# Patient Record
Sex: Male | Born: 1940 | Race: White | Hispanic: No | Smoking: Former smoker
Health system: Southern US, Community
[De-identification: ages and names within clinical notes are randomized; demographics above are authoritative.]

## PROBLEM LIST (undated history)

## (undated) DIAGNOSIS — M1990 Unspecified osteoarthritis, unspecified site: Secondary | ICD-10-CM

## (undated) DIAGNOSIS — C3491 Malignant neoplasm of unspecified part of right bronchus or lung: Secondary | ICD-10-CM

## (undated) DIAGNOSIS — J449 Chronic obstructive pulmonary disease, unspecified: Secondary | ICD-10-CM

## (undated) DIAGNOSIS — I493 Ventricular premature depolarization: Secondary | ICD-10-CM

## (undated) DIAGNOSIS — E785 Hyperlipidemia, unspecified: Secondary | ICD-10-CM

## (undated) DIAGNOSIS — Z923 Personal history of irradiation: Secondary | ICD-10-CM

## (undated) DIAGNOSIS — I2510 Atherosclerotic heart disease of native coronary artery without angina pectoris: Secondary | ICD-10-CM

## (undated) DIAGNOSIS — I10 Essential (primary) hypertension: Secondary | ICD-10-CM

## (undated) DIAGNOSIS — K219 Gastro-esophageal reflux disease without esophagitis: Secondary | ICD-10-CM

## (undated) DIAGNOSIS — J45909 Unspecified asthma, uncomplicated: Secondary | ICD-10-CM

## (undated) DIAGNOSIS — I509 Heart failure, unspecified: Secondary | ICD-10-CM

## (undated) DIAGNOSIS — Z7189 Other specified counseling: Secondary | ICD-10-CM

## (undated) HISTORY — DX: Heart failure, unspecified: I50.9

## (undated) HISTORY — PX: EXPLORATORY LAPAROTOMY: SUR591

## (undated) HISTORY — DX: Other specified counseling: Z71.89

## (undated) HISTORY — DX: Essential (primary) hypertension: I10

## (undated) HISTORY — DX: Personal history of irradiation: Z92.3

## (undated) HISTORY — PX: HERNIA REPAIR: SHX51

## (undated) HISTORY — DX: Malignant neoplasm of unspecified part of right bronchus or lung: C34.91

## (undated) HISTORY — PX: JOINT REPLACEMENT: SHX530

## (undated) HISTORY — DX: Gastro-esophageal reflux disease without esophagitis: K21.9

## (undated) HISTORY — DX: Hyperlipidemia, unspecified: E78.5

## (undated) HISTORY — DX: Chronic obstructive pulmonary disease, unspecified: J44.9

## (undated) HISTORY — PX: EYE SURGERY: SHX253

---

## 2002-04-28 ENCOUNTER — Encounter: Admission: RE | Admit: 2002-04-28 | Discharge: 2002-04-28 | Payer: Self-pay | Admitting: Family Medicine

## 2002-04-28 ENCOUNTER — Ambulatory Visit (HOSPITAL_COMMUNITY): Admission: RE | Admit: 2002-04-28 | Discharge: 2002-04-28 | Payer: Self-pay | Admitting: Family Medicine

## 2002-06-01 ENCOUNTER — Encounter: Admission: RE | Admit: 2002-06-01 | Discharge: 2002-06-01 | Payer: Self-pay | Admitting: Family Medicine

## 2002-08-04 ENCOUNTER — Encounter: Admission: RE | Admit: 2002-08-04 | Discharge: 2002-08-04 | Payer: Self-pay | Admitting: *Deleted

## 2002-09-07 ENCOUNTER — Encounter: Admission: RE | Admit: 2002-09-07 | Discharge: 2002-09-07 | Payer: Self-pay | Admitting: Family Medicine

## 2002-09-26 ENCOUNTER — Encounter: Payer: Self-pay | Admitting: Sports Medicine

## 2002-09-26 ENCOUNTER — Encounter: Admission: RE | Admit: 2002-09-26 | Discharge: 2002-09-26 | Payer: Self-pay | Admitting: Sports Medicine

## 2002-10-11 ENCOUNTER — Encounter: Admission: RE | Admit: 2002-10-11 | Discharge: 2002-10-11 | Payer: Self-pay | Admitting: Sports Medicine

## 2002-11-01 ENCOUNTER — Encounter: Admission: RE | Admit: 2002-11-01 | Discharge: 2002-11-01 | Payer: Self-pay | Admitting: Sports Medicine

## 2002-11-29 ENCOUNTER — Encounter: Admission: RE | Admit: 2002-11-29 | Discharge: 2002-11-29 | Payer: Self-pay | Admitting: Sports Medicine

## 2002-12-28 ENCOUNTER — Encounter: Admission: RE | Admit: 2002-12-28 | Discharge: 2002-12-28 | Payer: Self-pay | Admitting: Family Medicine

## 2003-03-20 ENCOUNTER — Encounter: Admission: RE | Admit: 2003-03-20 | Discharge: 2003-03-20 | Payer: Self-pay | Admitting: Family Medicine

## 2003-04-20 ENCOUNTER — Encounter: Admission: RE | Admit: 2003-04-20 | Discharge: 2003-04-20 | Payer: Self-pay | Admitting: Sports Medicine

## 2003-06-01 ENCOUNTER — Encounter: Admission: RE | Admit: 2003-06-01 | Discharge: 2003-06-01 | Payer: Self-pay | Admitting: Family Medicine

## 2003-06-06 ENCOUNTER — Encounter: Admission: RE | Admit: 2003-06-06 | Discharge: 2003-06-06 | Payer: Self-pay | Admitting: Sports Medicine

## 2003-07-04 ENCOUNTER — Encounter: Admission: RE | Admit: 2003-07-04 | Discharge: 2003-07-04 | Payer: Self-pay | Admitting: Family Medicine

## 2003-07-28 ENCOUNTER — Encounter: Admission: RE | Admit: 2003-07-28 | Discharge: 2003-07-28 | Payer: Self-pay | Admitting: Sports Medicine

## 2003-09-18 ENCOUNTER — Encounter: Admission: RE | Admit: 2003-09-18 | Discharge: 2003-09-18 | Payer: Self-pay | Admitting: Sports Medicine

## 2004-01-03 ENCOUNTER — Ambulatory Visit: Payer: Self-pay | Admitting: Family Medicine

## 2004-03-01 ENCOUNTER — Ambulatory Visit: Payer: Self-pay | Admitting: Family Medicine

## 2004-04-16 ENCOUNTER — Ambulatory Visit: Payer: Self-pay | Admitting: Family Medicine

## 2004-05-15 ENCOUNTER — Ambulatory Visit: Payer: Self-pay | Admitting: Family Medicine

## 2004-05-29 ENCOUNTER — Ambulatory Visit: Payer: Self-pay | Admitting: Sports Medicine

## 2004-06-05 ENCOUNTER — Ambulatory Visit: Payer: Self-pay | Admitting: Family Medicine

## 2004-06-24 ENCOUNTER — Ambulatory Visit: Payer: Self-pay | Admitting: Family Medicine

## 2004-07-22 ENCOUNTER — Ambulatory Visit: Payer: Self-pay | Admitting: Family Medicine

## 2004-08-22 ENCOUNTER — Ambulatory Visit: Payer: Self-pay | Admitting: Family Medicine

## 2004-10-17 ENCOUNTER — Ambulatory Visit: Payer: Self-pay | Admitting: Family Medicine

## 2004-12-06 ENCOUNTER — Ambulatory Visit: Payer: Self-pay | Admitting: Family Medicine

## 2005-04-11 ENCOUNTER — Ambulatory Visit: Payer: Self-pay | Admitting: Family Medicine

## 2005-12-17 ENCOUNTER — Ambulatory Visit: Payer: Self-pay | Admitting: Family Medicine

## 2005-12-18 ENCOUNTER — Ambulatory Visit: Payer: Self-pay | Admitting: Sports Medicine

## 2006-02-04 ENCOUNTER — Ambulatory Visit: Payer: Self-pay | Admitting: Sports Medicine

## 2006-04-02 DIAGNOSIS — M1990 Unspecified osteoarthritis, unspecified site: Secondary | ICD-10-CM | POA: Insufficient documentation

## 2006-04-02 DIAGNOSIS — I10 Essential (primary) hypertension: Secondary | ICD-10-CM

## 2006-04-02 DIAGNOSIS — E669 Obesity, unspecified: Secondary | ICD-10-CM

## 2006-04-02 DIAGNOSIS — F5232 Male orgasmic disorder: Secondary | ICD-10-CM

## 2006-04-21 ENCOUNTER — Ambulatory Visit: Payer: Self-pay | Admitting: Family Medicine

## 2006-04-21 DIAGNOSIS — J449 Chronic obstructive pulmonary disease, unspecified: Secondary | ICD-10-CM

## 2006-04-23 ENCOUNTER — Encounter: Admission: RE | Admit: 2006-04-23 | Discharge: 2006-04-23 | Payer: Self-pay | Admitting: Sports Medicine

## 2006-04-27 ENCOUNTER — Encounter (INDEPENDENT_AMBULATORY_CARE_PROVIDER_SITE_OTHER): Payer: Self-pay | Admitting: *Deleted

## 2006-04-30 ENCOUNTER — Ambulatory Visit: Payer: Self-pay

## 2006-04-30 ENCOUNTER — Encounter (INDEPENDENT_AMBULATORY_CARE_PROVIDER_SITE_OTHER): Payer: Self-pay | Admitting: *Deleted

## 2006-05-01 ENCOUNTER — Ambulatory Visit (HOSPITAL_COMMUNITY): Admission: RE | Admit: 2006-05-01 | Discharge: 2006-05-01 | Payer: Self-pay | Admitting: *Deleted

## 2006-05-04 ENCOUNTER — Encounter (INDEPENDENT_AMBULATORY_CARE_PROVIDER_SITE_OTHER): Payer: Self-pay | Admitting: *Deleted

## 2006-05-07 ENCOUNTER — Telehealth (INDEPENDENT_AMBULATORY_CARE_PROVIDER_SITE_OTHER): Payer: Self-pay | Admitting: *Deleted

## 2006-05-14 ENCOUNTER — Telehealth (INDEPENDENT_AMBULATORY_CARE_PROVIDER_SITE_OTHER): Payer: Self-pay | Admitting: *Deleted

## 2006-06-01 ENCOUNTER — Ambulatory Visit (HOSPITAL_COMMUNITY): Admission: RE | Admit: 2006-06-01 | Discharge: 2006-06-01 | Payer: Self-pay | Admitting: Cardiovascular Disease

## 2006-09-04 ENCOUNTER — Ambulatory Visit: Payer: Self-pay | Admitting: Family Medicine

## 2006-09-04 ENCOUNTER — Encounter (INDEPENDENT_AMBULATORY_CARE_PROVIDER_SITE_OTHER): Payer: Self-pay | Admitting: *Deleted

## 2006-09-04 DIAGNOSIS — I2510 Atherosclerotic heart disease of native coronary artery without angina pectoris: Secondary | ICD-10-CM

## 2006-09-07 LAB — CONVERTED CEMR LAB
BUN: 18 mg/dL (ref 6–23)
Chloride: 96 meq/L (ref 96–112)
Sodium: 134 meq/L — ABNORMAL LOW (ref 135–145)

## 2006-09-10 ENCOUNTER — Encounter (INDEPENDENT_AMBULATORY_CARE_PROVIDER_SITE_OTHER): Payer: Self-pay | Admitting: *Deleted

## 2006-09-11 ENCOUNTER — Encounter (INDEPENDENT_AMBULATORY_CARE_PROVIDER_SITE_OTHER): Payer: Self-pay | Admitting: *Deleted

## 2006-10-19 ENCOUNTER — Inpatient Hospital Stay (HOSPITAL_COMMUNITY): Admission: RE | Admit: 2006-10-19 | Discharge: 2006-10-21 | Payer: Self-pay | Admitting: Orthopedic Surgery

## 2006-12-03 ENCOUNTER — Ambulatory Visit: Payer: Self-pay | Admitting: Family Medicine

## 2006-12-03 DIAGNOSIS — I5022 Chronic systolic (congestive) heart failure: Secondary | ICD-10-CM

## 2007-03-10 ENCOUNTER — Telehealth (INDEPENDENT_AMBULATORY_CARE_PROVIDER_SITE_OTHER): Payer: Self-pay | Admitting: *Deleted

## 2007-03-26 ENCOUNTER — Ambulatory Visit: Payer: Self-pay | Admitting: Family Medicine

## 2007-03-26 DIAGNOSIS — E782 Mixed hyperlipidemia: Secondary | ICD-10-CM

## 2007-03-29 ENCOUNTER — Encounter (INDEPENDENT_AMBULATORY_CARE_PROVIDER_SITE_OTHER): Payer: Self-pay | Admitting: *Deleted

## 2007-03-29 ENCOUNTER — Ambulatory Visit: Payer: Self-pay | Admitting: Sports Medicine

## 2007-03-29 LAB — CONVERTED CEMR LAB
ALT: 15 units/L (ref 0–53)
AST: 14 units/L (ref 0–37)
CO2: 26 meq/L (ref 19–32)
Chloride: 101 meq/L (ref 96–112)
Creatinine, Ser: 0.98 mg/dL (ref 0.40–1.50)
LDL Cholesterol: 77 mg/dL (ref 0–99)
PSA: 1.06 ng/mL (ref 0.10–4.00)
Sodium: 140 meq/L (ref 135–145)
Total Bilirubin: 0.5 mg/dL (ref 0.3–1.2)
Total CHOL/HDL Ratio: 3.5
Total Protein: 6.7 g/dL (ref 6.0–8.3)
VLDL: 24 mg/dL (ref 0–40)

## 2007-03-30 ENCOUNTER — Encounter (INDEPENDENT_AMBULATORY_CARE_PROVIDER_SITE_OTHER): Payer: Self-pay | Admitting: *Deleted

## 2007-07-20 ENCOUNTER — Ambulatory Visit: Payer: Self-pay | Admitting: Family Medicine

## 2007-08-18 ENCOUNTER — Telehealth: Payer: Self-pay | Admitting: Family Medicine

## 2007-11-30 ENCOUNTER — Telehealth (INDEPENDENT_AMBULATORY_CARE_PROVIDER_SITE_OTHER): Payer: Self-pay | Admitting: *Deleted

## 2008-01-03 ENCOUNTER — Telehealth: Payer: Self-pay | Admitting: Family Medicine

## 2008-01-03 ENCOUNTER — Ambulatory Visit: Payer: Self-pay | Admitting: Family Medicine

## 2008-01-06 ENCOUNTER — Encounter: Payer: Self-pay | Admitting: Family Medicine

## 2008-03-22 ENCOUNTER — Encounter: Payer: Self-pay | Admitting: Family Medicine

## 2008-04-28 ENCOUNTER — Telehealth: Payer: Self-pay | Admitting: *Deleted

## 2008-05-02 ENCOUNTER — Ambulatory Visit: Payer: Self-pay | Admitting: Family Medicine

## 2008-05-03 ENCOUNTER — Ambulatory Visit: Payer: Self-pay | Admitting: Family Medicine

## 2008-05-03 ENCOUNTER — Encounter: Payer: Self-pay | Admitting: Family Medicine

## 2008-05-03 LAB — CONVERTED CEMR LAB
ALT: 19 units/L (ref 0–53)
AST: 17 units/L (ref 0–37)
Albumin: 4 g/dL (ref 3.5–5.2)
Alkaline Phosphatase: 73 units/L (ref 39–117)
LDL Cholesterol: 79 mg/dL (ref 0–99)
Potassium: 4.4 meq/L (ref 3.5–5.3)
Sodium: 139 meq/L (ref 135–145)
Total Bilirubin: 0.6 mg/dL (ref 0.3–1.2)
Total Protein: 6.4 g/dL (ref 6.0–8.3)
Triglycerides: 105 mg/dL (ref <150)
VLDL: 21 mg/dL (ref 0–40)

## 2008-06-23 ENCOUNTER — Ambulatory Visit: Payer: Self-pay | Admitting: Family Medicine

## 2008-06-30 ENCOUNTER — Telehealth: Payer: Self-pay | Admitting: *Deleted

## 2008-07-19 ENCOUNTER — Telehealth (INDEPENDENT_AMBULATORY_CARE_PROVIDER_SITE_OTHER): Payer: Self-pay | Admitting: *Deleted

## 2008-07-20 ENCOUNTER — Encounter: Payer: Self-pay | Admitting: Family Medicine

## 2008-07-20 ENCOUNTER — Ambulatory Visit: Payer: Self-pay

## 2008-08-02 ENCOUNTER — Telehealth: Payer: Self-pay | Admitting: Family Medicine

## 2008-08-03 ENCOUNTER — Encounter: Payer: Self-pay | Admitting: Family Medicine

## 2008-08-29 ENCOUNTER — Ambulatory Visit: Payer: Self-pay | Admitting: Family Medicine

## 2008-09-25 ENCOUNTER — Inpatient Hospital Stay (HOSPITAL_COMMUNITY): Admission: RE | Admit: 2008-09-25 | Discharge: 2008-09-27 | Payer: Self-pay | Admitting: Orthopedic Surgery

## 2008-09-28 ENCOUNTER — Telehealth: Payer: Self-pay | Admitting: Family Medicine

## 2009-01-16 ENCOUNTER — Ambulatory Visit: Payer: Self-pay | Admitting: Family Medicine

## 2009-01-19 ENCOUNTER — Telehealth: Payer: Self-pay | Admitting: Family Medicine

## 2009-01-22 ENCOUNTER — Telehealth: Payer: Self-pay | Admitting: Family Medicine

## 2009-04-03 ENCOUNTER — Encounter: Payer: Self-pay | Admitting: Family Medicine

## 2009-10-24 ENCOUNTER — Encounter: Payer: Self-pay | Admitting: Family Medicine

## 2009-11-09 ENCOUNTER — Ambulatory Visit: Payer: Self-pay | Admitting: Family Medicine

## 2009-11-09 ENCOUNTER — Encounter: Payer: Self-pay | Admitting: Family Medicine

## 2009-11-09 LAB — CONVERTED CEMR LAB
ALT: 13 units/L (ref 0–53)
AST: 18 units/L (ref 0–37)
CO2: 26 meq/L (ref 19–32)
Chloride: 95 meq/L — ABNORMAL LOW (ref 96–112)
Creatinine, Ser: 0.77 mg/dL (ref 0.40–1.50)
HDL: 45 mg/dL (ref >39)
Indirect Bilirubin: 0.4 mg/dL (ref 0.0–0.9)
LDL Cholesterol: 64 mg/dL (ref 0–99)
Potassium: 4.1 meq/L (ref 3.5–5.3)
Sodium: 130 meq/L — ABNORMAL LOW (ref 135–145)
Total CHOL/HDL Ratio: 2.7
Triglycerides: 60 mg/dL (ref <150)

## 2009-11-12 ENCOUNTER — Encounter: Payer: Self-pay | Admitting: Family Medicine

## 2009-12-07 ENCOUNTER — Encounter: Payer: Self-pay | Admitting: Family Medicine

## 2010-01-22 ENCOUNTER — Encounter: Payer: Self-pay | Admitting: Family Medicine

## 2010-02-13 ENCOUNTER — Ambulatory Visit: Admission: RE | Admit: 2010-02-13 | Discharge: 2010-02-13 | Payer: Self-pay | Source: Home / Self Care

## 2010-02-26 ENCOUNTER — Encounter (INDEPENDENT_AMBULATORY_CARE_PROVIDER_SITE_OTHER): Payer: Self-pay | Admitting: *Deleted

## 2010-03-07 NOTE — Miscellaneous (Signed)
Summary: prior auth   Clinical Lists Changes prior auth for diclofenac to pcpp to complete.Golden Circle RN  April 03, 2009 8:34 AM

## 2010-03-07 NOTE — Miscellaneous (Signed)
Summary: handicapped placard  Patient dropped off form to be filled out for a handicapped placard.  Please call him when ready to be picked up. Bradley Meadows  January 22, 2010 10:18 AM   Handicapped Placard completed and placed in Dr. Lelon Perla box for signature.  [name]  January 22, 2010 10:30 AM  Please have patient come in for an office visit to discuss the handicapped placard.  I have some questions for him to make sure that he needs it.  Angelena Sole MD  January 31, 2010 5:22 PM   pt scheduled on 02/13/10 at 2:30

## 2010-03-07 NOTE — Miscellaneous (Signed)
   Clinical Lists Changes  Problems: Removed problem of CELLULITIS, LEG, RIGHT (ICD-682.6) Removed problem of SPECIAL SCREENING MALIGNANT NEOPLASM OF PROSTATE (ICD-V76.44) Removed problem of DUODENAL ULCER (ICD-532.90) Removed problem of ARTHRALGIA, UNSPECIFIED (ICD-719.40)

## 2010-03-07 NOTE — Assessment & Plan Note (Signed)
Summary: meds f/u,df   Vital Signs:  Patient profile:   70 year old male Height:      72 inches Weight:      193.4 pounds BMI:     26.32 Temp:     98.3 degrees F oral Pulse rate:   80 / minute BP sitting:   144 / 73  (left arm) Cuff size:   regular  Vitals Entered By: Garen Grams LPN (November 09, 2009 1:57 PM) CC: f/u meds Is Patient Diabetic? No Pain Assessment Patient in pain? no        CC:  f/u meds.  History of Present Illness: 1. HTN:  Pt is taking his medicines as prescribed.  He has been out of the Metoprolol for a couple of weeks.  ROS: denies chest pain, shortness of breath  2. Obesity:  He has been trying to lose weight and has lost 80 lbs in the past 17 months.  He has cut out sugary drinks, junk foods, and processed foods.  Trying to walk more.  3. Osteoarthritis:  He had left knee replacement recently.  It is doing well.  Able to do everything that he wants to do.  Still requiring Hydrocodone because of the pain.  4. HLD: Taking the Zocor as directed  ROS: denies leg pain or claudicatino  Habits & Providers  Alcohol-Tobacco-Diet     Tobacco Status: never     Year Quit: 2004     Pack years: 84  Current Medications (verified): 1)  Combivent 103-18 Mcg/act Aero (Albuterol-Ipratropium) .... Inhale 2 Puff Using Inhaler Four Times A Day 2)  Lisinopril-Hydrochlorothiazide 20-12.5 Mg Tabs (Lisinopril-Hydrochlorothiazide) .... Take 1 Tablet By Twice A Day 3)  Prilosec 20 Mg Cpdr (Omeprazole) .... Take 1 Capsule By Mouth Once A Day 4)  Aspir-Low 81 Mg Tbec (Aspirin) .... One Tab By Mouth Daily 5)  Metoprolol Tartrate 25 Mg  Tabs (Metoprolol Tartrate) .... One Tab By Mouth Bid 6)  Zocor 20 Mg Tabs (Simvastatin) .... One Tab By Mouth Daily 7)  Zostavax 21308 Unt/0.76ml Solr (Zoster Vaccine Live) .... Please Bring To Clinic For Injection 8)  Hydrocodone-Acetaminophen 5-500 Mg Tabs (Hydrocodone-Acetaminophen) .[name].. 1 Tab By Mouth Two Times A Day As Needed For  Pain 9)  Viagra 25 Mg Tabs (Sildenafil Citrate) .... Take 1 Tab By Mouth As Needed  Allergies: No Known Drug Allergies  Past History:  Past Medical History: Reviewed history from 01/16/2009 and no changes required. h/o scarlet fever as a child h/o smoking x 20 years.  Quit in 2004 CHF: EF 45% (07/10)  Physical Exam  General:  Vitals reviewed.  No acute distress.  Well appearing Eyes:  Fundoscopic exam benign Lungs:  normal respiratory effort, easily winded, no crackles, and no wheezes.   Heart:  normal rate and regular rhythm.   Msk:  Left knee:  good stability.  Good ROM  Right knee:  good stability.  Good ROM   Extremities:  no LEE   Impression & Recommendations:  Problem # 1:  HYPERTENSION, BENIGN SYSTEMIC (ICD-401.1) Assessment Deteriorated  Not at goal but he has been out of the Metoprolol.  Will restart that and monitor. His updated medication list for this problem includes:    Lisinopril-hydrochlorothiazide 20-12.5 Mg Tabs (Lisinopril-hydrochlorothiazide) .[name]... Take 1 tablet by twice a day    Metoprolol Tartrate 25 Mg Tabs (Metoprolol tartrate) ..... One tab by mouth bid  Orders: T-Basic Metabolic Panel [phone]) [facility]- Est  Level 4 ([address])  Problem # 2:  OBESITY, NOS (ICD-278.00) Assessment: Improved  He is now in a normal weight.  Congratulated him on his success.  Continue with the dietary and exercise changes  Orders: [facility]- Est  Level 4 ([address])  Problem # 3:  MIXED HYPERLIPIDEMIA (ICD-272.2) Assessment: Unchanged check lipids today His updated medication list for this problem includes:    Zocor 20 Mg Tabs (Simvastatin) ..... 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  Now with shoulder pain.  Vicodin per Ortho. The following medications were removed from the medication list:    Voltaren-xr 100 Mg Tb24  (Diclofenac sodium) .[name]... Take 1 tablet by mouth once a day His updated medication list for this problem includes:    Aspir-low 81 Mg Tbec (Aspirin) ..... One tab by mouth daily    Hydrocodone-acetaminophen 5-500 Mg Tabs (Hydrocodone-acetaminophen) .[name]... 1 tab by mouth two times a day as needed for pain  Orders: [facility]- Est  Level 4 ([address])  Complete Medication List: 1)  Combivent 103-18 Mcg/act Aero (Albuterol-ipratropium) .... Inhale 2 puff using inhaler four times a day 2)  Lisinopril-hydrochlorothiazide 20-12.5 Mg Tabs (Lisinopril-hydrochlorothiazide) .... Take 1 tablet by twice a day 3)  Prilosec 20 Mg Cpdr (Omeprazole) .... Take 1 capsule by mouth once a day 4)  Aspir-low 81 Mg Tbec (Aspirin) .... One tab by mouth daily 5)  Metoprolol Tartrate 25 Mg Tabs (Metoprolol tartrate) .... One tab by mouth bid 6)  Zocor 20 Mg Tabs (Simvastatin) .... One tab by mouth daily 7)  Zostavax 40347 Unt/0.17ml Solr (Zoster vaccine live) .... Please bring to clinic for injection 8)  Hydrocodone-acetaminophen 5-500 Mg Tabs (Hydrocodone-acetaminophen) .[name].. 1 tab by mouth two times a day as needed for pain 9)  Viagra 25 Mg Tabs (Sildenafil citrate) .... Take 1 tab by mouth as needed  Other Orders: T-Hepatic Function [id number]) Flu Vaccine 76yrs + [phone]) Admin 1st Vaccine (95188) Admin 1st Vaccine [facility]) [phone]) Prescriptions: METOPROLOL TARTRATE 25 MG  TABS (METOPROLOL TARTRATE) one tab by mouth BID  #180 x 3   Entered and Authorized by:   Angelena Sole MD   Signed by:   Angelena Sole MD on 11/09/2009   Method used:   Electronically to        [facility] [id number]* (retail)       [address]       [address]       [place], [place]  [address]       Ph: [phone]       Fax: [phone]   RxID:   [id number]   Prevention & Chronic Care Immunizations   Influenza vaccine: Fluvax 3+  (11/09/2009)   Influenza vaccine due: 12/03/2007    Tetanus booster: 05/05/2002: Done.    Tetanus booster due: 05/04/2012    Pneumococcal vaccine: given  (10/20/2006)   Pneumococcal vaccine due: None    H. zoster vaccine: Not documented  Colorectal Screening   Hemoccult: negative x 3  (04/29/2006)   Hemoccult due: 04/29/2007    Colonoscopy: refused  (01/03/2008)   Colonoscopy due: Refused  (01/03/2008)  Other Screening   PSA: 1.06  (03/29/2007)   PSA due due: 03/28/2008   Smoking status: never  (11/09/2009)  Lipids   Total Cholesterol: 135  (05/03/2008)   Lipid panel action/deferral: Lipid Panel ordered   LDL: 79  (05/03/2008)   LDL Direct: 119  (09/04/2006)  HDL: 35  (05/03/2008)   Triglycerides: 105  (05/03/2008)    SGOT (AST): 17  (05/03/2008)   BMP action: Ordered   SGPT (ALT): 19  (05/03/2008)   Alkaline phosphatase: 73  (05/03/2008)   Total bilirubin: 0.6  (05/03/2008)    Lipid flowsheet reviewed?: Yes   Progress toward LDL goal: Unchanged  Hypertension   Last Blood Pressure: 144 / 73  (11/09/2009)   Serum creatinine: 1.03  (05/03/2008)   BMP action: Ordered   Serum potassium 4.4  (05/03/2008)    Hypertension flowsheet reviewed?: Yes   Progress toward BP goal: Unchanged  Self-Management Support :   Personal Goals (by the next clinic visit) :      Personal blood pressure goal: 140/90  (11/09/2009)     Personal LDL goal: 130  (11/09/2009)    Hypertension self-management support: Not documented    Lipid self-management support: Not documented    Nursing Instructions: Give Flu vaccine today     Influenza Vaccine    Vaccine Type: Fluvax 3+    Site: left deltoid    Mfr: GlaxoSmithKline    Dose: 0.5 ml    Route: IM    Given by: Garen Grams LPN    Exp. Date: 07/31/2010    Lot #: [id number]    VIS given: 08/28/09 version given November 09, 2009.  Flu Vaccine Consent Questions    Do you have a history of severe allergic reactions to this vaccine? no    Any prior history of allergic reactions to egg and/or gelatin? no    Do you have  a sensitivity to the preservative Thimersol? no    Do you have a past history of Guillan-Barre Syndrome? no    Do you currently have an acute febrile illness? no    Have you ever had a severe reaction to latex? no    Vaccine information given and explained to patient? yes

## 2010-03-07 NOTE — Letter (Signed)
Summary: Generic Letter  [facility]  [address]   [place], [place] [address]   Phone: [phone]  Fax: [phone]    02/26/2010  [address] [place], [place]  [address]  Dear Bradley Meadows,  We are happy to let you know that since you are covered under Medicare you are able to have a FREE visit at the [facility] to discuss your HEALTH. This is a new benefit for Medicare.  There will be no co-payment.  At this visit you will meet with Arlys John an expert in wellness and the health coach at our clinic.  At this visit we will discuss ways to keep you healthy and feeling well.  This visit will not replace your regular doctor visit and we cannot refill medications.     You will need to plan to be here at least one hour to talk about your medical history, your current status, review all of your medications, and discuss your future plans for your health.  This information will be entered into your record for your doctor to have and review.  If you are interested in staying healthy, this type of visit can help.  Please call the office at: [phone], to schedule a "Medicare Wellness Visit".  The day of the visit you should bring in all of your medications, including any vitamins, herbs, over the counter products you take.  Make a list of all the other doctors that you see, so we know who they are. If you have any other health documents please bring them.  We look forward to helping you stay healthy.  Sincerely,   [facility]  iAWV

## 2010-03-07 NOTE — Letter (Signed)
Summary: Generic Letter  [facility]  [address]   [place], [place] [address]   Phone: [phone]  Fax: [phone]    11/12/2009  Bradley Meadows [address] [place], [place]  [address]  Dear [name],  Here is a copy of your lab results.  Everything looked pretty good.  The sodium and cholride are nothing to worry about.  Your cholesterol looked great.  Your liver enzymes were also normal  Tests: (1) Basic Metabolic Panel ([address])   Sodium               [L]  130 mEq/L                   135-145   Potassium                 4.1 mEq/L                   3.5-5.3   Chloride             [L]  95 mEq/L                    96-112   CO2                       26 mEq/L                    19-32   Glucose              [H]  100 mg/dL                   10-27   BUN                       13 mg/dL                    2-53   Creatinine                0.77 mg/dL                  0.40-1.50   Calcium                   9.9 mg/dL                   6.6-44.0  Tests: (2) Lipid Profile (34742)   Cholesterol               121 mg/dL                   5-956   Triglyceride              60 mg/dL                    <387   HDL Cholesterol           45 mg/dL                    >56   Total Chol/HDL Ratio      2.7 Ratio  VLDL Cholesterol (Calc)    12 mg/dL                    4-33  LDL Cholesterol (Calc)     64 mg/dL  0-99           Tests: (3) Liver Profile (46962)   Bilirubin, Total          0.5 mg/dL                   9.5-2.8   Bilirubin, Direct         0.1 mg/dL                   4.1-3.2   Indirect Bilirubin        0.4 mg/dL                   4.4-0.1   Alkaline Phosphatase      67 U/L                      39-117   AST/SGOT                  18 U/L                      0-37   ALT/SGPT                  13 U/L                      0-53   Total Protein             6.3 g/dL                    0.2-7.2   Albumin                   4.3 g/dL                     5.3-6.6   Sincerely,   Angelena Sole MD  Appended Document: Generic Letter mailed

## 2010-03-07 NOTE — Miscellaneous (Signed)
  Clinical Lists Changes  Problems: Changed problem from CHF, MILD (ICD-428.0) to CHRONIC SYSTOLIC HEART FAILURE (ICD-428.22) 

## 2010-03-07 NOTE — Assessment & Plan Note (Signed)
Summary: handicap placard eval/eo   Vital Signs:  Patient profile:   70 year old male Height:      72 inches Weight:      194.7 pounds BMI:     26.50 Temp:     97.7 degrees F oral Pulse rate:   73 / minute BP sitting:   122 / 67  (left arm) Cuff size:   regular  Vitals Entered By: Garen Grams LPN (February 13, 2010 2:43 PM) CC: COPD, arthritis Is Patient Diabetic? No Pain Assessment Patient in pain? no        CC:  COPD and arthritis.  History of Present Illness: 1. COPD:  Pt has long standing COPD.  It has been stable over the past couple of years.  However, over the winter it has been a little bit worse.  He has been using a wood stove to heat his house and he thinks that it has been irritating his lungs.  He has had to use his inhalers more frequently.  He does better when he is not in the house.  ROS: denies shortness of breath currently, chest pain  2. Arthritis:  He has arthritis in multiple joints.  He is s/p bilateral knee replacement.  His main problems is with his hands.  He wakes up and his hands are very stiff and sore.  He has been taking Aleve as needed.  ROS: denies any warm or swollen joints  3. CHF:  Pt was seeing Dr. Judie Grieve but has tried to switch to [facility] but it has been difficult getting an appointment over there.  He is taking his medicines as prescribed.    ROS: denies leg swelling, increased weight  Habits & Providers  Alcohol-Tobacco-Diet     Tobacco Status: never     Year Quit: 2004     Pack years: 42  Current Medications (verified): 1)  Combivent 103-18 Mcg/act Aero (Albuterol-Ipratropium) .... Inhale 2 Puff Using Inhaler Four Times A Day 2)  Lisinopril-Hydrochlorothiazide 20-12.5 Mg Tabs (Lisinopril-Hydrochlorothiazide) .... Take 1 Tablet By Twice A Day 3)  Prilosec 20 Mg Cpdr (Omeprazole) .... Take 1 Capsule By Mouth Once A Day 4)  Aspir-Low 81 Mg Tbec (Aspirin) .... One Tab By Mouth Daily 5)  Metoprolol Tartrate 25 Mg  Tabs (Metoprolol  Tartrate) .... One Tab By Mouth Bid 6)  Zocor 20 Mg Tabs (Simvastatin) .... One Tab By Mouth Daily 7)  Zostavax 81191 Unt/0.40ml Solr (Zoster Vaccine Live) .... Please Bring To Clinic For Injection 8)  Hydrocodone-Acetaminophen 5-500 Mg Tabs (Hydrocodone-Acetaminophen) .[name].. 1 Tab By Mouth Two Times A Day As Needed For Pain 9)  Viagra 25 Mg Tabs (Sildenafil Citrate) .... Take 1 Tab By Mouth As Needed 10)  Tylenol Ex St Arthritis Pain 500 Mg Tabs (Acetaminophen) .[name].. 1 Tab By Mouth Twice A Day 11)  Fish Oil 1000 Mg Caps (Omega-3 Fatty Acids) .... 2 Tabs By Mouth Twice Daily  Allergies: No Known Drug Allergies  Past History:  Past Medical History: Reviewed history from 01/16/2009 and no changes required. h/o scarlet fever as a child h/o smoking x 20 years.  Quit in 2004 CHF: EF 45% (07/10)  Family History: Reviewed history from 01/03/2008 and no changes required. Mother died at the age of 37 with COPD.  Father died at 24 with alcoholic complications.  Has 2 sisters (64,60) one with hypothyroidism.  No other significant family history.   No Hx. of Colon cancer.  Social History: Reviewed history from 01/16/2009 and no changes  required. Used to work as a Environmental education officer.  Does not work currently-Disabled.  Lives alone in [place], divorced.  3 children- one girl and two boys.  Tobacco-60 pack year history quit 03/2002.  H/o heavy Etoh-quit 20 years ago.  Physical Exam  General:  Vitals reviewed.  No acute distress.  Well appearing Neck:  no JVD Lungs:  normal respiratory effort, easily winded, no crackles, and no wheezes.   Heart:  normal rate and regular rhythm.   Msk:  Left knee:  good stability.  Good ROM Right knee:  good stability.  Good ROM Bilateral hands:  Arthritic changes in MCP and PIP joints.   Extremities:  no LEE Psych:  not anxious appearing and not depressed appearing.     Impression & Recommendations:  Problem # 1:  COPD (ICD-496) Assessment  Deteriorated  Slightly deteriorated these past couple of weeks.  Likely related to his use of the wood stove.  Advised for him to avoid direct exposure to the stove as much as possible.  Continue Combivent as needed. His updated medication list for this problem includes:    Combivent 103-18 Mcg/act Aero (Albuterol-ipratropium) ..... 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  Continue current medications. His updated medication list for this problem includes:    Lisinopril-hydrochlorothiazide 20-12.5 Mg Tabs (Lisinopril-hydrochlorothiazide) .[name]... Take 1 tablet by twice a day    Aspir-low 81 Mg Tbec (Aspirin) ..... One tab by mouth daily    Metoprolol Tartrate 25 Mg Tabs (Metoprolol tartrate) ..... 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  Advised Tylenol twice daily with Aleve for breakthrough pain.  Also advised starting a fish oil. His updated medication list for this problem includes:    Aspir-low 81 Mg Tbec (Aspirin) ..... One tab by mouth daily    Hydrocodone-acetaminophen 5-500 Mg Tabs (Hydrocodone-acetaminophen) .[name]... 1 tab by mouth two times a day as needed for pain    Tylenol Ex St Arthritis Pain 500 Mg Tabs (Acetaminophen) .[name]... 1 tab by mouth twice a day  Orders: [facility]- Est  Level 4 ([address])  Complete Medication List: 1)  Combivent 103-18 Mcg/act Aero (Albuterol-ipratropium) .... Inhale 2 puff using inhaler four times a day 2)  Lisinopril-hydrochlorothiazide 20-12.5 Mg Tabs (Lisinopril-hydrochlorothiazide) .... Take 1 tablet by twice a day 3)  Prilosec 20 Mg Cpdr (Omeprazole) .... Take 1 capsule by mouth once a day 4)  Aspir-low 81 Mg Tbec (Aspirin) .... One tab by mouth daily 5)  Metoprolol Tartrate 25 Mg  Tabs (Metoprolol tartrate) .... One tab by mouth bid 6)  Zocor 20 Mg Tabs (Simvastatin) .... One tab by mouth daily 7)  Zostavax 60454 Unt/0.10ml Solr (Zoster vaccine live) .... Please bring to clinic for injection 8)  Hydrocodone-acetaminophen 5-500 Mg Tabs (Hydrocodone-acetaminophen) .[name].. 1 tab by mouth two times a day as needed for pain 9)  Viagra 25 Mg Tabs (Sildenafil citrate) .... Take 1 tab by mouth as needed 10)  Tylenol Ex St Arthritis Pain 500 Mg Tabs (Acetaminophen) .[name].. 1 tab by mouth twice a day 11)  Fish Oil 1000 Mg Caps (Omega-3 fatty acids) .... 2 tabs by mouth twice daily  Patient Instructions: 1)  It was good to see you today  2)  I am going to recommend you take Tylenol twice a day everyday for arthritis 3)  I also want you to start taking Fish Oil (2 caps twice daily) 4)  Please schedule a follow up appointment in 6 months   Orders Added: 1)  [facility]- Est  Level 4 [[address]]

## 2010-05-11 LAB — BASIC METABOLIC PANEL
CO2: 27 mEq/L (ref 19–32)
CO2: 28 mEq/L (ref 19–32)
CO2: 28 mEq/L (ref 19–32)
Calcium: 9.5 mg/dL (ref 8.4–10.5)
Chloride: 87 mEq/L — ABNORMAL LOW (ref 96–112)
Chloride: 93 mEq/L — ABNORMAL LOW (ref 96–112)
Creatinine, Ser: 0.86 mg/dL (ref 0.4–1.5)
GFR calc Af Amer: >60 mL/min (ref >60)
GFR calc non Af Amer: >60 mL/min (ref >60)
Glucose, Bld: 115 mg/dL — ABNORMAL HIGH (ref 70–99)
Glucose, Bld: 148 mg/dL — ABNORMAL HIGH (ref 70–99)
Potassium: 3.8 mEq/L (ref 3.5–5.1)
Potassium: 4 mEq/L (ref 3.5–5.1)
Sodium: 127 mEq/L — ABNORMAL LOW (ref 135–145)
Sodium: 128 mEq/L — ABNORMAL LOW (ref 135–145)

## 2010-05-11 LAB — CBC
HCT: 27.8 % — ABNORMAL LOW (ref 39.0–52.0)
HCT: 32.5 % — ABNORMAL LOW (ref 39.0–52.0)
Hemoglobin: 11.4 g/dL — ABNORMAL LOW (ref 13.0–17.0)
Hemoglobin: 13.2 g/dL (ref 13.0–17.0)
Hemoglobin: 9.7 g/dL — ABNORMAL LOW (ref 13.0–17.0)
MCHC: 34.7 g/dL (ref 30.0–36.0)
MCHC: 34.8 g/dL (ref 30.0–36.0)
MCHC: 35 g/dL (ref 30.0–36.0)
MCV: 91.6 fL (ref 78.0–100.0)
MCV: 92.1 fL (ref 78.0–100.0)
RBC: 3.04 MIL/uL — ABNORMAL LOW (ref 4.22–5.81)
RDW: 13.7 % (ref 11.5–15.5)
RDW: 13.7 % (ref 11.5–15.5)

## 2010-05-11 LAB — URINALYSIS, ROUTINE W REFLEX MICROSCOPIC
Bilirubin Urine: NEGATIVE
Hgb urine dipstick: NEGATIVE
Protein, ur: NEGATIVE mg/dL
Urobilinogen, UA: 1 mg/dL (ref 0.0–1.0)

## 2010-05-11 LAB — DIFFERENTIAL
Basophils Relative: 1 % (ref 0–1)
Eosinophils Absolute: 0.2 10*3/uL (ref 0.0–0.7)
Neutrophils Relative %: 64 % (ref 43–77)

## 2010-05-11 LAB — PROTIME-INR
INR: 1 (ref 0.00–1.49)
Prothrombin Time: 12.9 seconds (ref 11.6–15.2)
Prothrombin Time: 16.5 seconds — ABNORMAL HIGH (ref 11.6–15.2)

## 2010-06-18 NOTE — Op Note (Signed)
NAME:  Bradley Meadows, Bradley Meadows                  ACCOUNT NO.:  [id number]   MEDICAL RECORD NO.:  [id number]          PATIENT TYPE:  INP   LOCATION:  2550                         FACILITY:  [facility]   PHYSICIAN:  Feliberto Gottron. Turner Daniels, M.D.   DATE OF BIRTH:  29-Apr-1940   DATE OF PROCEDURE:  10/19/2006  DATE OF DISCHARGE:                               OPERATIVE REPORT   PREOPERATIVE DIAGNOSIS:  End-stage arthritis right knee with 5-10  degrees varus deformity, 15 degree flexion contracture and lateral  subluxation of the femur on top of the tibia.   POSTOPERATIVE DIAGNOSIS:  End-stage arthritis right knee with 5-10  degrees varus deformity, 15 degree flexion contracture and lateral  subluxation of the femur on top of the tibia.   PROCEDURE:  Right total knee arthroplasty using DePuy sigma RP  components,  5 tibial baseplate, 4 right femur, 12.5 posterior  stabilized bearing and a 41 mm patella button.  All components cemented  with a double batch of DePuy I cement with 1500 mg Zinacef.   SURGEON:  Feliberto Gottron.  Turner Daniels, MD.   ASSISTANT:  Skip Mayer PA-C.   ANESTHETIC:  General endotracheal anesthesia.   ESTIMATED BLOOD LOSS:  Minimal.   FLUID REPLACEMENT:  800 mL crystalloid.   TOURNIQUET TIME:  One hour and 30 minutes.   DRAINS PLACED:  Foley catheter and two medium Hemovacs.   URINE OUTPUT:  300 mL.   INDICATIONS FOR PROCEDURE:  A 70 year old man with longstanding end-  stage arthritis of the right knee who has failed conservative treatment  with anti-inflammatory medicine, narcotics, physical therapy, cortisone  injections and Hyalgan injections.  He still has a severe unremitting  pain that decreases function and limits his activities.  He desires  elective total knee arthroplasty, has bone-on-bone arthritic changes as  previously described.  Risks and benefits of surgery discussed,  questions answered.   DESCRIPTION OF PROCEDURE:  The patient underwent femoral nerve block  anesthesia to the  right thigh in the preop area, was then taken to the  operative suite at [facility] where the appropriate  anesthetic monitors were attached and general endotracheal anesthesia  induced with the patient in supine position.  Tourniquet was applied  high to the right thigh, lateral post foot positioner applied to the  table.  Limb was then prepped and draped in the usual sterile fashion  from the ankle to the tourniquet.  The limb was wrapped with an Esmarch  bandage, tourniquet inflated to 350 mmHg and began the procedure by  making a standard anterior midline incision starting one handbreadth  above the patella, going over the patella 1 cm medial, 2 and 3 cm distal  to the tibial tubercle.  Small bleeders in skin and subcutaneous tissue  identified and cauterized.  Transverse retinaculum was incised in line  with the skin incision and reflected medially, allowing medial  parapatellar arthrotomy, leaving a small cuff of tendon along the VMO  insertion.  The patella was everted.  The prepatellar fat pad resected.  More importantly, the superficial medial collateral ligament was  then  elevated off the proximal tibia from anterior to posterior leaving it  intact distally about 4-5 cm down the shaft of the tibia to loosen up  the medial side because of this far varus deformity.  The knee was then  hyperflexed exposing the everted patella and the huge osteophytes  medially and laterally with complete stenosis of the femoral notch.  The  stenosis and osteophytes were removed with half inch and three-quarter  inch osteotomes, greatly freeing up the knee.  With the knee hyperflexed  and the patella everted, we then entered the proximal tibia with the  DePuy step drill, followed by the IM rod set for a 0 degree cut.  This  was pinned in place, removing 2-3 mm of bone medially and a full  centimeter of bone laterally protecting posterior structures with a  lateral Homan,  posterior McHale through the notch and a posteromedial Z  retractor.  Once this had been accomplished, we further freed up the  posteromedial corner by recessing about 50% of the semimembranosus  insertion.  We then entered the distal femur 2 mm anterior to the PCL  origin with the intramedullary rod and a 5 degrees right distal femoral  cutting guide set for a 12 mm cut because of the flexion deformity.  This was pinned along the epicondylar axis and the distal femoral cut  accomplished.  We sized for a 4 right femoral component and pinned this  in 3 degrees of external rotation with the posterior referencing guide.  The chamfer cutting guide was then hammered into place, held in place  with towel clips.  Anterior, posterior and chamfer cuts were then  accomplished in order, followed by the box cut.  Again more osteophytes  were removed.  We were then able to elevate the distal femur anteriorly  with the IM rod and remove posterior osteophytes from the superior  aspect of the femoral condyles.  The patella itself was then measured at  22 mm, cutting guide set for 12 mm, we removed the posterior 10 mm of  the patella with a power saw, sized for 41 patellar trial and drilled  the trial.  At this point, the knee was once again hyperflexed exposing  the proximal tibia.  We sized for a #5 tibial baseplate.  This was  pinned in place, followed by the smokestack and then the conical  reaming, followed by the Delta fit keel punch with the bullet tip.  We  then hammered into place a 4 right femoral trial, drilled the lugs and  then performed trial reductions with a 10 mm and a 12.5 mm tibial  spacer.  The 12.5 had the best fit and fill, came to full extension and  flexed easily to 130 degrees limited by adipose tissue.  At this point,  the trial were components removed.  All bony surfaces were waterpiked  clean and dried with suction and sponges.  At the back table, a double  batch of DePuy I  polymethylmethacrylate cement with 1500 mg of Zinacef  was then mixed and applied to all mating surfaces except for the  posterior condyles of the femur itself.  In order, we hammered into  place a #5 tibial baseplate and removed excess cement, a 4 right femur  and removed excess cement and then snapped in the 12.5 rotating platform  bearing and squeezed into place the 41 mm patellar button and again  removed excess cement.  While the cement was curing, the  knee was held  in extension with axial pressure.  Medium Hemovac drains were placed  deep in the wound after the cement had cured.  The clamp was removed  from the patella.  The knee was taken through range of motion.  No  femoral pressure was needed for patellar tracking and the wound was once  again irrigated out with normal saline solution and pulse lavage.  We  then closed the parapatellar arthrotomy with running #1 Vicryl suture,  the subcutaneous tissue with 0 and 2-0 undyed Vicryl suture and the skin  with skin staples.  A dressing of Xeroform, 4x4 dressing sponges, Webril  and Ace wrap applied.  Tourniquet let down.  The patient awakened and  taken to the recovery room without difficulty.      Feliberto Gottron. Turner Daniels, M.D.  Electronically Signed     FJR/MEDQ  D:  10/19/2006  T:  10/19/2006  Job:  [id number]

## 2010-06-18 NOTE — Op Note (Signed)
Bradley Meadows, Bradley Meadows                  ACCOUNT NO.:  [id number]   MEDICAL RECORD NO.:  [id number]          PATIENT TYPE:  INP   LOCATION:  5014                         FACILITY:  [facility]   PHYSICIAN:  Feliberto Gottron. Turner Daniels, M.D.   DATE OF BIRTH:  1940/05/14   DATE OF PROCEDURE:  09/25/2008  DATE OF DISCHARGE:                               OPERATIVE REPORT   PREOPERATIVE DIAGNOSIS:  End-stage arthritis of the left knee.   POSTOPERATIVE DIAGNOSIS:  End-stage arthritis of the left knee.   PROCEDURE:  Left total knee arthroplasty using DePuy Sigma RP  components, 5 tibia, 5 femur, 12-mm spacer, 41 button.  All components  cemented with double batch of DePuy HV cement with 1500 mg of Zinacef.   SURGEON:  Feliberto Gottron. Turner Daniels, MD   FIRST ASSISTANT:  Shirl Harris, PA-C   ANESTHESIA:  General endotracheal.   ESTIMATED BLOOD LOSS:  Minimal.   FLUID REPLACEMENT:  1500 mL of crystalloid.   DRAINS PLACED:  1. Foley catheter.  2. Two medium Hemovacs.   URINE OUTPUT:  300 mL.   TOURNIQUET TIME:  1 hour and 30 minutes.   INDICATIONS FOR PROCEDURE:  A 70 year old gentleman had a very  successful right total knee a year ago and now desires same for the left  for his down to bone-on-bone along the medial side with severe  unremitting pain that he has failed conservative treatment with anti-  inflammatory medicines, physical therapy, cortisone injections,  viscosupplementation, and judicious use of narcotics.  Risks and  benefits of surgery have been discussed and are well known to the  patient.   DESCRIPTION OF PROCEDURE:  The patient identified by armband and  received preoperative IV antibiotics in the holding area at [facility] followed by left femoral nerve block.  He was then taken to  operating room #15.  Appropriate anesthetic monitors were attached, and  general endotracheal anesthesia induced with the patient in the supine  position and a Foley catheter was inserted.  Tourniquet  applied high of  the left thigh.  Lateral post and foot positioner applied, and the left  lower extremity prepped and draped in the usual sterile fashion from the  ankle to the midthigh.  Limb wrapped with an Esmarch bandage, tourniquet  inflated to 350 mmHg and a time-out procedure was performed.  We then  began the operation by making an anterior midline incision centered over  the patella 18 cm in length through the skin and subcutaneous tissue  down to the level of the transverse retinaculum, which was reflected  medially allowing a medial parapatellar arthrotomy.  The patella was  everted.  Prepatellar fat pad resected.  Along the medial side, we  elevated the superficial medial collateral ligament from anterior to  posterior leaving intact distally, performing a release of the  superficial medial collateral ligament.  With the patella everted, the  knee was then hyperflexed exposing peripheral osteophytes, notch  osteophytes, and the anterior half of the cruciate of the menisci which  were then resected with the electrocautery.  The cruciate ligaments were  also resected.  Notch osteophytes and peripheral osteophytes removed  with an osteotome allowing [place] to hyperflex the knee.  We then placed a  posteromedial Z-retractor, a Veterinary surgeon through the notch and a  lateral Hohmann retractor and entered the proximal tibia with a DePuy  step drill followed by the intramedullary rod and a 2-degree posterior  slope cutting guide set to allow resection of 3-4 mm of bone medially  and 8-9 mm of bone laterally.  This was accomplished with the  oscillating saw.  We then entered the distal femur 2 mm anterior to the  PCL origin with a step drill followed by the intramedullary rod set for  a 5-degree left distal femoral cut and an 11 mm.  The guide was pinned  along the epicondylar axis.  The distal femoral cut accomplished with  the oscillating saw and then using the posterior referencing  cutting  guide, we measured for a 5 left femoral component.  This was pinned into  place in 3 degrees of external rotation followed by the chamfer cutting  guide and the anterior, posterior, and chamfer cuts accomplished without  difficulty followed by the Sigma RP box cut.  We then measured the  everted patella 26 mm.  We used a 41 button on the other side of the  cutting guide, was set at 15 and the posterior 10-11 mm of the patella  resected.  We sized for 41 button and drilled the patella.  With the  knee hyperflexed, we sized for #5 tibial baseplate.  This was pinned in  place followed by the smokestack and the conical reamer and the Delta  fin-keel punch.  We then hammered into place a 5 left femoral trial,  drilled the lugs, inserted 10 and 12 mm bearings, the 12 had the best  fit and fill and ligamentous stability, came into full extension, and  easily flexed to 140 degrees.  The trial patella tracked with no thumb  pressure.  At this point, all trial components were removed and the bony  surfaces were Water-Pik, cleaned and dried with suction and sponges.  A  double batch of DePuy HV cement with 1500 mg of Zinacef was then mixed  at the back stable and applied to all bony metallic mating surfaces.  In  order, we hammered in place a 5 tibial baseplate and removed excess  cement; a 5 left femoral component, removed excess cement; snapped in a  12-mm bearing and reduced the knee and held in extension with  compression as the cement cured.  The patellar button was squeezed into  place and held with a clamp and again excess cement was removed.  Medium  Hemovac drains were placed from the anterolateral approach.  The wound  irrigated out one more time with normal saline solution.  We checked our  patellar tracking one more time and then closed the parapatellar  arthrotomy with running #1 Vicryl suture, the subcutaneous tissue with 0  and 2-0 undyed Vicryl suture, and the skin with skin  staples.  A  dressing of Xeroform, 4 x 4 dressing sponges, Webril, and Ace wrap was  then applied.  The tourniquet was let down.  The patient was awakened  and taken to the recovery room without difficulty.      Feliberto Gottron. Turner Daniels, M.D.  Electronically Signed     FJR/MEDQ  D:  09/25/2008  T:  09/26/2008  Job:  [id number]

## 2010-06-21 NOTE — Discharge Summary (Signed)
NAME:  Bradley Meadows, Bradley Meadows                  ACCOUNT NO.:  [id number]   MEDICAL RECORD NO.:  [id number]          PATIENT TYPE:  INP   LOCATION:  5001                         FACILITY:  [facility]   PHYSICIAN:  Feliberto Gottron. Turner Daniels, M.D.   DATE OF BIRTH:  Dec 31, 1940   DATE OF ADMISSION:  10/19/2006  DATE OF DISCHARGE:  10/21/2006                               DISCHARGE SUMMARY   DIAGNOSIS:  End-stage degenerative joint disease right knee.   PROCEDURE WHILE IN HOSPITAL:  Right total knee arthroplasty.   DISCHARGE SUMMARY:  The patient is a 70 year old male with a long-  standing end-stage arthritis of the right knee who has failed  conservative treatment with anti-inflammatory medication, narcotics,  physical therapy, cortisone injections, and Hyalgan series.  He still  has unremitting pain that is severe at times and decreases his function,  limits his activities.  He desires elective total knee arthroplasty.  There have been arthritic changes as described.  Risks and benefits of  the surgery were discussed.  Questions were answered.   NO KNOWN DRUG ALLERGIES.   MEDICATIONS AT TIME OF ADMISSION:  Metoprolol, diclofenac, lisinopril,  omeprazole, simvastatin, baby aspirin, Flovent, and Combivent.   PAST MEDICAL HISTORY:  Usual childhood diseases.  Adult history:  1. Hypertension.  2. CAD.  3. DJD.  4. GERD.  5. Reactive airway disease.   PAST SURGICAL HISTORY:  1. Hernia repair.  2. Ulcer repair.  3. Noted difficulties of DJD.   SOCIAL HISTORY:  No tobacco.  No ethanol.  No IV drug abuse.  He is  divorced, retired, lives alone.   FAMILY HISTORY:  Mother died at age 37, history of COPD.  Father died in  his 69s, history of ethanol abuse.   REVIEW OF SYSTEMS:  Positive for glasses, upper and lower dentures,  shortness of breath with exertion.  He denies any chest pain or recent  illness.   EXAMINATION:  VITAL SIGNS:  Temperature 97.8, pulse 73, blood pressure  136/85.  He is 73 inches tall,  251 pounds.  HEAD:  Normocephalic, atraumatic.  EARS:  TMs are clear.  EYES:  Pupils equally round and reactive to light and accommodation.  NOSE:  Benign.  THROAT:  Patent.  NECK:  Supple.  Full range of motion.  CHEST:  Clear to auscultation and percussion.  HEART:  Regular rate and rhythm.  ABDOMEN:  Soft, nontender.  No masses.  EXTREMITIES:  Right knee range of motion 10 to 100 degrees with positive  crepitus, positive effusion, and pain to all ranges of motion.  SKIN:  Within normal limits.   X-ray showed limited changes of DJD of the right knee.   Preoperative labs including CBC, C-Met, chest x-ray, EKG, PT, and PTT  were all within normal limits with the exception of a hematocrit of  38.3.   HOSPITAL COURSE:  On the day of admission, the patient was taken to the  operating room at [facility] where he underwent a right total knee  arthroplasty using DePuy Sigma RP components.  A #5 tibia base plate, a  #4 right  femur, a 12.5 posterior stabilizing bearing, and a 41 mm  patellar button.  All components cemented with double batch methyl  methacrylate cement, DePuy 1 type with 1500 mg set and embedded.  Foley  catheter was placed preoperatively.  A mini HemoVac was placed up onto  the knee perioperative.  The patient was placed on postoperative  Coumadin prophylaxis with a target INR of 1.52 with bridging Lovenox  therapy per pharmacy.  He was placed on a PCA Dilaudid pump for pain  control.  He was placed on perioperative antibiotics for the first 48-  hours.  Physical therapy was begun with CPN in the PACU.  Postoperative  day 1, the patient was awake and alert, taking p.o.'s well.  No nausea.  Vital signs showed a temperature max of 100.1, ranging 99.  He was  neurovascularly intact.  Urine output 475 mL since surgery, and  discontinued on postoperative day 1.  Hemoglobin 10.9, WBC 15.1.  INR  1.1.  Urine output of 450 every shift.  He had no signs or symptoms of  anemia  despite his drop in hemoglobin.  Postoperative day 2, the patient  was without complaint, he was out of bed into the room independently,  transferring without difficulty.  T-max 100, ranging 99 current.  Hemoglobin 10.2, WBC dropped to 13.6.  INR 1.4.  Dressing was dry.  Wound was benign.  Calf was soft.  He passed his PT goals in the  afternoon session.  He was discharged home in the care of his friends.   Diet is regular.  Activity is weightbearing as tolerated with walker,  total knee precautions.   MEDICATIONS:  Coumadin per [facility] home [facility], as well as Tylox, Robaxin.  Home med rec sheet signed and discussed.   Dressing changes every day or p.r.n.  Return to clinic in approximately  1 week with Dr. Turner Daniels.  Diet is regular.   DIAGNOSIS FOR THIS ADMISSION:  End-stage DJD of the right knee.   PROCEDURE WHILE IN HOSPITAL:  Right total knee arthroplasty.      Laural Benes. Jannet Mantis.      Feliberto Gottron. Turner Daniels, M.D.  Electronically Signed    JBR/MEDQ  D:  12/21/2006  T:  12/21/2006  Job:  [id number]

## 2010-06-21 NOTE — H&P (Signed)
Bradley Meadows, Bradley Meadows                  ACCOUNT NO.:  [id number]   MEDICAL RECORD NO.:  [id number]          PATIENT TYPE:  OIB   LOCATION:  2854                         FACILITY:  [facility]   PHYSICIAN:  Ricki Rodriguez, M.D.  DATE OF BIRTH:  26-Oct-1940   DATE OF ADMISSION:  06/01/2006  DATE OF DISCHARGE:  06/01/2006                              HISTORY & PHYSICAL   HOSPITAL LOCATION:  Outpatient.   HISTORY:  Abnormal stress test.   HISTORY OF PRESENT ILLNESS:  This 70 year old white male had a nuclear  stress test that was abnormal.  The patient needed clearance for knee  surgery.   PAST MEDICAL HISTORY:  Negative for diabetes, positive for hypertension.  The patient quit smoking 4 years ago.  Also, quit alcohol 15 years ago.  Has positive history of elevated cholesterol level and obesity.  Negative history of myocardial infarction.   FAMILY HISTORY:  No family history of premature coronary artery disease.   PAST SURGICAL HISTORY:  1. Stomach surgery x2.  2. Abscess drainage on the spine 34 years ago.   MEDICATIONS:  Blood pressure medicine.  The patient does not remember  the name.   ALLERGIES:  NONE.   PERSONAL HISTORY:  The patient is divorced x20 years.  Here with sister,  Marlou Porch, who is a 70 year old.   FAMILY HISTORY:  Mother died of pneumonia.  Father died of alcoholism at  age 37.  The patient does not have any brother, has 2 sisters - 65 and  58 years old.   REVIEW OF SYSTEMS:  Positive for weight loss with voluntary dieting for  3 weeks.  The patient wears glasses, has full upper and lower dentures,  has history of asthma, leg edema, and joint pains.  No history of  hearing loss, cough, hemoptysis, COPD, pneumonia, dyspnea, palpitation,  dizziness, chest pain, nausea, vomiting, diarrhea, constipation, GI  bleed, hepatitis, blood transfusion, kidney stone, stroke, seizures, or  psychiatric admissions.  The patient had flu shot in November 2007.   PHYSICAL  EXAMINATION:  VITAL SIGNS:  Pulse 86, respirations 18, blood  pressure 146/85, temperature 98, height 6 feet 1 inch, weight 247  pounds.  GENERAL:  The patient is alert and oriented x3.  HEAD:  Normocephalic, atraumatic.  EYES:  Manson Passey with patient wearing glasses.  EAR, NOSE, AND THROAT:  Reveal mucous membranes pink and moist.  NECK:  No JVD, no carotid bruits.  LUNGS:  Clear bilaterally.  HEART:  Normal S1 and S2.  ABDOMEN:  Distended but nontender.  EXTREMITIES:  Positive varicose veins and negative edema, cyanosis, or  clubbing.  CNS:  The patient moves all 4 extremities.  Cranial nerves grossly  intact.   LABORATORY DATA:  Revealed near-normal electrolytes, BUN, and  creatinine.  Glucose borderline at 119.  Hemoglobin and hematocrit, WBC  count, and platelet count normal.   EKG:  Normal sinus rhythm with left anterior hemiblock.   IMPRESSION:  1. Abnormal stress test, rule out coronary artery disease.  2. Hypertension.  3. Obesity.   PLAN:  The patient is to undergo a  cardiac catheterization to rule out  coronary artery disease.      Ricki Rodriguez, M.D.  Electronically Signed     ASK/MEDQ  D:  08/19/2006  T:  08/20/2006  Job:  [id number]

## 2010-06-21 NOTE — Cardiovascular Report (Signed)
NAME:  Bradley Meadows, Bradley Meadows                  ACCOUNT NO.:  [id number]   MEDICAL RECORD NO.:  [id number]          PATIENT TYPE:  OIB   LOCATION:  2854                         FACILITY:  [facility]   PHYSICIAN:  Ricki Rodriguez, M.D.  DATE OF BIRTH:  1940/06/15   DATE OF PROCEDURE:  06/01/2006  DATE OF DISCHARGE:                            CARDIAC CATHETERIZATION   PROCEDURE DONE BY:  Ricki Rodriguez, M.D.   HOSPITAL LOCATION:  Outpatient Section C.   Left heart catheterization, selective coronary angiography, left  ventricular function study.   INDICATION:  This 70 year old white male had abnormal stress test along  with cardiac risk factors of hypertension and elevated cholesterol  level.   APPROACH:  Right femoral artery using 4-French sheath.   COMPLICATIONS:  None.   HEMODYNAMIC DATA:  The left ventricular pressure was 133/11 and aortic  pressure was 133/73.   CORONARY ANATOMY:  The left main coronary artery was unremarkable.   LEFT ANTERIOR DESCENDING CORONARY ARTERY:  The left anterior descending  coronary artery showed luminal irregularities with a mid vessel 40-50%  disease.  Distal vessel wrapped around the apex of the heart and had  minimal disease.  Diagonal 1 was a large vessel with luminal  irregularities in the proximal portion.   LEFT CIRCUMFLEX CORONARY ARTERY:  The left circumflex coronary artery  showed proximal 20% stenosis followed by 100% occlusion.  The ramus  branch was unremarkable.  The distal OM filled via collateral from LAD.   RIGHT CORONARY ARTERY:  The right coronary artery was dominant, had  proximal 40% and mid vessel 30% x2 lesions.  The distal vessel showed  luminal irregularity.  The posterolateral branch and posterior  descending coronary artery were unremarkable.   LEFT VENTRICULOGRAM:  The left ventriculogram showed preserved left  ventricular systolic function with an ejection fraction of 60%.   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  2. Preserved left ventricular systolic function.   RECOMMENDATIONS:  This patient will continue medical therapy.      Ricki Rodriguez, M.D.  Electronically Signed     ASK/MEDQ  D:  06/01/2006  T:  06/01/2006  Job:  [id number]

## 2010-06-24 ENCOUNTER — Other Ambulatory Visit: Payer: Self-pay | Admitting: Family Medicine

## 2010-06-25 NOTE — Telephone Encounter (Signed)
Refill request

## 2010-07-01 ENCOUNTER — Other Ambulatory Visit: Payer: Self-pay | Admitting: Family Medicine

## 2010-07-02 NOTE — Telephone Encounter (Signed)
Refill request

## 2010-07-04 NOTE — Telephone Encounter (Signed)
2nd refill request

## 2010-07-05 NOTE — Telephone Encounter (Signed)
Refill request

## 2010-08-29 ENCOUNTER — Ambulatory Visit (INDEPENDENT_AMBULATORY_CARE_PROVIDER_SITE_OTHER): Payer: Medicaid Other | Admitting: Family Medicine

## 2010-08-29 ENCOUNTER — Encounter: Payer: Self-pay | Admitting: Family Medicine

## 2010-08-29 VITALS — BP 122/70 | HR 70 | Temp 97.6°F | Wt 196.0 lb

## 2010-08-29 DIAGNOSIS — I10 Essential (primary) hypertension: Secondary | ICD-10-CM

## 2010-08-29 DIAGNOSIS — E782 Mixed hyperlipidemia: Secondary | ICD-10-CM

## 2010-08-29 DIAGNOSIS — I2510 Atherosclerotic heart disease of native coronary artery without angina pectoris: Secondary | ICD-10-CM

## 2010-08-29 DIAGNOSIS — F5232 Male orgasmic disorder: Secondary | ICD-10-CM

## 2010-08-29 DIAGNOSIS — R21 Rash and other nonspecific skin eruption: Secondary | ICD-10-CM

## 2010-08-29 DIAGNOSIS — M1990 Unspecified osteoarthritis, unspecified site: Secondary | ICD-10-CM

## 2010-08-29 DIAGNOSIS — I5022 Chronic systolic (congestive) heart failure: Secondary | ICD-10-CM

## 2010-08-29 DIAGNOSIS — F17200 Nicotine dependence, unspecified, uncomplicated: Secondary | ICD-10-CM

## 2010-08-29 DIAGNOSIS — Z720 Tobacco use: Secondary | ICD-10-CM | POA: Insufficient documentation

## 2010-08-29 DIAGNOSIS — J449 Chronic obstructive pulmonary disease, unspecified: Secondary | ICD-10-CM

## 2010-08-29 MED ORDER — IPRATROPIUM-ALBUTEROL 18-103 MCG/ACT IN AERO: 2.0000 | INHALATION_SPRAY | Freq: Four times a day (QID) | RESPIRATORY_TRACT | Status: DC

## 2010-08-29 MED ORDER — SILDENAFIL CITRATE 25 MG PO TABS: 25.0000 mg | ORAL_TABLET | Freq: Every day | ORAL | Status: DC | PRN

## 2010-08-29 MED ORDER — METOPROLOL TARTRATE 25 MG PO TABS: 25.0000 mg | ORAL_TABLET | Freq: Two times a day (BID) | ORAL | Status: DC

## 2010-08-29 MED ORDER — OMEPRAZOLE 20 MG PO CPDR: 20.0000 mg | DELAYED_RELEASE_CAPSULE | Freq: Every day | ORAL | Status: DC

## 2010-08-29 MED ORDER — DICLOFENAC SODIUM ER 100 MG PO TB24: 100.0000 mg | ORAL_TABLET | Freq: Every day | ORAL | Status: DC

## 2010-08-29 MED ORDER — HYDROCODONE-ACETAMINOPHEN 5-500 MG PO TABS: 1.0000 | ORAL_TABLET | Freq: Two times a day (BID) | ORAL | Status: DC | PRN

## 2010-08-29 MED ORDER — ASPIRIN 81 MG PO TABS: 81.0000 mg | ORAL_TABLET | Freq: Every day | ORAL | Status: DC

## 2010-08-29 MED ORDER — LISINOPRIL-HYDROCHLOROTHIAZIDE 20-12.5 MG PO TABS: 1.0000 | ORAL_TABLET | Freq: Every day | ORAL | Status: DC

## 2010-08-29 NOTE — Assessment & Plan Note (Signed)
Stable. Advised smoking cessation. Just started briefly past few days.

## 2010-08-29 NOTE — Assessment & Plan Note (Signed)
Unchanged. Asymptomatic. No chest pain symptoms. Continue baby aspirin.

## 2010-08-29 NOTE — Assessment & Plan Note (Addendum)
I think this is eczema. I recommend conservative treatment for now with Eucerin and OTC hydrocortisone since it has been improving with OTC hydrocortisone. If this worsens or does not improve, consider other papulosquamous disease (pityriasis, lichen planus, guttate psoriasis).

## 2010-08-29 NOTE — Assessment & Plan Note (Signed)
Will refill Viagra 

## 2010-08-29 NOTE — Assessment & Plan Note (Signed)
Will check LDL today.

## 2010-08-29 NOTE — Assessment & Plan Note (Signed)
Improved. BP good today. Continue current medications.

## 2010-08-29 NOTE — Assessment & Plan Note (Signed)
Deteriorated. Encouraged cessation.

## 2010-08-29 NOTE — Assessment & Plan Note (Addendum)
Unchanged or may be slightly worsening. Still with good ROM. Encouraged to use arm to prevent frozen shoulder. Continue current medications.

## 2010-08-29 NOTE — Patient Instructions (Addendum)
It was nice to meet you today.   If your lab results are normal, I will send you a letter with the results. If abnormal, someone at the clinic will get in touch with you.   I hope you decide not to smoke any more!  Please follow-up in 3 months for medication refills and to follow-up on your medical problems.   For your rash, try a thick lotion like Eucerin lotion at the grocery store. If this doesn't work, then try an over-the-counter hydrocortisone cream. Call the clinic and let me know if the rash doesn't improve in the next 1-2 weeks with this treatment.

## 2010-08-29 NOTE — Progress Notes (Signed)
  Subjective:    Patient ID: Bradley Meadows, male    DOB: September 17, 1940, 70 y.o.   MRN: [id number]  HPI 1. HLD Compliant with medication. ROS: denies RUQ pain, myalgias  2. HTN Compliant with medications. ROS: denies light-headedness, chest pain  3. CAD, chronic systolic HF (EF 45%) Asymptomatic. Hasn't seen Cardiologist for a while. Had several stress test in the past.  Told there is a blockage in one of his vessels. Has never had surgery on heart.  ROS: denies palpatations, SOB  4. COPD Quit smoking in 2004 but started smoking again recently.  Girlfriend left package of cigarettes. Is trying to stop. Knows he should.   5. Osteoarthritis Right shoulder still bothering him. Used to be in [employer] and used that arm a lot.  Medications help.  Does not take pain medications daily. Just as needed. Never more than 2 Vicodin in a day.  Denies limitations in daily function. Able to left arm okay if he manipulates it a certain way.  6. Skin rash Started a few weeks ago. Had it several years ago. Cream prescribed to him helped. Tried sister's steroid cream. Think it helped.  Review of Systems    Objective:   Physical Exam General: pleasant, NAD, well-groomed Psych: not agitated/depressed, appropriate to questions, cognitively intact CV: decreased HS, no clear murmur auscultated Pulm: occasional expiratory wheeze, no increased WOB, no rales Abd: soft, non-tender Ext: no edema  Skin: 4x4cm flat, dry, well-demarcated, brightly erythematous rash above right medial malleolus, non-scaling    Assessment & Plan:

## 2010-08-30 ENCOUNTER — Encounter: Payer: Self-pay | Admitting: Family Medicine

## 2010-08-30 LAB — COMPREHENSIVE METABOLIC PANEL
Albumin: 4.1 g/dL (ref 3.5–5.2)
BUN: 11 mg/dL (ref 6–23)
CO2: 27 mEq/L (ref 19–32)
Calcium: 9.7 mg/dL (ref 8.4–10.5)
Chloride: 95 mEq/L — ABNORMAL LOW (ref 96–112)
Glucose, Bld: 94 mg/dL (ref 70–99)
Potassium: 3.8 mEq/L (ref 3.5–5.3)
Total Protein: 6.2 g/dL (ref 6.0–8.3)

## 2010-09-11 ENCOUNTER — Encounter: Payer: Self-pay | Admitting: Home Health Services

## 2010-09-11 ENCOUNTER — Ambulatory Visit (INDEPENDENT_AMBULATORY_CARE_PROVIDER_SITE_OTHER): Payer: Medicare Other | Admitting: Home Health Services

## 2010-09-11 VITALS — BP 115/75 | HR 61 | Temp 98.4°F | Ht 70.0 in | Wt 198.8 lb

## 2010-09-11 DIAGNOSIS — Z0000 Encounter for general adult medical examination without abnormal findings: Secondary | ICD-10-CM

## 2010-09-11 NOTE — Patient Instructions (Signed)
1. Continue to work on losing 8 pounds. 2. Focus on eating 3-4 vegetables a day. 3. Continue to move more every day. 4. Consider getting a colonoscopy. 5. Bring in copy of shingles vaccine. 6. Set a quit smoking date.

## 2010-09-11 NOTE — Progress Notes (Signed)
Patient here for annual wellness visit, patient reports: Risk Factors/Conditions needing evaluation or treatment: Pt does not have any risk factors that need evaluation. Home Safety: Pt lives by self in 1 story home.  Pt reports having smoke detectors and does not have adaptive equipment in bathroom. Other Information: Corrective lens: Pt wears daily corrective lens and visits eye doctor annually. Dentures: Pt has full dentures. Memory: Pt denies memory problems. Patient's Mini Mental Score (recorded in doc. flowsheet): 28  Balance/Gait: Pt does not have any noticeable impairments but has had both knees replaced.  Balance Abnormal Patient value  Sitting balance    Sit to stand    Attempts to arise    Immediate standing balance    Standing balance    Nudge    Eyes closed- Romberg    Tandem stance    Back lean    Neck Rotation    360 degree turn    Sitting down     Gait Abnormal Patient value  Initiation of gait    Step length-left    Step length-right    Step height-left    Step height-right    Step symmetry    Step continuity    Path deviation    Trunk movement    Walking stance        Annual Wellness Visit Requirements Recorded Today In  Medical, family, social history Past Medical, Family, Social History Section  Current providers Care team  Current medications Medications  Wt, BP, Ht, BMI Vital signs  Hearing assessment (welcome visit) Hearing/vision  Tobacco, alcohol, illicit drug use History  ADL Nurse Assessment  Depression Screening Nurse Assessment  Cognitive impairment Nurse Assessment  Mini Mental Status Document Flowsheet  Fall Risk Nurse Assessment  Home Safety Progress Note  End of Life Planning (welcome visit) Social Documentation  Medicare preventative services Progress Note  Risk factors/conditions needing evaluation/treatment Progress Note  Personalized health advice Patient Instructions, goals, letter  Diet & Exercise Social Documentation    Emergency Contact Social Documentation  Seat Belts Social Documentation  Sun exposure/protection Social Documentation    Medicare Prevention Plan: We dicussed colonoscopy, shingles vaccine, and ultrasound.   Recommended Medicare Prevention Screenings Men over 61 Test For Frequency Date of Last- BOLD if needed  Colorectal Cancer 1-10 yrs Declined  Prostate Cancer Never or yearly 2/09  Aortic Aneurysm Once if 65-75 with hx of smoking Under care of cardiologist  Cholesterol 5 yrs 10/11  Diabetes yearly Non diabetic  HIV yearly declined  Influenza Shot yearly 10/11  Pneumonia Shot once 9/08  Zostavax Shot once Pt reported done

## 2010-09-12 ENCOUNTER — Ambulatory Visit: Payer: Self-pay | Admitting: Home Health Services

## 2010-09-22 NOTE — Progress Notes (Signed)
  Subjective:    Patient ID: Bradley Meadows, male    DOB: 06/10/1940, 70 y.o.   MRN: [id number]  HPI    Review of Systems     Objective:   Physical Exam        Assessment & Plan:  I have reviewed this visit and discussed with Arlys John and agree with her documentation.

## 2010-10-03 ENCOUNTER — Other Ambulatory Visit: Payer: Self-pay | Admitting: Family Medicine

## 2010-10-03 NOTE — Telephone Encounter (Signed)
Refill request

## 2010-10-13 ENCOUNTER — Other Ambulatory Visit: Payer: Self-pay | Admitting: Family Medicine

## 2010-10-14 NOTE — Telephone Encounter (Signed)
Refill request

## 2010-11-14 LAB — BASIC METABOLIC PANEL
Chloride: 91 — ABNORMAL LOW
GFR calc Af Amer: 54 — ABNORMAL LOW
GFR calc non Af Amer: 44 — ABNORMAL LOW
Potassium: 4.2

## 2010-11-14 LAB — PROTIME-INR
INR: 1.1
Prothrombin Time: 14.7
Prothrombin Time: 17.5 — ABNORMAL HIGH

## 2010-11-14 LAB — CBC
HCT: 28.9 — ABNORMAL LOW
Hemoglobin: 10.9 — ABNORMAL LOW
MCV: 87.6
RBC: 3.3 — ABNORMAL LOW
RBC: 3.58 — ABNORMAL LOW
WBC: 13.6 — ABNORMAL HIGH
WBC: 15 — ABNORMAL HIGH

## 2010-11-15 LAB — URINALYSIS, ROUTINE W REFLEX MICROSCOPIC
Hgb urine dipstick: NEGATIVE
Specific Gravity, Urine: 1.017
Urobilinogen, UA: 0.2
pH: 5.5

## 2010-11-15 LAB — DIFFERENTIAL
Eosinophils Absolute: 0.2
Eosinophils Relative: 2
Lymphs Abs: 2
Monocytes Absolute: 0.7
Monocytes Relative: 9
Neutrophils Relative %: 62

## 2010-11-15 LAB — COMPREHENSIVE METABOLIC PANEL
ALT: 27
AST: 23
CO2: 27
Calcium: 9.8
Chloride: 100
GFR calc Af Amer: >60
GFR calc non Af Amer: >60
Glucose, Bld: 88
Sodium: 135
Total Bilirubin: 0.6

## 2010-11-15 LAB — CBC
Hemoglobin: 13.5
MCHC: 35.2
MCV: 87.5
RBC: 4.38
WBC: 7.6

## 2010-11-15 LAB — PROTIME-INR: INR: 1

## 2010-11-15 LAB — TYPE AND SCREEN
ABO/RH(D): A POS
Antibody Screen: NEGATIVE

## 2010-12-11 ENCOUNTER — Encounter: Payer: Self-pay | Admitting: Home Health Services

## 2010-12-28 ENCOUNTER — Other Ambulatory Visit: Payer: Self-pay | Admitting: Family Medicine

## 2010-12-29 NOTE — Telephone Encounter (Signed)
Refill request

## 2011-01-13 ENCOUNTER — Other Ambulatory Visit: Payer: Self-pay | Admitting: Family Medicine

## 2011-01-13 NOTE — Telephone Encounter (Signed)
Refill request

## 2011-01-16 NOTE — Telephone Encounter (Signed)
Patient needs appointment within month for any more refills.

## 2011-01-21 ENCOUNTER — Encounter: Payer: Self-pay | Admitting: Family Medicine

## 2011-02-06 ENCOUNTER — Other Ambulatory Visit: Payer: Self-pay | Admitting: Family Medicine

## 2011-02-07 NOTE — Telephone Encounter (Signed)
Refill request

## 2011-02-15 ENCOUNTER — Other Ambulatory Visit: Payer: Self-pay | Admitting: Family Medicine

## 2011-02-16 NOTE — Telephone Encounter (Signed)
Refill request

## 2011-02-17 NOTE — Telephone Encounter (Signed)
Please ask patient to follow-up in the next 3 months.

## 2011-03-06 ENCOUNTER — Other Ambulatory Visit: Payer: Self-pay | Admitting: Family Medicine

## 2011-03-06 DIAGNOSIS — M1990 Unspecified osteoarthritis, unspecified site: Secondary | ICD-10-CM

## 2011-04-02 ENCOUNTER — Other Ambulatory Visit: Payer: Self-pay | Admitting: Family Medicine

## 2011-04-02 NOTE — Telephone Encounter (Signed)
Refill request

## 2011-05-04 ENCOUNTER — Other Ambulatory Visit: Payer: Self-pay | Admitting: Family Medicine

## 2011-06-05 ENCOUNTER — Other Ambulatory Visit: Payer: Self-pay | Admitting: Family Medicine

## 2011-06-21 ENCOUNTER — Other Ambulatory Visit: Payer: Self-pay | Admitting: Family Medicine

## 2011-06-23 NOTE — Telephone Encounter (Signed)
Needs appointment for further refills

## 2011-06-25 ENCOUNTER — Other Ambulatory Visit: Payer: Self-pay | Admitting: Family Medicine

## 2011-07-24 ENCOUNTER — Telehealth: Payer: Self-pay | Admitting: *Deleted

## 2011-07-24 NOTE — Telephone Encounter (Signed)
Received call from pharmacy requesting refill on Combivent to Combivent Restimet and to change directions to 1 puff four times daily instead of 2 puffs four times daily.  Plain Combivent is not made any longer. Will send  To Dr. Madolyn Frieze. Pharmacy number if needed [phone]

## 2011-07-25 MED ORDER — IPRATROPIUM-ALBUTEROL 20-100 MCG/ACT IN AERS: 1.0000 | INHALATION_SPRAY | RESPIRATORY_TRACT | Status: DC | PRN

## 2011-07-25 NOTE — Telephone Encounter (Signed)
New Rx sent.

## 2011-08-05 ENCOUNTER — Telehealth: Payer: Self-pay | Admitting: Family Medicine

## 2011-08-05 MED ORDER — OMEPRAZOLE 20 MG PO CPDR: 20.0000 mg | DELAYED_RELEASE_CAPSULE | Freq: Every day | ORAL | Status: DC

## 2011-08-05 NOTE — Telephone Encounter (Signed)
Please notify Rx sent. Thank you.  

## 2011-08-05 NOTE — Telephone Encounter (Signed)
Patient has called and scheduled an appt for med refills.  That appt is on 7/10 and he has been put on the Wait List in case of any cancellations, but he would like a refill on his Prilosec to last until his appt.

## 2011-08-13 ENCOUNTER — Ambulatory Visit: Payer: Medicare Other | Admitting: Family Medicine

## 2011-08-29 ENCOUNTER — Encounter: Payer: Self-pay | Admitting: Family Medicine

## 2011-08-29 ENCOUNTER — Ambulatory Visit (INDEPENDENT_AMBULATORY_CARE_PROVIDER_SITE_OTHER): Payer: Medicare Other | Admitting: Family Medicine

## 2011-08-29 VITALS — HR 87 | Temp 97.9°F | Ht 70.0 in | Wt 214.0 lb

## 2011-08-29 DIAGNOSIS — I10 Essential (primary) hypertension: Secondary | ICD-10-CM

## 2011-08-29 DIAGNOSIS — J4489 Other specified chronic obstructive pulmonary disease: Secondary | ICD-10-CM

## 2011-08-29 DIAGNOSIS — F5232 Male orgasmic disorder: Secondary | ICD-10-CM

## 2011-08-29 DIAGNOSIS — Z720 Tobacco use: Secondary | ICD-10-CM

## 2011-08-29 DIAGNOSIS — M1990 Unspecified osteoarthritis, unspecified site: Secondary | ICD-10-CM

## 2011-08-29 DIAGNOSIS — I5022 Chronic systolic (congestive) heart failure: Secondary | ICD-10-CM

## 2011-08-29 DIAGNOSIS — R21 Rash and other nonspecific skin eruption: Secondary | ICD-10-CM

## 2011-08-29 DIAGNOSIS — I2510 Atherosclerotic heart disease of native coronary artery without angina pectoris: Secondary | ICD-10-CM

## 2011-08-29 DIAGNOSIS — J449 Chronic obstructive pulmonary disease, unspecified: Secondary | ICD-10-CM

## 2011-08-29 DIAGNOSIS — E782 Mixed hyperlipidemia: Secondary | ICD-10-CM

## 2011-08-29 DIAGNOSIS — F17200 Nicotine dependence, unspecified, uncomplicated: Secondary | ICD-10-CM

## 2011-08-29 LAB — LIPID PANEL
Cholesterol: 135 mg/dL (ref 0–200)
LDL Cholesterol: 87 mg/dL (ref 0–99)
VLDL: 16 mg/dL (ref 0–40)

## 2011-08-29 MED ORDER — SIMVASTATIN 20 MG PO TABS: 20.0000 mg | ORAL_TABLET | Freq: Every day | ORAL | Status: DC

## 2011-08-29 MED ORDER — LISINOPRIL-HYDROCHLOROTHIAZIDE 20-12.5 MG PO TABS: 1.0000 | ORAL_TABLET | Freq: Every day | ORAL | Status: DC

## 2011-08-29 MED ORDER — SILDENAFIL CITRATE 25 MG PO TABS: 25.0000 mg | ORAL_TABLET | Freq: Every day | ORAL | Status: DC | PRN

## 2011-08-29 MED ORDER — TRIAMCINOLONE ACETONIDE 0.1 % EX CREA: TOPICAL_CREAM | Freq: Two times a day (BID) | CUTANEOUS | Status: DC

## 2011-08-29 MED ORDER — METOPROLOL TARTRATE 25 MG PO TABS: 25.0000 mg | ORAL_TABLET | Freq: Two times a day (BID) | ORAL | Status: DC

## 2011-08-29 MED ORDER — OMEPRAZOLE 20 MG PO CPDR: 20.0000 mg | DELAYED_RELEASE_CAPSULE | Freq: Every day | ORAL | Status: DC

## 2011-08-29 NOTE — Assessment & Plan Note (Signed)
Previously good control.  Will check FLP today.

## 2011-08-29 NOTE — Assessment & Plan Note (Signed)
Viagra helps but expensive. Will give 2 at a time per patient request

## 2011-08-29 NOTE — Patient Instructions (Addendum)
Your quite date: 07/28 [facility])  If your lab results are normal, I will send you a letter with the results. If abnormal, someone at the clinic will get in touch with you.   For rash on leg: -Try steroid cream and then cover with Vaseline twice a day as needed  Follow-up in 6 months for routine follow-up.

## 2011-08-29 NOTE — Assessment & Plan Note (Signed)
Not well controlled today due to suboptimally taking metoprolol due to being low on medications.  Previously good/fair control SBP 110-140s.  Will refill meds. Advised check BP periodically at pharmacy.

## 2011-08-29 NOTE — Progress Notes (Signed)
  Subjective:    Patient ID: Bradley Meadows, male    DOB: 01/12/1941, 71 y.o.   MRN: [id number]  HPI Medication re-fill.   # CAD, chronic systolic HF, HTN Taking medications Does not see cardiologist routinely Does not check BP outside of clinic. He has only been taking 1 tablet of metoprolol daily instead of bid because he was running low No chest pain, difficulty breathing, leg swelling  # COPD, tobacco use Using inhaler more often (a few times a month) because he started smoking again 1 year ago He is interested in quitting ROS: denies SOB  # Erectile dysfunction Sexually active with girlfriend Medication helps but expensive  # HLD Compliant with statin ROS: denies abdominal pain, nausea  # Rash around ankles Happens around this time of year Cream prescribed in the past helped. OTC steroid cream not helping Itches; does not hurt ROS: no fevers/chills, nausea  # Preventative He does not want a colonoscopy at this time  Review of Systems Per HPI Denies constipation/diarrhea Denies blood in stool  Allergies, medication, past medical history reviewed.     Objective:   Physical Exam GEN: NAD; well-nourished, -appearing; overweight PSYCH: pleasant, engaged, alert and oriented, appropriate to questions CV: RRR, no m/r PULM: NI WOB; good air movement; CTAB without w/rales ABD: NABS, soft, NT,  EXT: no edema SKIN: 3 cm circular erythematous, dry, scaly areas x 3 around right medial malleolus    Assessment & Plan:

## 2011-08-29 NOTE — Assessment & Plan Note (Signed)
Worse recently because of tobacco use. See "tobacco" A/P. Combivent prn helps; uses few times a month

## 2011-08-29 NOTE — Assessment & Plan Note (Signed)
Appears to be eczema. TAC 0.1%, heavy emollients.

## 2011-08-29 NOTE — Assessment & Plan Note (Signed)
Quit date set for Sunday 07/28 after he finished current pack. He has nicotine patches that he will use.

## 2011-08-29 NOTE — Assessment & Plan Note (Signed)
Stable. Asymptomatic. Continue beta-blocker, ACEi/HCTZ.

## 2011-08-29 NOTE — Assessment & Plan Note (Signed)
Documentation only. Followed by orthopedist for knee and shoulder arthritis.

## 2011-09-01 ENCOUNTER — Encounter: Payer: Self-pay | Admitting: Family Medicine

## 2011-09-29 ENCOUNTER — Encounter: Payer: Self-pay | Admitting: Home Health Services

## 2011-10-10 ENCOUNTER — Other Ambulatory Visit: Payer: Self-pay | Admitting: Family Medicine

## 2011-12-16 ENCOUNTER — Other Ambulatory Visit: Payer: Self-pay | Admitting: Family Medicine

## 2012-01-15 ENCOUNTER — Other Ambulatory Visit: Payer: Self-pay | Admitting: Family Medicine

## 2012-01-20 ENCOUNTER — Other Ambulatory Visit: Payer: Self-pay | Admitting: Orthopedic Surgery

## 2012-02-16 ENCOUNTER — Encounter (HOSPITAL_BASED_OUTPATIENT_CLINIC_OR_DEPARTMENT_OTHER): Admission: RE | Payer: Self-pay | Source: Ambulatory Visit

## 2012-02-16 ENCOUNTER — Ambulatory Visit (HOSPITAL_BASED_OUTPATIENT_CLINIC_OR_DEPARTMENT_OTHER): Admission: RE | Admit: 2012-02-16 | Payer: Medicare Other | Source: Ambulatory Visit | Admitting: Orthopedic Surgery

## 2012-02-16 SURGERY — ARTHROSCOPY, SHOULDER
Anesthesia: General | Site: Shoulder | Laterality: Right

## 2012-03-19 ENCOUNTER — Other Ambulatory Visit: Payer: Self-pay | Admitting: Family Medicine

## 2012-04-21 ENCOUNTER — Other Ambulatory Visit: Payer: Self-pay | Admitting: Family Medicine

## 2012-04-25 ENCOUNTER — Other Ambulatory Visit: Payer: Self-pay | Admitting: Family Medicine

## 2012-05-26 ENCOUNTER — Ambulatory Visit (INDEPENDENT_AMBULATORY_CARE_PROVIDER_SITE_OTHER): Payer: Medicare Other | Admitting: Family Medicine

## 2012-05-26 VITALS — BP 142/74 | HR 70 | Ht 73.0 in | Wt 219.0 lb

## 2012-05-26 DIAGNOSIS — R42 Dizziness and giddiness: Secondary | ICD-10-CM

## 2012-05-26 DIAGNOSIS — H538 Other visual disturbances: Secondary | ICD-10-CM

## 2012-05-26 LAB — CBC
HCT: 41.3 % (ref 39.0–52.0)
MCH: 30.1 pg (ref 26.0–34.0)
MCV: 88.8 fL (ref 78.0–100.0)
RBC: 4.65 MIL/uL (ref 4.22–5.81)
WBC: 8.7 10*3/uL (ref 4.0–10.5)

## 2012-05-26 LAB — COMPREHENSIVE METABOLIC PANEL
ALT: 9 U/L (ref 0–53)
AST: 15 U/L (ref 0–37)
Albumin: 4 g/dL (ref 3.5–5.2)
BUN: 11 mg/dL (ref 6–23)
Calcium: 9.1 mg/dL (ref 8.4–10.5)
Chloride: 93 mEq/L — ABNORMAL LOW (ref 96–112)
Potassium: 4.2 mEq/L (ref 3.5–5.3)

## 2012-05-26 NOTE — Progress Notes (Addendum)
  Subjective:    Patient ID: Bradley Meadows, male    DOB: August 11, 1940, 72 y.o.   MRN: [id number]  HPI # He has a "problem on his head".   Things started getting messed up with his vision and he saw an eye doctor 3 months ago. He had his prescription slightly changed. His glasses make him dizzy and he cannot see right.   He fell about 3 weeks ago. He cracked some ribs and had a black eye.  He went back to eye doctor the day before yesterday and was told it was not his vision. He is seen by Dr. Sharlot Gowda at [facility] clinic.   He endorses dizziness when he puts glasses on. He loses his balance really easily. He denies feeling dizzy or unbalanced when he is not wearing his glasses.  When he puts glasses on in the morning, he feels like he is half drunk. Prior to putting them on, he feels okay. However, he needs to wear his glasses.   Review of Systems Denies vertigo, ear pain, decreased hearing, chest pain, dyspnea  Allergies, medication, past medical history reviewed.  Smoking status noted. COPD, Tobacco use  HTN, CAD Osteoarthritis     Objective:   Physical Exam GEN: NAD  GAIT: normal, he does not seem unbalanced with ambulation, non-antalgic gait NEURO:   HEENT: CN intact, no nystagmus, EOMI, PERRLA; hearing grossly intact   Retina: could not visualize well due to patient having a hard time focusing gaze with light in eyes   Strength: 5/5 all extremities   Sensation: intact throughout   Cerebellar: normal finger to nose bilaterally CV: RRR PULM: NI WOB EXT: no pitting pretibial edema     Assessment & Plan:

## 2012-05-26 NOTE — Patient Instructions (Addendum)
We will refer you to see an eye doctor (ophthalmologist)  We will check your labs today  Follow-up in 1 week

## 2012-05-26 NOTE — Assessment & Plan Note (Addendum)
Dizziness for about 3 months that seems to be related to when he is wearing glasses. However, he has been evaluated by optometrist Dr. Sharlot Gowda for glasses twice and was told that his vision was okay, adjustments to lens strength was made without improvement in symptoms. He did fair on visual acuity test today. He does not seem unbalanced today but reported feeling off balance with ambulation, like he was going to fall.  D/Dx is broad and may be multifactorial. Most likely seems related to vision since he endorses blurred vision with dizziness/instability; cataracts may be contributing; may also be anxiety-related, vestibular, cerebellar, cervical spondylosis, Parkinson's, CVA, deconditioning,  -Check Glc, Cr -Refer to ophthalmology for evaluation  -Request records from optometrist Dr. Sharlot Gowda -Follow-up in 1 week to go over labs and for re-evaluation

## 2012-05-30 ENCOUNTER — Other Ambulatory Visit: Payer: Self-pay | Admitting: Family Medicine

## 2012-05-31 ENCOUNTER — Encounter: Payer: Self-pay | Admitting: Home Health Services

## 2012-05-31 DIAGNOSIS — Z9181 History of falling: Secondary | ICD-10-CM | POA: Insufficient documentation

## 2012-06-02 ENCOUNTER — Ambulatory Visit (INDEPENDENT_AMBULATORY_CARE_PROVIDER_SITE_OTHER): Payer: Medicare Other | Admitting: Family Medicine

## 2012-06-02 VITALS — BP 140/80 | HR 60 | Ht 73.0 in | Wt 218.0 lb

## 2012-06-02 DIAGNOSIS — R42 Dizziness and giddiness: Secondary | ICD-10-CM

## 2012-06-02 DIAGNOSIS — H6120 Impacted cerumen, unspecified ear: Secondary | ICD-10-CM

## 2012-06-02 DIAGNOSIS — D649 Anemia, unspecified: Secondary | ICD-10-CM

## 2012-06-02 DIAGNOSIS — H6123 Impacted cerumen, bilateral: Secondary | ICD-10-CM

## 2012-06-02 DIAGNOSIS — E871 Hypo-osmolality and hyponatremia: Secondary | ICD-10-CM

## 2012-06-02 NOTE — Progress Notes (Signed)
  Subjective:    Patient ID: Bradley Meadows, male    DOB: 29-Sep-1940, 72 y.o.   MRN: [id number]  HPI # Dizziness He reports it is a little bit better. He is not dizzy every day. On Monday he had a good day and yesterday it was not as bad.  He reports having problems with his "ears" before where he got really dizzy and had to sit all day. When he gets his ears washed out "real good" the dizziness goes away.   He denies dizziness at this time.   He denies blood in stool.   Review of Systems Denies chest pain, falls, fevers, chills, dyspnea  Allergies, medication, past medical history reviewed.  Smoking status noted.     Objective:   Physical Exam GEN: NAD; elderly PSYCH: appropriate to questions CV: RRR PULM: NI WOB HEENT: cerumen impaction bilaterally EXT: no pitting edema GAIT: normal, steady   No nystagmus or dizziness with Dix-Hallpike maneuver   Ears washed out today     Assessment & Plan:

## 2012-06-02 NOTE — Patient Instructions (Addendum)
Clean out your ears today  I will call you with your lab results  Follow-up in 3 months

## 2012-06-03 DIAGNOSIS — E871 Hypo-osmolality and hyponatremia: Secondary | ICD-10-CM | POA: Insufficient documentation

## 2012-06-03 DIAGNOSIS — D649 Anemia, unspecified: Secondary | ICD-10-CM | POA: Insufficient documentation

## 2012-06-03 LAB — IRON: Iron: 66 ug/dL (ref 42–165)

## 2012-06-03 LAB — IBC PANEL: %SAT: 17 % — ABNORMAL LOW (ref 20–55)

## 2012-06-03 MED ORDER — FERROUS SULFATE 325 (65 FE) MG PO TABS: 325.0000 mg | ORAL_TABLET | Freq: Every day | ORAL | Status: DC

## 2012-06-03 NOTE — Assessment & Plan Note (Addendum)
His dizziness is about the same.  Negative Dix-Hallpike.  He is anemic, and I am concerned this may be contributing although he denies orthostatic symptoms. Iron saturation is low. No obvious source of bleeding. He declines colonoscopy at this time. He is amenable to doing home stool cards. Start iron; cautioned against constipation side effect. We also washes out his ear wax today and speaking with him day after clinic visit, he reports improvement in dizziness.  Follow-up in 1 month or sooner if needed He is still waiting to see ophthalmologist

## 2012-06-03 NOTE — Assessment & Plan Note (Signed)
See dizziness A/P 

## 2012-06-16 LAB — POC HEMOCCULT BLD/STL (HOME/3-CARD/SCREEN)
Card #2 Fecal Occult Blod, POC: NEGATIVE
Fecal Occult Blood, POC: NEGATIVE

## 2012-06-16 NOTE — Addendum Note (Signed)
Addended by: [place], Broghan Pannone on: 06/16/2012 05:13 PM   Modules accepted: Orders

## 2012-06-17 ENCOUNTER — Encounter: Payer: Self-pay | Admitting: Family Medicine

## 2012-07-01 ENCOUNTER — Other Ambulatory Visit: Payer: Self-pay | Admitting: Family Medicine

## 2012-07-01 ENCOUNTER — Other Ambulatory Visit: Payer: Self-pay | Admitting: *Deleted

## 2012-07-01 NOTE — Telephone Encounter (Signed)
Requested Prescriptions   Pending Prescriptions Disp Refills  . Ipratropium-Albuterol (COMBIVENT RESPIMAT) 20-100 MCG/ACT AERS respimat

## 2012-07-02 MED ORDER — IPRATROPIUM-ALBUTEROL 20-100 MCG/ACT IN AERS: 1.0000 | INHALATION_SPRAY | Freq: Four times a day (QID) | RESPIRATORY_TRACT | Status: DC | PRN

## 2012-07-07 ENCOUNTER — Ambulatory Visit (INDEPENDENT_AMBULATORY_CARE_PROVIDER_SITE_OTHER): Payer: Medicare Other | Admitting: Family Medicine

## 2012-07-07 VITALS — BP 127/67 | HR 67 | Temp 97.5°F | Ht 73.5 in | Wt 219.0 lb

## 2012-07-07 DIAGNOSIS — R42 Dizziness and giddiness: Secondary | ICD-10-CM

## 2012-07-07 NOTE — Assessment & Plan Note (Addendum)
For the past 5-6 months he has been having episodes of dizziness where he feels like his surroundings are "rocking" It is exacerbated by putting on his glasses but sometimes does not happen even when he wears his glasses This is his 3rd visit for this issue and the severity is unchanged He had to give-up his small dog due to concern that he might trip over it and fall; he has had one significant fall, however, he reports this occurred when he tripped and not due to his dizziness; he is concerned though about falling due to his symptom Etiology unclear at this time. Appears more consistent with vertigo than with orthostasis.  Going down differential: -Parkinson's? However, no other symptoms including tremor, abnormal gait.  -Vestibular? Consider referral for vestibular rehabilitation.    -Dix-Hallpike negative in clinic (06/02/2012) -Glc and Cr (05/2012) WNL -Cerebovascular? Direct LDL (06/2012) 82. Hold off on MRI at this time.  -Refer to ophthalmology for evaluation>>>he had not seen; he was referred at last visit, however, he was supposed to get back to our scheduler with name or preferred ophthalmologist which he did not do>>>will follow-up on ENT referral first then consider referring to opthalmology to evaluate vision/cataracts -Request records from optometrist Dr. Sharlot Gowda [phone]>>>records requested today -Iron deficiency anemia, 17% sat on iron panel, iron 66 (WNL)>>>he is taking iron 325 qd; he denies orthostatic symptoms, however may be contributing.    -Hemoccult x 3 negative (06/2012) -Refer to ENT today to evaluate for inner ear pathology. He passed hearing screen in clinic today but reports his hearing decreases intermittently (such as when using the phone, his hearing sometimes decreases but then it returns during the same conversation).  -Follow-up in 1-2 weeks

## 2012-07-07 NOTE — Progress Notes (Signed)
  Subjective:    Patient ID: Bradley Meadows, male    DOB: 1940-11-06, 72 y.o.   MRN: [id number]  HPI # Dizziness Duration: 5 months; spells last "pretty much all day"; some days though he is not dizzy at all  Progression: unchanged; he reports started all of a sudden and he thought it was due to his glasses  Exacerbated by: putting on his glasses (sometimes); he usually does not have dizziness in the morning prior to wearing his glasses  He had to get rid of his dog because he tripped over it sometimes; he has had no recent falls  He is a little dizzy at this time He feels like there is a rocking motion around him; it is not a "real spinning" like the room is spinning The dizziness does not get worse with position changes   Medications tried:  Equate for motion sickness helps dizziness some  Review of Systems Denies tinnitus, ear pain  Endorses sometimes changes in hearing volume when he talks on the phone based on how he positions the phone on his ear Endorses a little bit of nasal congestion  Endorses feeling depressed "sometimes" not associated with changes in sleep, appetite, crying spells, SI/HI  Allergies, medication, past medical history reviewed.  Smoking status noted.     Objective:   Physical Exam GEN: NAD; well-appearing PSYCH: mildly depressed appearing; not anxious appearing; alert and oriented CV: RRR; no murmurs PULM: NI WOB; CTAB without w/r/r NECK: intact ROM, non-tender ABD: soft, NT NEURO:   Moves all extremities well   GAIT normal, non antalgic   No tremor    5/5 strength all extremities      Assessment & Plan:

## 2012-07-07 NOTE — Patient Instructions (Addendum)
We will refer you to the ear doctors for an evaluation   Follow-up in 2 weeks or if your symptoms worsen

## 2012-07-21 ENCOUNTER — Encounter: Payer: Self-pay | Admitting: Family Medicine

## 2012-07-21 ENCOUNTER — Ambulatory Visit (INDEPENDENT_AMBULATORY_CARE_PROVIDER_SITE_OTHER): Payer: Medicare Other | Admitting: Family Medicine

## 2012-07-21 VITALS — BP 125/70 | HR 60 | Temp 97.7°F | Wt 221.0 lb

## 2012-07-21 DIAGNOSIS — F17200 Nicotine dependence, unspecified, uncomplicated: Secondary | ICD-10-CM

## 2012-07-21 DIAGNOSIS — Z720 Tobacco use: Secondary | ICD-10-CM

## 2012-07-21 DIAGNOSIS — R42 Dizziness and giddiness: Secondary | ICD-10-CM

## 2012-07-21 NOTE — Progress Notes (Signed)
  Subjective:    Patient ID: Bradley Meadows, male    DOB: 07/25/1940, 72 y.o.   MRN: [id number]  HPI  # Follow-up dizziness It is improved for the past week. He is not sure what made it better.  He has not seen ENT yet.   He brings dimenhydrinate 50 mg which he says sometimes helps (OTC motion sickness medication); he takes 3 times a day.   He is a little dizzy this morning. It was there all the time in the past. It is "nothing compared to what it usually was".   He saw ENT last week. They checked his throat and hearing; he was told his hearing was "really in good shape".  He is not sure when he needs to follow-up. He was not started on any medications.   Review of Systems Per HPI Denies tremors, gait changes, vision problems, worsening hearing problems, tinnitus  Allergies, medication, past medical history reviewed.  Smoking status noted.     Objective:   Physical Exam GEN: NAD; well-appearing NEURO: bilateral mild resting tremor; gait normal non antalgic; normal finger to nose; negative Romberg PULM: NI WOB CV: RRR    Assessment & Plan:

## 2012-07-21 NOTE — Assessment & Plan Note (Signed)
He says he may try to quit tomorrow or this weekend. He declines smoking cessation counseling visit with Dr. Raymondo Band because he was able to quit without help in the past.

## 2012-07-21 NOTE — Assessment & Plan Note (Signed)
Improved. Reason unclear. He is seeing ENT. He will call regarding follow-up with them to discuss testing results. Follow-up prn. Continue OTC motion sickness medication prn since it seems to be helping.

## 2012-07-21 NOTE — Patient Instructions (Addendum)
Follow-up in 6 months or sooner if needed.   Call Dr. Odis Luster today and ask when you should follow-up.   Tetanus shot today

## 2012-08-29 ENCOUNTER — Other Ambulatory Visit: Payer: Self-pay | Admitting: Family Medicine

## 2012-09-03 ENCOUNTER — Other Ambulatory Visit: Payer: Self-pay | Admitting: Family Medicine

## 2012-09-10 ENCOUNTER — Telehealth: Payer: Self-pay | Admitting: Family Medicine

## 2012-09-10 DIAGNOSIS — I10 Essential (primary) hypertension: Secondary | ICD-10-CM

## 2012-09-10 MED ORDER — METOPROLOL TARTRATE 25 MG PO TABS: 25.0000 mg | ORAL_TABLET | Freq: Two times a day (BID) | ORAL | Status: DC

## 2012-09-10 NOTE — Telephone Encounter (Signed)
Will route to MD - looks as if the last refill requested appointment before it was filled again. Wyatt Haste, RN-BSN

## 2012-09-10 NOTE — Telephone Encounter (Signed)
Pt is requesting a refill on metoprolol be sent to his pharmacy on file. He went there to have it refilled and since Dr. Madolyn Frieze is longer his doctor they would not fill it. Myriam Jacobson

## 2012-10-06 ENCOUNTER — Other Ambulatory Visit: Payer: Self-pay | Admitting: *Deleted

## 2012-10-06 DIAGNOSIS — I10 Essential (primary) hypertension: Secondary | ICD-10-CM

## 2012-10-06 DIAGNOSIS — I5022 Chronic systolic (congestive) heart failure: Secondary | ICD-10-CM

## 2012-10-06 DIAGNOSIS — I2510 Atherosclerotic heart disease of native coronary artery without angina pectoris: Secondary | ICD-10-CM

## 2012-10-06 MED ORDER — LISINOPRIL-HYDROCHLOROTHIAZIDE 20-12.5 MG PO TABS: 1.0000 | ORAL_TABLET | Freq: Every day | ORAL | Status: DC

## 2012-10-07 ENCOUNTER — Telehealth: Payer: Self-pay | Admitting: Family Medicine

## 2012-10-07 DIAGNOSIS — J441 Chronic obstructive pulmonary disease with (acute) exacerbation: Secondary | ICD-10-CM

## 2012-10-07 DIAGNOSIS — I10 Essential (primary) hypertension: Secondary | ICD-10-CM

## 2012-10-07 DIAGNOSIS — J449 Chronic obstructive pulmonary disease, unspecified: Secondary | ICD-10-CM

## 2012-10-07 NOTE — Telephone Encounter (Signed)
Pt called and would like a enough of refill for metoprolol and his inhaler to get him to his appointment on 9/8/. JW

## 2012-10-07 NOTE — Telephone Encounter (Signed)
Will forward to MD. Malacki Mcphearson,CMA  

## 2012-10-08 MED ORDER — METOPROLOL TARTRATE 25 MG PO TABS: 25.0000 mg | ORAL_TABLET | Freq: Two times a day (BID) | ORAL | Status: DC

## 2012-10-08 MED ORDER — IPRATROPIUM-ALBUTEROL 20-100 MCG/ACT IN AERS: 1.0000 | INHALATION_SPRAY | Freq: Four times a day (QID) | RESPIRATORY_TRACT | Status: DC | PRN

## 2012-10-08 NOTE — Telephone Encounter (Signed)
LM with male who answered the phone.  He will have Mr. Craun call back.  Please inform the meds were sent to pharmacy. [place], Maryjo Rochester

## 2012-10-11 ENCOUNTER — Ambulatory Visit (INDEPENDENT_AMBULATORY_CARE_PROVIDER_SITE_OTHER): Payer: Medicare Other | Admitting: Family Medicine

## 2012-10-11 ENCOUNTER — Encounter: Payer: Self-pay | Admitting: Family Medicine

## 2012-10-11 VITALS — BP 146/52 | HR 62 | Temp 97.7°F | Wt 216.0 lb

## 2012-10-11 DIAGNOSIS — J449 Chronic obstructive pulmonary disease, unspecified: Secondary | ICD-10-CM

## 2012-10-11 DIAGNOSIS — Z23 Encounter for immunization: Secondary | ICD-10-CM

## 2012-10-11 DIAGNOSIS — F17200 Nicotine dependence, unspecified, uncomplicated: Secondary | ICD-10-CM

## 2012-10-11 DIAGNOSIS — Z720 Tobacco use: Secondary | ICD-10-CM

## 2012-10-11 DIAGNOSIS — I10 Essential (primary) hypertension: Secondary | ICD-10-CM

## 2012-10-11 NOTE — Patient Instructions (Addendum)
Blood pressure looks fine today  I am glad you want to quit smoking. I think it will be very valuable for your health.   For your COPD, please think over taking a medicine regularly to help control your symptoms better.   See me at least every 6 months,  Dr. Durene Cal  Health Maintenance Due  Topic Date Due  . Colonoscopy  02/05/1990  . Influenza Vaccine -given today 09/03/2012

## 2012-10-12 ENCOUNTER — Encounter: Payer: Self-pay | Admitting: Family Medicine

## 2012-10-12 NOTE — Assessment & Plan Note (Signed)
Isolated systolic hypertension though with age will set goal at less than 150. Continue current medications (lisinopril-hctz and metoprolol) and continue to monitor

## 2012-10-12 NOTE — Assessment & Plan Note (Addendum)
Poorly controlled with albuterol use 2-3 x a day. Patient needs to quit smoking and this was discussed. Also discussed long acting beta agonist but patient is resistant. Will readdress next visit.   Need beta blocker for history CHF but could make COPD worse. Continue for now.

## 2012-10-12 NOTE — Progress Notes (Signed)
  [facility] Tana Conch, MD Phone: [phone]  Subjective:  Chief complaint-noted  # COPD/tobacco abuse Patient states uses combivent 2-3 x a day due to dyspnea on exertion. TOday, no shortness of breath at rest. States walks slower than there his age due to shortness of breath. Denies chest pain. Symptoms improve with rest and albuterol.   1 ppd. Wants to quit smoking 8/10 importance as it makes him "feel better" and have more energy. 8/10 readiness. 9/10 confidence. QUit for 8 years previously from 2004-2012. Wants to do it on his own. Stopped cold [place] previously.   # Hypertension BP Readings from Last 3 Encounters:  10/11/12 146/52  07/21/12 125/70  07/07/12 127/67   Home BP monitoring-no Compliant with medications-yes without side effects, lisinopril-hctz and metoprolol Denies any CP, HA, blurry vision, LE edema, transient weakness, orthopnea, PND.   ROS--See HPI  Past Medical History Patient Active Problem List   Diagnosis Date Noted  . Anemia 06/03/2012  . Chronic hyponatremia 06/03/2012  . Dizziness 05/26/2012  . Tobacco abuse 08/29/2010  . Mixed hyperlipidemia 03/26/2007  . Chronic systolic heart failure 12/03/2006  . CAD 09/04/2006  . COPD 04/21/2006  . ERECTILE DYSFUNCTION 04/02/2006  . HYPERTENSION, BENIGN SYSTEMIC 04/02/2006  . OSTEOARTHRITIS, MULTI SITES 04/02/2006   Reviewed problem list.  Medications- reviewed and updated Current Outpatient Prescriptions on File Prior to Visit  Medication Sig Dispense Refill  . aspirin 81 MG tablet Take 1 tablet (81 mg total) by mouth daily.  1 tablet  3  . Diclofenac Sodium CR 100 MG 24 hr tablet Take 100 mg by mouth daily.      . ferrous sulfate 325 (65 FE) MG tablet Take 1 tablet (325 mg total) by mouth daily with breakfast.  90 tablet  3  . Ipratropium-Albuterol (COMBIVENT RESPIMAT) 20-100 MCG/ACT AERS respimat Inhale 1 puff into the lungs every 6 (six) hours as needed for wheezing.  4 g   5  . lisinopril-hydrochlorothiazide (PRINZIDE,ZESTORETIC) 20-12.5 MG per tablet Take 1 tablet by mouth daily.  90 tablet  4  . metoprolol tartrate (LOPRESSOR) 25 MG tablet Take 1 tablet (25 mg total) by mouth 2 (two) times daily.  180 tablet  3  . omeprazole (PRILOSEC) 20 MG capsule TAKE ONE CAPSULE BY MOUTH DAILY  30 capsule  11  . simvastatin (ZOCOR) 20 MG tablet Take 1 tablet (20 mg total) by mouth at bedtime.  90 tablet  4  . sildenafil (VIAGRA) 25 MG tablet Take 1 tablet (25 mg total) by mouth daily as needed.  2 tablet  12   No current facility-administered medications on file prior to visit.    Objective: BP 146/52  Pulse 62  Temp(Src) 97.7 F (36.5 C) (Oral)  Wt 216 lb (97.977 kg)  BMI 28.11 kg/m2 Gen: NAD, resting comfortably in chair CV: RRR no murmurs rubs or gallops Lungs: CTAB no crackles, wheeze, rhonchi, no obvious respiratory distress when sitting or walking out of office.  Skin: warm, dry Neuro: grossly normal, moves all extremities Ext: trace edema  Wt down 6 lbs since June  Assessment/Plan:  Health Maintenance Due  Topic Date Due  . Colonoscopy -patient states does not want because he is "too old". Discussed recommendations up to age 79 for this and he will consider. Wants to continue stool cards for now with last ones in May.  02/05/1990

## 2012-10-12 NOTE — Assessment & Plan Note (Signed)
Counseling given. Ready to quit and states may do it tomorrow or next week but denies need for support. Gave card for [email or web address].

## 2013-01-14 ENCOUNTER — Encounter: Payer: Self-pay | Admitting: Family Medicine

## 2013-01-14 ENCOUNTER — Ambulatory Visit (INDEPENDENT_AMBULATORY_CARE_PROVIDER_SITE_OTHER): Payer: Medicare Other | Admitting: Family Medicine

## 2013-01-14 VITALS — BP 139/66 | HR 66 | Temp 97.8°F | Wt 212.9 lb

## 2013-01-14 DIAGNOSIS — Z720 Tobacco use: Secondary | ICD-10-CM

## 2013-01-14 DIAGNOSIS — J449 Chronic obstructive pulmonary disease, unspecified: Secondary | ICD-10-CM

## 2013-01-14 DIAGNOSIS — K4090 Unilateral inguinal hernia, without obstruction or gangrene, not specified as recurrent: Secondary | ICD-10-CM

## 2013-01-14 DIAGNOSIS — F17200 Nicotine dependence, unspecified, uncomplicated: Secondary | ICD-10-CM

## 2013-01-14 DIAGNOSIS — K4030 Unilateral inguinal hernia, with obstruction, without gangrene, not specified as recurrent: Secondary | ICD-10-CM | POA: Insufficient documentation

## 2013-01-14 NOTE — Assessment & Plan Note (Addendum)
Patient would like to pursue surgical options (at least meet with surgery) but wants to wait until after the new year to do this (he plans to call me when he is ready). I have discussed with patient the risks of waiting and the fact that over time, this will likely enlarge and place him at risk of needing an emergent surgery if ever incarcerated. Discussed pain/discomfort may also increase.   Also discussed need to maximize him for surgery by quitting smoking. He is in agreement to this. Given history of Systolic CHF and CAD, patient will also need cardiology evaluation if surgeon and patient want to move forward with surgery. i am happy to arrange this (patient requests [facility] and states he has had at least one stress test with [place] though he was previously cared for by a solo cardiologist to whom he requests not to return) if surgeon's agree with my assessment and believe patient can benefit from surgery.   Precepted with Dr. Randolm Idol who agrees with assessment and management.

## 2013-01-14 NOTE — Patient Instructions (Signed)
We found a hernia today in your groin. We are going to send you to see the surgeons to discuss your options after the Holidays per your request. If things start to bother you more, we can order the referral before then. We are also going to need you to see your heart doctor but we can get that set up once you see the surgeon.   Hernia A hernia occurs when an internal organ pushes out through a weak spot in the abdominal wall. Hernias most commonly occur in the groin and around the navel. Hernias often can be pushed back into place (reduced). Most hernias tend to get worse over time. Some abdominal hernias can get stuck in the opening (irreducible or incarcerated hernia) and cannot be reduced. An irreducible abdominal hernia which is tightly squeezed into the opening is at risk for impaired blood supply (strangulated hernia). A strangulated hernia is a medical emergency. Because of the risk for an irreducible or strangulated hernia, surgery may be recommended to repair a hernia. CAUSES   Heavy lifting.  Prolonged coughing.  Straining to have a bowel movement.  A cut (incision) made during an abdominal surgery. HOME CARE INSTRUCTIONS   Bed rest is not required. You may continue your normal activities.  Avoid lifting more than 10 pounds (4.5 kg) or straining.  Cough gently. If you are a smoker it is best to stop. Even the best hernia repair can break down with the continual strain of coughing. Even if you do not have your hernia repaired, a cough will continue to aggravate the problem.  Do not wear anything tight over your hernia. Do not try to keep it in with an outside bandage or truss. These can damage abdominal contents if they are trapped within the hernia sac.  Eat a normal diet.  Avoid constipation. Straining over long periods of time will increase hernia size and encourage breakdown of repairs. If you cannot do this with diet alone, stool softeners may be used. SEEK IMMEDIATE MEDICAL  CARE IF:   You have a fever.  You develop increasing abdominal pain.  You feel nauseous or vomit.  Your hernia is stuck outside the abdomen, looks discolored, feels hard, or is tender.  You have any changes in your bowel habits or in the hernia that are unusual for you.  You have increased pain or swelling around the hernia.  You cannot push the hernia back in place by applying gentle pressure while lying down. MAKE SURE YOU:   Understand these instructions.  Will watch your condition.  Will get help right away if you are not doing well or get worse. Document Released: 01/20/2005 Document Revised: 04/14/2011 Document Reviewed: 09/09/2007 [facility] Patient Information 2014 [place], [place].

## 2013-01-14 NOTE — Progress Notes (Signed)
Tana Conch, MD Phone: [phone]  Subjective:  Chief complaint-noted  HERNIA (discomfort, hard bulge in groin)  [facility] is an 72 y.o. male who was presenting for evaluation of a  groin lump. Symptoms were first noted 2 weeks ago. Pain is dull, intermittent (mainly just a discomfort) and is reducible (states he feels a watery sound when he pushes the hard bulge back in and it is slightly squishy). Patient states if he coughs or is standing or working hard, he feels a bulge in the left side of his groin 1-2 times a day The patient has no symptoms of  chronic constipation, chronic cough, difficulty urinating. There was not previous hx of groin surgery but patient did once have a hernia in his upper abdomen after a surgery that he had for a stomach ulceration.   Tobacco abuse/COPD Patient is an active smoker at 4-5 cigarettes per day. Quit in 2012. Knows he needs to quit before any surgery. 10/10 confidence. Plans to uit over the weekend. Uses combivent 2-3 times a day when used sparingly before he started smoking again. Still resistant to controller medicine.   ROS-no nausea/vomiting. No fever or chills. No chest pain. Some sob as above.   Past Medical History Patient Active Problem List   Diagnosis Date Noted  . Tobacco abuse 08/29/2010    Priority: High  . Mixed hyperlipidemia 03/26/2007    Priority: Medium  . Chronic systolic heart failure 12/03/2006    Priority: Medium  . CAD 09/04/2006    Priority: Medium  . COPD 04/21/2006    Priority: Medium  . HYPERTENSION, BENIGN SYSTEMIC 04/02/2006    Priority: Medium  . Anemia 06/03/2012    Priority: Low  . Chronic hyponatremia 06/03/2012  . Dizziness 05/26/2012  . ERECTILE DYSFUNCTION 04/02/2006  . OSTEOARTHRITIS, MULTI SITES 04/02/2006    Medications- reviewed and updated Current Outpatient Prescriptions  Medication Sig Dispense Refill  . aspirin 81 MG tablet Take 1 tablet (81 mg total) by mouth daily.  1 tablet  3  .  Diclofenac Sodium CR 100 MG 24 hr tablet Take 100 mg by mouth daily.      . ferrous sulfate 325 (65 FE) MG tablet Take 1 tablet (325 mg total) by mouth daily with breakfast.  90 tablet  3  . Ipratropium-Albuterol (COMBIVENT RESPIMAT) 20-100 MCG/ACT AERS respimat Inhale 1 puff into the lungs every 6 (six) hours as needed for wheezing.  4 g  5  . lisinopril-hydrochlorothiazide (PRINZIDE,ZESTORETIC) 20-12.5 MG per tablet Take 1 tablet by mouth daily.  90 tablet  4  . metoprolol tartrate (LOPRESSOR) 25 MG tablet Take 1 tablet (25 mg total) by mouth 2 (two) times daily.  180 tablet  3  . omeprazole (PRILOSEC) 20 MG capsule TAKE ONE CAPSULE BY MOUTH DAILY  30 capsule  11  . sildenafil (VIAGRA) 25 MG tablet Take 1 tablet (25 mg total) by mouth daily as needed.  2 tablet  12  . simvastatin (ZOCOR) 20 MG tablet Take 1 tablet (20 mg total) by mouth at bedtime.  90 tablet  4   No current facility-administered medications for this visit.    Objective: BP 139/66  Pulse 66  Temp(Src) 97.8 F (36.6 C) (Oral)  Wt 212 lb 14.4 oz (96.571 kg) Gen: NAD, resting comfortably while seated, appears slight uncomfortable when standing and puts hand towards left groin (though states this is not as bad as usual) CV: RRR no murmurs rubs or gallops Lungs: CTAB no crackles, wheeze, rhonchi  Ext: no edema Skin/MSK: patient with palpable bulge in left inguinal region with cough with area about 3-4 x 3-4 cm in size. No palpable ring.   Assessment/Plan:  Inguinal hernia Patient would like to pursue surgical options (at least meet with surgery) but wants to wait until after the new year to do this (he plans to call me when he is ready). I have discussed with patient the risks of waiting and the fact that over time, this will likely enlarge and place him at risk of needing an emergent surgery if ever incarcerated. Discussed pain/discomfort may also increase.   Also discussed need to maximize him for surgery by quitting  smoking. He is in agreement to this. Given history of Systolic CHF and CAD, patient will also need cardiology evaluation if surgeon and patient want to move forward with surgery. i am happy to arrange this (patient requests [facility] and states he has had at least one stress test with [facility] though he was previously cared for by a solo cardiologist to whom he requests not to return) if surgeon's agree with my assessment and believe patient can benefit from surgery.   Tobacco abuse Advised cessation. Patient plans to quit this weekend withotu assistance. Currently smoking 4-5 cigarettes per day  COPD Poorly controlled. Wants to quit smoking and see if this helps before adding a controller medicine

## 2013-01-14 NOTE — Assessment & Plan Note (Signed)
Advised cessation. Patient plans to quit this weekend withotu assistance. Currently smoking 4-5 cigarettes per day

## 2013-01-14 NOTE — Assessment & Plan Note (Signed)
Poorly controlled. Wants to quit smoking and see if this helps before adding a controller medicine

## 2013-04-13 ENCOUNTER — Other Ambulatory Visit: Payer: Self-pay | Admitting: Family Medicine

## 2013-06-11 ENCOUNTER — Other Ambulatory Visit: Payer: Self-pay | Admitting: Family Medicine

## 2013-06-22 ENCOUNTER — Encounter: Payer: Self-pay | Admitting: Family Medicine

## 2013-06-22 ENCOUNTER — Other Ambulatory Visit: Payer: Self-pay | Admitting: Family Medicine

## 2013-06-22 ENCOUNTER — Ambulatory Visit (INDEPENDENT_AMBULATORY_CARE_PROVIDER_SITE_OTHER): Payer: Medicare Other | Admitting: Family Medicine

## 2013-06-22 VITALS — BP 125/70 | HR 73 | Temp 98.2°F | Wt 215.0 lb

## 2013-06-22 DIAGNOSIS — F3289 Other specified depressive episodes: Secondary | ICD-10-CM | POA: Diagnosis not present

## 2013-06-22 DIAGNOSIS — Z720 Tobacco use: Secondary | ICD-10-CM

## 2013-06-22 DIAGNOSIS — F17200 Nicotine dependence, unspecified, uncomplicated: Secondary | ICD-10-CM

## 2013-06-22 DIAGNOSIS — F329 Major depressive disorder, single episode, unspecified: Secondary | ICD-10-CM | POA: Diagnosis not present

## 2013-06-22 DIAGNOSIS — F32A Depression, unspecified: Secondary | ICD-10-CM

## 2013-06-22 MED ORDER — BUPROPION HCL ER (XL) 150 MG PO TB24: 150.0000 mg | ORAL_TABLET | Freq: Every day | ORAL | Status: DC

## 2013-06-22 MED ORDER — NICOTINE 21 MG/24HR TD PT24: 21.0000 mg | MEDICATED_PATCH | Freq: Every day | TRANSDERMAL | Status: DC

## 2013-06-22 MED ORDER — NICOTINE POLACRILEX 2 MG MT GUM: 2.0000 mg | CHEWING_GUM | OROMUCOSAL | Status: DC | PRN

## 2013-06-22 NOTE — Assessment & Plan Note (Signed)
Wellbutrin for depression should also help with smoking cessation. Plan for nicotine patches and gum and follow up in 1 week in addition. Titrate down to 14 mcg patch in 2-4 weeks.

## 2013-06-22 NOTE — Progress Notes (Signed)
Bradley Reddish, MD Phone: [phone]  Subjective:  Chief complaint-noted  Concern for Depression  HPI:  Onset/Duration (>2 weeks required): around Christmas 2014 Factors: unable to quit smoking previously which discouraged him, then feels worse because not smoking, been divorced for 30 years, difficult situation with girlfriend who is bipolar who he plans to stop seeing soon  Symptoms (SIGECAPS):   1. Depressed Mood: Yes (required)  2. Decreased Interest: Yes (required) and doesn't want to be around people like normal  3. SI/HI: no  4. PHQ9 9  5. MDQ not indicated though does have history of alchol abuse 30 years ago but has not relapsed.    6. Level of Impairment (What do these symptoms get in the way of you doing) severe, doesn't even enjoy yardwork anymore  Medical History including Psychiatric   1. Ever on psych meds: no   Name of meds and # failures: n/a  2. Psych history/ever hospitalized: no  3. Alcohol drinks per week: 0; smoking pack per day; other drugs: no  4. History of thyroid disease or anemia: no  5. History of traumatic event (PTSD?): no   Specifically abuse: no  Tobacco abuse Now up to 1 PPD. Desires to quit and will start tomorrow with patches gum ROS- no chest pain. Does have some shortness of breath intermittently and uses combivent with known history of COPD. This is worse since he started smoking again.   Patient Active Problem List   Diagnosis Date Noted  . Depression 06/22/2013    Priority: High  . Tobacco abuse 08/29/2010    Priority: High  . Mixed hyperlipidemia 03/26/2007    Priority: Medium  . Chronic systolic heart failure 08/67/6195    Priority: Medium  . CAD 09/04/2006    Priority: Medium  . COPD 04/21/2006    Priority: Medium  . HYPERTENSION, BENIGN SYSTEMIC 04/02/2006    Priority: Medium  . Anemia 06/03/2012    Priority: Low  . Inguinal hernia 01/14/2013  . Chronic hyponatremia 06/03/2012  . Dizziness 05/26/2012  . ERECTILE  DYSFUNCTION 04/02/2006  . OSTEOARTHRITIS, MULTI SITES 04/02/2006    Family history:  1. Any history of psychiatric issues: no  2. Specifically bipolar: no  Medications- reviewed and updated Current Outpatient Prescriptions  Medication Sig Dispense Refill  . aspirin 81 MG tablet Take 1 tablet (81 mg total) by mouth daily.  1 tablet  3  . buPROPion (WELLBUTRIN XL) 150 MG 24 hr tablet TAKE 1 TABLET BY MOUTH DAILY  90 tablet  0  . COMBIVENT RESPIMAT 20-100 MCG/ACT AERS respimat INHAEL 1 PUFF BY MOUTH EVERY 6 HOURS AS NEEDED FOR WHEEZING  4 g  5  . Diclofenac Sodium CR 100 MG 24 hr tablet Take 100 mg by mouth daily.      . ferrous sulfate 325 (65 FE) MG tablet TAKE 1 TABLET BY MOUTH DAILY WITH BREAKFAST  90 tablet  0  . lisinopril-hydrochlorothiazide (PRINZIDE,ZESTORETIC) 20-12.5 MG per tablet Take 1 tablet by mouth daily.  90 tablet  4  . metoprolol tartrate (LOPRESSOR) 25 MG tablet Take 1 tablet (25 mg total) by mouth 2 (two) times daily.  180 tablet  3  . nicotine (NICODERM CQ - DOSED IN MG/24 HOURS) 21 mg/24hr patch Place 1 patch (21 mg total) onto the skin daily.  28 patch  0  . nicotine polacrilex (NICORETTE) 2 MG gum Take 1 each (2 mg total) by mouth as needed for smoking cessation.  100 tablet  0  . omeprazole (PRILOSEC)  20 MG capsule TAKE ONE CAPSULE BY MOUTH DAILY  30 capsule  11  . sildenafil (VIAGRA) 25 MG tablet Take 1 tablet (25 mg total) by mouth daily as needed.  2 tablet  12  . simvastatin (ZOCOR) 20 MG tablet Take 1 tablet (20 mg total) by mouth at bedtime.  90 tablet  4   No current facility-administered medications for this visit.    Objective: BP 125/70  Pulse 73  Temp(Src) 98.2 F (36.8 C) (Oral)  Wt 215 lb (97.523 kg) Gen: NAD, resting comfortably Psych: depressed mood  Recent TSH:  No results found for this basename: TSH   Recent CBC (Hgb):  Lab Results  Component Value Date   HGB 14.0 05/26/2012   Recent CMET (baseline):   Chemistry      Component  Value Date/Time   NA 127* 05/26/2012 1141   K 4.2 05/26/2012 1141   CL 93* 05/26/2012 1141   CO2 25 05/26/2012 1141   BUN 11 05/26/2012 1141   CREATININE 0.89 05/26/2012 1141   CREATININE 0.77 11/09/2009 2020      Component Value Date/Time   CALCIUM 9.1 05/26/2012 1141   ALKPHOS 88 05/26/2012 1141   AST 15 05/26/2012 1141   ALT 9 05/26/2012 1141   BILITOT 0.5 05/26/2012 1141     Baseline EKG if over 40: QT prolongation not evaluated, but will not prescribe SSRI at this time  Assessment/Plan:  Tobacco abuse Wellbutrin for depression should also help with smoking cessation. Plan for nicotine patches and gum and follow up in 1 week in addition. Titrate down to 14 mcg patch in 2-4 weeks.   Depression PHQ9 15-19: Moderately Severe Depression-discussed need for medication and counseling/CBT. Patient in agreement to start Medication. Opts for wellbutrin 150 mg XL given desire to quit smoking, avoid sexual side effects, and desire to maintain or lose small amount of weight.  Also, patient with hyponatremia history so want to avoid SSRI. Will follow up 1 week until improving then on 2-4 week basis using PHQ9. Regarding medication, safety (SI), tolerability (SE), and efficacy (repeat PHQ9)  at next visit and titrate up as needed (plan for BID dosing).  Regarding SI, patient denies. -MDQ not indicated -Will obtain baseline labs, TSH and CMP next week (had intended to today). -Baseline EKG if over 40-QT prolongation: Not indicated as not prescribing SSRI -Patient not currently interested in counseling/therapy and does not want to meet with anyone to discuss other than PCP -Discussed remission may take several months. We will reassesses medication/therapy needs after 6 months of remission as this is first depressive episode.      Meds ordered this encounter  Medications  .  buPROPion (WELLBUTRIN XL) 150 MG 24 hr tablet    Sig: Take 1 tablet (150 mg total) by mouth daily.    Dispense:  30 tablet     Refill:  0  . nicotine (NICODERM CQ - DOSED IN MG/24 HOURS) 21 mg/24hr patch    Sig: Place 1 patch (21 mg total) onto the skin daily.    Dispense:  28 patch    Refill:  0    Generic version please that is covered by medicaid  . nicotine polacrilex (NICORETTE) 2 MG gum    Sig: Take 1 each (2 mg total) by mouth as needed for smoking cessation.    Dispense:  100 tablet    Refill:  0

## 2013-06-22 NOTE — Patient Instructions (Addendum)
See me back in 1 week to follow up  AVS (depressionmed):  Taking the medicine as directed and not missing any doses is one of the best things you can do to treat your depression.  Here are some things to keep in mind:  1) Side effects (stomach upset, some increased anxiety) may happen before you notice a benefit.  These side effects typically go away over time. 2) Changes to your dose of medicine or a change in medication all together is sometimes necessary 3) Most people need to be on medication at least 6-12 months 4) Many people will notice an improvement within two weeks but the full effect of the medication can take up to 4-6 weeks 5) Stopping the medication when you start feeling better often results in a return of symptoms 6) If you start having thoughts of hurting yourself or others after starting this medicine, please call me at [phone] immediately.

## 2013-06-22 NOTE — Assessment & Plan Note (Signed)
PHQ9 15-19: Moderately Severe Depression-discussed need for medication and counseling/CBT. Patient in agreement to start Medication. Opts for wellbutrin 150 mg XL given desire to quit smoking, avoid sexual side effects, and desire to maintain or lose small amount of weight.  Also, patient with hyponatremia history so want to avoid SSRI. Will follow up 1 week until improving then on 2-4 week basis using PHQ9. Regarding medication, safety (SI), tolerability (SE), and efficacy (repeat PHQ9)  at next visit and titrate up as needed (plan for BID dosing).  Regarding SI, patient denies. -MDQ not indicated -Will obtain baseline labs, TSH and CMP next week (had intended to today). -Baseline EKG if over 40-QT prolongation: Not indicated as not prescribing SSRI -Patient not currently interested in counseling/therapy and does not want to meet with anyone to discuss other than PCP -Discussed remission may take several months. We will reassesses medication/therapy needs after 6 months of remission as this is first depressive episode.

## 2013-06-30 ENCOUNTER — Encounter: Payer: Self-pay | Admitting: Family Medicine

## 2013-06-30 ENCOUNTER — Ambulatory Visit (INDEPENDENT_AMBULATORY_CARE_PROVIDER_SITE_OTHER): Payer: Medicare Other | Admitting: Family Medicine

## 2013-06-30 VITALS — BP 131/72 | HR 66 | Temp 98.4°F | Ht 73.0 in | Wt 220.0 lb

## 2013-06-30 DIAGNOSIS — I5022 Chronic systolic (congestive) heart failure: Secondary | ICD-10-CM | POA: Diagnosis not present

## 2013-06-30 DIAGNOSIS — I10 Essential (primary) hypertension: Secondary | ICD-10-CM | POA: Diagnosis not present

## 2013-06-30 DIAGNOSIS — F17200 Nicotine dependence, unspecified, uncomplicated: Secondary | ICD-10-CM

## 2013-06-30 DIAGNOSIS — R609 Edema, unspecified: Secondary | ICD-10-CM

## 2013-06-30 DIAGNOSIS — F329 Major depressive disorder, single episode, unspecified: Secondary | ICD-10-CM | POA: Diagnosis not present

## 2013-06-30 DIAGNOSIS — F3289 Other specified depressive episodes: Secondary | ICD-10-CM | POA: Diagnosis not present

## 2013-06-30 DIAGNOSIS — Z720 Tobacco use: Secondary | ICD-10-CM

## 2013-06-30 DIAGNOSIS — F32A Depression, unspecified: Secondary | ICD-10-CM

## 2013-06-30 LAB — CBC
HEMATOCRIT: 39.7 % (ref 39.0–52.0)
Hemoglobin: 13.8 g/dL (ref 13.0–17.0)
MCH: 31 pg (ref 26.0–34.0)
MCHC: 34.8 g/dL (ref 30.0–36.0)
MCV: 89.2 fL (ref 78.0–100.0)
Platelets: 217 10*3/uL (ref 150–400)
RBC: 4.45 MIL/uL (ref 4.22–5.81)
RDW: 14.3 % (ref 11.5–15.5)
WBC: 7.9 10*3/uL (ref 4.0–10.5)

## 2013-06-30 LAB — LIPID PANEL
CHOLESTEROL: 218 mg/dL — AB (ref 0–200)
HDL: 41 mg/dL (ref >39)
LDL CALC: 140 mg/dL — AB (ref 0–99)
Total CHOL/HDL Ratio: 5.3 Ratio
Triglycerides: 184 mg/dL — ABNORMAL HIGH (ref <150)
VLDL: 37 mg/dL (ref 0–40)

## 2013-06-30 LAB — COMPREHENSIVE METABOLIC PANEL
ALT: 11 U/L (ref 0–53)
AST: 15 U/L (ref 0–37)
Albumin: 2.9 g/dL — ABNORMAL LOW (ref 3.5–5.2)
Alkaline Phosphatase: 73 U/L (ref 39–117)
BUN: 9 mg/dL (ref 6–23)
CALCIUM: 8.1 mg/dL — AB (ref 8.4–10.5)
CHLORIDE: 95 meq/L — AB (ref 96–112)
CO2: 29 mEq/L (ref 19–32)
Creat: 0.9 mg/dL (ref 0.50–1.35)
Glucose, Bld: 94 mg/dL (ref 70–99)
Potassium: 4.1 mEq/L (ref 3.5–5.3)
Sodium: 129 mEq/L — ABNORMAL LOW (ref 135–145)
Total Bilirubin: 0.3 mg/dL (ref 0.2–1.2)
Total Protein: 4.8 g/dL — ABNORMAL LOW (ref 6.0–8.3)

## 2013-06-30 LAB — TSH: TSH: 2.714 u[IU]/mL (ref 0.350–4.500)

## 2013-06-30 NOTE — Progress Notes (Signed)
Garret Reddish, MD Phone: [phone]  Subjective:   Bradley Meadows is a 73 y.o. year old very pleasant male patient who presents with the following:  Depression Started on Wellbutrin at last visit Efficacy-PHq 9 of 8 down from 54.  Safety- no si/hi Tolerability- started with edema before medication was started so not likely related. No other side effects ROS- states mood is much improved, no longer figity  Edema History of systolic CHF Patient states he noted last Saturday that he had increased edema in his legs while he was sitting on his porch. He states he has been sitting out much more lately and has not been very active. He has not had increasing shortness of breath since our last visit. Has gained some weight he thinks.  ROS-Denies any chest pain, headache, blurry vision, transient weakness, orthopnea, PND.   Past Medical History- depression, tobacco abuse, HLD, systolic CHF with EF 45 % in 2010 per problem list, history of CAD s/p stent, COPD, HTN, chronic hyponatremia  Medications- reviewed and updated Current Outpatient Prescriptions  Medication Sig Dispense Refill  . aspirin 81 MG tablet Take 1 tablet (81 mg total) by mouth daily.  1 tablet  3  . buPROPion (WELLBUTRIN XL) 150 MG 24 hr tablet TAKE 1 TABLET BY MOUTH DAILY  90 tablet  0  . COMBIVENT RESPIMAT 20-100 MCG/ACT AERS respimat INHAEL 1 PUFF BY MOUTH EVERY 6 HOURS AS NEEDED FOR WHEEZING  4 g  5  . Diclofenac Sodium CR 100 MG 24 hr tablet Take 100 mg by mouth daily.      . ferrous sulfate 325 (65 FE) MG tablet TAKE 1 TABLET BY MOUTH DAILY WITH BREAKFAST  90 tablet  0  . lisinopril-hydrochlorothiazide (PRINZIDE,ZESTORETIC) 20-12.5 MG per tablet Take 1 tablet by mouth daily.  90 tablet  4  . metoprolol tartrate (LOPRESSOR) 25 MG tablet Take 1 tablet (25 mg total) by mouth 2 (two) times daily.  180 tablet  3  . nicotine (NICODERM CQ - DOSED IN MG/24 HOURS) 21 mg/24hr patch Place 1 patch (21 mg total) onto the skin daily.   28 patch  0  . nicotine polacrilex (NICORETTE) 2 MG gum Take 1 each (2 mg total) by mouth as needed for smoking cessation.  100 tablet  0  . omeprazole (PRILOSEC) 20 MG capsule TAKE ONE CAPSULE BY MOUTH DAILY  30 capsule  11  . sildenafil (VIAGRA) 25 MG tablet Take 1 tablet (25 mg total) by mouth daily as needed.  2 tablet  12  . simvastatin (ZOCOR) 20 MG tablet Take 1 tablet (20 mg total) by mouth at bedtime.  90 tablet  4   No current facility-administered medications for this visit.    Objective: BP 131/72  Pulse 66  Temp(Src) 98.4 F (36.9 C) (Oral)  Ht 6\' 1"  (1.854 m)  Wt 220 lb (99.791 kg)  BMI 29.03 kg/m2 Gen: NAD, resting comfortably in chair CV: RRR no murmurs rubs or gallops, no jvd Lungs: CTAB no crackles, wheeze, rhonchi Abdomen: soft/nontender/nondistended/normal bowel sounds.  Ext: 2+ pitting edema Skin: warm, dry, no rash  Assessment/Plan:  Depression Drastic improvement from 16 to 8 on phq9 though patient attributes much of this to stress of old girlfriend moving away. Will follow up in 2-3 weeks. GIven improvement, patient requested not to titrate wellbutrin up at this time.   Tobacco abuse 3 cigarettes in last 5 days on patches and wellbutrin. Congratuled efforts. Follow up in 2-3 weeks.   Edema Chronic  systolic heart failure No signs of fluid overload other than edema which may be dependent and due to venous stasis. He has not had recent changes in his shortness of breath (other than some increase when he started smoking again some months ago) and has  Essentially no other symptoms. Gave patient warning signs. COntinue heart failure therapy with ace i and beta blocker. At end of visit, I had attributed this likely to venous stasis. On later review of weight up 5 lbs, edema, my concern for CHF has increased.  Once I get his cmet back and know his sodium level (history hyponatremia), may need to consider a short course of lasix with follow up next week. Also check  CBC and TSH as well as lipids with history hyperlipidemia as well as UA (protein)   Orders Placed This Encounter  Procedures  . CBC  . Comprehensive metabolic panel  . TSH  . Lipid panel  . POCT urinalysis dipstick

## 2013-06-30 NOTE — Patient Instructions (Signed)
Depression  Glad improved so much on medication and with moving out of your neighbor/girlfriend  Let's keep the same dose   Follow up in 2-4 weeks  Call us immediately if any thoughts of hurting yourself  Edema  Likely due to warm weather-would try elevating the legs or compression stockings you can get at the store (especially since started with sitting out in warm weather)  We are going to check some labs to be sure  We know your sodium has been low in the past so we will recheck that.   For your labs, I will send you a letter if there are no medication changes needed. I will call you if we need to discuss your lab results.  Orders Placed This Encounter  Procedures  . CBC  . Comprehensive metabolic panel  . TSH  . Lipid panel  . POCT urinalysis dipstick   Look forward to seeing you in 2-4 weeks, Dr. Yong Channel  Health Maintenance Due  Topic Date Due  . Colonoscopy  02/05/1990

## 2013-06-30 NOTE — Assessment & Plan Note (Addendum)
No signs of fluid overload other than edema which may be dependent and due to venous stasis. He has not had recent changes in his shortness of breath (other than some increase when he started smoking again some months ago) and has  Essentially no other symptoms. Gave patient warning signs. COntinue heart failure therapy with ace i and beta blocker. At end of visit, I had attributed this likely to venous stasis. On later review of weight up 5 lbs, edema, my concern for CHF has increased.  Once I get his cmet back and know his sodium level (history hyponatremia), may need to consider a short course of lasix with follow up next week. Also check CBC and TSH as well as lipids with history hyperlipidemia as well as UA (protein)

## 2013-06-30 NOTE — Assessment & Plan Note (Signed)
3 cigarettes in last 5 days on patches and wellbutrin. Congratuled efforts. Follow up in 2-3 weeks.

## 2013-06-30 NOTE — Assessment & Plan Note (Signed)
Drastic improvement from 16 to 8 on phq9 though patient attributes much of this to stress of old girlfriend moving away. Will follow up in 2-3 weeks. GIven improvement, patient requested not to titrate wellbutrin up at this time.

## 2013-07-01 ENCOUNTER — Telehealth: Payer: Self-pay | Admitting: Family Medicine

## 2013-07-01 ENCOUNTER — Other Ambulatory Visit: Payer: Self-pay | Admitting: Family Medicine

## 2013-07-01 LAB — POCT URINALYSIS DIPSTICK
Bilirubin, UA: NEGATIVE
Blood, UA: NEGATIVE
GLUCOSE UA: NEGATIVE
Ketones, UA: NEGATIVE
LEUKOCYTES UA: NEGATIVE
NITRITE UA: NEGATIVE
Protein, UA: 100
Spec Grav, UA: 1.015
Urobilinogen, UA: 0.2
pH, UA: 7

## 2013-07-01 LAB — POCT UA - MICROSCOPIC ONLY

## 2013-07-01 MED ORDER — FUROSEMIDE 40 MG PO TABS
40.0000 mg | ORAL_TABLET | Freq: Every day | ORAL | Status: DC
Start: 2013-07-01 — End: 2013-07-01

## 2013-07-01 NOTE — Telephone Encounter (Addendum)
LVM for patient to return call:  After further review of chart and vitals, I am concerned that his low sodium and new swelling may be related to his CHF. I would like for him to take lasix for the next 5 days and follow up with one of my colleagues early next week.   Do not have UA available in chart yet but mentioned by nursing staff that protein elevated in urine. Will need to recheck UA and CMET next week, consider BNP though likely improved on lasix.

## 2013-07-01 NOTE — Addendum Note (Signed)
Addended by: [place], Grizel Vesely on: 07/01/2013 03:38 PM   Modules accepted: Orders

## 2013-07-01 NOTE — Telephone Encounter (Signed)
Pt informed and agreeable to picking up lasix and seen Monday by Dr. Roslyn Smiling. Bradley Meadows

## 2013-07-04 ENCOUNTER — Ambulatory Visit (INDEPENDENT_AMBULATORY_CARE_PROVIDER_SITE_OTHER): Payer: Medicare Other | Admitting: Family Medicine

## 2013-07-04 ENCOUNTER — Encounter: Payer: Self-pay | Admitting: Family Medicine

## 2013-07-04 VITALS — BP 130/79 | HR 69 | Temp 98.4°F | Ht 73.0 in | Wt 214.0 lb

## 2013-07-04 DIAGNOSIS — R809 Proteinuria, unspecified: Secondary | ICD-10-CM | POA: Diagnosis not present

## 2013-07-04 DIAGNOSIS — I5022 Chronic systolic (congestive) heart failure: Secondary | ICD-10-CM

## 2013-07-04 LAB — COMPREHENSIVE METABOLIC PANEL
ALBUMIN: 3.1 g/dL — AB (ref 3.5–5.2)
ALT: 11 U/L (ref 0–53)
AST: 15 U/L (ref 0–37)
Alkaline Phosphatase: 74 U/L (ref 39–117)
BUN: 10 mg/dL (ref 6–23)
CO2: 27 mEq/L (ref 19–32)
Calcium: 8.8 mg/dL (ref 8.4–10.5)
Chloride: 93 mEq/L — ABNORMAL LOW (ref 96–112)
Creat: 0.87 mg/dL (ref 0.50–1.35)
Glucose, Bld: 97 mg/dL (ref 70–99)
POTASSIUM: 4 meq/L (ref 3.5–5.3)
SODIUM: 128 meq/L — AB (ref 135–145)
Total Bilirubin: 0.4 mg/dL (ref 0.2–1.2)
Total Protein: 5.4 g/dL — ABNORMAL LOW (ref 6.0–8.3)

## 2013-07-04 NOTE — Patient Instructions (Signed)
I am glad things are going better. Keep taking the Lasix for now. Try to weigh yourself at home if you have scales. Follow up with Dr. Yong Channel as scheduled. I think you should get another ultrasound of your heart in the near future.  I will call you with any abnormal lab results.  Allina Riches M. Semaj Coburn, M.D.

## 2013-07-04 NOTE — Assessment & Plan Note (Signed)
A: Improved on Lasix. No red flags. Wt down and otherwise stable.  P: - Cmet and UA today - Con't Lasix until seen by PCP - Consider repeat echo (Last one was by Dr. Terrence Dupont in 2010) - F/u with Dr. Yong Channel in 2 weeks, or return sooner if needed.

## 2013-07-04 NOTE — Progress Notes (Signed)
Patient ID: Bradley Meadows, male   DOB: Aug 13, 1940, 73 y.o.   MRN: [id number]    Subjective: HPI: Patient is a 73 y.o. male presenting to clinic today for follow up.  CHF- Patient seen by PCP (Dr. Yong Meadows) last week for SOB and edema. Noted to have 5lb weight gain, hyponatremia and protein in his urine. Concern for mild CHF exacerbation. Patient started on Lasix about 5 days ago. Weight down to 214 today for total of 6lbs. BP stable. He states his swelling is stable as well. He elevates his legs and the swelling goes down completely at night. He states he feels more tired than usual for the last 3-4 weeks, but he thinks that may be related to stress. He states he drinks a lot of soda (up to 12 cans per day Pepsi and Dr. Malachi Meadows) and is wondering if that could be contributing to his exacerbation. Patient currently denies CP, SOB, wheezing, cough, abd pain or swelling in upper extremities.  History Reviewed: Daily smoker. Health Maintenance: UTD other than colonoscopy  ROS: Please see HPI above.  Objective: Office vital signs reviewed. BP 130/79  Pulse 69  Temp(Src) 98.4 F (36.9 C) (Oral)  Ht 6\' 1"  (1.854 m)  Wt 214 lb (97.07 kg)  BMI 28.24 kg/m2  SpO2 97%   Physical Examination:  General: Awake, alert. NAD HEENT: Atraumatic, normocephalic. MMM Pulm: CTAB, no wheezes or crackles Cardio: RRR, no murmurs appreciated Abdomen:+BS, soft, nontender, nondistended Extremities: 1+ edema to the mid-shin Neuro: Grossly intact  Assessment: 73 y.o. male follow up  Plan: See Problem List and After Visit Summary

## 2013-07-05 ENCOUNTER — Other Ambulatory Visit (INDEPENDENT_AMBULATORY_CARE_PROVIDER_SITE_OTHER): Payer: Medicare Other | Admitting: Family Medicine

## 2013-07-05 ENCOUNTER — Telehealth: Payer: Self-pay | Admitting: Family Medicine

## 2013-07-05 DIAGNOSIS — R809 Proteinuria, unspecified: Secondary | ICD-10-CM | POA: Diagnosis not present

## 2013-07-05 LAB — POCT UA - MICROSCOPIC ONLY

## 2013-07-05 LAB — POCT URINALYSIS DIPSTICK
Bilirubin, UA: NEGATIVE
Glucose, UA: NEGATIVE
Ketones, UA: NEGATIVE
Leukocytes, UA: NEGATIVE
Nitrite, UA: NEGATIVE
Protein, UA: 100
Spec Grav, UA: 1.02
UROBILINOGEN UA: 0.2
pH, UA: 7

## 2013-07-05 NOTE — Telephone Encounter (Signed)
Please let Bradley Meadows know that his lab work looks fine. His sodium is still a little low, but it has been that way for a long time. His kidneys and liver look good. I will let him know the results of his urine when we get that. He should keep his appointment with Dr. Yong Channel as scheduled.  Eugena Rhue M. Katy Brickell, M.D.

## 2013-07-05 NOTE — Telephone Encounter (Signed)
Will inform when he comes to drop off urine. Bradley Meadows

## 2013-07-05 NOTE — Progress Notes (Signed)
Pt brought urine in for routine urinalysis.   Unk Lightning, MLS

## 2013-07-06 ENCOUNTER — Telehealth: Payer: Self-pay | Admitting: Family Medicine

## 2013-07-06 NOTE — Telephone Encounter (Signed)
Pt is aware of this. Bradley Meadows,CMA  

## 2013-07-06 NOTE — Telephone Encounter (Signed)
Will you please let Bradley Meadows know that his urine still has a little protein in it. I am still not overly concerned, but I will defer to Dr. Yong Channel for further work up. If he starts having any signs of urine infection, he should let us know.  Thank you, Amber M. Hairford, M.D.

## 2013-07-12 ENCOUNTER — Telehealth: Payer: Self-pay | Admitting: Family Medicine

## 2013-07-12 DIAGNOSIS — Z96659 Presence of unspecified artificial knee joint: Secondary | ICD-10-CM

## 2013-07-12 NOTE — Telephone Encounter (Signed)
Tried to call patient and was not able to reach him.  No VM available. Jazmin Hartsell,CMA

## 2013-07-12 NOTE — Telephone Encounter (Signed)
Needs referral to Dr Rayvon Char office This is the dr that did his knee replacement Dr Zachery Conch  [phone]

## 2013-07-12 NOTE — Telephone Encounter (Signed)
Done. Please inform patient of current wait time for referrals.

## 2013-07-21 ENCOUNTER — Ambulatory Visit (INDEPENDENT_AMBULATORY_CARE_PROVIDER_SITE_OTHER): Payer: Medicare Other | Admitting: Family Medicine

## 2013-07-21 ENCOUNTER — Encounter: Payer: Self-pay | Admitting: Family Medicine

## 2013-07-21 VITALS — BP 123/70 | HR 73 | Temp 98.1°F | Wt 213.0 lb

## 2013-07-21 DIAGNOSIS — I5022 Chronic systolic (congestive) heart failure: Secondary | ICD-10-CM | POA: Diagnosis not present

## 2013-07-21 DIAGNOSIS — F17200 Nicotine dependence, unspecified, uncomplicated: Secondary | ICD-10-CM

## 2013-07-21 DIAGNOSIS — F329 Major depressive disorder, single episode, unspecified: Secondary | ICD-10-CM | POA: Diagnosis not present

## 2013-07-21 DIAGNOSIS — F3289 Other specified depressive episodes: Secondary | ICD-10-CM | POA: Diagnosis not present

## 2013-07-21 DIAGNOSIS — Z720 Tobacco use: Secondary | ICD-10-CM

## 2013-07-21 DIAGNOSIS — F32A Depression, unspecified: Secondary | ICD-10-CM

## 2013-07-21 NOTE — Patient Instructions (Signed)
Congestive Heart Failure  Check echocardiogram  Continue lasix  Smoking  Get some nicotine gum, use this instead of using the 1-2 cigarettes per day  When you are no longer using cigarettes, cut back to 14 mg patch  See [place] back within 2-3 weeks to check in (or you can continue to cut back to 7 mg if not smoking)  [place] job working toward quitting!   Depression  Symptoms have resolved once your friend moved away  I am ok with you staying off the medicine  Thanks, Dr. Yong Channel

## 2013-07-24 NOTE — Assessment & Plan Note (Signed)
Down to 1-2 cigarettes per day. Discussed weaning down nicotine after complete cessation with aid of gum.

## 2013-07-24 NOTE — Assessment & Plan Note (Signed)
Improved control on lasix (still with 1+ edema). Will continue once daily at this time. Will also get repeat echocardiogram to assess EF as has been 5 years.

## 2013-07-24 NOTE — Assessment & Plan Note (Signed)
PHQ9 of 2 now off of any medicine. Seemed to have situational depression due to girlfriend that bothered him. He is ecstatic she moved away. He does not want to continue on Wellbutrin as he quit several weeks ago. Would consider phq9 at subsequent follow up visits to monitor and definitely if girlfriend returns.

## 2013-07-24 NOTE — Progress Notes (Signed)
Garret Reddish, MD Phone: [phone]  Subjective:   Bradley Meadows is a 73 y.o. year old very pleasant male patient who presents with the following:  CHF Patient recently started on daily lasix due to weight gain and edema. He has been taking lasix and weight has returned to 213 after peaking at 220. Patient states edema is some better but still worse as day progresses ROS- denies orthopnea/PND. DOes have some CHF (difficult to tease out cause as also has COPD and sob got worse when started back smoking)  Tobacco abuse Smoking 1-2 cigarettes a day on 21mg  nicotine patch. Wants to quit completely.  ROS- no chest pain. Mild SOB noted as above  Depression Denies symptoms and phq9 of 2 due to some intermittent sleep issues. He stopped taking Wellbutrin a few days after his girlfriend moved away. He states he feels much better and does not need medication ROS-no si/hi  Past Medical History- Patient Active Problem List   Diagnosis Date Noted  . Depression 06/22/2013    Priority: High  . Tobacco abuse 08/29/2010    Priority: High  . Chronic systolic heart failure 98/33/8250    Priority: High  . Mixed hyperlipidemia 03/26/2007    Priority: Medium  . CAD 09/04/2006    Priority: Medium  . COPD 04/21/2006    Priority: Medium  . HYPERTENSION, BENIGN SYSTEMIC 04/02/2006    Priority: Medium  . Anemia 06/03/2012    Priority: Low  . Inguinal hernia 01/14/2013  . Chronic hyponatremia 06/03/2012  . Dizziness 05/26/2012  . ERECTILE DYSFUNCTION 04/02/2006  . OSTEOARTHRITIS, MULTI SITES 04/02/2006   Medications- reviewed and updated Current Outpatient Prescriptions  Medication Sig Dispense Refill  . aspirin 81 MG tablet Take 1 tablet (81 mg total) by mouth daily.  1 tablet  3  . COMBIVENT RESPIMAT 20-100 MCG/ACT AERS respimat INHAEL 1 PUFF BY MOUTH EVERY 6 HOURS AS NEEDED FOR WHEEZING  4 g  5  . Diclofenac Sodium CR 100 MG 24 hr tablet Take 100 mg by mouth daily.      . ferrous sulfate  325 (65 FE) MG tablet TAKE 1 TABLET BY MOUTH DAILY WITH BREAKFAST  90 tablet  0  . furosemide (LASIX) 40 MG tablet TAKE 1 TABLET BY MOUTH ONCE DAILY FOR 5 DAYS THEN BE REEVALUATED  90 tablet  0  . lisinopril-hydrochlorothiazide (PRINZIDE,ZESTORETIC) 20-12.5 MG per tablet Take 1 tablet by mouth daily.  90 tablet  4  . metoprolol tartrate (LOPRESSOR) 25 MG tablet Take 1 tablet (25 mg total) by mouth 2 (two) times daily.  180 tablet  3  . nicotine (NICODERM CQ - DOSED IN MG/24 HOURS) 21 mg/24hr patch Place 1 patch (21 mg total) onto the skin daily.  28 patch  0  . nicotine polacrilex (NICORETTE) 2 MG gum Take 1 each (2 mg total) by mouth as needed for smoking cessation.  100 tablet  0  . omeprazole (PRILOSEC) 20 MG capsule TAKE ONE CAPSULE BY MOUTH DAILY  30 capsule  11  . sildenafil (VIAGRA) 25 MG tablet Take 1 tablet (25 mg total) by mouth daily as needed.  2 tablet  12  . simvastatin (ZOCOR) 20 MG tablet Take 1 tablet (20 mg total) by mouth at bedtime.  90 tablet  4   No current facility-administered medications for this visit.    Objective: BP 123/70  Pulse 73  Temp(Src) 98.1 F (36.7 C) (Oral)  Wt 213 lb (96.616 kg) Gen: NAD, resting comfortably in chair CV:  RRR no murmurs rubs or gallops, no jvd Lungs: CTAB no crackles, wheeze, rhonchi Abdomen: soft/nontender/nondistended/normal bowel sounds.  Ext: 1+ pitting edema (improved from 2+ on last visit) Skin: warm, dry, no rash  Assessment/Plan:  Depression PHQ9 of 2 now off of any medicine. Seemed to have situational depression due to girlfriend that bothered him. He is ecstatic she moved away. He does not want to continue on Wellbutrin as he quit several weeks ago. Would consider phq9 at subsequent follow up visits to monitor and definitely if girlfriend returns.    Tobacco abuse Down to 1-2 cigarettes per day. Discussed weaning down nicotine after complete cessation with aid of gum.   Chronic systolic heart failure Improved control  on lasix (still with 1+ edema). Will continue once daily at this time. Will also get repeat echocardiogram to assess EF as has been 5 years.    Orders Placed This Encounter  Procedures  . 2D Echocardiogram with contrast    Standing Status: Future     Number of Occurrences:      Standing Expiration Date: 07/22/2014    Order Specific Question:  Type of Echo    Answer:  Complete    Order Specific Question:  Where should this test be performed    Answer:  [facility])    Order Specific Question:  Reason for exam-Echo    Answer:  Congestive Heart Failure  428.0

## 2013-07-26 DIAGNOSIS — M75120 Complete rotator cuff tear or rupture of unspecified shoulder, not specified as traumatic: Secondary | ICD-10-CM | POA: Diagnosis not present

## 2013-08-01 ENCOUNTER — Ambulatory Visit (HOSPITAL_COMMUNITY)
Admission: RE | Admit: 2013-08-01 | Discharge: 2013-08-01 | Disposition: A | Discharge status: Home / Self Care | Payer: Medicare Other | Source: Ambulatory Visit | Attending: Obstetrics and Gynecology | Admitting: Obstetrics and Gynecology

## 2013-08-01 DIAGNOSIS — R0609 Other forms of dyspnea: Secondary | ICD-10-CM | POA: Insufficient documentation

## 2013-08-01 DIAGNOSIS — E785 Hyperlipidemia, unspecified: Secondary | ICD-10-CM | POA: Diagnosis not present

## 2013-08-01 DIAGNOSIS — I5022 Chronic systolic (congestive) heart failure: Secondary | ICD-10-CM

## 2013-08-01 DIAGNOSIS — R609 Edema, unspecified: Secondary | ICD-10-CM | POA: Diagnosis not present

## 2013-08-01 DIAGNOSIS — I517 Cardiomegaly: Secondary | ICD-10-CM | POA: Insufficient documentation

## 2013-08-01 DIAGNOSIS — I509 Heart failure, unspecified: Secondary | ICD-10-CM | POA: Insufficient documentation

## 2013-08-01 DIAGNOSIS — R002 Palpitations: Secondary | ICD-10-CM | POA: Diagnosis not present

## 2013-08-01 DIAGNOSIS — R0989 Other specified symptoms and signs involving the circulatory and respiratory systems: Secondary | ICD-10-CM | POA: Diagnosis not present

## 2013-08-01 NOTE — Progress Notes (Signed)
  Echocardiogram 2D Echocardiogram has been performed.  Bradley Meadows 08/01/2013, 3:49 PM

## 2013-08-02 ENCOUNTER — Telehealth: Payer: Self-pay | Admitting: *Deleted

## 2013-08-02 ENCOUNTER — Encounter: Payer: Self-pay | Admitting: Family Medicine

## 2013-08-02 NOTE — Telephone Encounter (Signed)
Patient called [facility] and I discussed stable results with him. He will continue lasix.

## 2013-08-02 NOTE — Telephone Encounter (Signed)
Message copied by Bobbye Riggs on Tue Aug 02, 2013  2:28 PM ------      Message from: Marin Olp      Created: Tue Aug 02, 2013 12:30 PM       LVM for patient to return call      Blue team-please tell patient his echocardiogram showed his pumping function is just as strong as it was 5 years ago with an ejection fraction of 45-50% (normal would be 55 or 60). He can continue his lasix. ------

## 2013-08-13 ENCOUNTER — Other Ambulatory Visit: Payer: Self-pay | Admitting: Family Medicine

## 2013-08-15 ENCOUNTER — Other Ambulatory Visit: Payer: Self-pay | Admitting: Family Medicine

## 2013-08-21 ENCOUNTER — Other Ambulatory Visit: Payer: Self-pay | Admitting: Family Medicine

## 2013-09-16 ENCOUNTER — Other Ambulatory Visit: Payer: Self-pay | Admitting: Family Medicine

## 2013-09-19 ENCOUNTER — Other Ambulatory Visit: Payer: Self-pay | Admitting: *Deleted

## 2013-09-19 DIAGNOSIS — I10 Essential (primary) hypertension: Secondary | ICD-10-CM

## 2013-10-01 ENCOUNTER — Other Ambulatory Visit: Payer: Self-pay | Admitting: Family Medicine

## 2013-10-24 ENCOUNTER — Other Ambulatory Visit: Payer: Self-pay | Admitting: *Deleted

## 2013-10-24 MED ORDER — IPRATROPIUM-ALBUTEROL 20-100 MCG/ACT IN AERS: INHALATION_SPRAY | RESPIRATORY_TRACT | Status: DC

## 2013-12-15 DIAGNOSIS — H25013 Cortical age-related cataract, bilateral: Secondary | ICD-10-CM | POA: Diagnosis not present

## 2013-12-15 DIAGNOSIS — H25043 Posterior subcapsular polar age-related cataract, bilateral: Secondary | ICD-10-CM | POA: Diagnosis not present

## 2013-12-15 DIAGNOSIS — H2513 Age-related nuclear cataract, bilateral: Secondary | ICD-10-CM | POA: Diagnosis not present

## 2013-12-18 ENCOUNTER — Other Ambulatory Visit: Payer: Self-pay | Admitting: Family Medicine

## 2013-12-18 DIAGNOSIS — K219 Gastro-esophageal reflux disease without esophagitis: Secondary | ICD-10-CM

## 2013-12-18 DIAGNOSIS — I5022 Chronic systolic (congestive) heart failure: Secondary | ICD-10-CM

## 2013-12-18 DIAGNOSIS — I10 Essential (primary) hypertension: Secondary | ICD-10-CM

## 2013-12-21 ENCOUNTER — Other Ambulatory Visit: Payer: Self-pay | Admitting: *Deleted

## 2013-12-21 DIAGNOSIS — I10 Essential (primary) hypertension: Secondary | ICD-10-CM

## 2013-12-21 MED ORDER — METOPROLOL TARTRATE 25 MG PO TABS: 25.0000 mg | ORAL_TABLET | Freq: Two times a day (BID) | ORAL | Status: DC

## 2013-12-21 MED ORDER — OMEPRAZOLE 20 MG PO CPDR: 20.0000 mg | DELAYED_RELEASE_CAPSULE | Freq: Every day | ORAL | Status: DC

## 2014-01-04 ENCOUNTER — Other Ambulatory Visit: Payer: Self-pay | Admitting: Family Medicine

## 2014-01-09 ENCOUNTER — Other Ambulatory Visit: Payer: Self-pay | Admitting: *Deleted

## 2014-01-10 MED ORDER — FUROSEMIDE 40 MG PO TABS: ORAL_TABLET | ORAL | Status: DC

## 2014-02-07 DIAGNOSIS — M75121 Complete rotator cuff tear or rupture of right shoulder, not specified as traumatic: Secondary | ICD-10-CM | POA: Diagnosis not present

## 2014-02-21 DIAGNOSIS — H02839 Dermatochalasis of unspecified eye, unspecified eyelid: Secondary | ICD-10-CM | POA: Diagnosis not present

## 2014-02-21 DIAGNOSIS — H2511 Age-related nuclear cataract, right eye: Secondary | ICD-10-CM | POA: Diagnosis not present

## 2014-02-21 DIAGNOSIS — H18411 Arcus senilis, right eye: Secondary | ICD-10-CM | POA: Diagnosis not present

## 2014-02-21 DIAGNOSIS — H3531 Nonexudative age-related macular degeneration: Secondary | ICD-10-CM | POA: Diagnosis not present

## 2014-02-27 DIAGNOSIS — Z23 Encounter for immunization: Secondary | ICD-10-CM | POA: Diagnosis not present

## 2014-04-03 DIAGNOSIS — H25811 Combined forms of age-related cataract, right eye: Secondary | ICD-10-CM | POA: Diagnosis not present

## 2014-04-03 DIAGNOSIS — H25013 Cortical age-related cataract, bilateral: Secondary | ICD-10-CM | POA: Diagnosis not present

## 2014-04-03 DIAGNOSIS — H2513 Age-related nuclear cataract, bilateral: Secondary | ICD-10-CM | POA: Diagnosis not present

## 2014-04-03 DIAGNOSIS — H2511 Age-related nuclear cataract, right eye: Secondary | ICD-10-CM | POA: Diagnosis not present

## 2014-04-04 DIAGNOSIS — H2512 Age-related nuclear cataract, left eye: Secondary | ICD-10-CM | POA: Diagnosis not present

## 2014-04-10 ENCOUNTER — Other Ambulatory Visit: Payer: Self-pay | Admitting: Family Medicine

## 2014-04-10 DIAGNOSIS — I5022 Chronic systolic (congestive) heart failure: Secondary | ICD-10-CM

## 2014-04-11 NOTE — Telephone Encounter (Signed)
Letter mailed to inform patient of his need to make a follow up appt. Jazmin Hartsell,CMA

## 2014-04-12 NOTE — Telephone Encounter (Signed)
Please refill-has been assigned to [place]

## 2014-04-13 ENCOUNTER — Encounter: Payer: Self-pay | Admitting: Family Medicine

## 2014-04-13 ENCOUNTER — Ambulatory Visit (INDEPENDENT_AMBULATORY_CARE_PROVIDER_SITE_OTHER): Payer: Medicare Other | Admitting: Family Medicine

## 2014-04-13 VITALS — BP 138/88 | HR 75 | Temp 98.2°F | Ht 73.0 in | Wt 218.0 lb

## 2014-04-13 DIAGNOSIS — Z720 Tobacco use: Secondary | ICD-10-CM | POA: Diagnosis not present

## 2014-04-13 DIAGNOSIS — K4090 Unilateral inguinal hernia, without obstruction or gangrene, not specified as recurrent: Secondary | ICD-10-CM

## 2014-04-13 DIAGNOSIS — E782 Mixed hyperlipidemia: Secondary | ICD-10-CM | POA: Diagnosis not present

## 2014-04-13 DIAGNOSIS — Z8711 Personal history of peptic ulcer disease: Secondary | ICD-10-CM | POA: Diagnosis not present

## 2014-04-13 DIAGNOSIS — J42 Unspecified chronic bronchitis: Secondary | ICD-10-CM

## 2014-04-13 DIAGNOSIS — I10 Essential (primary) hypertension: Secondary | ICD-10-CM | POA: Diagnosis not present

## 2014-04-13 DIAGNOSIS — I5022 Chronic systolic (congestive) heart failure: Secondary | ICD-10-CM | POA: Diagnosis not present

## 2014-04-13 MED ORDER — LISINOPRIL-HYDROCHLOROTHIAZIDE 20-12.5 MG PO TABS: 1.0000 | ORAL_TABLET | Freq: Every day | ORAL | Status: DC

## 2014-04-13 MED ORDER — TRIAMCINOLONE ACETONIDE 0.5 % EX CREA
1.0000 | TOPICAL_CREAM | Freq: Every day | CUTANEOUS | Status: DC
Start: 2014-04-13 — End: 2014-10-05

## 2014-04-13 MED ORDER — METOPROLOL TARTRATE 25 MG PO TABS
25.0000 mg | ORAL_TABLET | Freq: Two times a day (BID) | ORAL | Status: DC
Start: 2014-04-13 — End: 2015-06-29

## 2014-04-13 MED ORDER — MOMETASONE FURO-FORMOTEROL FUM 200-5 MCG/ACT IN AERO: 2.0000 | INHALATION_SPRAY | Freq: Two times a day (BID) | RESPIRATORY_TRACT | Status: DC

## 2014-04-13 MED ORDER — FUROSEMIDE 40 MG PO TABS: ORAL_TABLET | ORAL | Status: DC

## 2014-04-13 MED ORDER — ATORVASTATIN CALCIUM 40 MG PO TABS
40.0000 mg | ORAL_TABLET | Freq: Every day | ORAL | Status: DC
Start: 2014-04-13 — End: 2015-07-13

## 2014-04-13 MED ORDER — IPRATROPIUM-ALBUTEROL 20-100 MCG/ACT IN AERS: INHALATION_SPRAY | RESPIRATORY_TRACT | Status: DC

## 2014-04-13 MED ORDER — OMEPRAZOLE 20 MG PO CPDR: 20.0000 mg | DELAYED_RELEASE_CAPSULE | Freq: Every day | ORAL | Status: DC

## 2014-04-13 MED ORDER — SPACER/AERO-HOLDING CHAMBERS DEVI: 1.0000 | Status: DC | PRN

## 2014-04-13 NOTE — Assessment & Plan Note (Signed)
Continue PPI.  Currently symptomatic.  Using NSAIDs occasionally for shoulder pain- he will discuss this with his orthopedist

## 2014-04-13 NOTE — Assessment & Plan Note (Signed)
Well controlled. Continue current medications  

## 2014-04-13 NOTE — Patient Instructions (Signed)
It was great seeing you today.   1. For your breathing (COPD) 1. Start Dulera - 2 puffs twice a day every day. Wash your mouth out with water after each use 2. Use spacer with combivent. Up to every 6 hours as needed 2. For your Cholesterol  1. Stop Zocor 2. Start Lipitor 40mg  - take once daily at night 3. For you hernia - I referred you to surgery.    Please bring all your medications to every doctors visit  Sign up for My Chart to have easy access to your labs results, and communication with your Primary care physician.  Next Appointment  Please make an appointment with Dr Berkley Harvey in 2 months   I look forward to talking with you again at our next visit. If you have any questions or concerns before then, please call the clinic at [phone].  Take Care,   Dr Phill Myron

## 2014-04-13 NOTE — Progress Notes (Signed)
  Patient name: Bradley Meadows MRN [id number]  Date of birth: June 02, 1940  CC & HPI:  Bradley Meadows is a 74 y.o. male presenting today for f/u HTN, COPD, HLD, and hernia.   CHRONIC HYPERTENSION  BP Readings from Last 3 Encounters:  04/13/14 138/88  07/21/13 123/70  07/04/13 130/79    Disease Monitoring  Chest pain: no   Dyspnea: no   Claudication: no  Medication compliance: yes  Medication Side Effects: denies Dizziness/lightheadedness;    Preventitive Healthcare:   History  Smoking status  . Current Every Day Smoker -- 1.00 packs/day  Smokeless tobacco  . 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  Has been gradually getting worse and now he noticed fullness in his scrotum.  He denies any pain associated with it.  Continues to have vomitus in past cast without difficulty.  No blood in stool.   ROS: See HPI   Medical & Surgical Hx:  Reviewed  Medications & Allergies: Reviewed  Social History: Reviewed:   Objective Findings:  Vitals: BP 138/88 mmHg  Pulse 75  Temp(Src) 98.2 F (36.8 C) (Oral)  Ht 6\' 1"  (1.854 m)  Wt 218 lb (98.884 kg)  BMI 28.77 kg/m2  Gen: NAD CV: RRR w/o m/r/g, pulses +2 b/l Resp: CTAB w/ normal respiratory effort GU: Enlarged scrotum; left inguinal hernia; BS + in scrotum Lower Ext: No edema; Calves nontender  Assessment & Plan:   Please See Problem Focused Assessment & Plan

## 2014-04-13 NOTE — Assessment & Plan Note (Signed)
Poorly controlled.  Using Combivent 4 times daily w/o spacer - Start Dulera 2 puffs twice a day - Continue Combivent; spacer ordered - Discussed getting PFTs with Dr Valentina Lucks - he will defer this at this time until he has completed his cataract surgeries - Current smoker working on quitting

## 2014-04-13 NOTE — Assessment & Plan Note (Signed)
Enlarging inguinal hernia current signs of signs strangulation - Refer to surgery.  He will defer this until cataract surgery completed

## 2014-04-17 DIAGNOSIS — H2512 Age-related nuclear cataract, left eye: Secondary | ICD-10-CM | POA: Diagnosis not present

## 2014-04-17 DIAGNOSIS — H25812 Combined forms of age-related cataract, left eye: Secondary | ICD-10-CM | POA: Diagnosis not present

## 2014-04-17 DIAGNOSIS — H2513 Age-related nuclear cataract, bilateral: Secondary | ICD-10-CM | POA: Diagnosis not present

## 2014-04-24 ENCOUNTER — Other Ambulatory Visit: Payer: Self-pay | Admitting: Family Medicine

## 2014-04-24 NOTE — Telephone Encounter (Signed)
Refill request. Will forward to PCP for review. Ceanna Wareing, CMA.

## 2014-06-06 ENCOUNTER — Other Ambulatory Visit: Payer: Self-pay | Admitting: Family Medicine

## 2014-07-09 ENCOUNTER — Other Ambulatory Visit: Payer: Self-pay | Admitting: Family Medicine

## 2014-07-18 DIAGNOSIS — M75121 Complete rotator cuff tear or rupture of right shoulder, not specified as traumatic: Secondary | ICD-10-CM | POA: Diagnosis not present

## 2014-09-29 ENCOUNTER — Encounter: Payer: Self-pay | Admitting: Family Medicine

## 2014-10-05 ENCOUNTER — Ambulatory Visit (INDEPENDENT_AMBULATORY_CARE_PROVIDER_SITE_OTHER): Payer: Medicare Other | Admitting: Family Medicine

## 2014-10-05 ENCOUNTER — Encounter: Payer: Self-pay | Admitting: Family Medicine

## 2014-10-05 VITALS — BP 110/60 | HR 60 | Temp 98.9°F | Resp 16 | Ht 73.0 in | Wt 225.0 lb

## 2014-10-05 DIAGNOSIS — J42 Unspecified chronic bronchitis: Secondary | ICD-10-CM | POA: Diagnosis not present

## 2014-10-05 DIAGNOSIS — Z720 Tobacco use: Secondary | ICD-10-CM

## 2014-10-05 DIAGNOSIS — I10 Essential (primary) hypertension: Secondary | ICD-10-CM | POA: Diagnosis not present

## 2014-10-05 DIAGNOSIS — I2510 Atherosclerotic heart disease of native coronary artery without angina pectoris: Secondary | ICD-10-CM

## 2014-10-05 DIAGNOSIS — Z23 Encounter for immunization: Secondary | ICD-10-CM | POA: Diagnosis not present

## 2014-10-05 DIAGNOSIS — R5383 Other fatigue: Secondary | ICD-10-CM | POA: Diagnosis not present

## 2014-10-05 DIAGNOSIS — I5022 Chronic systolic (congestive) heart failure: Secondary | ICD-10-CM | POA: Diagnosis not present

## 2014-10-05 DIAGNOSIS — E782 Mixed hyperlipidemia: Secondary | ICD-10-CM

## 2014-10-05 MED ORDER — NICOTINE 21 MG/24HR TD PT24: 21.0000 mg | MEDICATED_PATCH | Freq: Every day | TRANSDERMAL | Status: DC

## 2014-10-05 NOTE — Progress Notes (Signed)
Subjective:    Patient ID: Bradley Meadows, male    DOB: 1940/04/23, 74 y.o.   MRN: [id number]  HPI Patient is a 74 year old white male who is here today to establish care. Past medical history is significant for coronary artery disease. Patient states he had a heart attack in the remote past. He denies any stents. He is currently taking an aspirin 81 mg by mouth daily. He also has a long-standing history of COPD. He continues to smoke. He is currently using Combivent 4 times a day. He reports dyspnea on exertion as well as significant fatigue.[name] He has no energy. He also has a history of hypertension as well as hyperlipidemia. He is overdue for fasting lipid panel. Given his history of coronary artery disease his goal LDL cholesterol is less than 70. His blood pressures well controlled today 110/60. He is overdue for colonoscopy area patient states he has never had one. He is also due for an annual prostate exam.. Patient has had Pneumovax 23. He has had Zostavax. He has had a tetanus shot. He is due for a flu shot today as well as Prevnar 13. Past Medical History  Diagnosis Date  . Hyperlipidemia   . Hypertension   . COPD (chronic obstructive pulmonary disease)   . GERD (gastroesophageal reflux disease)     ulcers  . CHF (congestive heart failure)     ef=45-50%   Past Surgical History  Procedure Laterality Date  . Joint replacement      knee both  . Eye surgery      cataracts   Current Outpatient Prescriptions on File Prior to Visit  Medication Sig Dispense Refill  . albuterol-ipratropium (COMBIVENT) 18-103 MCG/ACT inhaler Inhale 2 puffs into the lungs every 6 (six) hours as needed for wheezing or shortness of breath.    [name] aspirin 81 MG tablet Take 1 tablet (81 mg total) by mouth daily. 1 tablet 3  . atorvastatin (LIPITOR) 40 MG tablet Take 1 tablet (40 mg total) by mouth daily. 90 tablet 3  . ferrous sulfate 325 (65 FE) MG tablet TAKE 1 TABLET BY MOUTH EVERY DAY WITH BREAKFAST 90 tablet 3   . furosemide (LASIX) 40 MG tablet TAKE 1 TABLET BY MOUTH ONCE DAILY FOR 5 DAYS, THEN BE REEVALUATED 90 tablet 0  . lisinopril-hydrochlorothiazide (PRINZIDE,ZESTORETIC) 20-12.5 MG per tablet TAKE 1 TABLET BY MOUTH DAILY 90 tablet 1  . metoprolol tartrate (LOPRESSOR) 25 MG tablet Take 1 tablet (25 mg total) by mouth 2 (two) times daily. 180 tablet 3  . Multiple Vitamin (MULTIVITAMIN) tablet Take 1 tablet by mouth daily. Centrum Silver    . omeprazole (PRILOSEC) 20 MG capsule Take 1 capsule (20 mg total) by mouth daily. 90 capsule 0  . Spacer/Aero-Holding Chambers DEVI 1 Device by Does not apply route as needed. 1 each 0   No current facility-administered medications on file prior to visit.   No Known Allergies Social History   Social History  . Marital Status: Divorced    Spouse Name: N/A  . Number of Children: 3  . Years of Education: 9   Occupational History  . RetiredAnimal nutritionist    Social History Main Topics  . Smoking status: Former Smoker -- 1.00 packs/day    Types: Cigarettes  . Smokeless tobacco: Never Used  . Alcohol Use: No  . Drug Use: No  . Sexual Activity: Not Currently   Other Topics Concern  . Not on file   Social History Narrative  Grew up in [place] area.    Left school after 9th grade. No father and had to work.    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       Health Care POA:    Emergency Contact: sister, Hector Brunswick (h) [phone]   End of Life Plan:    Who lives with you: self   Any pets: Lorrin Goodell   Diet: Pt limits sugars and starches and focuses on vegetables an protein. Pt has lost 50 lbs over several years.   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   Seatbelts: Pt reports wearing seatbelt when in vehicle.   Sun Exposure/Protection: Pt does not use sun protectin.   Hobbies: Racing, TV, gardening, Architect            Review of Systems  All other systems  reviewed and are negative.      Objective:   Physical Exam  Constitutional: He is oriented to person, place, and time. He appears well-developed and well-nourished. No distress.  Neck: Neck supple. No JVD present. No thyromegaly present.  Cardiovascular: Normal rate, regular rhythm, normal heart sounds and intact distal pulses.   No murmur heard. Pulmonary/Chest: Effort normal. No respiratory distress. He has wheezes. He has no rales.  Abdominal: Soft. Bowel sounds are normal. He exhibits no distension. There is no tenderness. There is no rebound and no guarding.  Musculoskeletal: He exhibits edema.  Lymphadenopathy:    He has no cervical adenopathy.  Neurological: He is alert and oriented to person, place, and time. He has normal reflexes. He displays normal reflexes. No cranial nerve deficit. He exhibits normal muscle tone. Coordination normal.  Skin: He is not diaphoretic.  Vitals reviewed.         Assessment & Plan:  Need for prophylactic vaccination and inoculation against influenza - Plan: Flu Vaccine QUAD 36+ mos IM  Need for prophylactic vaccination against Streptococcus pneumoniae (pneumococcus) - Plan: Pneumococcal conjugate vaccine 13-valent IM  HYPERTENSION, BENIGN SYSTEMIC  Chronic bronchitis, unspecified chronic bronchitis type  Mixed hyperlipidemia  Chronic systolic heart failure  Tobacco abuse  ASCVD (arteriosclerotic cardiovascular disease)  Patient received a flu shot today as well as Prevnar 42. Patient defers the colonoscopy at the present time. He would like to do the prostate exam at his next visit. His blood pressures well controlled today. Even his history of coronary artery disease, his goal LDL cholesterol is less than 70. He is currently taking an ACE inhibitor, beta blocker, as well as a 81 mg aspirin on a daily basis. I encouraged smoking cessation and to that end, the patient asked me to send a prescription for NicoDerm patches to his pharmacy. I  gladly did that. He has a history of ingested heart failure with an ejection fraction of 45-50%. He is on a beta blocker as well as an ACE inhibitor. He uses Lasix for swelling. His ejection fraction is not low enough yet to warrant spironolactone. Again I encouraged smoking cessation and sodium restriction. I would like the patient to return fasting tomorrow morning for a CBC, CMP, fasting lipid panel, PSA. I will try to optimize his management of COPD to see if this would improve his dyspnea. I recommended he use Combivent only as needed and begin stiolto 2 inh qday.  Recheck in 2 weeks

## 2014-10-06 ENCOUNTER — Other Ambulatory Visit: Payer: Medicare Other

## 2014-10-06 DIAGNOSIS — R5383 Other fatigue: Secondary | ICD-10-CM | POA: Diagnosis not present

## 2014-10-06 DIAGNOSIS — I10 Essential (primary) hypertension: Secondary | ICD-10-CM | POA: Diagnosis not present

## 2014-10-06 DIAGNOSIS — I5022 Chronic systolic (congestive) heart failure: Secondary | ICD-10-CM | POA: Diagnosis not present

## 2014-10-06 DIAGNOSIS — I2510 Atherosclerotic heart disease of native coronary artery without angina pectoris: Secondary | ICD-10-CM | POA: Diagnosis not present

## 2014-10-06 DIAGNOSIS — D649 Anemia, unspecified: Secondary | ICD-10-CM | POA: Diagnosis not present

## 2014-10-06 LAB — COMPLETE METABOLIC PANEL WITH GFR
ALBUMIN: 4 g/dL (ref 3.6–5.1)
ALK PHOS: 65 U/L (ref 40–115)
ALT: 17 U/L (ref 9–46)
AST: 19 U/L (ref 10–35)
BUN: 14 mg/dL (ref 7–25)
CALCIUM: 9.4 mg/dL (ref 8.6–10.3)
CHLORIDE: 90 mmol/L — AB (ref 98–110)
CO2: 30 mmol/L (ref 20–31)
Creat: 0.86 mg/dL (ref 0.70–1.18)
GFR, EST NON AFRICAN AMERICAN: 85 mL/min (ref >=60)
Glucose, Bld: 96 mg/dL (ref 70–99)
POTASSIUM: 4.6 mmol/L (ref 3.5–5.3)
Sodium: 133 mmol/L — ABNORMAL LOW (ref 135–146)
Total Bilirubin: 0.8 mg/dL (ref 0.2–1.2)
Total Protein: 6.2 g/dL (ref 6.1–8.1)

## 2014-10-06 LAB — CBC WITH DIFFERENTIAL/PLATELET
Basophils Absolute: 0 10*3/uL (ref 0.0–0.1)
Basophils Relative: 0 % (ref 0–1)
EOS PCT: 4 % (ref 0–5)
Eosinophils Absolute: 0.3 10*3/uL (ref 0.0–0.7)
HEMATOCRIT: 40.1 % (ref 39.0–52.0)
HEMOGLOBIN: 14.2 g/dL (ref 13.0–17.0)
LYMPHS PCT: 20 % (ref 12–46)
Lymphs Abs: 1.7 10*3/uL (ref 0.7–4.0)
MCH: 31.8 pg (ref 26.0–34.0)
MCHC: 35.4 g/dL (ref 30.0–36.0)
MCV: 89.7 fL (ref 78.0–100.0)
MONO ABS: 1 10*3/uL (ref 0.1–1.0)
MONOS PCT: 12 % (ref 3–12)
MPV: 10.7 fL (ref 8.6–12.4)
NEUTROS ABS: 5.4 10*3/uL (ref 1.7–7.7)
Neutrophils Relative %: 64 % (ref 43–77)
Platelets: 203 10*3/uL (ref 150–400)
RBC: 4.47 MIL/uL (ref 4.22–5.81)
RDW: 14.2 % (ref 11.5–15.5)
WBC: 8.5 10*3/uL (ref 4.0–10.5)

## 2014-10-06 LAB — LIPID PANEL
CHOL/HDL RATIO: 3.2 ratio (ref <=5.0)
CHOLESTEROL: 126 mg/dL (ref 125–200)
HDL: 39 mg/dL — AB (ref >=40)
LDL Cholesterol: 69 mg/dL (ref <130)
TRIGLYCERIDES: 89 mg/dL (ref <150)
VLDL: 18 mg/dL (ref <30)

## 2014-10-06 LAB — VITAMIN B12: VITAMIN B 12: 627 pg/mL (ref 211–911)

## 2014-10-06 LAB — TSH: TSH: 1.302 u[IU]/mL (ref 0.350–4.500)

## 2014-10-09 ENCOUNTER — Other Ambulatory Visit: Payer: Self-pay | Admitting: Family Medicine

## 2014-10-10 ENCOUNTER — Encounter: Payer: Self-pay | Admitting: Family Medicine

## 2014-10-19 ENCOUNTER — Encounter: Payer: Self-pay | Admitting: Family Medicine

## 2014-10-19 ENCOUNTER — Ambulatory Visit (INDEPENDENT_AMBULATORY_CARE_PROVIDER_SITE_OTHER): Payer: Medicare Other | Admitting: Family Medicine

## 2014-10-19 VITALS — BP 98/54 | HR 62 | Temp 98.8°F | Resp 20 | Wt 227.0 lb

## 2014-10-19 DIAGNOSIS — R609 Edema, unspecified: Secondary | ICD-10-CM

## 2014-10-19 DIAGNOSIS — I2510 Atherosclerotic heart disease of native coronary artery without angina pectoris: Secondary | ICD-10-CM | POA: Diagnosis not present

## 2014-10-19 DIAGNOSIS — J449 Chronic obstructive pulmonary disease, unspecified: Secondary | ICD-10-CM

## 2014-10-19 DIAGNOSIS — I5022 Chronic systolic (congestive) heart failure: Secondary | ICD-10-CM | POA: Diagnosis not present

## 2014-10-19 DIAGNOSIS — H6123 Impacted cerumen, bilateral: Secondary | ICD-10-CM

## 2014-10-19 MED ORDER — TIOTROPIUM BROMIDE-OLODATEROL 2.5-2.5 MCG/ACT IN AERS: 2.0000 | INHALATION_SPRAY | Freq: Every day | RESPIRATORY_TRACT | Status: DC

## 2014-10-19 MED ORDER — FUROSEMIDE 40 MG PO TABS: ORAL_TABLET | ORAL | Status: DC

## 2014-10-19 NOTE — Progress Notes (Signed)
Subjective:    Patient ID: Bradley Meadows, male    DOB: 1940-05-23, 74 y.o.   MRN: [id number]  HPI 10/05/14 Patient is a 74 year old white male who is here today to establish care. Past medical history is significant for coronary artery disease. Patient states he had a heart attack in the remote past. He denies any stents. He is currently taking an aspirin 81 mg by mouth daily. He also has a long-standing history of COPD. He continues to smoke. He is currently using Combivent 4 times a day. He reports dyspnea on exertion as well as significant fatigue.[name] He has no energy. He also has a history of hypertension as well as hyperlipidemia. He is overdue for fasting lipid panel. Given his history of coronary artery disease his goal LDL cholesterol is less than 70. His blood pressures well controlled today 110/60. He is overdue for colonoscopy as the patient states he has never had one. He is also due for an annual prostate exam.. Patient has had Pneumovax 23. He has had Zostavax. He has had a tetanus shot. He is due for a flu shot today as well as Prevnar 13.    At that time, my plan was: Patient received a flu shot today as well as Prevnar 13. Patient defers the colonoscopy at the present time. He would like to do the prostate exam at his next visit. His blood pressure is well controlled today. Given his history of coronary artery disease, his goal LDL cholesterol is less than 70. He is currently taking an ACE inhibitor, beta blocker, as well as a 81 mg aspirin on a daily basis. I encouraged smoking cessation and to that end, the patient asked me to send a prescription for NicoDerm patches to his pharmacy. I gladly did that. He has a history of congestive heart failure with an ejection fraction of 45-50%. He is on a beta blocker as well as an ACE inhibitor. He uses Lasix for swelling. His ejection fraction is not low enough yet to warrant spironolactone. Again I encouraged smoking cessation and sodium restriction.  I would like the patient to return fasting tomorrow morning for a CBC, CMP, fasting lipid panel, PSA. I will try to optimize his management of COPD to see if this would improve his dyspnea. I recommended he use Combivent only as needed and begin stiolto 2 inh qday.  Recheck in 2 weeks  10/19/14 Patient's lab work was excellent. His breathing improved significantly on stiolto.  Furthermore he has decreased his smoking to 2 or 3 cigarettes per day. I congratulated the patient on his efforts and encouraged him to continue to work towards quitting smoking. He is only using Combivent at night. His immunizations are up-to-date. He was unable to tolerate compression stockings. He would like to continue Lasix 40 mg a day as needed for leg swelling. He also complains of cerumen impaction in both years. Sure enough on examination he has bilateral cerumen impactions Office Visit on 10/05/2014  Component Date Value Ref Range Status  . WBC 10/06/2014 8.5  4.0 - 10.5 K/uL Final  . RBC 10/06/2014 4.47  4.22 - 5.81 MIL/uL Final  . Hemoglobin 10/06/2014 14.2  13.0 - 17.0 g/dL Final  . HCT 10/06/2014 40.1  39.0 - 52.0 % Final  . MCV 10/06/2014 89.7  78.0 - 100.0 fL Final  . MCH 10/06/2014 31.8  26.0 - 34.0 pg Final  . MCHC 10/06/2014 35.4  30.0 - 36.0 g/dL Final  . RDW 10/06/2014 14.2  11.5 - 15.5 % Final  . Platelets 10/06/2014 203  150 - 400 K/uL Final  . MPV 10/06/2014 10.7  8.6 - 12.4 fL Final  . Neutrophils Relative % 10/06/2014 64  43 - 77 % Final  . Neutro Abs 10/06/2014 5.4  1.7 - 7.7 K/uL Final  . Lymphocytes Relative 10/06/2014 20  12 - 46 % Final  . Lymphs Abs 10/06/2014 1.7  0.7 - 4.0 K/uL Final  . Monocytes Relative 10/06/2014 12  3 - 12 % Final  . Monocytes Absolute 10/06/2014 1.0  0.1 - 1.0 K/uL Final  . Eosinophils Relative 10/06/2014 4  0 - 5 % Final  . Eosinophils Absolute 10/06/2014 0.3  0.0 - 0.7 K/uL Final  . Basophils Relative 10/06/2014 0  0 - 1 % Final  . Basophils Absolute 10/06/2014  0.0  0.0 - 0.1 K/uL Final  . Smear Review 10/06/2014 Criteria for review not met   Final  . Sodium 10/06/2014 133* 135 - 146 mmol/L Final  . Potassium 10/06/2014 4.6  3.5 - 5.3 mmol/L Final  . Chloride 10/06/2014 90* 98 - 110 mmol/L Final  . CO2 10/06/2014 30  20 - 31 mmol/L Final  . Glucose, Bld 10/06/2014 96  70 - 99 mg/dL Final  . BUN 10/06/2014 14  7 - 25 mg/dL Final  . Creat 10/06/2014 0.86  0.70 - 1.18 mg/dL Final  . Total Bilirubin 10/06/2014 0.8  0.2 - 1.2 mg/dL Final  . Alkaline Phosphatase 10/06/2014 65  40 - 115 U/L Final  . AST 10/06/2014 19  10 - 35 U/L Final  . ALT 10/06/2014 17  9 - 46 U/L Final  . Total Protein 10/06/2014 6.2  6.1 - 8.1 g/dL Final  . Albumin 10/06/2014 4.0  3.6 - 5.1 g/dL Final  . Calcium 10/06/2014 9.4  8.6 - 10.3 mg/dL Final  . GFR, Est African American 10/06/2014 >89  >=60 mL/min Final  . GFR, Est Non African American 10/06/2014 85  >=60 mL/min Final   Comment:   The estimated GFR is a calculation valid for adults (>=37 years old) that uses the CKD-EPI algorithm to adjust for age and sex. It is   not to be used for children, pregnant women, hospitalized patients,    patients on dialysis, or with rapidly changing kidney function. According to the NKDEP, eGFR >89 is normal, 60-89 shows mild impairment, 30-59 shows moderate impairment, 15-29 shows severe impairment and <15 is ESRD.     [name] Cholesterol 10/06/2014 126  125 - 200 mg/dL Final  . Triglycerides 10/06/2014 89  <150 mg/dL Final  . HDL 10/06/2014 39* >=40 mg/dL Final  . Total CHOL/HDL Ratio 10/06/2014 3.2  <=5.0 Ratio Final  . VLDL 10/06/2014 18  <30 mg/dL Final  . LDL Cholesterol 10/06/2014 69  <130 mg/dL Final   Comment:   Total Cholesterol/HDL Ratio:CHD Risk                        Coronary Heart Disease Risk Table                                        Men       Women          1/2 Average Risk              3.4        3.3  Average Risk              5.0        4.4            2X Average Risk              9.6        7.1           3X Average Risk             23.4       11.0 Use the calculated Patient Ratio above and the CHD Risk table  to determine the patient's CHD Risk.   [name] TSH 10/06/2014 1.302  0.350 - 4.500 uIU/mL Final  . Vitamin B-12 10/06/2014 627  211 - 911 pg/mL Final   Comment:   Footnotes:  (1) ** Please note change in unit of measure and reference range(s). **       Past Medical History  Diagnosis Date  . Hyperlipidemia   . Hypertension   . COPD (chronic obstructive pulmonary disease)   . GERD (gastroesophageal reflux disease)     ulcers  . CHF (congestive heart failure)     ef=45-50%   Past Surgical History  Procedure Laterality Date  . Joint replacement      knee both  . Eye surgery      cataracts   Current Outpatient Prescriptions on File Prior to Visit  Medication Sig Dispense Refill  . albuterol-ipratropium (COMBIVENT) 18-103 MCG/ACT inhaler Inhale 2 puffs into the lungs every 6 (six) hours as needed for wheezing or shortness of breath.    [name] aspirin 81 MG tablet Take 1 tablet (81 mg total) by mouth daily. 1 tablet 3  . atorvastatin (LIPITOR) 40 MG tablet Take 1 tablet (40 mg total) by mouth daily. 90 tablet 3  . ferrous sulfate 325 (65 FE) MG tablet TAKE 1 TABLET BY MOUTH EVERY DAY WITH BREAKFAST 90 tablet 3  . furosemide (LASIX) 40 MG tablet TAKE 1 TABLET BY MOUTH ONCE DAILY FOR 5 DAYS, THEN BE REEVALUATED 90 tablet 0  . HYDROcodone-acetaminophen (NORCO/VICODIN) 5-325 MG per tablet TK 1 T PO  BID. MUST LAST 30 DAYS  0  . lisinopril-hydrochlorothiazide (PRINZIDE,ZESTORETIC) 20-12.5 MG per tablet TAKE 1 TABLET BY MOUTH DAILY 90 tablet 1  . metoprolol tartrate (LOPRESSOR) 25 MG tablet Take 1 tablet (25 mg total) by mouth 2 (two) times daily. 180 tablet 3  . Multiple Vitamin (MULTIVITAMIN) tablet Take 1 tablet by mouth daily. Centrum Silver    . nicotine (NICODERM CQ) 21 mg/24hr patch Place 1 patch (21 mg total) onto the skin daily.  28 patch 3  . omeprazole (PRILOSEC) 20 MG capsule Take 1 capsule (20 mg total) by mouth daily. 90 capsule 0  . Spacer/Aero-Holding Chambers DEVI 1 Device by Does not apply route as needed. 1 each 0   No current facility-administered medications on file prior to visit.   No Known Allergies Social History   Social History  . Marital Status: Divorced    Spouse Name: N/A  . Number of Children: 3  . Years of Education: 9   Occupational History  . RetiredAnimal nutritionist    Social History Main Topics  . Smoking status: Former Smoker -- 1.00 packs/day    Types: Cigarettes  . Smokeless tobacco: Never Used  . Alcohol Use: No  . Drug Use: No  . Sexual Activity: Not Currently   Other Topics Concern  . Not on file  Social History Narrative   Grew up in [place] area.    Left school after 9th grade. No father and had to work.    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       Health Care POA:    Emergency Contact: sister, Hector Brunswick (h) [phone]   End of Life Plan:    Who lives with you: self   Any pets: Lorrin Goodell   Diet: Pt limits sugars and starches and focuses on vegetables an protein. Pt has lost 50 lbs over several years.   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   Seatbelts: Pt reports wearing seatbelt when in vehicle.   Sun Exposure/Protection: Pt does not use sun protectin.   Hobbies: Racing, TV, gardening, Architect            Review of Systems  All other systems reviewed and are negative.      Objective:   Physical Exam  Constitutional: He is oriented to person, place, and time. He appears well-developed and well-nourished. No distress.  Neck: Neck supple. No JVD present. No thyromegaly present.  Cardiovascular: Normal rate, regular rhythm, normal heart sounds and intact distal pulses.   No murmur heard. Pulmonary/Chest: Effort normal. No respiratory distress. He has wheezes. He  has no rales.  Abdominal: Soft. Bowel sounds are normal. He exhibits no distension. There is no tenderness. There is no rebound and no guarding.  Musculoskeletal: He exhibits edema.  Lymphadenopathy:    He has no cervical adenopathy.  Neurological: He is alert and oriented to person, place, and time. He has normal reflexes. No cranial nerve deficit. He exhibits normal muscle tone. Coordination normal.  Skin: He is not diaphoretic.  Vitals reviewed.         Assessment & Plan:  Chronic systolic heart failure - Plan: furosemide (LASIX) 40 MG tablet  Edema  COPD (chronic obstructive pulmonary disease) with chronic bronchitis  Cerumen impaction, bilateral  Continue Lasix 40 mg by mouth daily for leg swelling. Patient can use this medication on a when necessary basis. Continue stiolto 2 inhalations daily. I congratulated the patient on his smoking cessation efforts. Recheck lab work in 6 months. Cerumen impaction was removed bilaterally using irrigation/lavage. Patient will be due for a prostate exam at his follow-up in 6 months

## 2014-11-25 ENCOUNTER — Other Ambulatory Visit: Payer: Self-pay | Admitting: Family Medicine

## 2014-12-19 ENCOUNTER — Other Ambulatory Visit: Payer: Self-pay | Admitting: Family Medicine

## 2014-12-19 DIAGNOSIS — Z8711 Personal history of peptic ulcer disease: Secondary | ICD-10-CM

## 2014-12-19 MED ORDER — OMEPRAZOLE 20 MG PO CPDR: 20.0000 mg | DELAYED_RELEASE_CAPSULE | Freq: Every day | ORAL | Status: DC

## 2015-01-06 ENCOUNTER — Other Ambulatory Visit: Payer: Self-pay | Admitting: Family Medicine

## 2015-02-09 ENCOUNTER — Other Ambulatory Visit: Payer: Self-pay | Admitting: Family Medicine

## 2015-02-10 ENCOUNTER — Other Ambulatory Visit: Payer: Self-pay | Admitting: Family Medicine

## 2015-04-18 ENCOUNTER — Telehealth: Payer: Self-pay | Admitting: Family Medicine

## 2015-04-18 NOTE — Telephone Encounter (Signed)
PA Submitted through [email or web address] for Stiolto   DX: J44.9 COPD (chronic obstructive pulmonary disease) with chronic bronchitis    Z00.17 Chronic systolic heart failure

## 2015-04-19 MED ORDER — TIOTROPIUM BROMIDE-OLODATEROL 2.5-2.5 MCG/ACT IN AERS: 2.0000 | INHALATION_SPRAY | Freq: Every day | RESPIRATORY_TRACT | Status: DC

## 2015-04-19 NOTE — Telephone Encounter (Signed)
Med approved through 04/17/16 - pharm aware

## 2015-04-24 ENCOUNTER — Ambulatory Visit (INDEPENDENT_AMBULATORY_CARE_PROVIDER_SITE_OTHER): Payer: Medicare Other | Admitting: Family Medicine

## 2015-04-24 ENCOUNTER — Encounter: Payer: Self-pay | Admitting: Family Medicine

## 2015-04-24 VITALS — BP 112/64 | HR 74 | Temp 98.1°F | Resp 18 | Ht 72.0 in | Wt 232.0 lb

## 2015-04-24 DIAGNOSIS — I5022 Chronic systolic (congestive) heart failure: Secondary | ICD-10-CM | POA: Diagnosis not present

## 2015-04-24 DIAGNOSIS — I10 Essential (primary) hypertension: Secondary | ICD-10-CM

## 2015-04-24 DIAGNOSIS — Z720 Tobacco use: Secondary | ICD-10-CM | POA: Diagnosis not present

## 2015-04-24 DIAGNOSIS — J449 Chronic obstructive pulmonary disease, unspecified: Secondary | ICD-10-CM | POA: Diagnosis not present

## 2015-04-24 DIAGNOSIS — I2510 Atherosclerotic heart disease of native coronary artery without angina pectoris: Secondary | ICD-10-CM | POA: Diagnosis not present

## 2015-04-24 DIAGNOSIS — E782 Mixed hyperlipidemia: Secondary | ICD-10-CM

## 2015-04-24 NOTE — Progress Notes (Signed)
Subjective:    Patient ID: Bradley Meadows, male    DOB: 23-Sep-1940, 75 y.o.   MRN: [id number]  HPI 10/05/14 Patient is a 75 year old white male who is here today to establish care. Past medical history is significant for coronary artery disease. Patient states he had a heart attack in the remote past. He denies any stents. He is currently taking an aspirin 81 mg by mouth daily. He also has a long-standing history of COPD. He continues to smoke. He is currently using Combivent 4 times a day. He reports dyspnea on exertion as well as significant fatigue.[name] He has no energy. He also has a history of hypertension as well as hyperlipidemia. He is overdue for fasting lipid panel. Given his history of coronary artery disease his goal LDL cholesterol is less than 70. His blood pressures well controlled today 110/60. He is overdue for colonoscopy as the patient states he has never had one. He is also due for an annual prostate exam.. Patient has had Pneumovax 23. He has had Zostavax. He has had a tetanus shot. He is due for a flu shot today as well as Prevnar 13.    At that time, my plan was: Patient received a flu shot today as well as Prevnar 13. Patient defers the colonoscopy at the present time. He would like to do the prostate exam at his next visit. His blood pressure is well controlled today. Given his history of coronary artery disease, his goal LDL cholesterol is less than 70. He is currently taking an ACE inhibitor, beta blocker, as well as a 81 mg aspirin on a daily basis. I encouraged smoking cessation and to that end, the patient asked me to send a prescription for NicoDerm patches to his pharmacy. I gladly did that. He has a history of congestive heart failure with an ejection fraction of 45-50%. He is on a beta blocker as well as an ACE inhibitor. He uses Lasix for swelling. His ejection fraction is not low enough yet to warrant spironolactone. Again I encouraged smoking cessation and sodium restriction.  I would like the patient to return fasting tomorrow morning for a CBC, CMP, fasting lipid panel, PSA. I will try to optimize his management of COPD to see if this would improve his dyspnea. I recommended he use Combivent only as needed and begin stiolto 2 inh qday.  Recheck in 2 weeks  10/19/14 Patient's lab work was excellent. His breathing improved significantly on stiolto.  Furthermore he has decreased his smoking to 2 or 3 cigarettes per day. I congratulated the patient on his efforts and encouraged him to continue to work towards quitting smoking. He is only using Combivent at night. His immunizations are up-to-date. He was unable to tolerate compression stockings. He would like to continue Lasix 40 mg a day as needed for leg swelling. He also complains of cerumen impaction in both years. Sure enough on examination he has bilateral cerumen impactions.  At that time, my plan was: Continue Lasix 40 mg by mouth daily for leg swelling. Patient can use this medication on a when necessary basis. Continue stiolto 2 inhalations daily. I congratulated the patient on his smoking cessation efforts. Recheck lab work in 6 months. Cerumen impaction was removed bilaterally using irrigation/lavage. Patient will be due for a prostate exam at his follow-up in 6 months  04/24/15 Patient is here today for follow-up. He denies any chest pain. He denies any angina. He denies any orthopnea or paroxysmal nocturnal dyspnea.  He has discontinued Lasix. He does not have any substantial swelling in his legs. There is no JVD today on exam. He continues to use stiolto once daily and uses Combivent only at night. This manages his breathing relatively well. He recently tried to quit smoking and so far has been successful. He is overdue for fasting lab work. He is on a combination of a beta blocker as well as an ACE inhibitor for his history of congestive heart failure with an ejection fraction of 45-50%. He is also on atorvastatin given  his history of ASCVD with a goal LDL cholesterol less than 70  Past Medical History  Diagnosis Date  . Hyperlipidemia   . Hypertension   . COPD (chronic obstructive pulmonary disease) ([place])   . GERD (gastroesophageal reflux disease)     ulcers  . CHF (congestive heart failure) (HCC)     ef=45-50%   Past Surgical History  Procedure Laterality Date  . Joint replacement      knee both  . Eye surgery      cataracts   Current Outpatient Prescriptions on File Prior to Visit  Medication Sig Dispense Refill  . albuterol-ipratropium (COMBIVENT) 18-103 MCG/ACT inhaler Inhale 2 puffs into the lungs every 6 (six) hours as needed for wheezing or shortness of breath.    [name] aspirin 81 MG tablet Take 1 tablet (81 mg total) by mouth daily. 1 tablet 3  . atorvastatin (LIPITOR) 40 MG tablet Take 1 tablet (40 mg total) by mouth daily. 90 tablet 3  . COMBIVENT RESPIMAT 20-100 MCG/ACT AERS respimat INHALE 1 PUFF BY MOUTH EVERY 6 HOURS AS NEEDED FOR WHEEZING 4 g 5  . ferrous sulfate 325 (65 FE) MG tablet TAKE 1 TABLET BY MOUTH EVERY DAY WITH BREAKFAST 90 tablet 3  . HYDROcodone-acetaminophen (NORCO/VICODIN) 5-325 MG per tablet TK 1 T PO  BID. MUST LAST 30 DAYS  0  . lisinopril-hydrochlorothiazide (PRINZIDE,ZESTORETIC) 20-12.5 MG tablet TAKE 1 TABLET BY MOUTH DAILY 90 tablet 3  . metoprolol tartrate (LOPRESSOR) 25 MG tablet Take 1 tablet (25 mg total) by mouth 2 (two) times daily. 180 tablet 3  . Multiple Vitamin (MULTIVITAMIN) tablet Take 1 tablet by mouth daily. Centrum Silver    . nicotine (NICODERM CQ) 21 mg/24hr patch Place 1 patch (21 mg total) onto the skin daily. 28 patch 3  . omeprazole (PRILOSEC) 20 MG capsule Take 1 capsule (20 mg total) by mouth daily. 90 capsule 3  . Spacer/Aero-Holding Chambers DEVI 1 Device by Does not apply route as needed. 1 each 0  . Tiotropium Bromide-Olodaterol (STIOLTO RESPIMAT) 2.5-2.5 MCG/ACT AERS Inhale 2 puffs into the lungs daily. 1 Inhaler 11  . furosemide (LASIX)  40 MG tablet TAKE 1 TABLET BY MOUTH ONCE DAILY as needed for swelling (Patient not taking: Reported on 04/24/2015) 90 tablet 0   No current facility-administered medications on file prior to visit.   No Known Allergies Social History   Social History  . Marital Status: Divorced    Spouse Name: N/A  . Number of Children: 3  . Years of Education: 9   Occupational History  . RetiredAnimal nutritionist    Social History Main Topics  . Smoking status: Former Smoker -- 1.00 packs/day    Types: Cigarettes  . Smokeless tobacco: Never Used  . Alcohol Use: No  . Drug Use: No  . Sexual Activity: Not Currently   Other Topics Concern  . Not on file   Social History Narrative   Grew up  in [place] area.    Left school after 9th grade. No father and had to work.    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       Health Care POA:    Emergency Contact: sister, Hector Brunswick (h) [phone]   End of Life Plan:    Who lives with you: self   Any pets: Lorrin Goodell   Diet: Pt limits sugars and starches and focuses on vegetables an protein. Pt has lost 50 lbs over several years.   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   Seatbelts: Pt reports wearing seatbelt when in vehicle.   Sun Exposure/Protection: Pt does not use sun protectin.   Hobbies: Racing, TV, gardening, Architect            Review of Systems  All other systems reviewed and are negative.      Objective:   Physical Exam  Constitutional: He is oriented to person, place, and time. He appears well-developed and well-nourished. No distress.  Neck: Neck supple. No JVD present. No thyromegaly present.  Cardiovascular: Normal rate, regular rhythm, normal heart sounds and intact distal pulses.   No murmur heard. Pulmonary/Chest: Effort normal. No respiratory distress. He has wheezes. He has no rales.  Abdominal: Soft. Bowel sounds are normal. He exhibits no  distension. There is no tenderness. There is no rebound and no guarding.  Musculoskeletal: He exhibits edema.  Lymphadenopathy:    He has no cervical adenopathy.  Neurological: He is alert and oriented to person, place, and time. He has normal reflexes. No cranial nerve deficit. He exhibits normal muscle tone. Coordination normal.  Skin: He is not diaphoretic.  Vitals reviewed.         Assessment & Plan:  Chronic systolic heart failure (HCC)  COPD (chronic obstructive pulmonary disease) with chronic bronchitis ([facility])  HYPERTENSION, BENIGN SYSTEMIC  Mixed hyperlipidemia  Tobacco abuse  ASCVD (arteriosclerotic cardiovascular disease) - Plan: COMPLETE METABOLIC PANEL WITH GFR, CBC with Differential/Platelet, Lipid panel  I congratulated the patient on his smoking cessation. I encouraged him to keep up the good work. His blood pressure today is excellent and requires no further medication. He is on appropriate medications for his congestive heart failure including a beta blocker as well as an ACE inhibitor. At the present time I did do not see a reason to switch the patient to entresto.  Currently his COPD is stable. He has not had any recent exacerbations. I will also check a fasting lipid panel. His goal LDL cholesterol is less than 70. He is also asking if I will prescribe his pain medication for him. He takes hydrocodone/acetaminophen 5/325 one by mouth twice a day for chronic pain in his shoulders and arthritic pains in his hands and knees. It will be easier for him to come to this office to pick up prescription rather than going all the way to his orthopedist office. I stated that I would be willing to do this. I will prescribe him 60 pills every month

## 2015-04-25 ENCOUNTER — Other Ambulatory Visit: Payer: Medicare Other

## 2015-04-25 DIAGNOSIS — I2510 Atherosclerotic heart disease of native coronary artery without angina pectoris: Secondary | ICD-10-CM | POA: Diagnosis not present

## 2015-04-25 LAB — CBC WITH DIFFERENTIAL/PLATELET
BASOS ABS: 0 10*3/uL (ref 0.0–0.1)
Basophils Relative: 0 % (ref 0–1)
EOS ABS: 0.3 10*3/uL (ref 0.0–0.7)
EOS PCT: 4 % (ref 0–5)
HCT: 42.6 % (ref 39.0–52.0)
Hemoglobin: 14.9 g/dL (ref 13.0–17.0)
LYMPHS ABS: 1.6 10*3/uL (ref 0.7–4.0)
Lymphocytes Relative: 23 % (ref 12–46)
MCH: 32.5 pg (ref 26.0–34.0)
MCHC: 35 g/dL (ref 30.0–36.0)
MCV: 92.8 fL (ref 78.0–100.0)
MONOS PCT: 10 % (ref 3–12)
MPV: 10.6 fL (ref 8.6–12.4)
Monocytes Absolute: 0.7 10*3/uL (ref 0.1–1.0)
Neutro Abs: 4.3 10*3/uL (ref 1.7–7.7)
Neutrophils Relative %: 63 % (ref 43–77)
PLATELETS: 168 10*3/uL (ref 150–400)
RBC: 4.59 MIL/uL (ref 4.22–5.81)
RDW: 14 % (ref 11.5–15.5)
WBC: 6.9 10*3/uL (ref 4.0–10.5)

## 2015-04-25 LAB — COMPLETE METABOLIC PANEL WITH GFR
ALT: 12 U/L (ref 9–46)
AST: 16 U/L (ref 10–35)
Albumin: 4.1 g/dL (ref 3.6–5.1)
Alkaline Phosphatase: 60 U/L (ref 40–115)
BILIRUBIN TOTAL: 0.8 mg/dL (ref 0.2–1.2)
BUN: 8 mg/dL (ref 7–25)
CHLORIDE: 92 mmol/L — AB (ref 98–110)
CO2: 29 mmol/L (ref 20–31)
CREATININE: 0.75 mg/dL (ref 0.70–1.18)
Calcium: 9.4 mg/dL (ref 8.6–10.3)
GFR, Est African American: >89 mL/min (ref >=60)
GFR, Est Non African American: >89 mL/min (ref >=60)
Glucose, Bld: 85 mg/dL (ref 70–99)
POTASSIUM: 4.6 mmol/L (ref 3.5–5.3)
SODIUM: 131 mmol/L — AB (ref 135–146)
Total Protein: 6.3 g/dL (ref 6.1–8.1)

## 2015-04-25 LAB — LIPID PANEL
Cholesterol: 114 mg/dL — ABNORMAL LOW (ref 125–200)
HDL: 35 mg/dL — ABNORMAL LOW (ref >=40)
LDL Cholesterol: 61 mg/dL (ref <130)
Total CHOL/HDL Ratio: 3.3 Ratio (ref <=5.0)
Triglycerides: 92 mg/dL (ref <150)
VLDL: 18 mg/dL (ref <30)

## 2015-04-26 ENCOUNTER — Encounter: Payer: Self-pay | Admitting: Family Medicine

## 2015-06-10 ENCOUNTER — Other Ambulatory Visit: Payer: Self-pay | Admitting: Family Medicine

## 2015-06-14 ENCOUNTER — Other Ambulatory Visit: Payer: Self-pay | Admitting: *Deleted

## 2015-06-14 MED ORDER — FERROUS SULFATE 325 (65 FE) MG PO TABS: ORAL_TABLET | ORAL | Status: DC

## 2015-06-14 NOTE — Telephone Encounter (Signed)
Received fax requesting refill on [place].   Refill appropriate and filled per protocol.

## 2015-06-24 ENCOUNTER — Other Ambulatory Visit: Payer: Self-pay | Admitting: Family Medicine

## 2015-06-29 ENCOUNTER — Other Ambulatory Visit: Payer: Self-pay | Admitting: Family Medicine

## 2015-06-29 DIAGNOSIS — I10 Essential (primary) hypertension: Secondary | ICD-10-CM

## 2015-06-29 MED ORDER — METOPROLOL TARTRATE 25 MG PO TABS: 25.0000 mg | ORAL_TABLET | Freq: Two times a day (BID) | ORAL | Status: DC

## 2015-06-29 NOTE — Telephone Encounter (Signed)
ok 

## 2015-06-29 NOTE — Telephone Encounter (Signed)
LRF 09/19/14  OK refill?

## 2015-07-03 MED ORDER — HYDROCODONE-ACETAMINOPHEN 5-325 MG PO TABS: ORAL_TABLET | ORAL | Status: DC

## 2015-07-03 NOTE — Telephone Encounter (Signed)
Pt aware ready for pick up 

## 2015-07-03 NOTE — Telephone Encounter (Signed)
rx printed

## 2015-07-11 ENCOUNTER — Other Ambulatory Visit: Payer: Self-pay | Admitting: Family Medicine

## 2015-07-13 ENCOUNTER — Other Ambulatory Visit: Payer: Self-pay | Admitting: Family Medicine

## 2015-07-13 NOTE — Telephone Encounter (Signed)
Refill appropriate and filled per protocol. 

## 2015-08-14 ENCOUNTER — Ambulatory Visit
Admission: RE | Admit: 2015-08-14 | Discharge: 2015-08-14 | Disposition: A | Discharge status: Home / Self Care | Payer: Medicare Other | Source: Ambulatory Visit | Attending: Family Medicine | Admitting: Family Medicine

## 2015-08-14 ENCOUNTER — Encounter: Payer: Self-pay | Admitting: Family Medicine

## 2015-08-14 ENCOUNTER — Ambulatory Visit (INDEPENDENT_AMBULATORY_CARE_PROVIDER_SITE_OTHER): Payer: Medicare Other | Admitting: Family Medicine

## 2015-08-14 VITALS — BP 132/68 | HR 70 | Temp 98.2°F | Resp 20 | Wt 220.0 lb

## 2015-08-14 DIAGNOSIS — I2510 Atherosclerotic heart disease of native coronary artery without angina pectoris: Secondary | ICD-10-CM | POA: Diagnosis not present

## 2015-08-14 DIAGNOSIS — J441 Chronic obstructive pulmonary disease with (acute) exacerbation: Secondary | ICD-10-CM | POA: Diagnosis not present

## 2015-08-14 DIAGNOSIS — R0609 Other forms of dyspnea: Secondary | ICD-10-CM | POA: Diagnosis not present

## 2015-08-14 DIAGNOSIS — J449 Chronic obstructive pulmonary disease, unspecified: Secondary | ICD-10-CM

## 2015-08-14 DIAGNOSIS — I5022 Chronic systolic (congestive) heart failure: Secondary | ICD-10-CM | POA: Diagnosis not present

## 2015-08-14 DIAGNOSIS — R0602 Shortness of breath: Secondary | ICD-10-CM | POA: Diagnosis not present

## 2015-08-14 LAB — COMPLETE METABOLIC PANEL WITH GFR
ALBUMIN: 3.9 g/dL (ref 3.6–5.1)
ALT: 12 U/L (ref 9–46)
AST: 16 U/L (ref 10–35)
Alkaline Phosphatase: 70 U/L (ref 40–115)
BILIRUBIN TOTAL: 0.8 mg/dL (ref 0.2–1.2)
BUN: 8 mg/dL (ref 7–25)
CALCIUM: 9.2 mg/dL (ref 8.6–10.3)
CHLORIDE: 90 mmol/L — AB (ref 98–110)
CO2: 22 mmol/L (ref 20–31)
CREATININE: 0.81 mg/dL (ref 0.70–1.18)
GFR, Est Non African American: 87 mL/min (ref >=60)
Glucose, Bld: 93 mg/dL (ref 70–99)
Potassium: 4.1 mmol/L (ref 3.5–5.3)
Sodium: 127 mmol/L — ABNORMAL LOW (ref 135–146)
TOTAL PROTEIN: 6 g/dL — AB (ref 6.1–8.1)

## 2015-08-14 LAB — CBC WITH DIFFERENTIAL/PLATELET
Basophils Absolute: 0 cells/uL (ref 0–200)
Basophils Relative: 0 %
Eosinophils Absolute: 158 cells/uL (ref 15–500)
Eosinophils Relative: 2 %
HEMATOCRIT: 38.8 % (ref 38.5–50.0)
Hemoglobin: 13.6 g/dL (ref 13.0–17.0)
LYMPHS PCT: 27 %
Lymphs Abs: 2133 cells/uL (ref 850–3900)
MCH: 31.4 pg (ref 27.0–33.0)
MCHC: 35.1 g/dL (ref 32.0–36.0)
MCV: 89.6 fL (ref 80.0–100.0)
MONO ABS: 632 {cells}/uL (ref 200–950)
MONOS PCT: 8 %
MPV: 10.6 fL (ref 7.5–12.5)
NEUTROS PCT: 63 %
Neutro Abs: 4977 cells/uL (ref 1500–7800)
PLATELETS: 205 10*3/uL (ref 140–400)
RBC: 4.33 MIL/uL (ref 4.20–5.80)
RDW: 13.9 % (ref 11.0–15.0)
WBC: 7.9 10*3/uL (ref 3.8–10.8)

## 2015-08-14 MED ORDER — PREDNISONE 20 MG PO TABS: ORAL_TABLET | ORAL | Status: DC

## 2015-08-14 MED ORDER — LEVOFLOXACIN 500 MG PO TABS: 500.0000 mg | ORAL_TABLET | Freq: Every day | ORAL | Status: DC

## 2015-08-14 MED ORDER — METHYLPREDNISOLONE ACETATE 40 MG/ML IJ SUSP
60.0000 mg | Freq: Once | INTRAMUSCULAR | Status: AC
  Administered 2015-08-14: 60 mg via INTRAMUSCULAR

## 2015-08-14 MED ORDER — HYDROCODONE-ACETAMINOPHEN 5-325 MG PO TABS: ORAL_TABLET | ORAL | Status: DC

## 2015-08-14 NOTE — Progress Notes (Signed)
Subjective:    Patient ID: Bradley Meadows, male    DOB: 06-11-1940, 75 y.o.   MRN: [id number]  HPI 10/05/14 Patient is a 75 year old white male who is here today to establish care. Past medical history is significant for coronary artery disease. Patient states he had a heart attack in the remote past. He denies any stents. He is currently taking an aspirin 81 mg by mouth daily. He also has a long-standing history of COPD. He continues to smoke. He is currently using Combivent 4 times a day. He reports dyspnea on exertion as well as significant fatigue.[name] He has no energy. He also has a history of hypertension as well as hyperlipidemia. He is overdue for fasting lipid panel. Given his history of coronary artery disease his goal LDL cholesterol is less than 70. His blood pressures well controlled today 110/60. He is overdue for colonoscopy as the patient states he has never had one. He is also due for an annual prostate exam.. Patient has had Pneumovax 23. He has had Zostavax. He has had a tetanus shot. He is due for a flu shot today as well as Prevnar 13.    At that time, my plan was: Patient received a flu shot today as well as Prevnar 13. Patient defers the colonoscopy at the present time. He would like to do the prostate exam at his next visit. His blood pressure is well controlled today. Given his history of coronary artery disease, his goal LDL cholesterol is less than 70. He is currently taking an ACE inhibitor, beta blocker, as well as a 81 mg aspirin on a daily basis. I encouraged smoking cessation and to that end, the patient asked me to send a prescription for NicoDerm patches to his pharmacy. I gladly did that. He has a history of congestive heart failure with an ejection fraction of 45-50%. He is on a beta blocker as well as an ACE inhibitor. He uses Lasix for swelling. His ejection fraction is not low enough yet to warrant spironolactone. Again I encouraged smoking cessation and sodium restriction.  I would like the patient to return fasting tomorrow morning for a CBC, CMP, fasting lipid panel, PSA. I will try to optimize his management of COPD to see if this would improve his dyspnea. I recommended he use Combivent only as needed and begin stiolto 2 inh qday.  Recheck in 2 weeks  10/19/14 Patient's lab work was excellent. His breathing improved significantly on stiolto.  Furthermore he has decreased his smoking to 2 or 3 cigarettes per day. I congratulated the patient on his efforts and encouraged him to continue to work towards quitting smoking. He is only using Combivent at night. His immunizations are up-to-date. He was unable to tolerate compression stockings. He would like to continue Lasix 40 mg a day as needed for leg swelling. He also complains of cerumen impaction in both years. Sure enough on examination he has bilateral cerumen impactions.  At that time, my plan was: Continue Lasix 40 mg by mouth daily for leg swelling. Patient can use this medication on a when necessary basis. Continue stiolto 2 inhalations daily. I congratulated the patient on his smoking cessation efforts. Recheck lab work in 6 months. Cerumen impaction was removed bilaterally using irrigation/lavage. Patient will be due for a prostate exam at his follow-up in 6 months  04/24/15 Patient is here today for follow-up. He denies any chest pain. He denies any angina. He denies any orthopnea or paroxysmal nocturnal dyspnea.  He has discontinued Lasix. He does not have any substantial swelling in his legs. There is no JVD today on exam. He continues to use stiolto once daily and uses Combivent only at night. This manages his breathing relatively well. He recently tried to quit smoking and so far has been successful. He is overdue for fasting lab work. He is on a combination of a beta blocker as well as an ACE inhibitor for his history of congestive heart failure with an ejection fraction of 45-50%. He is also on atorvastatin given  his history of ASCVD with a goal LDL cholesterol less than 70.  At that time, my plan was: I congratulated the patient on his smoking cessation. I encouraged him to keep up the good work. His blood pressure today is excellent and requires no further medication. He is on appropriate medications for his congestive heart failure including a beta blocker as well as an ACE inhibitor. At the present time I did do not see a reason to switch the patient to entresto.  Currently his COPD is stable. He has not had any recent exacerbations. I will also check a fasting lipid panel. His goal LDL cholesterol is less than 70. He is also asking if I will prescribe his pain medication for him. He takes hydrocodone/acetaminophen 5/325 one by mouth twice a day for chronic pain in his shoulders and arthritic pains in his hands and knees. It will be easier for him to come to this office to pick up prescription rather than going all the way to his orthopedist office. I stated that I would be willing to do this. I will prescribe him 60 pills every month  08/14/15 Patient reports 2 weeks of worsening dyspnea on exertion. He is still taking stiolto daily and combivent bid.  However he sees very little benefit from his breathing medicines. He is smoking again. He is audibly wheezing today on exam. He has markedly diminished breath sounds bilaterally with rhonchorous breath sounds and expiratory wheezing. Heart was in normal sinus rhythm with frequent PVCs. His weight is down approximately 12 pounds since his last office visit. He does have trace pretibial edema bilaterally but not severe. There is no evidence of JVD or overt failure. Past medical history as documented above is significant for congestive heart failure but the patient does not appear fluid overloaded today on examination. He denies any chest pain or angina  Past Medical History  Diagnosis Date  . Hyperlipidemia   . Hypertension   . COPD (chronic obstructive pulmonary  disease) ([place])   . GERD (gastroesophageal reflux disease)     ulcers  . CHF (congestive heart failure) ([facility])     ef=45-50%   Past Surgical History  Procedure Laterality Date  . Joint replacement      knee both  . Eye surgery      cataracts   Current Outpatient Prescriptions on File Prior to Visit  Medication Sig Dispense Refill  . albuterol-ipratropium (COMBIVENT) 18-103 MCG/ACT inhaler Inhale 2 puffs into the lungs every 6 (six) hours as needed for wheezing or shortness of breath.    [name] aspirin 81 MG tablet Take 1 tablet (81 mg total) by mouth daily. 1 tablet 3  . atorvastatin (LIPITOR) 40 MG tablet TAKE 1 TABLET(40 MG) BY MOUTH DAILY 90 tablet 0  . COMBIVENT RESPIMAT 20-100 MCG/ACT AERS respimat INHALE 1 PUFF BY MOUTH EVERY 6 HOURS AS NEEDED FOR WHEEZING 4 g 5  . ferrous sulfate 325 (65 FE) MG tablet  TAKE 1 TABLET BY MOUTH EVERY DAY WITH BREAKFAST 90 tablet 3  . furosemide (LASIX) 40 MG tablet TAKE 1 TABLET BY MOUTH ONCE DAILY as needed for swelling 90 tablet 0  . HYDROcodone-acetaminophen (NORCO/VICODIN) 5-325 MG tablet TK 1 T PO  BID. MUST LAST 30 DAYS 30 tablet 0  . lisinopril-hydrochlorothiazide (PRINZIDE,ZESTORETIC) 20-12.5 MG tablet TAKE 1 TABLET BY MOUTH DAILY 90 tablet 3  . metoprolol tartrate (LOPRESSOR) 25 MG tablet Take 1 tablet (25 mg total) by mouth 2 (two) times daily. 180 tablet 3  . Multiple Vitamin (MULTIVITAMIN) tablet Take 1 tablet by mouth daily. Centrum Silver    . nicotine (NICODERM CQ) 21 mg/24hr patch Place 1 patch (21 mg total) onto the skin daily. 28 patch 3  . omeprazole (PRILOSEC) 20 MG capsule Take 1 capsule (20 mg total) by mouth daily. 90 capsule 3  . Spacer/Aero-Holding Chambers DEVI 1 Device by Does not apply route as needed. 1 each 0  . Tiotropium Bromide-Olodaterol (STIOLTO RESPIMAT) 2.5-2.5 MCG/ACT AERS Inhale 2 puffs into the lungs daily. 1 Inhaler 11   No current facility-administered medications on file prior to visit.   No Known  Allergies Social History   Social History  . Marital Status: Divorced    Spouse Name: N/A  . Number of Children: 3  . Years of Education: 9   Occupational History  . RetiredAnimal nutritionist    Social History Main Topics  . Smoking status: Former Smoker -- 1.00 packs/day    Types: Cigarettes  . Smokeless tobacco: Never Used  . Alcohol Use: No  . Drug Use: No  . Sexual Activity: Not Currently   Other Topics Concern  . Not on file   Social History Narrative   Grew up in [place] area.    Left school after 9th grade. No father and had to work.    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       Health Care POA:    Emergency Contact: sister, Hector Brunswick (h) [phone]   End of Life Plan:    Who lives with you: self   Any pets: Lorrin Goodell   Diet: Pt limits sugars and starches and focuses on vegetables an protein. Pt has lost 50 lbs over several years.   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   Seatbelts: Pt reports wearing seatbelt when in vehicle.   Sun Exposure/Protection: Pt does not use sun protectin.   Hobbies: Racing, TV, gardening, Architect            Review of Systems  All other systems reviewed and are negative.      Objective:   Physical Exam  Constitutional: He is oriented to person, place, and time. He appears well-developed and well-nourished. No distress.  Neck: Neck supple. No JVD present. No thyromegaly present.  Cardiovascular: Normal rate, regular rhythm, normal heart sounds and intact distal pulses.   No murmur heard. Pulmonary/Chest: Effort normal. No respiratory distress. He has decreased breath sounds. He has wheezes. He has rhonchi. He has no rales.  Abdominal: Soft. Bowel sounds are normal. He exhibits no distension. There is no tenderness. There is no rebound and no guarding.  Musculoskeletal: He exhibits edema.  Lymphadenopathy:    He has no cervical adenopathy.   Neurological: He is alert and oriented to person, place, and time. He has normal reflexes. No cranial nerve deficit. He exhibits normal muscle tone. Coordination normal.  Skin: He is not diaphoretic.  Vitals reviewed.         Assessment & Plan:  COPD (chronic obstructive pulmonary disease) with chronic bronchitis ([facility])  COPD exacerbation ([facility])  Dyspnea on exertion - Plan: ECHOCARDIOGRAM COMPLETE, COMPLETE METABOLIC PANEL WITH GFR, CBC with Differential/Platelet, Brain natriuretic peptide, DG Chest 2 View  Chronic systolic heart failure ([place])  Patient seems to be suffering from a COPD exacerbation. I will give the patient 60 mg of Depo-Medrol IM 1 now. Patient was also given DuoNeb 3 mg of albuterol and 0.5 mg of Atrovent 1 now. I want him to go immediately for a chest x-ray. I will obtain a CBC to evaluate for anemia. Given his weight loss and he also reports trace hemoptysis, I want him to go get a chest x-ray immediately. I will also repeat an echocardiogram of his heart. Ejection fraction was 45% 2 years ago but I will evaluate for worsening of his systolic function. Begin prednisone 60 mg a day in addition to Levaquin 500 mg a day and recheck in 48 hours or sooner if worse.  Combivent 2 puff QID.  Quit Smoking!!!!

## 2015-08-15 LAB — BRAIN NATRIURETIC PEPTIDE: BRAIN NATRIURETIC PEPTIDE: 33 pg/mL (ref <100)

## 2015-08-16 ENCOUNTER — Encounter: Payer: Self-pay | Admitting: Family Medicine

## 2015-08-16 ENCOUNTER — Ambulatory Visit (INDEPENDENT_AMBULATORY_CARE_PROVIDER_SITE_OTHER): Payer: Medicare Other | Admitting: Family Medicine

## 2015-08-16 VITALS — BP 128/60 | HR 80 | Temp 98.5°F | Resp 20 | Wt 223.0 lb

## 2015-08-16 DIAGNOSIS — J441 Chronic obstructive pulmonary disease with (acute) exacerbation: Secondary | ICD-10-CM

## 2015-08-16 DIAGNOSIS — I2510 Atherosclerotic heart disease of native coronary artery without angina pectoris: Secondary | ICD-10-CM

## 2015-08-16 NOTE — Progress Notes (Signed)
Subjective:    Patient ID: Bradley Meadows, male    DOB: 02/12/1940, 75 y.o.   MRN: [id number]  HPI 10/05/14 Patient is a 75 year old white male who is here today to establish care. Past medical history is significant for coronary artery disease. Patient states he had a heart attack in the remote past. He denies any stents. He is currently taking an aspirin 81 mg by mouth daily. He also has a long-standing history of COPD. He continues to smoke. He is currently using Combivent 4 times a day. He reports dyspnea on exertion as well as significant fatigue.[name] He has no energy. He also has a history of hypertension as well as hyperlipidemia. He is overdue for fasting lipid panel. Given his history of coronary artery disease his goal LDL cholesterol is less than 70. His blood pressures well controlled today 110/60. He is overdue for colonoscopy as the patient states he has never had one. He is also due for an annual prostate exam.. Patient has had Pneumovax 23. He has had Zostavax. He has had a tetanus shot. He is due for a flu shot today as well as Prevnar 13.    At that time, my plan was: Patient received a flu shot today as well as Prevnar 13. Patient defers the colonoscopy at the present time. He would like to do the prostate exam at his next visit. His blood pressure is well controlled today. Given his history of coronary artery disease, his goal LDL cholesterol is less than 70. He is currently taking an ACE inhibitor, beta blocker, as well as a 81 mg aspirin on a daily basis. I encouraged smoking cessation and to that end, the patient asked me to send a prescription for NicoDerm patches to his pharmacy. I gladly did that. He has a history of congestive heart failure with an ejection fraction of 45-50%. He is on a beta blocker as well as an ACE inhibitor. He uses Lasix for swelling. His ejection fraction is not low enough yet to warrant spironolactone. Again I encouraged smoking cessation and sodium restriction.  I would like the patient to return fasting tomorrow morning for a CBC, CMP, fasting lipid panel, PSA. I will try to optimize his management of COPD to see if this would improve his dyspnea. I recommended he use Combivent only as needed and begin stiolto 2 inh qday.  Recheck in 2 weeks  10/19/14 Patient's lab work was excellent. His breathing improved significantly on stiolto.  Furthermore he has decreased his smoking to 2 or 3 cigarettes per day. I congratulated the patient on his efforts and encouraged him to continue to work towards quitting smoking. He is only using Combivent at night. His immunizations are up-to-date. He was unable to tolerate compression stockings. He would like to continue Lasix 40 mg a day as needed for leg swelling. He also complains of cerumen impaction in both years. Sure enough on examination he has bilateral cerumen impactions.  At that time, my plan was: Continue Lasix 40 mg by mouth daily for leg swelling. Patient can use this medication on a when necessary basis. Continue stiolto 2 inhalations daily. I congratulated the patient on his smoking cessation efforts. Recheck lab work in 6 months. Cerumen impaction was removed bilaterally using irrigation/lavage. Patient will be due for a prostate exam at his follow-up in 6 months  04/24/15 Patient is here today for follow-up. He denies any chest pain. He denies any angina. He denies any orthopnea or paroxysmal nocturnal dyspnea.  He has discontinued Lasix. He does not have any substantial swelling in his legs. There is no JVD today on exam. He continues to use stiolto once daily and uses Combivent only at night. This manages his breathing relatively well. He recently tried to quit smoking and so far has been successful. He is overdue for fasting lab work. He is on a combination of a beta blocker as well as an ACE inhibitor for his history of congestive heart failure with an ejection fraction of 45-50%. He is also on atorvastatin given  his history of ASCVD with a goal LDL cholesterol less than 70.  At that time, my plan was: I congratulated the patient on his smoking cessation. I encouraged him to keep up the good work. His blood pressure today is excellent and requires no further medication. He is on appropriate medications for his congestive heart failure including a beta blocker as well as an ACE inhibitor. At the present time I did do not see a reason to switch the patient to entresto.  Currently his COPD is stable. He has not had any recent exacerbations. I will also check a fasting lipid panel. His goal LDL cholesterol is less than 70. He is also asking if I will prescribe his pain medication for him. He takes hydrocodone/acetaminophen 5/325 one by mouth twice a day for chronic pain in his shoulders and arthritic pains in his hands and knees. It will be easier for him to come to this office to pick up prescription rather than going all the way to his orthopedist office. I stated that I would be willing to do this. I will prescribe him 60 pills every month  08/14/15 Patient reports 2 weeks of worsening dyspnea on exertion. He is still taking stiolto daily and combivent bid.  However he sees very little benefit from his breathing medicines. He is smoking again. He is audibly wheezing today on exam. He has markedly diminished breath sounds bilaterally with rhonchorous breath sounds and expiratory wheezing. Heart was in normal sinus rhythm with frequent PVCs. His weight is down approximately 12 pounds since his last office visit. He does have trace pretibial edema bilaterally but not severe. There is no evidence of JVD or overt failure. Past medical history as documented above is significant for congestive heart failure but the patient does not appear fluid overloaded today on examination. He denies any chest pain or angina.  My plan was: Patient seems to be suffering from a COPD exacerbation. I will give the patient 60 mg of Depo-Medrol IM  1 now. Patient was also given DuoNeb 3 mg of albuterol and 0.5 mg of Atrovent 1 now. I want him to go immediately for a chest x-ray. I will obtain a CBC to evaluate for anemia. Given his weight loss and he also reports trace hemoptysis, I want him to go get a chest x-ray immediately. I will also repeat an echocardiogram of his heart. Ejection fraction was 45% 2 years ago but I will evaluate for worsening of his systolic function. Begin prednisone 60 mg a day in addition to Levaquin 500 mg a day and recheck in 48 hours or sooner if worse.  Combivent 2 puff QID.  Quit Smoking!!!!  08/16/15 Patient is doing substantially better. He denies any fever or chest pain. His cough has improved. He is coughing up less mucus. His wheezing has improved. Chest x-ray revealed chronic bronchitic changes but no evidence of pneumonia or CHF. Lab work revealed a normal BNP and no  evidence of anemia.  Past Medical History  Diagnosis Date  . Hyperlipidemia   . Hypertension   . COPD (chronic obstructive pulmonary disease) ([place])   . GERD (gastroesophageal reflux disease)     ulcers  . CHF (congestive heart failure) ([facility])     ef=45-50%   Past Surgical History  Procedure Laterality Date  . Joint replacement      knee both  . Eye surgery      cataracts   Current Outpatient Prescriptions on File Prior to Visit  Medication Sig Dispense Refill  . albuterol-ipratropium (COMBIVENT) 18-103 MCG/ACT inhaler Inhale 2 puffs into the lungs every 6 (six) hours as needed for wheezing or shortness of breath.    [name] aspirin 81 MG tablet Take 1 tablet (81 mg total) by mouth daily. 1 tablet 3  . atorvastatin (LIPITOR) 40 MG tablet TAKE 1 TABLET(40 MG) BY MOUTH DAILY 90 tablet 0  . COMBIVENT RESPIMAT 20-100 MCG/ACT AERS respimat INHALE 1 PUFF BY MOUTH EVERY 6 HOURS AS NEEDED FOR WHEEZING 4 g 5  . ferrous sulfate 325 (65 FE) MG tablet TAKE 1 TABLET BY MOUTH EVERY DAY WITH BREAKFAST 90 tablet 3  . furosemide (LASIX) 40 MG tablet TAKE  1 TABLET BY MOUTH ONCE DAILY as needed for swelling 90 tablet 0  . HYDROcodone-acetaminophen (NORCO/VICODIN) 5-325 MG tablet TK 1 T PO  BID. MUST LAST 30 DAYS 60 tablet 0  . levofloxacin (LEVAQUIN) 500 MG tablet Take 1 tablet (500 mg total) by mouth daily. 7 tablet 0  . lisinopril-hydrochlorothiazide (PRINZIDE,ZESTORETIC) 20-12.5 MG tablet TAKE 1 TABLET BY MOUTH DAILY 90 tablet 3  . metoprolol tartrate (LOPRESSOR) 25 MG tablet Take 1 tablet (25 mg total) by mouth 2 (two) times daily. 180 tablet 3  . Multiple Vitamin (MULTIVITAMIN) tablet Take 1 tablet by mouth daily. Centrum Silver    . nicotine (NICODERM CQ) 21 mg/24hr patch Place 1 patch (21 mg total) onto the skin daily. 28 patch 3  . omeprazole (PRILOSEC) 20 MG capsule Take 1 capsule (20 mg total) by mouth daily. 90 capsule 3  . predniSONE (DELTASONE) 20 MG tablet 3 tabs poqday 1-2, 2 tabs poqday 3-4, 1 tab poqday 5-6 12 tablet 0  . Spacer/Aero-Holding Chambers DEVI 1 Device by Does not apply route as needed. 1 each 0  . Tiotropium Bromide-Olodaterol (STIOLTO RESPIMAT) 2.5-2.5 MCG/ACT AERS Inhale 2 puffs into the lungs daily. 1 Inhaler 11   No current facility-administered medications on file prior to visit.   No Known Allergies Social History   Social History  . Marital Status: Divorced    Spouse Name: N/A  . Number of Children: 3  . Years of Education: 9   Occupational History  . RetiredAnimal nutritionist    Social History Main Topics  . Smoking status: Former Smoker -- 1.00 packs/day    Types: Cigarettes  . Smokeless tobacco: Never Used  . Alcohol Use: No  . Drug Use: No  . Sexual Activity: Not Currently   Other Topics Concern  . Not on file   Social History Narrative   Grew up in [place] area.    Left school after 9th grade. No father and had to work.    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       Health Care POA:    Emergency Contact: sister, Hector Brunswick (h) [phone]   End of Life Plan:    Who lives with you:  self   Any pets: Lorrin Goodell   Diet: Pt limits  sugars and starches and focuses on vegetables an protein. Pt has lost 50 lbs over several years.   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   Seatbelts: Pt reports wearing seatbelt when in vehicle.   Sun Exposure/Protection: Pt does not use sun protectin.   Hobbies: Racing, TV, gardening, Architect            Review of Systems  All other systems reviewed and are negative.      Objective:   Physical Exam  Constitutional: He is oriented to person, place, and time. He appears well-developed and well-nourished. No distress.  Neck: Neck supple. No JVD present. No thyromegaly present.  Cardiovascular: Normal rate, regular rhythm, normal heart sounds and intact distal pulses.   No murmur heard. Pulmonary/Chest: Effort normal. No respiratory distress. He has decreased breath sounds. He has wheezes. He has no rhonchi. He has no rales.  Abdominal: Soft. Bowel sounds are normal. He exhibits no distension. There is no tenderness. There is no rebound and no guarding.  Musculoskeletal: He exhibits edema.  Lymphadenopathy:    He has no cervical adenopathy.  Neurological: He is alert and oriented to person, place, and time. He has normal reflexes. No cranial nerve deficit. He exhibits normal muscle tone. Coordination normal.  Skin: He is not diaphoretic.  Vitals reviewed.         Assessment & Plan:  COPD exacerbation. Clinically improving. Finish Levaquin and prednisone. Continue Combivent 4 times a day. Continue steel toes daily. Recheck in one week if not 100% better or sooner if worse

## 2015-08-30 ENCOUNTER — Ambulatory Visit (HOSPITAL_COMMUNITY): Payer: Medicare Other | Attending: Cardiology

## 2015-08-30 ENCOUNTER — Other Ambulatory Visit (HOSPITAL_COMMUNITY): Payer: Self-pay

## 2015-08-30 DIAGNOSIS — Z87891 Personal history of nicotine dependence: Secondary | ICD-10-CM | POA: Insufficient documentation

## 2015-08-30 DIAGNOSIS — E785 Hyperlipidemia, unspecified: Secondary | ICD-10-CM | POA: Insufficient documentation

## 2015-08-30 DIAGNOSIS — I110 Hypertensive heart disease with heart failure: Secondary | ICD-10-CM | POA: Diagnosis not present

## 2015-08-30 DIAGNOSIS — Z8249 Family history of ischemic heart disease and other diseases of the circulatory system: Secondary | ICD-10-CM | POA: Diagnosis not present

## 2015-08-30 DIAGNOSIS — I340 Nonrheumatic mitral (valve) insufficiency: Secondary | ICD-10-CM | POA: Diagnosis not present

## 2015-08-30 DIAGNOSIS — R0609 Other forms of dyspnea: Secondary | ICD-10-CM | POA: Diagnosis not present

## 2015-08-30 DIAGNOSIS — I509 Heart failure, unspecified: Secondary | ICD-10-CM | POA: Diagnosis not present

## 2015-08-30 DIAGNOSIS — I77819 Aortic ectasia, unspecified site: Secondary | ICD-10-CM | POA: Diagnosis not present

## 2015-08-30 DIAGNOSIS — R0600 Dyspnea, unspecified: Secondary | ICD-10-CM | POA: Diagnosis present

## 2015-09-05 ENCOUNTER — Other Ambulatory Visit: Payer: Self-pay | Admitting: Family Medicine

## 2015-09-05 DIAGNOSIS — R6889 Other general symptoms and signs: Secondary | ICD-10-CM

## 2015-09-05 DIAGNOSIS — R931 Abnormal findings on diagnostic imaging of heart and coronary circulation: Secondary | ICD-10-CM

## 2015-10-01 ENCOUNTER — Ambulatory Visit (INDEPENDENT_AMBULATORY_CARE_PROVIDER_SITE_OTHER): Payer: Medicare Other | Admitting: Cardiovascular Disease

## 2015-10-01 ENCOUNTER — Encounter (INDEPENDENT_AMBULATORY_CARE_PROVIDER_SITE_OTHER): Payer: Self-pay

## 2015-10-01 ENCOUNTER — Encounter: Payer: Self-pay | Admitting: Cardiovascular Disease

## 2015-10-01 VITALS — BP 110/58 | HR 72 | Ht 72.0 in | Wt 218.0 lb

## 2015-10-01 DIAGNOSIS — E871 Hypo-osmolality and hyponatremia: Secondary | ICD-10-CM

## 2015-10-01 DIAGNOSIS — J42 Unspecified chronic bronchitis: Secondary | ICD-10-CM

## 2015-10-01 DIAGNOSIS — I10 Essential (primary) hypertension: Secondary | ICD-10-CM

## 2015-10-01 DIAGNOSIS — I2510 Atherosclerotic heart disease of native coronary artery without angina pectoris: Secondary | ICD-10-CM

## 2015-10-01 DIAGNOSIS — E782 Mixed hyperlipidemia: Secondary | ICD-10-CM

## 2015-10-01 DIAGNOSIS — I5022 Chronic systolic (congestive) heart failure: Secondary | ICD-10-CM

## 2015-10-01 DIAGNOSIS — R0602 Shortness of breath: Secondary | ICD-10-CM | POA: Diagnosis not present

## 2015-10-01 DIAGNOSIS — Z720 Tobacco use: Secondary | ICD-10-CM

## 2015-10-01 DIAGNOSIS — I493 Ventricular premature depolarization: Secondary | ICD-10-CM

## 2015-10-01 MED ORDER — METOPROLOL TARTRATE 25 MG PO TABS: 37.5000 mg | ORAL_TABLET | Freq: Two times a day (BID) | ORAL | 3 refills | Status: DC

## 2015-10-01 NOTE — Progress Notes (Signed)
Cardiology consultation Note    Date:  10/01/2015   ID:  Bradley Meadows, DOB 1940/04/23, MRN [id number]  PCP:  Odette Fraction, MD  Cardiologist:   Sanda Klein, MD  Consultation requested for: Dyspnea, depressed left ventricular systolic function, PVCs  Chief Complaint  Patient presents with  . New Evaluation    pt c/o sob    History of Present Illness:  Bradley Meadows is a 75 y.o. male with a long-standing history of coronary artery disease. He is referred by Dr. Dennard Schaumann after his echocardiogram showed mildly depressed left ventricular ejection fraction of roughly 45% with diffuse hypokinesis. He also noted extremely frequent PVCs.  The patient describes NYHA functional class II exertional dyspnea, but also has episodes of dyspnea at rest without obvious cause. He often wheezes. He has a chronic cough. He denies any chest discomfort either currently or in the past. He is not troubled by leg edema frequently although this was an issue about a month ago. Fatigue is a prominent complaint.  In March 2008 a nuclear study showed the presence of a lateral wall scar and mildly reduced ejection fraction at 49%. In April 2008 coronary angiography showed left circumflex coronary artery occlusion with filling via collaterals. Revascularization was not performed. Angina was never a significant complaint, but his workup was initiated due to dyspnea. He also has significant problems with COPD. He successfully quit smoking for about 8 years that started smoking again 3-4 years ago. He has chronic wheezing, taking a combination of bronchodilators. He has well treated systemic hypertension in his medications include an ACE inhibitor and a beta blocker. He takes a diuretic on an occasional basis for swelling. He is receiving a statin for hyperlipidemia and his LDL cholesterol level is excellent. His HDL cholesterol is borderline low. He does not describe excessive hypersomnolence, and only scores 4 points on  the Epworth Sleepiness Scale. He is borderline obese with a BMI just under 30.  He is unaware of palpitations and has not experienced syncope. He denies claudication or focal neurological complaints.  Past Medical History:  Diagnosis Date  . CHF (congestive heart failure) ([facility])    ef=45-50%  . COPD (chronic obstructive pulmonary disease) ([facility])   . GERD (gastroesophageal reflux disease)    ulcers  . Hyperlipidemia   . Hypertension     Past Surgical History:  Procedure Laterality Date  . EYE SURGERY     cataracts  . JOINT REPLACEMENT     knee both    Current Medications: Outpatient Medications Prior to Visit  Medication Sig Dispense Refill  . albuterol-ipratropium (COMBIVENT) 18-103 MCG/ACT inhaler Inhale 2 puffs into the lungs every 6 (six) hours as needed for wheezing or shortness of breath.    [name] aspirin 81 MG tablet Take 1 tablet (81 mg total) by mouth daily. 1 tablet 3  . atorvastatin (LIPITOR) 40 MG tablet TAKE 1 TABLET(40 MG) BY MOUTH DAILY 90 tablet 0  . COMBIVENT RESPIMAT 20-100 MCG/ACT AERS respimat INHALE 1 PUFF BY MOUTH EVERY 6 HOURS AS NEEDED FOR WHEEZING 4 g 5  . ferrous sulfate 325 (65 FE) MG tablet TAKE 1 TABLET BY MOUTH EVERY DAY WITH BREAKFAST 90 tablet 3  . furosemide (LASIX) 40 MG tablet TAKE 1 TABLET BY MOUTH ONCE DAILY as needed for swelling 90 tablet 0  . HYDROcodone-acetaminophen (NORCO/VICODIN) 5-325 MG tablet TK 1 T PO  BID. MUST LAST 30 DAYS 60 tablet 0  . levofloxacin (LEVAQUIN) 500 MG tablet Take 1 tablet (  500 mg total) by mouth daily. 7 tablet 0  . lisinopril-hydrochlorothiazide (PRINZIDE,ZESTORETIC) 20-12.5 MG tablet TAKE 1 TABLET BY MOUTH DAILY 90 tablet 3  . Multiple Vitamin (MULTIVITAMIN) tablet Take 1 tablet by mouth daily. Centrum Silver    . nicotine (NICODERM CQ) 21 mg/24hr patch Place 1 patch (21 mg total) onto the skin daily. 28 patch 3  . omeprazole (PRILOSEC) 20 MG capsule Take 1 capsule (20 mg total) by mouth daily. 90 capsule 3  .  predniSONE (DELTASONE) 20 MG tablet 3 tabs poqday 1-2, 2 tabs poqday 3-4, 1 tab poqday 5-6 12 tablet 0  . Spacer/Aero-Holding Chambers DEVI 1 Device by Does not apply route as needed. 1 each 0  . Tiotropium Bromide-Olodaterol (STIOLTO RESPIMAT) 2.5-2.5 MCG/ACT AERS Inhale 2 puffs into the lungs daily. 1 Inhaler 11  . metoprolol tartrate (LOPRESSOR) 25 MG tablet Take 1 tablet (25 mg total) by mouth 2 (two) times daily. 180 tablet 3   No facility-administered medications prior to visit.      Allergies:   Review of patient's allergies indicates no known allergies.   Social History   Social History  . Marital status: Divorced    Spouse name: N/A  . Number of children: 3  . Years of education: 9   Occupational History  . RetiredAnimal nutritionist Retired   Social History Main Topics  . Smoking status: Former Smoker    Packs/day: 1.00    Types: Cigarettes    Quit date: 09/29/2015  . Smokeless tobacco: Never Used  . Alcohol use No  . Drug use: No  . Sexual activity: Not Currently   Other Topics Concern  . None   Social History Narrative   Grew up in [place] area.    Left school after 9th grade. No father and had to work.    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       Health Care POA:    Emergency Contact: sister, Hector Brunswick (h) [phone]   End of Life Plan:    Who lives with you: self   Any pets: Lorrin Goodell   Diet: Pt limits sugars and starches and focuses on vegetables an protein. Pt has lost 50 lbs over several years.   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   Seatbelts: Pt reports wearing seatbelt when in vehicle.   Sun Exposure/Protection: Pt does not use sun protectin.   Hobbies: Racing, TV, gardening, Architect           Family History:  The patient's family history includes Alcohol abuse in his father; Arthritis in his mother; COPD in his mother; Cancer in his sister; Heart disease in  his paternal grandfather; Hyperlipidemia in his sister.   ROS:   Please see the history of present illness.    ROS All other systems reviewed and are negative.   PHYSICAL EXAM:   VS:  BP (!) 110/58 (BP Location: Right Arm, Patient Position: Sitting, Cuff Size: Normal)   Pulse 72   Ht 6' (1.829 m)   Wt 218 lb (98.9 kg)   SpO2 97%   BMI 29.57 kg/m    GEN: Well nourished, well developed, in no acute distress  HEENT: normal  Neck: no JVD, carotid bruits, or masses Cardiac: Frequent ectopic beats in a background of RRR; no murmurs, rubs, or gallops,no edema  Respiratory:  Emphysematous chest, decreased breath sounds throughout but without wheezing, otherwise clear to auscultation bilaterally, normal work of breathing  GI: soft, nontender, nondistended, + BS MS: no deformity or atrophy  Skin: warm and dry, no rash Neuro:  Alert and Oriented x 3, Strength and sensation are intact Psych: euthymic mood, full affect  Wt Readings from Last 3 Encounters:  10/01/15 218 lb (98.9 kg)  08/16/15 223 lb (101.2 kg)  08/14/15 220 lb (99.8 kg)      Studies/Labs Reviewed:   EKG:  EKG is ordered today.  The ekg ordered today demonstrates Sinus rhythm with frequent PVCs and occasional ventricular couplets, incomplete right bundle branch block, no ischemic repolarization abnormalities, QTC 442 ms  Recent Labs: 10/06/2014: TSH 1.302 08/14/2015: ALT 12; Brain Natriuretic Peptide 33.0; BUN 8; Creat 0.81; Hemoglobin 13.6; Platelets 205; Potassium 4.1; Sodium 127   Lipid Panel    Component Value Date/Time   CHOL 114 (L) 04/25/2015 0825   TRIG 92 04/25/2015 0825   HDL 35 (L) 04/25/2015 0825   CHOLHDL 3.3 04/25/2015 0825   VLDL 18 04/25/2015 0825   LDLCALC 61 04/25/2015 0825   LDLDIRECT 82 05/26/2012 1141     ASSESSMENT:    1. Coronary artery disease involving native coronary artery of native heart without angina pectoris   2. Chronic bronchitis, unspecified chronic bronchitis type ([place] [place])   3.  Chronic systolic heart failure ([place])   4. Frequent PVCs   5. Mixed hyperlipidemia   6. Essential hypertension, benign   7. Tobacco abuse   8. Chronic hyponatremia   9. Shortness of breath      PLAN:  In order of problems listed above:  1. CAD: Mr. Gotay does not have angina pectoris but was never really aware of angina in the past either. He has mildly depressed left ventricular systolic function due to scar in the left ventricular lateral wall, in turn secondary to chronic total occlusion of the left circumflex coronary artery. I think it is worthwhile repeating a nuclear scan. If he continues to have scar without ischemia in the lateral wall and no other abnormalities, I think it can be safe to assume that there has not been significant progression of his coronary issues and we can avoid invasive evaluation. If substantial areas of myocardial ischemia are identified he should undergo repeat coronary angiography 2. COPD: Since his symptoms of shortness of breath often occur at rest without activity, I think bronchospasm is a more likely cause of his shortness of breath than heart failure. 3. CHF: She does have depressed left ventricular systolic function and has shown fairly significant swings in weight consistent with episodes of acute heart failure exacerbation. However, at this point in time I think he appears to be euvolemic/well compensated. He is on appropriate treatment with ace inhibitors and beta blockers. In July, his BNP was quite low and his chest x-ray did not show CHF. It should also be noted that he was markedly hyponatremic in July, suggesting that he may have been dehydrated/overdiuresis, but with normal BUN/creatinine levels. I Travious't think he requires additional diuresis. 4. PVCs: I'm not sure that these are actual contributors to his complaints, although this is a possibility. Unfortunately, we have completed taking needs from a lung and heart point of view. We'll try to slightly  increase the dose of beta blocker, but obviously this could increase obstructive lung problems. In turn his frequent PVCs might be related to use of bronchodilator sympathetic agonist agents such as albuterol and especially long acting Olodaterol in [employer]. Reevaluate symptomatic response in one month. 5. HLP: Excellent LDL cholesterol level 6. HTN:  Excellent blood pressure control. 7. Smoking cessation: Overall, my initial impression is that Mr. Biggins has dyspnea primarily due to COPD rather than heart failure. Smoking cessation is a #1 priority. He tells me that he smoked his last cigarette 3 days ago but is having some symptoms of nicotine withdrawal. The patches "didn't really work for him". 8. Hyponatremia: Appears to be a chronic abnormality, dating back as far as 2008. Hopefully not a sign of paraneoplastic SIADH.  Medication Adjustments/Labs and Tests Ordered: Current medicines are reviewed at length with the patient today.  Concerns regarding medicines are outlined above.  Medication changes, Labs and Tests ordered today are listed in the Patient Instructions below. Patient Instructions  Medication Instructions: Dr Sallyanne Kuster has recommended making the following medication changes: 1. INCREASE Metoprolol to 37.5 mg (1.5 tablets total) twice daily  Labwork: NONE ORDERED  Testing/Procedures: 1. [place] Stress test - Your physician has requested that you have a lexiscan myoview. For further information please visit [email or web address]. Please follow instruction sheet, as given.  Follow-up: Dr Sallyanne Kuster recommends that you schedule a follow-up appointment in 1 month.  If you need a refill on your cardiac medications before your next appointment, please call your pharmacy.   Congratulations on quitting smoking!! Stick with it!!    Signed, Sanda Klein, MD  10/01/2015 4:36 PM    [facility] [address], [place], [place]  [address] Phone: [phone]; Fax: [phone]

## 2015-10-01 NOTE — Patient Instructions (Addendum)
Medication Instructions: Dr Sallyanne Kuster has recommended making the following medication changes: 1. INCREASE Metoprolol to 37.5 mg (1.5 tablets total) twice daily  Labwork: NONE ORDERED  Testing/Procedures: 1. [place] Stress test - Your physician has requested that you have a lexiscan myoview. For further information please visit [email or web address]. Please follow instruction sheet, as given.  Follow-up: Dr Sallyanne Kuster recommends that you schedule a follow-up appointment in 1 month.  If you need a refill on your cardiac medications before your next appointment, please call your pharmacy.   Congratulations on quitting smoking!! Stick with it!!

## 2015-10-06 ENCOUNTER — Other Ambulatory Visit: Payer: Self-pay | Admitting: Family Medicine

## 2015-10-15 ENCOUNTER — Telehealth: Payer: Self-pay | Admitting: Family Medicine

## 2015-10-15 NOTE — Telephone Encounter (Signed)
XY#[phone]  Patient calling requesting a refill on his hydrocodone

## 2015-10-15 NOTE — Telephone Encounter (Signed)
Ok to refill 

## 2015-10-16 NOTE — Telephone Encounter (Signed)
ok 

## 2015-10-18 ENCOUNTER — Telehealth (HOSPITAL_COMMUNITY): Payer: Self-pay

## 2015-10-18 MED ORDER — HYDROCODONE-ACETAMINOPHEN 5-325 MG PO TABS: ORAL_TABLET | ORAL | 0 refills | Status: DC

## 2015-10-18 NOTE — Telephone Encounter (Signed)
RX printed, left up front and patient aware to pick up  

## 2015-10-18 NOTE — Telephone Encounter (Signed)
Encounter complete. 

## 2015-10-23 ENCOUNTER — Inpatient Hospital Stay (HOSPITAL_COMMUNITY): Admission: RE | Admit: 2015-10-23 | Payer: Medicare Other | Source: Ambulatory Visit

## 2015-11-05 ENCOUNTER — Ambulatory Visit: Payer: Medicare Other | Admitting: Cardiovascular Disease

## 2015-11-08 ENCOUNTER — Telehealth (HOSPITAL_COMMUNITY): Payer: Self-pay | Admitting: Cardiovascular Disease

## 2015-11-08 NOTE — Telephone Encounter (Signed)
Pt has been contacted a few times(9/19,10/2,10/5) to get his Myoview rescheduled and have yet to receive a call back. Dr. Victorino December nurse has been notified and she will be removed from the [facility]. If in fact the patient does call back the test will be reordered by the ordering physician.

## 2015-11-20 ENCOUNTER — Telehealth: Payer: Self-pay | Admitting: Family Medicine

## 2015-11-20 NOTE — Telephone Encounter (Signed)
ok 

## 2015-11-20 NOTE — Telephone Encounter (Signed)
Ok to refill 

## 2015-11-20 NOTE — Telephone Encounter (Signed)
Patient is calling to get rx for his hydrocodone  [phone]

## 2015-11-21 MED ORDER — HYDROCODONE-ACETAMINOPHEN 5-325 MG PO TABS: ORAL_TABLET | ORAL | 0 refills | Status: DC

## 2015-11-21 NOTE — Telephone Encounter (Signed)
RX printed, left up front and patient aware to pick up  

## 2015-12-12 ENCOUNTER — Other Ambulatory Visit: Payer: Self-pay | Admitting: Family Medicine

## 2015-12-12 DIAGNOSIS — Z8711 Personal history of peptic ulcer disease: Secondary | ICD-10-CM

## 2015-12-26 ENCOUNTER — Encounter: Payer: Self-pay | Admitting: Family Medicine

## 2015-12-26 ENCOUNTER — Ambulatory Visit: Payer: Medicare Other | Admitting: Family Medicine

## 2015-12-26 ENCOUNTER — Other Ambulatory Visit: Payer: Self-pay | Admitting: Family Medicine

## 2015-12-26 ENCOUNTER — Ambulatory Visit (INDEPENDENT_AMBULATORY_CARE_PROVIDER_SITE_OTHER): Payer: Medicare Other | Admitting: Family Medicine

## 2015-12-26 VITALS — BP 110/52 | HR 80 | Temp 98.8°F | Resp 22 | Wt 206.0 lb

## 2015-12-26 DIAGNOSIS — J449 Chronic obstructive pulmonary disease, unspecified: Secondary | ICD-10-CM | POA: Diagnosis not present

## 2015-12-26 DIAGNOSIS — I2510 Atherosclerotic heart disease of native coronary artery without angina pectoris: Secondary | ICD-10-CM | POA: Diagnosis not present

## 2015-12-26 DIAGNOSIS — R0609 Other forms of dyspnea: Secondary | ICD-10-CM

## 2015-12-26 DIAGNOSIS — I5022 Chronic systolic (congestive) heart failure: Secondary | ICD-10-CM

## 2015-12-26 DIAGNOSIS — J4489 Other specified chronic obstructive pulmonary disease: Secondary | ICD-10-CM

## 2015-12-26 MED ORDER — HYDROCODONE-ACETAMINOPHEN 5-325 MG PO TABS: ORAL_TABLET | ORAL | 0 refills | Status: DC

## 2015-12-26 NOTE — Progress Notes (Signed)
Subjective:    Patient ID: Bradley Meadows, male    DOB: [birth year], 75 y.o.   MRN: [id number]  HPI9/1/16 Patient is a 75 year old white male who is here today to establish care. Past medical history is significant for coronary artery disease. Patient states he had a heart attack in the remote past. He denies any stents. He is currently taking an aspirin 81 mg by mouth daily. He also has a long-standing history of COPD. He continues to smoke. He is currently using Combivent 4 times a day. He reports dyspnea on exertion as well as significant fatigue.[name] He has no energy. He also has a history of hypertension as well as hyperlipidemia. He is overdue for fasting lipid panel. Given his history of coronary artery disease his goal LDL cholesterol is less than 70. His blood pressures well controlled today 110/60. He is overdue for colonoscopy as the patient states he has never had one. He is also due for an annual prostate exam.. Patient has had Pneumovax 23. He has had Zostavax. He has had a tetanus shot. He is due for a flu shot today as well as Prevnar 13.    At that time, my plan was: Patient received a flu shot today as well as Prevnar 13. Patient defers the colonoscopy at the present time. He would like to do the prostate exam at his next visit. His blood pressure is well controlled today. Given his history of coronary artery disease, his goal LDL cholesterol is less than 70. He is currently taking an ACE inhibitor, beta blocker, as well as a 81 mg aspirin on a daily basis. I encouraged smoking cessation and to that end, the patient asked me to send a prescription for NicoDerm patches to his pharmacy. I gladly did that. He has a history of congestive heart failure with an ejection fraction of 45-50%. He is on a beta blocker as well as an ACE inhibitor. He uses Lasix for swelling. His ejection fraction is not low enough yet to warrant spironolactone. Again I encouraged smoking cessation and sodium restriction. I  would like the patient to return fasting tomorrow morning for a CBC, CMP, fasting lipid panel, PSA. I will try to optimize his management of COPD to see if this would improve his dyspnea. I recommended he use Combivent only as needed and begin stiolto 2 inh qday.  Recheck in 2 weeks  10/19/14 Patient's lab work was excellent. His breathing improved significantly on stiolto.  Furthermore he has decreased his smoking to 2 or 3 cigarettes per day. I congratulated the patient on his efforts and encouraged him to continue to work towards quitting smoking. He is only using Combivent at night. His immunizations are up-to-date. He was unable to tolerate compression stockings. He would like to continue Lasix 40 mg a day as needed for leg swelling. He also complains of cerumen impaction in both years. Sure enough on examination he has bilateral cerumen impactions.  At that time, my plan was: Continue Lasix 40 mg by mouth daily for leg swelling. Patient can use this medication on a when necessary basis. Continue stiolto 2 inhalations daily. I congratulated the patient on his smoking cessation efforts. Recheck lab work in 6 months. Cerumen impaction was removed bilaterally using irrigation/lavage. Patient will be due for a prostate exam at his follow-up in 6 months  04/24/15 Patient is here today for follow-up. He denies any chest pain. He denies any angina. He denies any orthopnea or paroxysmal nocturnal dyspnea. He  has discontinued Lasix. He does not have any substantial swelling in his legs. There is no JVD today on exam. He continues to use stiolto once daily and uses Combivent only at night. This manages his breathing relatively well. He recently tried to quit smoking and so far has been successful. He is overdue for fasting lab work. He is on a combination of a beta blocker as well as an ACE inhibitor for his history of congestive heart failure with an ejection fraction of 45-50%. He is also on atorvastatin given his  history of ASCVD with a goal LDL cholesterol less than 70.  At that time, my plan was: I congratulated the patient on his smoking cessation. I encouraged him to keep up the good work. His blood pressure today is excellent and requires no further medication. He is on appropriate medications for his congestive heart failure including a beta blocker as well as an ACE inhibitor. At the present time I did do not see a reason to switch the patient to entresto.  Currently his COPD is stable. He has not had any recent exacerbations. I will also check a fasting lipid panel. His goal LDL cholesterol is less than 70. He is also asking if I will prescribe his pain medication for him. He takes hydrocodone/acetaminophen 5/325 one by mouth twice a day for chronic pain in his shoulders and arthritic pains in his hands and knees. It will be easier for him to come to this office to pick up prescription rather than going all the way to his orthopedist office. I stated that I would be willing to do this. I will prescribe him 60 pills every month  08/14/15 Patient reports 2 weeks of worsening dyspnea on exertion. He is still taking stiolto daily and combivent bid.  However he sees very little benefit from his breathing medicines. He is smoking again. He is audibly wheezing today on exam. He has markedly diminished breath sounds bilaterally with rhonchorous breath sounds and expiratory wheezing. Heart was in normal sinus rhythm with frequent PVCs. His weight is down approximately 12 pounds since his last office visit. He does have trace pretibial edema bilaterally but not severe. There is no evidence of JVD or overt failure. Past medical history as documented above is significant for congestive heart failure but the patient does not appear fluid overloaded today on examination. He denies any chest pain or angina.  My plan was: Patient seems to be suffering from a COPD exacerbation. I will give the patient 60 mg of Depo-Medrol IM 1  now. Patient was also given DuoNeb 3 mg of albuterol and 0.5 mg of Atrovent 1 now. I want him to go immediately for a chest x-ray. I will obtain a CBC to evaluate for anemia. Given his weight loss and he also reports trace hemoptysis, I want him to go get a chest x-ray immediately. I will also repeat an echocardiogram of his heart. Ejection fraction was 45% 2 years ago but I will evaluate for worsening of his systolic function. Begin prednisone 60 mg a day in addition to Levaquin 500 mg a day and recheck in 48 hours or sooner if worse.  Combivent 2 puff QID.  Quit Smoking!!!!  08/16/15 Patient is doing substantially better. He denies any fever or chest pain. His cough has improved. He is coughing up less mucus. His wheezing has improved. Chest x-ray revealed chronic bronchitic changes but no evidence of pneumonia or CHF. Lab work revealed a normal BNP and no evidence  of anemia.  At that time, my plan was:  Clinically improving. Finish Levaquin and prednisone. Continue Combivent 4 times a day. Continue stiolto daily. Recheck in one week if not 100% better or sooner if worse  12/26/15 Since I last saw the patient, he has had an echocardiogram which revealed an ejection fraction of 45-50%. However it also showed diffuse hypokinesis. He never followed up with cardiology as planned for the stress test. He states that he is having worsening shortness of breath, worsening dyspnea on exertion. He quit taking stiolto.  He is currently using Combivent 4 times a day with no benefit. He is 93% on room air. With ambulation, his oxygen drops to 88% quickly. On 2 L via nasal cannula, his oxygen increased to 98% on room air.  Past Medical History:  Diagnosis Date  . CHF (congestive heart failure) (HCC)    ef=45-50%  . COPD (chronic obstructive pulmonary disease) ([place])   . GERD (gastroesophageal reflux disease)    ulcers  . Hyperlipidemia   . Hypertension    Past Surgical History:  Procedure Laterality Date  .  EYE SURGERY     cataracts  . JOINT REPLACEMENT     knee both   Current Outpatient Prescriptions on File Prior to Visit  Medication Sig Dispense Refill  . albuterol-ipratropium (COMBIVENT) 18-103 MCG/ACT inhaler Inhale 2 puffs into the lungs every 6 (six) hours as needed for wheezing or shortness of breath.    [name] aspirin 81 MG tablet Take 1 tablet (81 mg total) by mouth daily. 1 tablet 3  . atorvastatin (LIPITOR) 40 MG tablet TAKE 1 TABLET(40 MG) BY MOUTH DAILY 90 tablet 1  . COMBIVENT RESPIMAT 20-100 MCG/ACT AERS respimat INHALE 1 PUFF BY MOUTH EVERY 6 HOURS AS NEEDED FOR WHEEZING 4 g 3  . ferrous sulfate 325 (65 FE) MG tablet TAKE 1 TABLET BY MOUTH EVERY DAY WITH BREAKFAST 90 tablet 3  . furosemide (LASIX) 40 MG tablet TAKE 1 TABLET BY MOUTH ONCE DAILY as needed for swelling 90 tablet 0  . lisinopril-hydrochlorothiazide (PRINZIDE,ZESTORETIC) 20-12.5 MG tablet TAKE 1 TABLET BY MOUTH DAILY 90 tablet 3  . metoprolol tartrate (LOPRESSOR) 25 MG tablet Take 1.5 tablets (37.5 mg total) by mouth 2 (two) times daily. 270 tablet 3  . Multiple Vitamin (MULTIVITAMIN) tablet Take 1 tablet by mouth daily. Centrum Silver    . omeprazole (PRILOSEC) 20 MG capsule TAKE ONE CAPSULE BY MOUTH EVERY DAY 90 capsule 3  . Spacer/Aero-Holding Chambers DEVI 1 Device by Does not apply route as needed. 1 each 0  . Tiotropium Bromide-Olodaterol (STIOLTO RESPIMAT) 2.5-2.5 MCG/ACT AERS Inhale 2 puffs into the lungs daily. 1 Inhaler 11   No current facility-administered medications on file prior to visit.    No Known Allergies Social History   Social History  . Marital status: Divorced    Spouse name: N/A  . Number of children: 3  . Years of education: 9   Occupational History  . RetiredAnimal nutritionist Retired   Social History Main Topics  . Smoking status: Former Smoker    Packs/day: 1.00    Types: Cigarettes    Quit date: 09/29/2015  . Smokeless tobacco: Never Used  . Alcohol use No  . Drug use: No  .  Sexual activity: Not Currently   Other Topics Concern  . Not on file   Social History Narrative   Grew up in [place] area.    Left school after 9th grade. No father and had to work.  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       Health Care POA:    Emergency Contact: sister, Hector Brunswick (h) [phone]   End of Life Plan:    Who lives with you: self   Any pets: Lorrin Goodell   Diet: Pt limits sugars and starches and focuses on vegetables an protein. Pt has lost 50 lbs over several years.   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   Seatbelts: Pt reports wearing seatbelt when in vehicle.   Sun Exposure/Protection: Pt does not use sun protectin.   Hobbies: Racing, TV, gardening, Architect            Review of Systems  All other systems reviewed and are negative.      Objective:   Physical Exam  Constitutional: He is oriented to person, place, and time. He appears well-developed and well-nourished. No distress.  Neck: Neck supple. No JVD present. No thyromegaly present.  Cardiovascular: Normal rate, regular rhythm, normal heart sounds and intact distal pulses.   No murmur heard. Pulmonary/Chest: Effort normal. No respiratory distress. He has decreased breath sounds. He has wheezes. He has no rhonchi. He has no rales.  Abdominal: Soft. Bowel sounds are normal. He exhibits no distension. There is no tenderness. There is no rebound and no guarding.  Musculoskeletal: He exhibits edema.  Lymphadenopathy:    He has no cervical adenopathy.  Neurological: He is alert and oriented to person, place, and time. He has normal reflexes. No cranial nerve deficit. He exhibits normal muscle tone. Coordination normal.  Skin: He is not diaphoretic.  Vitals reviewed.         Assessment & Plan:  COPD (chronic obstructive pulmonary disease) with chronic bronchitis (HCC)  Dyspnea on exertion - Plan: Ambulatory  referral to Cardiology  Chronic systolic heart failure ([place]) - Plan: Ambulatory referral to Cardiology  ASCVD (arteriosclerotic cardiovascular disease) - Plan: Ambulatory referral to Cardiology  Patient's dyspnea is multifactorial. The majority is due to COPD. The patient recently quit smoking. I instructed him to resume stiolto 2 inhalations daily. Begin DuoNeb nebs 2.5/0.5 mg nebs every 6 hours when necessary. I will arrange for the patient to have oxygen at home 2 L via nasal cannula. I will try to schedule follow-up with cardiologist for that the patient can have a stress test to evaluate for possible causes of his global hypokinesis on echocardiogram. Recheck next week. Start the patient on prednisone 40 mg daily given the severity of his COPD. Will discontinue prednisone in one week if his breathing is improving

## 2015-12-26 NOTE — Addendum Note (Signed)
Addended by: Shary Decamp B on: 12/26/2015 04:04 PM   Modules accepted: Orders

## 2016-01-01 ENCOUNTER — Ambulatory Visit (INDEPENDENT_AMBULATORY_CARE_PROVIDER_SITE_OTHER): Payer: Medicare Other | Admitting: Family Medicine

## 2016-01-01 ENCOUNTER — Ambulatory Visit: Payer: Medicare Other | Admitting: Family Medicine

## 2016-01-01 VITALS — BP 118/60 | HR 60 | Temp 98.0°F | Resp 20 | Ht 72.0 in | Wt 201.0 lb

## 2016-01-01 DIAGNOSIS — J449 Chronic obstructive pulmonary disease, unspecified: Secondary | ICD-10-CM | POA: Diagnosis not present

## 2016-01-01 DIAGNOSIS — I2510 Atherosclerotic heart disease of native coronary artery without angina pectoris: Secondary | ICD-10-CM

## 2016-01-01 DIAGNOSIS — R0609 Other forms of dyspnea: Secondary | ICD-10-CM | POA: Diagnosis not present

## 2016-01-01 NOTE — Progress Notes (Signed)
Subjective:    Patient ID: Bradley Meadows, male    DOB: [birth year], 75 y.o.   MRN: [id number]  HPI9/1/16 Patient is a 75 year old white male who is here today to establish care. Past medical history is significant for coronary artery disease. Patient states he had a heart attack in the remote past. He denies any stents. He is currently taking an aspirin 81 mg by mouth daily. He also has a long-standing history of COPD. He continues to smoke. He is currently using Combivent 4 times a day. He reports dyspnea on exertion as well as significant fatigue.[name] He has no energy. He also has a history of hypertension as well as hyperlipidemia. He is overdue for fasting lipid panel. Given his history of coronary artery disease his goal LDL cholesterol is less than 70. His blood pressures well controlled today 110/60. He is overdue for colonoscopy as the patient states he has never had one. He is also due for an annual prostate exam.. Patient has had Pneumovax 23. He has had Zostavax. He has had a tetanus shot. He is due for a flu shot today as well as Prevnar 13.    At that time, my plan was: Patient received a flu shot today as well as Prevnar 13. Patient defers the colonoscopy at the present time. He would like to do the prostate exam at his next visit. His blood pressure is well controlled today. Given his history of coronary artery disease, his goal LDL cholesterol is less than 70. He is currently taking an ACE inhibitor, beta blocker, as well as a 81 mg aspirin on a daily basis. I encouraged smoking cessation and to that end, the patient asked me to send a prescription for NicoDerm patches to his pharmacy. I gladly did that. He has a history of congestive heart failure with an ejection fraction of 45-50%. He is on a beta blocker as well as an ACE inhibitor. He uses Lasix for swelling. His ejection fraction is not low enough yet to warrant spironolactone. Again I encouraged smoking cessation and sodium restriction. I  would like the patient to return fasting tomorrow morning for a CBC, CMP, fasting lipid panel, PSA. I will try to optimize his management of COPD to see if this would improve his dyspnea. I recommended he use Combivent only as needed and begin stiolto 2 inh qday.  Recheck in 2 weeks  10/19/14 Patient's lab work was excellent. His breathing improved significantly on stiolto.  Furthermore he has decreased his smoking to 2 or 3 cigarettes per day. I congratulated the patient on his efforts and encouraged him to continue to work towards quitting smoking. He is only using Combivent at night. His immunizations are up-to-date. He was unable to tolerate compression stockings. He would like to continue Lasix 40 mg a day as needed for leg swelling. He also complains of cerumen impaction in both years. Sure enough on examination he has bilateral cerumen impactions.  At that time, my plan was: Continue Lasix 40 mg by mouth daily for leg swelling. Patient can use this medication on a when necessary basis. Continue stiolto 2 inhalations daily. I congratulated the patient on his smoking cessation efforts. Recheck lab work in 6 months. Cerumen impaction was removed bilaterally using irrigation/lavage. Patient will be due for a prostate exam at his follow-up in 6 months  04/24/15 Patient is here today for follow-up. He denies any chest pain. He denies any angina. He denies any orthopnea or paroxysmal nocturnal dyspnea. He  has discontinued Lasix. He does not have any substantial swelling in his legs. There is no JVD today on exam. He continues to use stiolto once daily and uses Combivent only at night. This manages his breathing relatively well. He recently tried to quit smoking and so far has been successful. He is overdue for fasting lab work. He is on a combination of a beta blocker as well as an ACE inhibitor for his history of congestive heart failure with an ejection fraction of 45-50%. He is also on atorvastatin given his  history of ASCVD with a goal LDL cholesterol less than 70.  At that time, my plan was: I congratulated the patient on his smoking cessation. I encouraged him to keep up the good work. His blood pressure today is excellent and requires no further medication. He is on appropriate medications for his congestive heart failure including a beta blocker as well as an ACE inhibitor. At the present time I did do not see a reason to switch the patient to entresto.  Currently his COPD is stable. He has not had any recent exacerbations. I will also check a fasting lipid panel. His goal LDL cholesterol is less than 70. He is also asking if I will prescribe his pain medication for him. He takes hydrocodone/acetaminophen 5/325 one by mouth twice a day for chronic pain in his shoulders and arthritic pains in his hands and knees. It will be easier for him to come to this office to pick up prescription rather than going all the way to his orthopedist office. I stated that I would be willing to do this. I will prescribe him 60 pills every month  08/14/15 Patient reports 2 weeks of worsening dyspnea on exertion. He is still taking stiolto daily and combivent bid.  However he sees very little benefit from his breathing medicines. He is smoking again. He is audibly wheezing today on exam. He has markedly diminished breath sounds bilaterally with rhonchorous breath sounds and expiratory wheezing. Heart was in normal sinus rhythm with frequent PVCs. His weight is down approximately 12 pounds since his last office visit. He does have trace pretibial edema bilaterally but not severe. There is no evidence of JVD or overt failure. Past medical history as documented above is significant for congestive heart failure but the patient does not appear fluid overloaded today on examination. He denies any chest pain or angina.  My plan was: Patient seems to be suffering from a COPD exacerbation. I will give the patient 60 mg of Depo-Medrol IM 1  now. Patient was also given DuoNeb 3 mg of albuterol and 0.5 mg of Atrovent 1 now. I want him to go immediately for a chest x-ray. I will obtain a CBC to evaluate for anemia. Given his weight loss and he also reports trace hemoptysis, I want him to go get a chest x-ray immediately. I will also repeat an echocardiogram of his heart. Ejection fraction was 45% 2 years ago but I will evaluate for worsening of his systolic function. Begin prednisone 60 mg a day in addition to Levaquin 500 mg a day and recheck in 48 hours or sooner if worse.  Combivent 2 puff QID.  Quit Smoking!!!!  08/16/15 Patient is doing substantially better. He denies any fever or chest pain. His cough has improved. He is coughing up less mucus. His wheezing has improved. Chest x-ray revealed chronic bronchitic changes but no evidence of pneumonia or CHF. Lab work revealed a normal BNP and no evidence  of anemia.  At that time, my plan was:  Clinically improving. Finish Levaquin and prednisone. Continue Combivent 4 times a day. Continue stiolto daily. Recheck in one week if not 100% better or sooner if worse  12/26/15 Since I last saw the patient, he has had an echocardiogram which revealed an ejection fraction of 45-50%. However it also showed diffuse hypokinesis. He never followed up with cardiology as planned for the stress test. He states that he is having worsening shortness of breath, worsening dyspnea on exertion. He quit taking stiolto.  He is currently using Combivent 4 times a day with no benefit. He is 93% on room air. With ambulation, his oxygen drops to 88% quickly. On 2 L via nasal cannula, his oxygen increased to 98% on room air.  At that time, my plan was:  Patient's dyspnea is multifactorial. The majority is due to COPD. The patient recently quit smoking. I instructed him to resume stiolto 2 inhalations daily. Begin DuoNeb nebs 2.5/0.5 mg nebs every 6 hours when necessary. I will arrange for the patient to have oxygen at home  2 L via nasal cannula. I will try to schedule follow-up with cardiologist for that the patient can have a stress test to evaluate for possible causes of his global hypokinesis on echocardiogram. Recheck next week. Start the patient on prednisone 40 mg daily given the severity of his COPD. Will discontinue prednisone in one week if his breathing is improving  01/01/16 Patient's breathing is noticeably better. He is not wheezing as much as he was last week. His lung sounds are improved. His dyspnea on exertion has improved. He is now resting comfortably in bed at night on oxygen. Overall he feels much better however he feels extremely anxious and jittery on high-dose prednisone  Past Medical History:  Diagnosis Date  . CHF (congestive heart failure) (HCC)    ef=45-50%  . COPD (chronic obstructive pulmonary disease) ([place])   . GERD (gastroesophageal reflux disease)    ulcers  . Hyperlipidemia   . Hypertension    Past Surgical History:  Procedure Laterality Date  . EYE SURGERY     cataracts  . JOINT REPLACEMENT     knee both   Current Outpatient Prescriptions on File Prior to Visit  Medication Sig Dispense Refill  . albuterol-ipratropium (COMBIVENT) 18-103 MCG/ACT inhaler Inhale 2 puffs into the lungs every 6 (six) hours as needed for wheezing or shortness of breath.    [name] aspirin 81 MG tablet Take 1 tablet (81 mg total) by mouth daily. 1 tablet 3  . atorvastatin (LIPITOR) 40 MG tablet TAKE 1 TABLET(40 MG) BY MOUTH DAILY 90 tablet 1  . COMBIVENT RESPIMAT 20-100 MCG/ACT AERS respimat INHALE 1 PUFF BY MOUTH EVERY 6 HOURS AS NEEDED FOR WHEEZING 4 g 3  . ferrous sulfate 325 (65 FE) MG tablet TAKE 1 TABLET BY MOUTH EVERY DAY WITH BREAKFAST 90 tablet 3  . furosemide (LASIX) 40 MG tablet TAKE 1 TABLET BY MOUTH ONCE DAILY as needed for swelling 90 tablet 0  . HYDROcodone-acetaminophen (NORCO/VICODIN) 5-325 MG tablet TK 1 T PO  BID. MUST LAST 30 DAYS 60 tablet 0  . ipratropium-albuterol (DUONEB)  0.5-2.5 (3) MG/3ML SOLN USE 1 VIAL VIA NEBULIZER EVERY 6 HOURS AS NEEDED 270 mL 0  . lisinopril-hydrochlorothiazide (PRINZIDE,ZESTORETIC) 20-12.5 MG tablet TAKE 1 TABLET BY MOUTH DAILY 90 tablet 3  . metoprolol tartrate (LOPRESSOR) 25 MG tablet Take 1.5 tablets (37.5 mg total) by mouth 2 (two) times daily. 270 tablet  3  . Multiple Vitamin (MULTIVITAMIN) tablet Take 1 tablet by mouth daily. Centrum Silver    . omeprazole (PRILOSEC) 20 MG capsule TAKE ONE CAPSULE BY MOUTH EVERY DAY 90 capsule 3  . Spacer/Aero-Holding Chambers DEVI 1 Device by Does not apply route as needed. 1 each 0  . Tiotropium Bromide-Olodaterol (STIOLTO RESPIMAT) 2.5-2.5 MCG/ACT AERS Inhale 2 puffs into the lungs daily. 1 Inhaler 11   No current facility-administered medications on file prior to visit.    No Known Allergies Social History   Social History  . Marital status: Divorced    Spouse name: N/A  . Number of children: 3  . Years of education: 9   Occupational History  . RetiredAnimal nutritionist Retired   Social History Main Topics  . Smoking status: Former Smoker    Packs/day: 1.00    Types: Cigarettes    Quit date: 09/29/2015  . Smokeless tobacco: Never Used  . Alcohol use No  . Drug use: No  . Sexual activity: Not Currently   Other Topics Concern  . Not on file   Social History Narrative   Grew up in [place] area.    Left school after 9th grade. No father and had to work.    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       Health Care POA:    Emergency Contact: sister, Hector Brunswick (h) [phone]   End of Life Plan:    Who lives with you: self   Any pets: Lorrin Goodell   Diet: Pt limits sugars and starches and focuses on vegetables an protein. Pt has lost 50 lbs over several years.   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   Seatbelts: Pt reports wearing seatbelt when in vehicle.   Sun Exposure/Protection: Pt does not use sun  protectin.   Hobbies: Racing, TV, gardening, Architect            Review of Systems  All other systems reviewed and are negative.      Objective:   Physical Exam  Constitutional: He is oriented to person, place, and time. He appears well-developed and well-nourished. No distress.  Neck: Neck supple. No JVD present. No thyromegaly present.  Cardiovascular: Normal rate, regular rhythm, normal heart sounds and intact distal pulses.   No murmur heard. Pulmonary/Chest: Effort normal. No respiratory distress. He has decreased breath sounds. He has wheezes. He has no rhonchi. He has no rales.  Abdominal: Soft. Bowel sounds are normal. He exhibits no distension. There is no tenderness. There is no rebound and no guarding.  Musculoskeletal: He exhibits edema.  Lymphadenopathy:    He has no cervical adenopathy.  Neurological: He is alert and oriented to person, place, and time. He has normal reflexes. No cranial nerve deficit. He exhibits normal muscle tone. Coordination normal.  Skin: He is not diaphoretic.  Vitals reviewed.         Assessment & Plan:  COPD (chronic obstructive pulmonary disease) with chronic bronchitis (HCC)  Dyspnea on exertion On examination, breath sounds have improved noticeably. Wheezing is much better although still present. Patient's respiratory status seems improved. Subjectively he feels better. He is resting better at night since starting oxygen. He denies any chest pain. Discontinue prednisone. Continue Stiolto 2 inhalations daily. Use DuoNeb nebs 2.5/0.5 one inhalation every 6 hours as needed for wheezing. Continue oxygen at night and as needed with activity. Continue to refrain from smoking. Recheck in one month. Follow-up with  cardiology as planned.

## 2016-01-06 ENCOUNTER — Other Ambulatory Visit: Payer: Self-pay | Admitting: Family Medicine

## 2016-01-31 ENCOUNTER — Ambulatory Visit (INDEPENDENT_AMBULATORY_CARE_PROVIDER_SITE_OTHER): Payer: Medicare Other | Admitting: Family Medicine

## 2016-01-31 ENCOUNTER — Encounter: Payer: Self-pay | Admitting: Family Medicine

## 2016-01-31 VITALS — BP 110/60 | HR 68 | Temp 98.8°F | Resp 20 | Ht 72.0 in | Wt 200.0 lb

## 2016-01-31 DIAGNOSIS — R490 Dysphonia: Secondary | ICD-10-CM

## 2016-01-31 DIAGNOSIS — R0609 Other forms of dyspnea: Secondary | ICD-10-CM | POA: Diagnosis not present

## 2016-01-31 DIAGNOSIS — J449 Chronic obstructive pulmonary disease, unspecified: Secondary | ICD-10-CM | POA: Diagnosis not present

## 2016-01-31 DIAGNOSIS — I2510 Atherosclerotic heart disease of native coronary artery without angina pectoris: Secondary | ICD-10-CM

## 2016-01-31 DIAGNOSIS — R042 Hemoptysis: Secondary | ICD-10-CM

## 2016-01-31 MED ORDER — HYDROCODONE-ACETAMINOPHEN 5-325 MG PO TABS: ORAL_TABLET | ORAL | 0 refills | Status: DC

## 2016-01-31 NOTE — Progress Notes (Signed)
Subjective:    Patient ID: Bradley Meadows, male    DOB: 03/16/1940, 75 y.o.   MRN: [id number]  HPI9/1/16 Patient is a 75 year old white male who is here today to establish care. Past medical history is significant for coronary artery disease. Patient states he had a heart attack in the remote past. He denies any stents. He is currently taking an aspirin 81 mg by mouth daily. He also has a long-standing history of COPD. He continues to smoke. He is currently using Combivent 4 times a day. He reports dyspnea on exertion as well as significant fatigue.[name] He has no energy. He also has a history of hypertension as well as hyperlipidemia. He is overdue for fasting lipid panel. Given his history of coronary artery disease his goal LDL cholesterol is less than 70. His blood pressures well controlled today 110/60. He is overdue for colonoscopy as the patient states he has never had one. He is also due for an annual prostate exam.. Patient has had Pneumovax 23. He has had Zostavax. He has had a tetanus shot. He is due for a flu shot today as well as Prevnar 13.    At that time, my plan was: Patient received a flu shot today as well as Prevnar 13. Patient defers the colonoscopy at the present time. He would like to do the prostate exam at his next visit. His blood pressure is well controlled today. Given his history of coronary artery disease, his goal LDL cholesterol is less than 70. He is currently taking an ACE inhibitor, beta blocker, as well as a 81 mg aspirin on a daily basis. I encouraged smoking cessation and to that end, the patient asked me to send a prescription for NicoDerm patches to his pharmacy. I gladly did that. He has a history of congestive heart failure with an ejection fraction of 45-50%. He is on a beta blocker as well as an ACE inhibitor. He uses Lasix for swelling. His ejection fraction is not low enough yet to warrant spironolactone. Again I encouraged smoking cessation and sodium restriction. I  would like the patient to return fasting tomorrow morning for a CBC, CMP, fasting lipid panel, PSA. I will try to optimize his management of COPD to see if this would improve his dyspnea. I recommended he use Combivent only as needed and begin stiolto 2 inh qday.  Recheck in 2 weeks  10/19/14 Patient's lab work was excellent. His breathing improved significantly on stiolto.  Furthermore he has decreased his smoking to 2 or 3 cigarettes per day. I congratulated the patient on his efforts and encouraged him to continue to work towards quitting smoking. He is only using Combivent at night. His immunizations are up-to-date. He was unable to tolerate compression stockings. He would like to continue Lasix 40 mg a day as needed for leg swelling. He also complains of cerumen impaction in both years. Sure enough on examination he has bilateral cerumen impactions.  At that time, my plan was: Continue Lasix 40 mg by mouth daily for leg swelling. Patient can use this medication on a when necessary basis. Continue stiolto 2 inhalations daily. I congratulated the patient on his smoking cessation efforts. Recheck lab work in 6 months. Cerumen impaction was removed bilaterally using irrigation/lavage. Patient will be due for a prostate exam at his follow-up in 6 months  04/24/15 Patient is here today for follow-up. He denies any chest pain. He denies any angina. He denies any orthopnea or paroxysmal nocturnal dyspnea. He  has discontinued Lasix. He does not have any substantial swelling in his legs. There is no JVD today on exam. He continues to use stiolto once daily and uses Combivent only at night. This manages his breathing relatively well. He recently tried to quit smoking and so far has been successful. He is overdue for fasting lab work. He is on a combination of a beta blocker as well as an ACE inhibitor for his history of congestive heart failure with an ejection fraction of 45-50%. He is also on atorvastatin given his  history of ASCVD with a goal LDL cholesterol less than 70.  At that time, my plan was: I congratulated the patient on his smoking cessation. I encouraged him to keep up the good work. His blood pressure today is excellent and requires no further medication. He is on appropriate medications for his congestive heart failure including a beta blocker as well as an ACE inhibitor. At the present time I did do not see a reason to switch the patient to entresto.  Currently his COPD is stable. He has not had any recent exacerbations. I will also check a fasting lipid panel. His goal LDL cholesterol is less than 70. He is also asking if I will prescribe his pain medication for him. He takes hydrocodone/acetaminophen 5/325 one by mouth twice a day for chronic pain in his shoulders and arthritic pains in his hands and knees. It will be easier for him to come to this office to pick up prescription rather than going all the way to his orthopedist office. I stated that I would be willing to do this. I will prescribe him 60 pills every month  08/14/15 Patient reports 2 weeks of worsening dyspnea on exertion. He is still taking stiolto daily and combivent bid.  However he sees very little benefit from his breathing medicines. He is smoking again. He is audibly wheezing today on exam. He has markedly diminished breath sounds bilaterally with rhonchorous breath sounds and expiratory wheezing. Heart was in normal sinus rhythm with frequent PVCs. His weight is down approximately 12 pounds since his last office visit. He does have trace pretibial edema bilaterally but not severe. There is no evidence of JVD or overt failure. Past medical history as documented above is significant for congestive heart failure but the patient does not appear fluid overloaded today on examination. He denies any chest pain or angina.  My plan was: Patient seems to be suffering from a COPD exacerbation. I will give the patient 60 mg of Depo-Medrol IM 1  now. Patient was also given DuoNeb 3 mg of albuterol and 0.5 mg of Atrovent 1 now. I want him to go immediately for a chest x-ray. I will obtain a CBC to evaluate for anemia. Given his weight loss and he also reports trace hemoptysis, I want him to go get a chest x-ray immediately. I will also repeat an echocardiogram of his heart. Ejection fraction was 45% 2 years ago but I will evaluate for worsening of his systolic function. Begin prednisone 60 mg a day in addition to Levaquin 500 mg a day and recheck in 48 hours or sooner if worse.  Combivent 2 puff QID.  Quit Smoking!!!!  08/16/15 Patient is doing substantially better. He denies any fever or chest pain. His cough has improved. He is coughing up less mucus. His wheezing has improved. Chest x-ray revealed chronic bronchitic changes but no evidence of pneumonia or CHF. Lab work revealed a normal BNP and no evidence  of anemia.  At that time, my plan was:  Clinically improving. Finish Levaquin and prednisone. Continue Combivent 4 times a day. Continue stiolto daily. Recheck in one week if not 100% better or sooner if worse  12/26/15 Since I last saw the patient, he has had an echocardiogram which revealed an ejection fraction of 45-50%. However it also showed diffuse hypokinesis. He never followed up with cardiology as planned for the stress test. He states that he is having worsening shortness of breath, worsening dyspnea on exertion. He quit taking stiolto.  He is currently using Combivent 4 times a day with no benefit. He is 93% on room air. With ambulation, his oxygen drops to 88% quickly. On 2 L via nasal cannula, his oxygen increased to 98% on room air.  At that time, my plan was:  Patient's dyspnea is multifactorial. The majority is due to COPD. The patient recently quit smoking. I instructed him to resume stiolto 2 inhalations daily. Begin DuoNeb nebs 2.5/0.5 mg nebs every 6 hours when necessary. I will arrange for the patient to have oxygen at home  2 L via nasal cannula. I will try to schedule follow-up with cardiologist for that the patient can have a stress test to evaluate for possible causes of his global hypokinesis on echocardiogram. Recheck next week. Start the patient on prednisone 40 mg daily given the severity of his COPD. Will discontinue prednisone in one week if his breathing is improving  01/01/16 Patient's breathing is noticeably better. He is not wheezing as much as he was last week. His lung sounds are improved. His dyspnea on exertion has improved. He is now resting comfortably in bed at night on oxygen. Overall he feels much better however he feels extremely anxious and jittery on high-dose prednisone.  AT that time, my plan was: On examination, breath sounds have improved noticeably. Wheezing is much better although still present. Patient's respiratory status seems improved. Subjectively he feels better. He is resting better at night since starting oxygen. He denies any chest pain. Discontinue prednisone. Continue Stiolto 2 inhalations daily. Use DuoNeb nebs 2.5/0.5 one inhalation every 6 hours as needed for wheezing. Continue oxygen at night and as needed with activity. Continue to refrain from smoking. Recheck in one month. Follow-up with cardiology as planned.  01/31/16 Patient is scheduled to see the heart doctor on the 12th. He continues to report dyspnea on exertion. He is audibly wheezing today in the exam room. He is wearing oxygen at night but not during the day. He is not wearing oxygen with activity. He is still being compliant with stiolto.  He uses Combivent or DuoNeb nebs every 6 hours as needed. Unfortunately, he reports hemoptysis. This has been occurring over the last 6 weeks. There is trace hemoptysis in his sputum. He also reports worsening hoarseness. After 20 seconds, his voice will simply "go out". He denies any chest pain or fevers.  Past Medical History:  Diagnosis Date  . CHF (congestive heart failure)  (H[facility])    ef=45-50%  . COPD (chronic obstructive pulmonary disease) ([place])   . GERD (gastroesophageal reflux disease)    ulcers  . Hyperlipidemia   . Hypertension    Past Surgical History:  Procedure Laterality Date  . EYE SURGERY     cataracts  . JOINT REPLACEMENT     knee both   Current Outpatient Prescriptions on File Prior to Visit  Medication Sig Dispense Refill  . albuterol-ipratropium (COMBIVENT) 18-103 MCG/ACT inhaler Inhale 2 puffs into the  lungs every 6 (six) hours as needed for wheezing or shortness of breath.    [name] aspirin 81 MG tablet Take 1 tablet (81 mg total) by mouth daily. 1 tablet 3  . atorvastatin (LIPITOR) 40 MG tablet TAKE 1 TABLET(40 MG) BY MOUTH DAILY 90 tablet 1  . COMBIVENT RESPIMAT 20-100 MCG/ACT AERS respimat INHALE 1 PUFF BY MOUTH EVERY 6 HOURS AS NEEDED FOR WHEEZING 4 g 3  . ferrous sulfate 325 (65 FE) MG tablet TAKE 1 TABLET BY MOUTH EVERY DAY WITH BREAKFAST 90 tablet 3  . furosemide (LASIX) 40 MG tablet TAKE 1 TABLET BY MOUTH ONCE DAILY as needed for swelling 90 tablet 0  . HYDROcodone-acetaminophen (NORCO/VICODIN) 5-325 MG tablet TK 1 T PO  BID. MUST LAST 30 DAYS 60 tablet 0  . ipratropium-albuterol (DUONEB) 0.5-2.5 (3) MG/3ML SOLN USE 1 VIAL VIA NEBULIZER EVERY 6 HOURS AS NEEDED 270 mL 0  . lisinopril-hydrochlorothiazide (PRINZIDE,ZESTORETIC) 20-12.5 MG tablet TAKE 1 TABLET BY MOUTH DAILY 90 tablet 3  . metoprolol tartrate (LOPRESSOR) 25 MG tablet Take 1.5 tablets (37.5 mg total) by mouth 2 (two) times daily. 270 tablet 3  . Multiple Vitamin (MULTIVITAMIN) tablet Take 1 tablet by mouth daily. Centrum Silver    . omeprazole (PRILOSEC) 20 MG capsule TAKE ONE CAPSULE BY MOUTH EVERY DAY 90 capsule 3  . predniSONE (DELTASONE) 20 MG tablet Take 40 mg by mouth daily. Has 2 days left to take    . Spacer/Aero-Holding Chambers DEVI 1 Device by Does not apply route as needed. 1 each 0  . Tiotropium Bromide-Olodaterol (STIOLTO RESPIMAT) 2.5-2.5 MCG/ACT AERS Inhale 2  puffs into the lungs daily. 1 Inhaler 11   No current facility-administered medications on file prior to visit.    No Known Allergies Social History   Social History  . Marital status: Divorced    Spouse name: N/A  . Number of children: 3  . Years of education: 9   Occupational History  . RetiredAnimal nutritionist Retired   Social History Main Topics  . Smoking status: Former Smoker    Packs/day: 1.00    Types: Cigarettes    Quit date: 09/29/2015  . Smokeless tobacco: Never Used  . Alcohol use No  . Drug use: No  . Sexual activity: Not Currently   Other Topics Concern  . Not on file   Social History Narrative   Grew up in [place] area.    Left school after 9th grade. No father and had to work.    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       Health Care POA:    Emergency Contact: sister, Hector Brunswick (h) [phone]   End of Life Plan:    Who lives with you: self   Any pets: Lorrin Goodell   Diet: Pt limits sugars and starches and focuses on vegetables an protein. Pt has lost 50 lbs over several years.   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   Seatbelts: Pt reports wearing seatbelt when in vehicle.   Sun Exposure/Protection: Pt does not use sun protectin.   Hobbies: Racing, TV, gardening, Architect            Review of Systems  All other systems reviewed and are negative.      Objective:   Physical Exam  Constitutional: He is oriented to person, place, and time. He appears well-developed and well-nourished. No distress.  Neck: Neck supple. No JVD present. No thyromegaly present.  Cardiovascular: Normal rate, regular rhythm, normal heart sounds and intact distal pulses.   No murmur heard. Pulmonary/Chest: Effort normal. No respiratory distress. He has decreased breath sounds. He has wheezes. He has no rhonchi. He has no rales.  Abdominal: Soft. Bowel sounds are normal. He exhibits no  distension. There is no tenderness. There is no rebound and no guarding.  Musculoskeletal: He exhibits edema.  Lymphadenopathy:    He has no cervical adenopathy.  Neurological: He is alert and oriented to person, place, and time. He has normal reflexes. No cranial nerve deficit. He exhibits normal muscle tone. Coordination normal.  Skin: He is not diaphoretic.  Vitals reviewed.         Assessment & Plan:  Hoarseness - Plan: Ambulatory referral to ENT  Cough with hemoptysis - Plan: CT Chest W Contrast, Ambulatory referral to ENT  COPD (chronic obstructive pulmonary disease) with chronic bronchitis ([place]) - Plan: CT Chest W Contrast  Dyspnea on exertion - Plan: CT Chest W Contrast  I believe his dyspnea on exertion is primarily due to COPD. He is scheduled to see the heart doctor to complete the workup for the hypokinesis found on his echocardiogram and to rule out ischemia as a potential cause of his dyspnea. I expect that this will likely confirm that his shortness of breath is due to his severe COPD.  Continue stiolto.  I recommended that he use oxygen with activity. We can consider putting him on a standing dose of prednisone if necessary for shortness of breath but will defer that at the present time. Right now I'm very concerned by his hemoptysis and hoarseness. I have recommended a CT scan of the lungs to evaluate for possible causes of hemoptysis. I will also schedule the patient to see ENT for laryngoscopy given his chronic hoarseness and hemoptysis to rule out a lesion on his vocal cords. Await the results of these referrals. Patient received his flu shot today.

## 2016-02-13 ENCOUNTER — Ambulatory Visit
Admission: RE | Admit: 2016-02-13 | Discharge: 2016-02-13 | Disposition: A | Discharge status: Home / Self Care | Payer: Medicare Other | Source: Ambulatory Visit | Attending: Family Medicine | Admitting: Family Medicine

## 2016-02-13 DIAGNOSIS — R911 Solitary pulmonary nodule: Secondary | ICD-10-CM | POA: Diagnosis not present

## 2016-02-13 DIAGNOSIS — R042 Hemoptysis: Secondary | ICD-10-CM

## 2016-02-13 DIAGNOSIS — R0609 Other forms of dyspnea: Secondary | ICD-10-CM

## 2016-02-13 DIAGNOSIS — J449 Chronic obstructive pulmonary disease, unspecified: Secondary | ICD-10-CM

## 2016-02-13 MED ORDER — IOPAMIDOL (ISOVUE-300) INJECTION 61%
75.0000 mL | Freq: Once | INTRAVENOUS | Status: AC | PRN
  Administered 2016-02-13: 75 mL via INTRAVENOUS

## 2016-02-14 ENCOUNTER — Other Ambulatory Visit: Payer: Self-pay | Admitting: Family Medicine

## 2016-02-14 ENCOUNTER — Ambulatory Visit (INDEPENDENT_AMBULATORY_CARE_PROVIDER_SITE_OTHER): Payer: Medicare Other | Admitting: Physician Assistant

## 2016-02-14 ENCOUNTER — Encounter: Payer: Self-pay | Admitting: Physician Assistant

## 2016-02-14 VITALS — BP 92/54 | HR 58 | Ht 68.5 in | Wt 196.0 lb

## 2016-02-14 DIAGNOSIS — K2289 Other specified disease of esophagus: Secondary | ICD-10-CM

## 2016-02-14 DIAGNOSIS — K228 Other specified diseases of esophagus: Secondary | ICD-10-CM

## 2016-02-14 DIAGNOSIS — R1314 Dysphagia, pharyngoesophageal phase: Secondary | ICD-10-CM

## 2016-02-14 DIAGNOSIS — R490 Dysphonia: Secondary | ICD-10-CM

## 2016-02-14 DIAGNOSIS — K229 Disease of esophagus, unspecified: Secondary | ICD-10-CM

## 2016-02-14 NOTE — Progress Notes (Signed)
Chief Complaint: Esophageal Mass, Dysphagia, Hoarseness  HPI:  Bradley Meadows is a 76 year old Caucasian male with a past medical history of COPD on oxygen at home, CHF with an EF of 45-50% on last echo 08/30/15, as well as hyperlipidemia and hypertension who was referred to me by Susy Frizzle, MD for a complaint of esophageal mass, hoarseness and dysphagia.    Recent CT of the chest on 02/13/16 for cough and hemoptysis as well as shortness of breath on exertion had finding of an esophageal mass highly worrisome for esophageal carcinoma. There was associated mass effect causing moderate narrowing of the lower trachea and bilateral mainstem bronchi. Subcarinal and gastrohepatic ligament adenopathy. 5 mm subpleural right lower lobe nodule. Aortic atherosclerosis with three-vessel coronary artery calcification and cholelithiasis.   Today, the patient presents to clinic accompanied by his daughter, who is a Marine scientist with the [employer], she does assist with this history. He explains that he had an imaging study yesterday and was called urgently by his doctor that afternoon for an appointment with [place] due to finding of a "esophageal mass". See study above. Patient does tell me he has had intermittent dysphagia symptoms while eating solid foods over the past few years, but none currently. He has also used Prilosec 20 mg once daily for "a long time" for reflux symptoms. Patient does tell me he has developed a new hoarseness over the past 2-3 months. He has also had a weight loss of around 9 pounds in the past month. He does admit to being a smoker for more than 25-30 years, he would smoke at least 2-3 packs a day. Patient stopped 9 years ago but then re-started 2 years ago again. He has recently quit smoking again in November 2017.   Past medical history significant for multiple hernia repairs. Today patient tells me that he feels like he has a hernia now as his "groin is bigger than normal". He would  prefer [place] not look at this today or for this to be evaluated any further until the above is worked out.   Patient denies fever, chills, blood in his stool, melena, change in bowel habits, fatigue, nausea, vomiting or symptoms that awaken him at night.   Past Medical History:  Diagnosis Date  . CHF (congestive heart failure) (HCC)    ef=45-50%  . COPD (chronic obstructive pulmonary disease) ([place])   . GERD (gastroesophageal reflux disease)    ulcers  . Hyperlipidemia   . Hypertension     Past Surgical History:  Procedure Laterality Date  . EYE SURGERY     cataracts  . JOINT REPLACEMENT     knee both    Current Outpatient Prescriptions  Medication Sig Dispense Refill  . albuterol-ipratropium (COMBIVENT) 18-103 MCG/ACT inhaler Inhale 2 puffs into the lungs every 6 (six) hours as needed for wheezing or shortness of breath.    [name] aspirin 81 MG tablet Take 1 tablet (81 mg total) by mouth daily. 1 tablet 3  . atorvastatin (LIPITOR) 40 MG tablet TAKE 1 TABLET(40 MG) BY MOUTH DAILY 90 tablet 1  . COMBIVENT RESPIMAT 20-100 MCG/ACT AERS respimat INHALE 1 PUFF BY MOUTH EVERY 6 HOURS AS NEEDED FOR WHEEZING 4 g 3  . ferrous sulfate 325 (65 FE) MG tablet TAKE 1 TABLET BY MOUTH EVERY DAY WITH BREAKFAST 90 tablet 3  . furosemide (LASIX) 40 MG tablet TAKE 1 TABLET BY MOUTH ONCE DAILY as needed for swelling 90 tablet 0  . HYDROcodone-acetaminophen (NORCO/VICODIN) 5-325  MG tablet TK 1 T PO  BID. MUST LAST 30 DAYS 60 tablet 0  . ipratropium-albuterol (DUONEB) 0.5-2.5 (3) MG/3ML SOLN USE 1 VIAL VIA NEBULIZER EVERY 6 HOURS AS NEEDED 270 mL 0  . lisinopril-hydrochlorothiazide (PRINZIDE,ZESTORETIC) 20-12.5 MG tablet TAKE 1 TABLET BY MOUTH DAILY 90 tablet 3  . metoprolol tartrate (LOPRESSOR) 25 MG tablet Take 1.5 tablets (37.5 mg total) by mouth 2 (two) times daily. 270 tablet 3  . Multiple Vitamin (MULTIVITAMIN) tablet Take 1 tablet by mouth daily. Centrum Silver    . omeprazole (PRILOSEC) 20 MG capsule  TAKE ONE CAPSULE BY MOUTH EVERY DAY 90 capsule 3  . Spacer/Aero-Holding Chambers DEVI 1 Device by Does not apply route as needed. 1 each 0  . Tiotropium Bromide-Olodaterol (STIOLTO RESPIMAT) 2.5-2.5 MCG/ACT AERS Inhale 2 puffs into the lungs daily. 1 Inhaler 11   No current facility-administered medications for this visit.     Allergies as of 02/14/2016  . (No Known Allergies)    Family History  Problem Relation Age of Onset  . Arthritis Mother   . COPD Mother   . Alcohol abuse Father   . Breast cancer Sister   . Heart disease Paternal Grandfather   . Hyperlipidemia Sister     Social History   Social History  . Marital status: Divorced    Spouse name: N/A  . Number of children: 3  . Years of education: 9   Occupational History  . RetiredAnimal nutritionist Retired   Social History Main Topics  . Smoking status: Former Smoker    Packs/day: 1.00    Types: Cigarettes    Quit date: 09/29/2015  . Smokeless tobacco: Never Used  . Alcohol use No  . Drug use: No  . Sexual activity: Not Currently   Other Topics Concern  . Not on file   Social History Narrative   Grew up in [place] area.    Left school after 9th grade. No father and had to work.    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       Health Care POA:    Emergency Contact: sister, Hector [place] (h) [phone]   End of Life Plan:    Who lives with you: self   Any pets: Lorrin Goodell   Diet: Pt limits sugars and starches and focuses on vegetables an protein. Pt has lost 50 lbs over several years.   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   Seatbelts: Pt reports wearing seatbelt when in vehicle.   Sun Exposure/Protection: Pt does not use sun protectin.   Hobbies: Racing, TV, gardening, Architect          Review of Systems:     Constitutional: Positive for weight loss of 9 pounds in the past month No fever or chills HEENT: Eyes: No change  in vision               Ears, Nose, Throat:  No change in hearing  Skin: No rash Cardiovascular: No chest pain Respiratory: Positive for chronic shortness of breath and cough as well as some hemoptysis  Gastrointestinal: See HPI and otherwise negative Genitourinary: No dysuria or change in urinary frequency Neurological: No headache Musculoskeletal: No new muscle or joint pain Hematologic: No bruising Psychiatric: No history of depression or anxiety   Physical Exam:  Vital signs: BP (!) 92/54   Pulse (!) 58   Ht 5' 8.5" (1.74 m)   Wt 196 lb (88.9 kg)  BMI 29.36 kg/m   Constitutional:   Pleasant Elderly, chronically ill appearing Caucasian male appears to be in NAD, Well developed, Well nourished, alert and cooperative Head:  Normocephalic and atraumatic. Eyes:   PEERL, EOMI. No icterus. Conjunctiva pink. Ears:  Normal auditory acuity. Neck:  Supple Throat: Oral cavity and pharynx without inflammation, swelling or lesion.  Respiratory: Respirations even and unlabored. Bilateral wheezes, decreased breath sounds Cardiovascular: Normal S1, S2. No MRG. Regular rate and rhythm. Positive for peripheral edema Gastrointestinal:  Soft, nondistended, nontender. No rebound or guarding. Normal bowel sounds. No appreciable masses or hepatomegaly. Rectal:  Not performed.  Msk:  Symmetrical without gross deformities. Without edema, no deformity or joint abnormality.  Neurologic:  Alert and  oriented x4;  grossly normal neurologically.  Skin:   Dry and intact without significant lesions or rashes. Psychiatric:Demonstrates good judgement and reason without abnormal affect or behaviors.  RELEVANT LABS AND IMAGING: CBC    Component Value Date/Time   WBC 7.9 08/14/2015 1255   RBC 4.33 08/14/2015 1255   HGB 13.6 08/14/2015 1255   HCT 38.8 08/14/2015 1255   PLT 205 08/14/2015 1255   MCV 89.6 08/14/2015 1255   MCH 31.4 08/14/2015 1255   MCHC 35.1 08/14/2015 1255   RDW 13.9 08/14/2015 1255    LYMPHSABS 2,133 08/14/2015 1255   MONOABS 632 08/14/2015 1255   EOSABS 158 08/14/2015 1255   BASOSABS 0 08/14/2015 1255    CMP     Component Value Date/Time   NA 127 (L) 08/14/2015 1255   K 4.1 08/14/2015 1255   CL 90 (L) 08/14/2015 1255   CO2 22 08/14/2015 1255   GLUCOSE 93 08/14/2015 1255   BUN 8 08/14/2015 1255   CREATININE 0.81 08/14/2015 1255   CALCIUM 9.2 08/14/2015 1255   PROT 6.0 (L) 08/14/2015 1255   ALBUMIN 3.9 08/14/2015 1255   AST 16 08/14/2015 1255   ALT 12 08/14/2015 1255   ALKPHOS 70 08/14/2015 1255   BILITOT 0.8 08/14/2015 1255   GFRNONAA 87 08/14/2015 1255   GFRAA >89 08/14/2015 1255   CT CHEST WITH CONTRAST 02/13/16  TECHNIQUE: Multidetector CT imaging of the chest was performed during intravenous contrast administration.  CONTRAST:  75 cc Isovue-300.  Creatinine was obtained on site at [facility] at [address]Results: Creatinine 0.7 mg/dL.  COMPARISON:  None.  FINDINGS: Cardiovascular: Atherosclerotic calcification of the arterial vasculature, including three-vessel involvement of the coronary arteries. Heart is within normal limits for size. No pericardial effusion.  Mediastinum/Nodes: A soft tissue mass is seen in the esophagus, measuring 3.6 x 3.6 cm, for a length of approximately 3.6 cm. The esophagus is dilated proximally. There is mass effect on and moderate narrowing of the lower trachea and mainstem bronchi bilaterally. Subcarinal lymph node measures 1.4 cm. There are additional subcentimeter mediastinal, right hilar and periesophageal lymph nodes. No axillary adenopathy.  Lungs/Pleura: Mild centrilobular emphysema. Mild scarring in the lower lobes. 5 mm subpleural posterior right lower lobe nodule (series 5, image 86). No pleural fluid. Narrowing of the lower trachea and bilateral mainstem bronchi is again mentioned due to compression by an extrinsic esophageal mass. Narrowing of the right mainstem bronchus  is severe.  Upper Abdomen: Visualized portion of the liver is unremarkable. A stone is seen in the gallbladder. Visualized portions of the adrenal glands, kidneys, spleen, pancreas, stomach and bowel are grossly unremarkable. Gastrohepatic ligament lymph node measures 1.4 cm (series 2, image 139).  Musculoskeletal: No worrisome lytic or sclerotic lesions.  IMPRESSION:  1. 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 Associated mass effect on and moderate narrowing of the lower trachea and bilateral mainstem bronchi. These results will be called to the ordering clinician or representative by the Radiologist Assistant, and communication documented in the PACS or zVision Dashboard. 2. Subcarinal and gastrohepatic ligament adenopathy. 3. 5 mm subpleural right lower lobe nodule. Attention on followup exams is warranted. 4. Aortic atherosclerosis (ICD10-170.0). Three-vessel coronary artery calcification. 5. Cholelithiasis.   Electronically Signed   By: Lorin Picket M.D.   On: 02/13/2016 13:28  Assessment: 1. Esophageal mass: Seen at time of recent CT of the chest on 02/13/16, see report above; highly concerning for esophageal carcinoma 2. Dysphagia: Intermittent per patient, currently no symptoms, likely with above 3. Hoarseness: With above  Plan: 1. Patient will proceed with emergent EGD for further evaluation. Discussed risks, benefits, limitations and alternatives the patient agrees to proceed. This will likely be scheduled with Dr. Henrene Pastor in the hospital next week as patient is on home O2. Patient did decline an appointment tomorrow with Dr. Hilarie Fredrickson as he has a "very important" appointment with the cardiologist. He is aware of the findings on CT and that this could represent cancer, but would prefer to wait until next week. Preferably until after next Wednesday for EGD. 2. We will call the patient with an appointment. 3. Patient to continue his Prilosec 20 mg  daily, 30-60 minutes before breakfast 4. Encouraged the patient to continue his tobacco cessation 5. Patient to follow up in clinic per Dr. Celesta Aver recommendations after time of EGD with Dr. Henrene Pastor.  Ellouise Newer, PA-C [facility] 02/14/2016, 2:15 PM  Cc: Susy Frizzle, MD

## 2016-02-14 NOTE — Patient Instructions (Signed)
If you are age 76 or older, your body mass index should be between 23-30. Your Body mass index is 29.36 kg/m. If this is out of the aforementioned range listed, please consider follow up with your Primary Care Provider.  If you are age 71 or younger, your body mass index should be between 19-25. Your Body mass index is 29.36 kg/m. If this is out of the aformentioned range listed, please consider follow up with your Primary Care Provider.   We will contact you with an appointment for your endoscopy procedure.  Thank you.

## 2016-02-15 ENCOUNTER — Other Ambulatory Visit: Payer: Self-pay

## 2016-02-15 ENCOUNTER — Telehealth: Payer: Self-pay

## 2016-02-15 ENCOUNTER — Ambulatory Visit (INDEPENDENT_AMBULATORY_CARE_PROVIDER_SITE_OTHER): Payer: Medicare Other | Admitting: Cardiovascular Disease

## 2016-02-15 ENCOUNTER — Encounter: Payer: Self-pay | Admitting: Cardiovascular Disease

## 2016-02-15 VITALS — BP 110/57 | HR 74 | Ht 68.0 in | Wt 193.6 lb

## 2016-02-15 DIAGNOSIS — I5022 Chronic systolic (congestive) heart failure: Secondary | ICD-10-CM | POA: Diagnosis not present

## 2016-02-15 DIAGNOSIS — K2289 Other specified disease of esophagus: Secondary | ICD-10-CM

## 2016-02-15 DIAGNOSIS — I10 Essential (primary) hypertension: Secondary | ICD-10-CM | POA: Diagnosis not present

## 2016-02-15 DIAGNOSIS — R0609 Other forms of dyspnea: Secondary | ICD-10-CM

## 2016-02-15 DIAGNOSIS — E871 Hypo-osmolality and hyponatremia: Secondary | ICD-10-CM | POA: Diagnosis not present

## 2016-02-15 DIAGNOSIS — K228 Other specified diseases of esophagus: Secondary | ICD-10-CM

## 2016-02-15 DIAGNOSIS — E782 Mixed hyperlipidemia: Secondary | ICD-10-CM

## 2016-02-15 DIAGNOSIS — J42 Unspecified chronic bronchitis: Secondary | ICD-10-CM

## 2016-02-15 DIAGNOSIS — I2510 Atherosclerotic heart disease of native coronary artery without angina pectoris: Secondary | ICD-10-CM | POA: Diagnosis not present

## 2016-02-15 DIAGNOSIS — Z720 Tobacco use: Secondary | ICD-10-CM | POA: Diagnosis not present

## 2016-02-15 NOTE — Telephone Encounter (Signed)
Okay thanks Julie 

## 2016-02-15 NOTE — Patient Instructions (Signed)
Your physician has requested that you have a lexiscan myoview next Tuesday. For further information please visit [email or web address]. Please follow instruction sheet, as given.  Dr Sallyanne Kuster recommends that you schedule a follow-up appointment in 3 months.  If you need a refill on your cardiac medications before your next appointment, please call your pharmacy.

## 2016-02-15 NOTE — Telephone Encounter (Signed)
Spoke to patient, let him know he is scheduled for EGD on 1/18 (thursday) at 10:00 [facility] end[facility]. Mailed prep instructions to him.

## 2016-02-15 NOTE — Progress Notes (Signed)
Cardiology consultation Note    Date:  02/15/2016   ID:  Bradley Meadows, DOB 1940-08-02, MRN [id number]  PCP:  Odette Fraction, MD  Cardiologist:   Sanda Klein, MD  Consultation requested for: Dyspnea, depressed left ventricular systolic function, PVCs  Chief Complaint  Patient presents with  . Follow-up  . Dizziness    randomly.    History of Present Illness:  Bradley Meadows is a 76 y.o. male with a long-standing history of coronary artery disease. He is referred by Dr. Dennard Schaumann after his echocardiogram showed mildly depressed left ventricular ejection fraction of roughly 45% with diffuse hypokinesis. He also noted extremely frequent PVCs. I recommended a nuclear stress test, but he canceled it. He now returns after being diagnosed with a large esophageal mass that appears to be causing extrinsic compression of his large airways. He is scheduled for a EGD with propofol IV sedation next Thursday with Dr. Havery Moros.  The patient describes NYHA functional class II exertional dyspnea, but also has episodes of dyspnea at rest without obvious cause. He often wheezes. He has a chronic cough. He denies any chest discomfort either currently or in the past. He is not troubled by leg edema. Fatigue is a prominent complaint.  In March 2008 a nuclear study showed the presence of a lateral wall scar and mildly reduced ejection fraction at 49%. In April 2008 coronary angiography showed left circumflex coronary artery occlusion with filling via collaterals. Revascularization was not performed. Angina was never a significant complaint, but his workup was initiated due to dyspnea. He also has significant problems with COPD. He successfully quit smoking for about 8 years that started smoking again 3-4 years ago. He has chronic wheezing, taking a combination of bronchodilators. He has well treated systemic hypertension in his medications include an ACE inhibitor and a beta blocker. He takes a diuretic on an  occasional basis for swelling. He is receiving a statin for hyperlipidemia and his LDL cholesterol level is excellent. His HDL cholesterol is borderline low. He does not describe excessive hypersomnolence, and only scores 4 points on the Epworth Sleepiness Scale. He is borderline obese with a BMI just under 30.  He is unaware of palpitations and has not experienced syncope. He denies claudication or focal neurological complaints.  Past Medical History:  Diagnosis Date  . CHF (congestive heart failure) (HCC)    ef=45-50%  . COPD (chronic obstructive pulmonary disease) ([place])   . GERD (gastroesophageal reflux disease)    ulcers  . Hyperlipidemia   . Hypertension     Past Surgical History:  Procedure Laterality Date  . EYE SURGERY     cataracts  . JOINT REPLACEMENT     knee both    Current Medications: Outpatient Medications Prior to Visit  Medication Sig Dispense Refill  . albuterol-ipratropium (COMBIVENT) 18-103 MCG/ACT inhaler Inhale 2 puffs into the lungs every 6 (six) hours as needed for wheezing or shortness of breath.    [name] aspirin 81 MG tablet Take 1 tablet (81 mg total) by mouth daily. 1 tablet 3  . atorvastatin (LIPITOR) 40 MG tablet TAKE 1 TABLET(40 MG) BY MOUTH DAILY 90 tablet 1  . COMBIVENT RESPIMAT 20-100 MCG/ACT AERS respimat INHALE 1 PUFF BY MOUTH EVERY 6 HOURS AS NEEDED FOR WHEEZING 4 g 3  . ferrous sulfate 325 (65 FE) MG tablet TAKE 1 TABLET BY MOUTH EVERY DAY WITH BREAKFAST 90 tablet 3  . furosemide (LASIX) 40 MG tablet TAKE 1 TABLET BY MOUTH ONCE DAILY  as needed for swelling 90 tablet 0  . HYDROcodone-acetaminophen (NORCO/VICODIN) 5-325 MG tablet TK 1 T PO  BID. MUST LAST 30 DAYS 60 tablet 0  . ipratropium-albuterol (DUONEB) 0.5-2.5 (3) MG/3ML SOLN USE 1 VIAL VIA NEBULIZER EVERY 6 HOURS AS NEEDED 270 mL 0  . lisinopril-hydrochlorothiazide (PRINZIDE,ZESTORETIC) 20-12.5 MG tablet TAKE 1 TABLET BY MOUTH DAILY 90 tablet 3  . metoprolol tartrate (LOPRESSOR) 25 MG tablet  Take 1.5 tablets (37.5 mg total) by mouth 2 (two) times daily. 270 tablet 3  . Multiple Vitamin (MULTIVITAMIN) tablet Take 1 tablet by mouth daily. Centrum Silver    . omeprazole (PRILOSEC) 20 MG capsule TAKE ONE CAPSULE BY MOUTH EVERY DAY 90 capsule 3  . Spacer/Aero-Holding Chambers DEVI 1 Device by Does not apply route as needed. 1 each 0  . Tiotropium Bromide-Olodaterol (STIOLTO RESPIMAT) 2.5-2.5 MCG/ACT AERS Inhale 2 puffs into the lungs daily. 1 Inhaler 11   No facility-administered medications prior to visit.      Allergies:   Patient has no known allergies.   Social History   Social History  . Marital status: Divorced    Spouse name: N/A  . Number of children: 3  . Years of education: 9   Occupational History  . RetiredAnimal nutritionist Retired   Social History Main Topics  . Smoking status: Former Smoker    Packs/day: 1.00    Types: Cigarettes    Quit date: 09/29/2015  . Smokeless tobacco: Never Used  . Alcohol use No  . Drug use: No  . Sexual activity: Not Currently   Other Topics Concern  . None   Social History Narrative   Grew up in [place] area.    Left school after 9th grade. No father and had to work.    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       Health Care POA:    Emergency Contact: sister, Bradley Meadows (h) [phone]   End of Life Plan:    Who lives with you: self   Any pets: Lorrin Goodell   Diet: Pt limits sugars and starches and focuses on vegetables an protein. Pt has lost 50 lbs over several years.   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   Seatbelts: Pt reports wearing seatbelt when in vehicle.   Sun Exposure/Protection: Pt does not use sun protectin.   Hobbies: Racing, TV, gardening, Architect           Family History:  The patient's family history includes Alcohol abuse in his father; Arthritis in his mother; Breast cancer in his sister; COPD in his mother; Heart  disease in his paternal grandfather; Hyperlipidemia in his sister.   ROS:   Please see the history of present illness.    ROS All other systems reviewed and are negative.   PHYSICAL EXAM:   VS:  BP (!) 110/57   Pulse 74   Ht '5\' 8"'$  (1.727 m)   Wt 87.8 kg (193 lb 9.6 oz)   BMI 29.44 kg/m    GEN: Well nourished, well developed, in no acute distress  HEENT: normal  Neck: no JVD, carotid bruits, or masses. Has intermittent stridor Cardiac: Frequent ectopic beats in a background of RRR; no murmurs, rubs, or gallops,no edema  Respiratory:  Emphysematous chest, decreased breath sounds throughout but without wheezing, otherwise clear to auscultation bilaterally, normal work of breathing GI: soft, nontender, nondistended, + BS MS: no deformity or atrophy  Skin: warm and dry,  no rash Neuro:  Alert and Oriented x 3, Strength and sensation are intact Psych: euthymic mood, full affect  Wt Readings from Last 3 Encounters:  02/15/16 87.8 kg (193 lb 9.6 oz)  02/14/16 88.9 kg (196 lb)  01/31/16 90.7 kg (200 lb)      Studies/Labs Reviewed:   EKG:  EKG is ordered today.  The ekg ordered today demonstrates Sinus rhythm with frequent PACs, biatrial enlargement, left axis deviation incomplete right bundle branch block, no ischemic repolarization abnormalities, QTC normal  Recent Labs: 08/14/2015: ALT 12; Brain Natriuretic Peptide 33.0; BUN 8; Creat 0.81; Hemoglobin 13.6; Platelets 205; Potassium 4.1; Sodium 127   Lipid Panel    Component Value Date/Time   CHOL 114 (L) 04/25/2015 0825   TRIG 92 04/25/2015 0825   HDL 35 (L) 04/25/2015 0825   CHOLHDL 3.3 04/25/2015 0825   VLDL 18 04/25/2015 0825   LDLCALC 61 04/25/2015 0825   LDLDIRECT 82 05/26/2012 1141     ASSESSMENT:    1. Chronic systolic heart failure ([place])   2. Coronary artery disease involving native coronary artery of native heart without angina pectoris   3. Chronic bronchitis, unspecified chronic bronchitis type ([place])   4.  Mixed hyperlipidemia   5. HYPERTENSION, BENIGN SYSTEMIC   6. Tobacco abuse   7. Chronic hyponatremia   8. Dyspnea on exertion      PLAN:  In order of problems listed above:  1. CHF: He does have depressed left ventricular systolic function. However, at this point in time I think he appears to be euvolemic/well compensated. He is on appropriate treatment with ace inhibitors and beta blockers.  2. CAD: Mr. Slinker does not have angina pectoris but was never really aware of angina in the past either. He has mildly depressed left ventricular systolic function due to scar in the left ventricular lateral wall, in turn secondary to chronic total occlusion of the left circumflex coronary artery. I think it is worthwhile repeating a nuclear scan, specially since there is a chance he will be considered for esophageal surgery. If he continues to have scar without ischemia in the lateral wall and no other abnormalities, I think it can be safe to assume that there has not been significant progression of his coronary issues and we can avoid invasive evaluation. If substantial areas of myocardial ischemia are identified he should undergo repeat coronary angiography for any major surgical procedure. I think the upcoming upper endoscopy can be safely performed whether or not there is ischemia on his nuclear stress test. 3. COPD: It appears that extrinsic compression of the trachea and major bronchi may also be contributing to his dyspnea.  4. HLP: Excellent LDL cholesterol level 5. HTN: Excellent blood pressure control. 6. Smoking cessation: Congratulated on smoking cessation 7. Hyponatremia: Appears to be a chronic abnormality, dating back as far as 2008. Hopefully not a sign of paraneoplastic SIADH.  Medication Adjustments/Labs and Tests Ordered: Current medicines are reviewed at length with the patient today.  Concerns regarding medicines are outlined above.  Medication changes, Labs and Tests ordered today  are listed in the Patient Instructions below. Patient Instructions  Your physician has requested that you have a lexiscan myoview next Tuesday. For further information please visit [email or web address]. Please follow instruction sheet, as given.  Dr Sallyanne Kuster recommends that you schedule a follow-up appointment in 3 months.  If you need a refill on your cardiac medications before your next appointment, please call your pharmacy.    Signed, Sanda Klein, MD  02/15/2016 6:31 PM    [facility] [address], [place], [place]  [address] Phone: [phone]; Fax: [phone]

## 2016-02-15 NOTE — Progress Notes (Signed)
Dr. Carlean Purl supervising. Sent to me thinking I was the hospital doctor next week. However, it is Dr. Havery Moros. In any event, Arranging hospital EGD with Dr. Havery Moros (I will forward him this note)

## 2016-02-15 NOTE — Progress Notes (Signed)
Agree with plan as outlined. I will perform EGD next week at hospital for expedited evaluation.

## 2016-02-15 NOTE — Progress Notes (Signed)
Prep letter mailed.

## 2016-02-17 NOTE — Progress Notes (Signed)
Agree w/ EGD soon. Gatha Mayer, MD, [facility]

## 2016-02-19 ENCOUNTER — Ambulatory Visit (HOSPITAL_COMMUNITY)
Admission: RE | Admit: 2016-02-19 | Discharge: 2016-02-19 | Disposition: A | Discharge status: Home / Self Care | Payer: Medicare Other | Source: Ambulatory Visit | Attending: Cardiology | Admitting: Cardiology

## 2016-02-19 ENCOUNTER — Encounter (HOSPITAL_COMMUNITY): Payer: Self-pay | Admitting: *Deleted

## 2016-02-19 DIAGNOSIS — Z87891 Personal history of nicotine dependence: Secondary | ICD-10-CM | POA: Insufficient documentation

## 2016-02-19 DIAGNOSIS — J449 Chronic obstructive pulmonary disease, unspecified: Secondary | ICD-10-CM | POA: Insufficient documentation

## 2016-02-19 DIAGNOSIS — Z8249 Family history of ischemic heart disease and other diseases of the circulatory system: Secondary | ICD-10-CM | POA: Insufficient documentation

## 2016-02-19 DIAGNOSIS — E785 Hyperlipidemia, unspecified: Secondary | ICD-10-CM | POA: Diagnosis not present

## 2016-02-19 DIAGNOSIS — I2510 Atherosclerotic heart disease of native coronary artery without angina pectoris: Secondary | ICD-10-CM | POA: Insufficient documentation

## 2016-02-19 DIAGNOSIS — I110 Hypertensive heart disease with heart failure: Secondary | ICD-10-CM | POA: Diagnosis not present

## 2016-02-19 DIAGNOSIS — R0609 Other forms of dyspnea: Secondary | ICD-10-CM | POA: Diagnosis not present

## 2016-02-19 DIAGNOSIS — I5022 Chronic systolic (congestive) heart failure: Secondary | ICD-10-CM | POA: Insufficient documentation

## 2016-02-19 DIAGNOSIS — I4510 Unspecified right bundle-branch block: Secondary | ICD-10-CM | POA: Diagnosis not present

## 2016-02-19 LAB — MYOCARDIAL PERFUSION IMAGING
CHL CUP NUCLEAR SDS: 0
CHL CUP NUCLEAR SRS: 11
CHL CUP NUCLEAR SSS: 11
CSEPPHR: 75 {beats}/min
LV dias vol: 136 mL (ref 62–150)
LV sys vol: 64 mL
Rest HR: 65 {beats}/min
TID: 1.1

## 2016-02-19 MED ORDER — AMINOPHYLLINE 25 MG/ML IV SOLN
75.0000 mg | Freq: Once | INTRAVENOUS | Status: AC
  Administered 2016-02-19: 75 mg via INTRAVENOUS

## 2016-02-19 MED ORDER — TECHNETIUM TC 99M TETROFOSMIN IV KIT
10.7000 | PACK | Freq: Once | INTRAVENOUS | Status: AC | PRN
  Administered 2016-02-19: 10.7 via INTRAVENOUS
  Filled 2016-02-19: qty 11

## 2016-02-19 MED ORDER — TECHNETIUM TC 99M TETROFOSMIN IV KIT
32.0000 | PACK | Freq: Once | INTRAVENOUS | Status: AC | PRN
  Administered 2016-02-19: 32 via INTRAVENOUS
  Filled 2016-02-19: qty 32

## 2016-02-19 MED ORDER — REGADENOSON 0.4 MG/5ML IV SOLN
0.4000 mg | Freq: Once | INTRAVENOUS | Status: AC
  Administered 2016-02-19: 0.4 mg via INTRAVENOUS

## 2016-02-21 ENCOUNTER — Ambulatory Visit (HOSPITAL_COMMUNITY): Admission: RE | Admit: 2016-02-21 | Payer: Medicare Other | Source: Ambulatory Visit | Admitting: Gastroenterology

## 2016-02-21 SURGERY — ESOPHAGOGASTRODUODENOSCOPY (EGD) WITH PROPOFOL
Anesthesia: Monitor Anesthesia Care

## 2016-02-22 ENCOUNTER — Telehealth: Payer: Self-pay | Admitting: Gastroenterology

## 2016-02-22 ENCOUNTER — Other Ambulatory Visit: Payer: Self-pay

## 2016-02-22 DIAGNOSIS — K2289 Other specified disease of esophagus: Secondary | ICD-10-CM

## 2016-02-22 DIAGNOSIS — K228 Other specified diseases of esophagus: Secondary | ICD-10-CM

## 2016-02-22 NOTE — Telephone Encounter (Signed)
No problem. Thanks 

## 2016-02-22 NOTE — Telephone Encounter (Signed)
Thank you so much! He is scheduled for 02/26/16, Tuesday at 12:30 at [facility]. Daughter advised of date/time and prep instructions. She was most appreciative.

## 2016-02-22 NOTE — Telephone Encounter (Signed)
Almyra Free he was scheduled with me as I was the hospital doc this week, otherwise it would have gone to another physician. Can you ask Dr. Henrene Pastor if he can help out with it this upcoming week in the hospital as I believe he is the hospital doc. If not let me know. Thanks

## 2016-02-22 NOTE — Telephone Encounter (Signed)
Thanks very much John, appreciate the help

## 2016-02-22 NOTE — Telephone Encounter (Signed)
Just spoke to patient's daughter about them having to cancel yesterday's EGD. Patient did not feel safe out on the roads. His daughter also just diagnosed with the flu today, she lives in [place] away from him. She is supposedly his only transportation. When would you like me to reschedule this patient?

## 2016-02-22 NOTE — Telephone Encounter (Signed)
Dr. Henrene Pastor is there anyway you can do this gentleman's EGD next week? He has an esophageal mass and was supposed to have had it done yesterday but was unable to get out due to the weather. Thank you.

## 2016-02-22 NOTE — Telephone Encounter (Signed)
No problem. Schedule it with MAC/propofol next week. Me know what day, place, and time. Thanks

## 2016-02-25 ENCOUNTER — Encounter (HOSPITAL_COMMUNITY): Payer: Self-pay

## 2016-02-26 ENCOUNTER — Ambulatory Visit (HOSPITAL_COMMUNITY)
Admission: RE | Admit: 2016-02-26 | Discharge: 2016-02-26 | Disposition: A | Discharge status: Home / Self Care | Payer: Medicare Other | Source: Ambulatory Visit | Attending: Internal Medicine | Admitting: Internal Medicine

## 2016-02-26 ENCOUNTER — Encounter (HOSPITAL_COMMUNITY): Payer: Self-pay | Admitting: *Deleted

## 2016-02-26 ENCOUNTER — Ambulatory Visit (HOSPITAL_COMMUNITY): Payer: Medicare Other | Admitting: Anesthesiology

## 2016-02-26 ENCOUNTER — Other Ambulatory Visit: Payer: Self-pay

## 2016-02-26 ENCOUNTER — Encounter (HOSPITAL_COMMUNITY)
Admission: RE | Disposition: A | Discharge status: Home / Self Care | Payer: Self-pay | Source: Ambulatory Visit | Attending: Internal Medicine

## 2016-02-26 ENCOUNTER — Telehealth: Payer: Self-pay

## 2016-02-26 DIAGNOSIS — K3184 Gastroparesis: Secondary | ICD-10-CM | POA: Diagnosis not present

## 2016-02-26 DIAGNOSIS — I110 Hypertensive heart disease with heart failure: Secondary | ICD-10-CM | POA: Insufficient documentation

## 2016-02-26 DIAGNOSIS — J449 Chronic obstructive pulmonary disease, unspecified: Secondary | ICD-10-CM | POA: Insufficient documentation

## 2016-02-26 DIAGNOSIS — I509 Heart failure, unspecified: Secondary | ICD-10-CM | POA: Insufficient documentation

## 2016-02-26 DIAGNOSIS — J9859 Other diseases of mediastinum, not elsewhere classified: Secondary | ICD-10-CM

## 2016-02-26 DIAGNOSIS — Z9981 Dependence on supplemental oxygen: Secondary | ICD-10-CM | POA: Diagnosis not present

## 2016-02-26 DIAGNOSIS — R933 Abnormal findings on diagnostic imaging of other parts of digestive tract: Secondary | ICD-10-CM | POA: Insufficient documentation

## 2016-02-26 DIAGNOSIS — J398 Other specified diseases of upper respiratory tract: Secondary | ICD-10-CM

## 2016-02-26 DIAGNOSIS — I2510 Atherosclerotic heart disease of native coronary artery without angina pectoris: Secondary | ICD-10-CM | POA: Diagnosis not present

## 2016-02-26 DIAGNOSIS — Z7982 Long term (current) use of aspirin: Secondary | ICD-10-CM | POA: Diagnosis not present

## 2016-02-26 DIAGNOSIS — R938 Abnormal findings on diagnostic imaging of other specified body structures: Secondary | ICD-10-CM | POA: Diagnosis not present

## 2016-02-26 DIAGNOSIS — K228 Other specified diseases of esophagus: Secondary | ICD-10-CM

## 2016-02-26 DIAGNOSIS — K219 Gastro-esophageal reflux disease without esophagitis: Secondary | ICD-10-CM | POA: Diagnosis not present

## 2016-02-26 DIAGNOSIS — Z79899 Other long term (current) drug therapy: Secondary | ICD-10-CM | POA: Diagnosis not present

## 2016-02-26 DIAGNOSIS — Z87891 Personal history of nicotine dependence: Secondary | ICD-10-CM | POA: Insufficient documentation

## 2016-02-26 DIAGNOSIS — R935 Abnormal findings on diagnostic imaging of other abdominal regions, including retroperitoneum: Secondary | ICD-10-CM | POA: Diagnosis not present

## 2016-02-26 DIAGNOSIS — K229 Disease of esophagus, unspecified: Secondary | ICD-10-CM

## 2016-02-26 DIAGNOSIS — K2289 Other specified disease of esophagus: Secondary | ICD-10-CM

## 2016-02-26 DIAGNOSIS — R9389 Abnormal findings on diagnostic imaging of other specified body structures: Secondary | ICD-10-CM

## 2016-02-26 DIAGNOSIS — R490 Dysphonia: Secondary | ICD-10-CM

## 2016-02-26 HISTORY — PX: ESOPHAGOGASTRODUODENOSCOPY (EGD) WITH PROPOFOL: SHX5813

## 2016-02-26 SURGERY — ESOPHAGOGASTRODUODENOSCOPY (EGD) WITH PROPOFOL
Anesthesia: Monitor Anesthesia Care

## 2016-02-26 MED ORDER — PROPOFOL 10 MG/ML IV BOLUS
INTRAVENOUS | Status: AC
  Filled 2016-02-26: qty 40

## 2016-02-26 MED ORDER — PROPOFOL 500 MG/50ML IV EMUL
INTRAVENOUS | Status: DC | PRN
  Administered 2016-02-26: 200 ug/kg/min via INTRAVENOUS

## 2016-02-26 MED ORDER — SODIUM CHLORIDE 0.9 % IV SOLN: INTRAVENOUS | Status: DC

## 2016-02-26 MED ORDER — LACTATED RINGERS IV SOLN
INTRAVENOUS | Status: DC
  Administered 2016-02-26: 1000 mL via INTRAVENOUS

## 2016-02-26 MED ORDER — ONDANSETRON HCL 4 MG/2ML IJ SOLN
INTRAMUSCULAR | Status: AC
  Filled 2016-02-26: qty 2

## 2016-02-26 MED ORDER — PROPOFOL 10 MG/ML IV BOLUS
INTRAVENOUS | Status: DC | PRN
  Administered 2016-02-26: 20 mg via INTRAVENOUS
  Administered 2016-02-26: 10 mg via INTRAVENOUS

## 2016-02-26 MED ORDER — ONDANSETRON HCL 4 MG/2ML IJ SOLN
INTRAMUSCULAR | Status: DC | PRN
  Administered 2016-02-26: 4 mg via INTRAVENOUS

## 2016-02-26 SURGICAL SUPPLY — 15 items

## 2016-02-26 NOTE — Discharge Instructions (Signed)
YOU HAD AN ENDOSCOPIC PROCEDURE TODAY: Refer to the procedure report and other information in the discharge instructions given to you for any specific questions about what was found during the examination. If this information does not answer your questions, please call [place] office at [phone] to clarify.  ° °YOU SHOULD EXPECT: Some feelings of bloating in the abdomen. Passage of more gas than usual. Walking can help get rid of the air that was put into your GI tract during the procedure and reduce the bloating. If you had a lower endoscopy (such as a colonoscopy or flexible sigmoidoscopy) you may notice spotting of blood in your stool or on the toilet paper. Some abdominal soreness may be present for a day or two, also. ° °DIET: Your first meal following the procedure should be a light meal and then it is ok to progress to your normal diet. A half-sandwich or bowl of soup is an example of a good first meal. Heavy or fried foods are harder to digest and may make you feel nauseous or bloated. Drink plenty of fluids but you should avoid alcoholic beverages for 24 hours. If you had a esophageal dilation, please see attached instructions for diet.   ° °ACTIVITY: Your care partner should take you home directly after the procedure. You should plan to take it easy, moving slowly for the rest of the day. You can resume normal activity the day after the procedure however YOU SHOULD NOT DRIVE, use power tools, machinery or perform tasks that involve climbing or major physical exertion for 24 hours (because of the sedation medicines used during the test).  ° °SYMPTOMS TO REPORT IMMEDIATELY: °A gastroenterologist can be reached at any hour. Please call [phone]  for any of the following symptoms:  °Following lower endoscopy (colonoscopy, flexible sigmoidoscopy) °Excessive amounts of blood in the stool  °Significant tenderness, worsening of abdominal pains  °Swelling of the abdomen that is new, acute  °Fever of 100° or  higher  °Following upper endoscopy (EGD, EUS, ERCP, esophageal dilation) °Vomiting of blood or coffee ground material  °New, significant abdominal pain  °New, significant chest pain or pain under the shoulder blades  °Painful or persistently difficult swallowing  °New shortness of breath  °Black, tarry-looking or red, bloody stools ° °FOLLOW UP:  °If any biopsies were taken you will be contacted by phone or by letter within the next 1-3 weeks. Call [phone]  if you have not heard about the biopsies in 3 weeks.  °Please also call with any specific questions about appointments or follow up tests. ° °

## 2016-02-26 NOTE — Telephone Encounter (Signed)
-----   Message from Irene Shipper, MD sent at 02/26/2016 12:31 PM EST ----- Regarding: Needs stat pulmonary consult Bradley Meadows, This patient was set up to see me today for endoscopy with the concern of esophageal mass by chest CT. No esophageal mass on endoscopy. 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 He needs to see pulmonary STAT. Please arrange. I will inform the patient and his daughter. Thanks jp

## 2016-02-26 NOTE — Transfer of Care (Signed)
Immediate Anesthesia Transfer of Care Note  Patient: Bradley Meadows  Procedure(s) Performed: Procedure(s): ESOPHAGOGASTRODUODENOSCOPY (EGD) WITH PROPOFOL (N/A)  Patient Location: PACU  Anesthesia Type:MAC  Level of Consciousness:  sedated, patient cooperative and responds to stimulation  Airway & Oxygen Therapy:Patient Spontanous Breathing and Patient connected to face mask oxgen  Post-op Assessment:  Report given to PACU RN and Post -op Vital signs reviewed and stable  Post vital signs:  Reviewed and stable  Last Vitals:  Vitals:   02/26/16 1121  BP: (!) 120/57  Pulse: 67  Resp: 12  Temp: 35.7 C    Complications: No apparent anesthesia complications

## 2016-02-26 NOTE — Telephone Encounter (Signed)
Patient has been scheduled to se Dr. Annamaria Boots tomorrow at 11:00.  His daughter [name] has been notified to arrive at 10:40 in Pulmonary

## 2016-02-26 NOTE — H&P (View-Only) (Signed)
Dr. Carlean Purl supervising. Sent to me thinking I was the hospital doctor next week. However, it is Dr. Havery Moros. In any event, Arranging hospital EGD with Dr. Havery Moros (I will forward him this note)

## 2016-02-26 NOTE — Anesthesia Preprocedure Evaluation (Addendum)
Anesthesia Evaluation  Patient identified by MRN, date of birth, ID band Patient awake    Reviewed: Allergy & Precautions, NPO status , Patient's Chart, lab work & pertinent test results, reviewed documented beta blocker date and time   History of Anesthesia Complications Negative for: history of anesthetic complications  Airway Mallampati: II  TM Distance: >3 FB Neck ROM: Full    Dental  (+) Upper Dentures, Lower Dentures   Pulmonary shortness of breath and Long-Term Oxygen Therapy, COPD,  COPD inhaler and oxygen dependent, former smoker,    breath sounds clear to auscultation       Cardiovascular hypertension, Pt. on medications and Pt. on home beta blockers + CAD and +CHF   Rhythm:Regular Rate:Normal     Neuro/Psych    GI/Hepatic Neg liver ROS, GERD  Medicated and Controlled,  Endo/Other  negative endocrine ROS  Renal/GU negative Renal ROS     Musculoskeletal  (+) Arthritis ,   Abdominal   Peds  Hematology  (+) anemia ,   Anesthesia Other Findings   Reproductive/Obstetrics                            Anesthesia Physical Anesthesia Plan  ASA: III  Anesthesia Plan: MAC   Post-op Pain Management:    Induction: Intravenous  Airway Management Planned: Natural Airway, Nasal Cannula and Simple Face Mask  Additional Equipment: None  Intra-op Plan:   Post-operative Plan:   Informed Consent: I have reviewed the patients History and Physical, chart, labs and discussed the procedure including the risks, benefits and alternatives for the proposed anesthesia with the patient or authorized representative who has indicated his/her understanding and acceptance.   Dental advisory given  Plan Discussed with: CRNA and Surgeon  Anesthesia Plan Comments:         Anesthesia Quick Evaluation

## 2016-02-26 NOTE — Op Note (Signed)
[name] Patient Name: Bradley Meadows Procedure Date: 02/26/2016 MRN: [id number] Attending MD: Docia Chuck. Henrene Pastor , MD Date of Birth: 1940/12/13 CSN: [id number] Age: 76 Admit Type: Outpatient Procedure:                Upper GI endoscopy Indications:              Abnormal CT of the GI tract/chest. Question of                            esophageal carcinoma raised Providers:                Docia Chuck. Henrene Pastor, MD, Hilma Favors, RN, Elspeth Cho Tech., Technician, Virgia Land, CRNA Referring MD:             Cammie Mcgee. Dennard Schaumann, M.D. Medicines:                Monitored Anesthesia Care Complications:            No immediate complications. Estimated Blood Loss:     Estimated blood loss: none. Procedure:                Pre-Anesthesia Assessment:                           - Prior to the procedure, a History and Physical                            was performed, and patient medications and                            allergies were reviewed. The patient's tolerance of                            previous anesthesia was also reviewed. The risks                            and benefits of the procedure and the sedation                            options and risks were discussed with the patient.                            All questions were answered, and informed consent                            was obtained. Prior Anticoagulants: The patient has                            taken no previous anticoagulant or antiplatelet                            agents. ASA Grade Assessment: III - A patient with  severe systemic disease. After reviewing the risks                            and benefits, the patient was deemed in                            satisfactory condition to undergo the procedure.                           After obtaining informed consent, the endoscope was                            passed under direct vision. Throughout the                        procedure, the patient's blood pressure, pulse, and                            oxygen saturations were monitored continuously. The                            EG-[id number] ([id number]) scope was introduced through the                            mouth, and advanced to the second part of duodenum.                            The upper GI endoscopy was accomplished without                            difficulty. The patient tolerated the procedure                            well. Scope In: Scope Out: Findings:      The esophagus was normal, except for a sensation of circumferential       extrinsic compression at approximately 30 cm from the incisors (just       proximal to midesophagus).      The stomach was normal, except for the presence of retained gastric       contents which obscured the fundus.      The examined duodenum was normal.      The cardia and gastric fundus were normal on retroflexion. Impression:               - Normal esophagus, with the exception of the                            suggestion of extrinsic compression at 30 cm as                            described.                           - Normal stomach.                           -  Normal examined duodenum.                           - No specimens collected.                           - Case reviewed with radiology post EGD. 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                           - Resume previous diet.                           - Continue present medications. Procedure Code(s):        --- Professional ---                           [phone], Esophagogastroduodenoscopy, flexible,                            transoral; diagnostic,  including collection of                            specimen(s) by brushing or washing, when performed                            (separate procedure) Diagnosis Code(s):        --- Professional ---                           R93.3, Abnormal findings on diagnostic imaging of                            other parts of digestive tract CPT copyright 2016 American Medical Association. All rights reserved. The codes documented in this report are preliminary and upon coder review may  be revised to meet current compliance requirements. Docia Chuck. Henrene Pastor, MD 02/26/2016 12:27:12 PM This report has been signed electronically. Number of Addenda: 0

## 2016-02-26 NOTE — Telephone Encounter (Signed)
duplicate

## 2016-02-26 NOTE — Telephone Encounter (Signed)
-----   Message from Irene Shipper, MD sent at 02/26/2016 12:31 PM EST ----- Regarding: Needs stat pulmonary consult Vaughan Basta, This patient was set up to see me today for endoscopy with the concern of esophageal mass by chest CT. No esophageal mass on endoscopy. 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 He needs to see pulmonary STAT. Please arrange. I will inform the patient and his daughter. Thanks jp

## 2016-02-26 NOTE — Interval H&P Note (Signed)
History and Physical Interval Note:  02/26/2016 11:52 AM  Bradley Meadows  has presented today for surgery, with the diagnosis of esoph. mass  The various methods of treatment have been discussed with the patient and family. After consideration of risks, benefits and other options for treatment, the patient has consented to  Procedure(s): ESOPHAGOGASTRODUODENOSCOPY (EGD) WITH PROPOFOL (N/A) as a surgical intervention .  The patient's history has been reviewed, patient examined, no change in status, stable for surgery.  I have reviewed the patient's chart and labs.  Questions were answered to the patient's satisfaction.     Scarlette Shorts

## 2016-02-26 NOTE — Anesthesia Postprocedure Evaluation (Addendum)
Anesthesia Post Note  Patient: Bradley Meadows  Procedure(s) Performed: Procedure(s) (LRB): ESOPHAGOGASTRODUODENOSCOPY (EGD) WITH PROPOFOL (N/A)  Patient location during evaluation: Endoscopy Anesthesia Type: MAC Level of consciousness: awake and alert Pain management: pain level controlled Vital Signs Assessment: post-procedure vital signs reviewed and stable Respiratory status: spontaneous breathing, nonlabored ventilation, respiratory function stable and patient connected to nasal cannula oxygen Cardiovascular status: stable and blood pressure returned to baseline Anesthetic complications: no       Last Vitals:  Vitals:   02/26/16 1121 02/26/16 1217  BP: (!) 120/57 (!) 108/42  Pulse: 67 71  Resp: 12 14  Temp: 36.7 C     Last Pain:  Vitals:   02/26/16 1121  TempSrc: Oral                 Francies Inch

## 2016-02-27 ENCOUNTER — Ambulatory Visit (INDEPENDENT_AMBULATORY_CARE_PROVIDER_SITE_OTHER): Payer: Medicare Other | Admitting: Internal Medicine

## 2016-02-27 ENCOUNTER — Encounter (HOSPITAL_COMMUNITY): Payer: Self-pay | Admitting: Internal Medicine

## 2016-02-27 VITALS — BP 126/78 | HR 71 | Ht 68.0 in | Wt 193.0 lb

## 2016-02-27 DIAGNOSIS — K229 Disease of esophagus, unspecified: Secondary | ICD-10-CM | POA: Diagnosis not present

## 2016-02-27 DIAGNOSIS — J449 Chronic obstructive pulmonary disease, unspecified: Secondary | ICD-10-CM

## 2016-02-27 DIAGNOSIS — K228 Other specified diseases of esophagus: Secondary | ICD-10-CM

## 2016-02-27 DIAGNOSIS — I2510 Atherosclerotic heart disease of native coronary artery without angina pectoris: Secondary | ICD-10-CM | POA: Diagnosis not present

## 2016-02-27 DIAGNOSIS — K2289 Other specified disease of esophagus: Secondary | ICD-10-CM

## 2016-02-27 MED ORDER — ALPRAZOLAM 0.5 MG PO TABS: ORAL_TABLET | ORAL | 0 refills | Status: DC

## 2016-02-27 NOTE — Assessment & Plan Note (Signed)
Moderately severe obstructive airways disease. Bronchodilators won't help with mechanical compression, but should be continued for his COPD.

## 2016-02-27 NOTE — Assessment & Plan Note (Signed)
This problem noted and recent cardiac studies noted. Followed by cardiology

## 2016-02-27 NOTE — Progress Notes (Signed)
02/27/2016-76 year old male former smoker referred courtesy of Dr. Henrene Pastor GI-"growth in throat" Lesion could not apparently be reached from endoesophageal scope yesterday. O2 2L [employer]-Sleep and as needed       here with daughter Medical problems include anemia, CAD, CHF/EF 45-50 percent, COPD, GERD, HBP Bradley Meadows stopped smoking last year. Gradually worsening hoarseness, 5 or 6 pounds weight loss, some difficulty swallowing solid foods 3 months. Cough productive mostly clear mucus with occasional trace bloody material like "tissue" 1 month. Using Stiolto Respimat, Combivent- may help some. Upper endoscopy showed some narrowing. CT chest 02/13/2016-IMPRESSION:  I reviewed these images with him and his daughter noting esophageal mass effect with some compression at the tracheal bifurcation. 1. 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 Associated mass effect on and moderate narrowing of the lower trachea and bilateral mainstem bronchi. These results will be called to the ordering clinician or representative by the Radiologist Assistant, and communication documented in the PACS or zVision Dashboard. 2. Subcarinal and gastrohepatic ligament adenopathy. 3. 5 mm subpleural right lower lobe nodule. Attention on followup exams is warranted. 4. Aortic atherosclerosis (ICD10-170.0). Three-vessel coronary artery calcification. 5. Cholelithiasis. Nuclear cardiac study 02/19/2016-low risk stress test normal LV function Office Spirometry-02/27/2016-moderately severe obstructive airways disease. FVC 3.00/78%, FEV1 1.57/57%, ratio or 0.52, FEF 25-75% 0.91/46%.  Prior to Admission medications   Medication Sig Start Date End Date Taking? Authorizing Provider  albuterol-ipratropium (COMBIVENT) 18-103 MCG/ACT inhaler Inhale 2 puffs into the lungs every 6 (six) hours as needed for wheezing or shortness of breath.   Yes Historical Provider, MD  aspirin 81 MG tablet Take 1 tablet (81 mg  total) by mouth daily. 08/29/10  Yes Merlene Laughter [place], MD  atorvastatin (LIPITOR) 40 MG tablet TAKE 1 TABLET(40 MG) BY MOUTH DAILY 10/09/15  Yes Susy Frizzle, MD  ferrous sulfate 325 (65 FE) MG tablet TAKE 1 TABLET BY MOUTH EVERY DAY WITH BREAKFAST 06/14/15  Yes Susy Frizzle, MD  HYDROcodone-acetaminophen (NORCO/VICODIN) 5-325 MG tablet TK 1 T PO  BID. MUST LAST 30 DAYS Patient taking differently: Take 1 tablet by mouth every 12 (twelve) hours as needed for moderate pain. TK 1 T PO  BID. MUST LAST 30 DAYS 01/31/16  Yes Susy Frizzle, MD  ipratropium-albuterol (DUONEB) 0.5-2.5 (3) MG/3ML SOLN USE 1 VIAL VIA NEBULIZER EVERY 6 HOURS AS NEEDED Patient taking differently: USE 1 VIAL VIA NEBULIZER EVERY 6 HOURS AS NEEDED FOR SHORTNESS OF BREATH OR WHEEZING 12/26/15  Yes Susy Frizzle, MD  lisinopril-hydrochlorothiazide (PRINZIDE,ZESTORETIC) 20-12.5 MG tablet TAKE 1 TABLET BY MOUTH DAILY 01/07/16  Yes Susy Frizzle, MD  metoprolol tartrate (LOPRESSOR) 25 MG tablet Take 1.5 tablets (37.5 mg total) by mouth 2 (two) times daily. Patient taking differently: Take 25 mg by mouth 2 (two) times daily.  10/01/15  Yes Mihai Croitoru, MD  Multiple Vitamin (MULTIVITAMIN) tablet Take 1 tablet by mouth daily. Centrum Silver   Yes Historical Provider, MD  omeprazole (PRILOSEC) 20 MG capsule TAKE ONE CAPSULE BY MOUTH EVERY DAY 12/12/15  Yes Susy Frizzle, MD  Tiotropium Bromide-Olodaterol (STIOLTO RESPIMAT) 2.5-2.5 MCG/ACT AERS Inhale 2 puffs into the lungs daily. 04/19/15  Yes Susy Frizzle, MD  ALPRAZolam Duanne Moron) 0.5 MG tablet 1 tab 30-or 45  minutes before exam as needed for anxiety 02/27/16   Deneise Lever, MD   Past Medical History:  Diagnosis Date  . CHF (congestive heart failure) (HCC)    ef=45-50%  . COPD (chronic obstructive pulmonary disease) ([place])  home O2  . GERD (gastroesophageal reflux disease)    ulcers  . Hyperlipidemia   . Hypertension    Past Surgical History:  Procedure  Laterality Date  . ESOPHAGOGASTRODUODENOSCOPY (EGD) WITH PROPOFOL N/A 02/26/2016   Procedure: ESOPHAGOGASTRODUODENOSCOPY (EGD) WITH PROPOFOL;  Surgeon: Irene Shipper, MD;  Location: [facility];  Service: Endoscopy;  Laterality: N/A;  . EYE SURGERY     cataracts  . HERNIA REPAIR    . JOINT REPLACEMENT     knee both   Family History  Problem Relation Age of Onset  . Arthritis Mother   . COPD Mother   . Alcohol abuse Father   . Breast cancer Sister   . Heart disease Paternal Grandfather   . Hyperlipidemia Sister    Social History   Social History  . Marital status: Divorced    Spouse name: N/A  . Number of children: 3  . Years of education: 9   Occupational History  . RetiredAnimal nutritionist Retired   Social History Main Topics  . Smoking status: Former Smoker    Packs/day: 1.00    Types: Cigarettes    Quit date: 09/29/2015  . Smokeless tobacco: Never Used  . Alcohol use No  . Drug use: No  . Sexual activity: Not Currently   Other Topics Concern  . Not on file   Social History Narrative   Grew up in [place] area.    Left school after 9th grade. No father and had to work.    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       Health Care POA:    Emergency Contact: sister, Hector Brunswick (h) [phone]   End of Life Plan:    Who lives with you: self   Any pets: Lorrin Goodell   Diet: Pt limits sugars and starches and focuses on vegetables an protein. Pt has lost 50 lbs over several years.   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   Seatbelts: Pt reports wearing seatbelt when in vehicle.   Sun Exposure/Protection: Pt does not use sun protectin.   Hobbies: Racing, TV, gardening, Architect         ROS-see HPI   Negative unless "+" Constitutional:    +weight loss, night sweats, fevers, chills, fatigue, lassitude. HEENT:    headaches, +difficulty swallowing, tooth/dental problems, sore throat,        sneezing, itching, ear ache, nasal congestion, post nasal drip, snoring CV:    chest pain, orthopnea, PND, swelling in lower extremities, anasarca,                                                    dizziness, palpitations Resp:  + shortness of breath with exertion or at rest.               + productive cough,   non-productive cough, +coughing up of blood.              change in color of mucus.  wheezing.   Skin:    rash or lesions. GI:  No-   heartburn, indigestion, abdominal pain, nausea, vomiting, diarrhea,                 change in bowel habits, loss of appetite GU: dysuria, change in color of urine, no urgency  or frequency.   flank pain. MS:   joint pain, stiffness, decreased range of motion, back pain. Neuro-     nothing unusual Psych:  change in mood or affect.  depression or anxiety.   memory loss.  OBJ- Physical Exam General- Alert, Oriented, Affect-appropriate, Distress- none acute, pleasant Skin- rash-none, lesions- none, excoriation- none Lymphadenopathy- none Head- atraumatic            Eyes- Gross vision intact, PERRLA, conjunctivae and secretions clear            Ears- Hearing, canals-normal            Nose- Clear, no-Septal dev, mucus, polyps, erosion, perforation             Throat- Mallampati III-IV , mucosa clear , drainage- none, tonsils- atrophic, + hoarse Neck- flexible , trachea midline, + stridor , thyroid nl, carotid no bruit Chest - symmetrical excursion , unlabored           Heart/CV- RRR , no murmur , no gallop  , no rub, nl s1 s2                           - JVD- none , edema- none, stasis changes- none, varices- none           Lung- + transmitted upper airway noise, wheeze- none, cough- none , dullness-none, rub- none           Chest wall-  Abd-  Br/ Gen/ Rectal- Not done, not indicated Extrem- cyanosis- none, clubbing, none, atrophy- none, strength- nl Neuro- grossly intact to observation. Palate lifts normally

## 2016-02-27 NOTE — Assessment & Plan Note (Signed)
CT would be consistent with an esophageal mass however only extrinsic mass effect was appreciated on endoscopy apparently. He describes blood in sputum suggesting airway/tracheal involvement, perhaps invasion of the airway from esophageal mass. I have reviewed the images with the patient and his daughter and discussed possible bronchoscopy. I asked my associate Dr Lamonte Sakai to review the images and consider bronchoscopy for biopsy. Otherwise we will refer to thoracic surgery. Office spirometry was done today and PET scan is being scheduled.

## 2016-02-27 NOTE — Patient Instructions (Addendum)
Order- office spirometry   Dx COPD mixed type, esophageal mass  Order- schedule PET scan neck to thigh    Dx esophageal mass  I will review your xrays with my associate to consider whether we can do bronchoscopy. If not, then we will help get the opinion of a thoracic surgeon to help.   Ok to continue with your inhalers  I suggest you supplement your diet with Ensure or Boost to help keep your weight up.  Please call if we can help

## 2016-02-29 NOTE — Progress Notes (Signed)
Patient ID: Bradley Meadows, male   DOB: Feb 14, 1940, 76 y.o.   MRN: [id number] HISTORY AND PHYSICAL EXAM PRE-PROCEDURE ENDOSCOPY  Patient recently seen in the office on 02/14/2016 regarding possible esophageal mass. As below. Chief Complaint: Esophageal Mass, Dysphagia, Hoarseness  HPI:  Bradley Meadows is a 76 year old Caucasian male with a past medical history of COPD on oxygen at home, CHF with an EF of 45-50% on last echo 08/30/15, as well as hyperlipidemia and hypertension who was referred to me by Bradley Frizzle, MD for a complaint of esophageal mass, hoarseness and dysphagia.    Recent CT of the chest on 02/13/16 for cough and hemoptysis as well as shortness of breath on exertion had finding of an esophageal mass highly worrisome for esophageal carcinoma. There was associated mass effect causing moderate narrowing of the lower trachea and bilateral mainstem bronchi. Subcarinal and gastrohepatic ligament adenopathy. 5 mm subpleural right lower lobe nodule. Aortic atherosclerosis with three-vessel coronary artery calcification and cholelithiasis.   Today, the patient presents to clinic accompanied by his daughter, who is a Marine scientist with the [employer], she does assist with this history. He explains that he had an imaging study yesterday and was called urgently by his doctor that afternoon for an appointment with [place] due to finding of a "esophageal mass". See study above. Patient does tell me he has had intermittent dysphagia symptoms while eating solid foods over the past few years, but none currently. He has also used Prilosec 20 mg once daily for "a long time" for reflux symptoms. Patient does tell me he has developed a new hoarseness over the past 2-3 months. He has also had a weight loss of around 9 pounds in the past month. He does admit to being a smoker for more than 25-30 years, he would smoke at least 2-3 packs a day. Patient stopped 9 years ago but then re-started 2 years ago again. He has  recently quit smoking again in November 2017.   Past medical history significant for multiple hernia repairs. Today patient tells me that he feels like he has a hernia now as his "groin is bigger than normal". He would prefer [place] not look at this today or for this to be evaluated any further until the above is worked out.   Patient denies fever, chills, blood in his stool, melena, change in bowel habits, fatigue, nausea, vomiting or symptoms that awaken him at night.       Past Medical History:  Diagnosis Date  . CHF (congestive heart failure) (HCC)    ef=45-50%  . COPD (chronic obstructive pulmonary disease) ([place])   . GERD (gastroesophageal reflux disease)    ulcers  . Hyperlipidemia   . Hypertension          Past Surgical History:  Procedure Laterality Date  . EYE SURGERY     cataracts  . JOINT REPLACEMENT     knee both          Current Outpatient Prescriptions  Medication Sig Dispense Refill  . albuterol-ipratropium (COMBIVENT) 18-103 MCG/ACT inhaler Inhale 2 puffs into the lungs every 6 (six) hours as needed for wheezing or shortness of breath.    [name] aspirin 81 MG tablet Take 1 tablet (81 mg total) by mouth daily. 1 tablet 3  . atorvastatin (LIPITOR) 40 MG tablet TAKE 1 TABLET(40 MG) BY MOUTH DAILY 90 tablet 1  . COMBIVENT RESPIMAT 20-100 MCG/ACT AERS respimat INHALE 1 PUFF BY MOUTH EVERY 6 HOURS AS NEEDED FOR  WHEEZING 4 g 3  . ferrous sulfate 325 (65 FE) MG tablet TAKE 1 TABLET BY MOUTH EVERY DAY WITH BREAKFAST 90 tablet 3  . furosemide (LASIX) 40 MG tablet TAKE 1 TABLET BY MOUTH ONCE DAILY as needed for swelling 90 tablet 0  . HYDROcodone-acetaminophen (NORCO/VICODIN) 5-325 MG tablet TK 1 T PO  BID. MUST LAST 30 DAYS 60 tablet 0  . ipratropium-albuterol (DUONEB) 0.5-2.5 (3) MG/3ML SOLN USE 1 VIAL VIA NEBULIZER EVERY 6 HOURS AS NEEDED 270 mL 0  . lisinopril-hydrochlorothiazide (PRINZIDE,ZESTORETIC) 20-12.5 MG tablet TAKE 1 TABLET BY MOUTH DAILY 90 tablet 3   . metoprolol tartrate (LOPRESSOR) 25 MG tablet Take 1.5 tablets (37.5 mg total) by mouth 2 (two) times daily. 270 tablet 3  . Multiple Vitamin (MULTIVITAMIN) tablet Take 1 tablet by mouth daily. Centrum Silver    . omeprazole (PRILOSEC) 20 MG capsule TAKE ONE CAPSULE BY MOUTH EVERY DAY 90 capsule 3  . Spacer/Aero-Holding Chambers DEVI 1 Device by Does not apply route as needed. 1 each 0  . Tiotropium Bromide-Olodaterol (STIOLTO RESPIMAT) 2.5-2.5 MCG/ACT AERS Inhale 2 puffs into the lungs daily. 1 Inhaler 11   No current facility-administered medications for this visit.        Allergies as of 02/14/2016  . (No Known Allergies)         Family History  Problem Relation Age of Onset  . Arthritis Mother   . COPD Mother   . Alcohol abuse Father   . Breast cancer Sister   . Heart disease Paternal Grandfather   . Hyperlipidemia Sister     Social History        Social History  . Marital status: Divorced    Spouse name: N/A  . Number of children: 3  . Years of education: 9       Occupational History  . RetiredAnimal nutritionist Retired        Social History Main Topics  . Smoking status: Former Smoker    Packs/day: 1.00    Types: Cigarettes    Quit date: 09/29/2015  . Smokeless tobacco: Never Used  . Alcohol use No  . Drug use: No  . Sexual activity: Not Currently       Other Topics Concern  . Not on file      Social History Narrative   Grew up in [place] area.    Left school after 9th grade. No father and had to work.    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       Health Care POA:    Emergency Contact: sister, Bradley Meadows (h) [phone]   End of Life Plan:    Who lives with you: self   Any pets: Bradley Meadows   Diet: Pt limits sugars and starches and focuses on vegetables an protein. Pt has lost 50 lbs over several years.   Exercise: Pt has not regular exercise routine but still does construction, gardening, and  walks dog daily.   Seatbelts: Pt reports wearing seatbelt when in vehicle.   Sun Exposure/Protection: Pt does not use sun protectin.   Hobbies: Racing, TV, gardening, Architect          Review of Systems:     Constitutional: Positive for weight loss of 9 pounds in the past month No fever or chills HEENT: Eyes: No change in vision               Ears, Nose, Throat:  No change in hearing  Skin: No rash Cardiovascular: No  chest pain Respiratory: Positive for chronic shortness of breath and cough as well as some hemoptysis  Gastrointestinal: See HPI and otherwise negative Genitourinary: No dysuria or change in urinary frequency Neurological: No headache Musculoskeletal: No new muscle or joint pain Hematologic: No bruising Psychiatric: No history of depression or anxiety   Physical Exam:  Vital signs: BP (!) 92/54   Pulse (!) 58   Ht 5' 8.5" (1.74 m)   Wt 196 lb (88.9 kg)   BMI 29.36 kg/m   Constitutional:   Pleasant Elderly, chronically ill appearing Caucasian male appears to be in NAD, Well developed, Well nourished, alert and cooperative Head:  Normocephalic and atraumatic. Eyes:   PEERL, EOMI. No icterus. Conjunctiva pink. Ears:  Normal auditory acuity. Neck:  Supple Throat: Oral cavity and pharynx without inflammation, swelling or lesion.  Respiratory: Respirations even and unlabored. Bilateral wheezes, decreased breath sounds Cardiovascular: Normal S1, S2. No MRG. Regular rate and rhythm. Positive for peripheral edema Gastrointestinal:  Soft, nondistended, nontender. No rebound or guarding. Normal bowel sounds. No appreciable masses or hepatomegaly. Rectal:  Not performed.  Msk:  Symmetrical without gross deformities. Without edema, no deformity or joint abnormality.  Neurologic:  Alert and  oriented x4;  grossly normal neurologically.  Skin:   Dry and intact without significant lesions or rashes. Psychiatric:Demonstrates good judgement and reason without  abnormal affect or behaviors.  RELEVANT LABS AND IMAGING: CBC Labs(Brief)     Component Value Date/Time   WBC 7.9 08/14/2015 1255   RBC 4.33 08/14/2015 1255   HGB 13.6 08/14/2015 1255   HCT 38.8 08/14/2015 1255   PLT 205 08/14/2015 1255   MCV 89.6 08/14/2015 1255   MCH 31.4 08/14/2015 1255   MCHC 35.1 08/14/2015 1255   RDW 13.9 08/14/2015 1255   LYMPHSABS 2,133 08/14/2015 1255   MONOABS 632 08/14/2015 1255   EOSABS 158 08/14/2015 1255   BASOSABS 0 08/14/2015 1255      CMP     Labs(Brief)          Component Value Date/Time   NA 127 (L) 08/14/2015 1255   K 4.1 08/14/2015 1255   CL 90 (L) 08/14/2015 1255   CO2 22 08/14/2015 1255   GLUCOSE 93 08/14/2015 1255   BUN 8 08/14/2015 1255   CREATININE 0.81 08/14/2015 1255   CALCIUM 9.2 08/14/2015 1255   PROT 6.0 (L) 08/14/2015 1255   ALBUMIN 3.9 08/14/2015 1255   AST 16 08/14/2015 1255   ALT 12 08/14/2015 1255   ALKPHOS 70 08/14/2015 1255   BILITOT 0.8 08/14/2015 1255   GFRNONAA 87 08/14/2015 1255   GFRAA >89 08/14/2015 1255     CT CHEST WITH CONTRAST 02/13/16  TECHNIQUE: Multidetector CT imaging of the chest was performed during intravenous contrast administration.  CONTRAST: 75 cc Isovue-300.  Creatinine was obtained on site at [facility] at [address]Results: Creatinine 0.7 mg/dL.  COMPARISON: None.  FINDINGS: Cardiovascular: Atherosclerotic calcification of the arterial vasculature, including three-vessel involvement of the coronary arteries. Heart is within normal limits for size. No pericardial effusion.  Mediastinum/Nodes: A soft tissue mass is seen in the esophagus, measuring 3.6 x 3.6 cm, for a length of approximately 3.6 cm. The esophagus is dilated proximally. There is mass effect on and moderate narrowing of the lower trachea and mainstem bronchi bilaterally. Subcarinal lymph node measures 1.4 cm. There are additional subcentimeter  mediastinal, right hilar and periesophageal lymph nodes. No axillary adenopathy.  Lungs/Pleura: Mild centrilobular emphysema. Mild scarring in the lower lobes.  5 mm subpleural posterior right lower lobe nodule (series 5, image 86). No pleural fluid. Narrowing of the lower trachea and bilateral mainstem bronchi is again mentioned due to compression by an extrinsic esophageal mass. Narrowing of the right mainstem bronchus is severe.  Upper Abdomen: Visualized portion of the liver is unremarkable. A stone is seen in the gallbladder. Visualized portions of the adrenal glands, kidneys, spleen, pancreas, stomach and bowel are grossly unremarkable. Gastrohepatic ligament lymph node measures 1.4 cm (series 2, image 139).  Musculoskeletal: No worrisome lytic or sclerotic lesions.  IMPRESSION: 1. 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 Associated mass effect on and moderate narrowing of the lower trachea and bilateral mainstem bronchi. These results will be called to the ordering clinician or representative by the Radiologist Assistant, and communication documented in the PACS or zVision Dashboard. 2. Subcarinal and gastrohepatic ligament adenopathy. 3. 5 mm subpleural right lower lobe nodule. Attention on followup exams is warranted. 4. Aortic atherosclerosis (ICD10-170.0). Three-vessel coronary artery calcification. 5. Cholelithiasis.   Electronically Signed By: Lorin Picket M.D. On: 02/13/2016 13:28  Assessment: 1. Esophageal mass: Seen at time of recent CT of the chest on 02/13/16, see report above; highly concerning for esophageal carcinoma 2. Dysphagia: Intermittent per patient, currently no symptoms, likely with above 3. Hoarseness: With above  Plan: 1. Patient will proceed with emergent EGD for further evaluation. Discussed risks, benefits, limitations and alternatives the patient agrees to proceed. This will likely be scheduled with Dr.  Henrene Pastor in the hospital next week as patient is on home O2. Patient did decline an appointment tomorrow with Dr. Hilarie Fredrickson as he has a "very important" appointment with the cardiologist. He is aware of the findings on CT and that this could represent cancer, but would prefer to wait until next week. Preferably until after next Wednesday for EGD. 2. We will call the patient with an appointment. 3. Patient to continue his Prilosec 20 mg daily, 30-60 minutes before breakfast 4. Encouraged the patient to continue his tobacco cessation 5. Patient to follow up in clinic per Dr. Celesta Aver recommendations after time of EGD with Dr. Henrene Pastor.  No interval issues. No new complaints. Wife at bedside. For EGD today (02/26/2016).The nature of the procedure, as well as the risks, benefits, and alternatives were carefully and thoroughly reviewed with the patient. Ample time for discussion and questions allowed. The patient understood, was satisfied, and agreed to proceed.  Docia Chuck. Geri Seminole., M.D. [facility] Division of Gastroenterology

## 2016-03-04 ENCOUNTER — Institutional Professional Consult (permissible substitution): Payer: Medicare Other | Admitting: Pulmonary Disease

## 2016-03-07 ENCOUNTER — Telehealth: Payer: Self-pay | Admitting: Family Medicine

## 2016-03-07 NOTE — Telephone Encounter (Signed)
Ok, sure with 3 refills

## 2016-03-07 NOTE — Telephone Encounter (Signed)
Ok to refill 

## 2016-03-07 NOTE — Telephone Encounter (Signed)
Pt needs refill on hydrocodone.  °

## 2016-03-10 MED ORDER — HYDROCODONE-ACETAMINOPHEN 5-325 MG PO TABS
1.0000 | ORAL_TABLET | Freq: Two times a day (BID) | ORAL | 0 refills | Status: DC
Start: 2016-04-07 — End: 2016-03-10

## 2016-03-10 MED ORDER — HYDROCODONE-ACETAMINOPHEN 5-325 MG PO TABS: 1.0000 | ORAL_TABLET | Freq: Two times a day (BID) | ORAL | 0 refills | Status: DC

## 2016-03-10 MED ORDER — HYDROCODONE-ACETAMINOPHEN 5-325 MG PO TABS
1.0000 | ORAL_TABLET | Freq: Two times a day (BID) | ORAL | 0 refills | Status: DC
Start: 2016-03-10 — End: 2016-03-10

## 2016-03-10 NOTE — Telephone Encounter (Signed)
RX printed x 3 months, left up front and patient aware to pick up  

## 2016-03-11 ENCOUNTER — Ambulatory Visit (HOSPITAL_COMMUNITY)
Admission: RE | Admit: 2016-03-11 | Discharge: 2016-03-11 | Disposition: A | Discharge status: Home / Self Care | Payer: Medicare Other | Source: Ambulatory Visit | Attending: Internal Medicine | Admitting: Internal Medicine

## 2016-03-11 DIAGNOSIS — R911 Solitary pulmonary nodule: Secondary | ICD-10-CM | POA: Insufficient documentation

## 2016-03-11 DIAGNOSIS — R222 Localized swelling, mass and lump, trunk: Secondary | ICD-10-CM | POA: Diagnosis not present

## 2016-03-11 DIAGNOSIS — K229 Disease of esophagus, unspecified: Secondary | ICD-10-CM | POA: Insufficient documentation

## 2016-03-11 DIAGNOSIS — Z7982 Long term (current) use of aspirin: Secondary | ICD-10-CM | POA: Insufficient documentation

## 2016-03-11 DIAGNOSIS — Z79899 Other long term (current) drug therapy: Secondary | ICD-10-CM | POA: Diagnosis not present

## 2016-03-11 DIAGNOSIS — K2289 Other specified disease of esophagus: Secondary | ICD-10-CM

## 2016-03-11 DIAGNOSIS — K228 Other specified diseases of esophagus: Secondary | ICD-10-CM

## 2016-03-11 LAB — GLUCOSE, CAPILLARY: Glucose-Capillary: 110 mg/dL — ABNORMAL HIGH (ref 65–99)

## 2016-03-11 MED ORDER — FLUDEOXYGLUCOSE F - 18 (FDG) INJECTION
9.6900 | Freq: Once | INTRAVENOUS | Status: AC | PRN
  Administered 2016-03-11: 9.69 via INTRAVENOUS

## 2016-03-17 ENCOUNTER — Other Ambulatory Visit: Payer: Self-pay | Admitting: Family Medicine

## 2016-03-19 ENCOUNTER — Telehealth: Payer: Self-pay | Admitting: Internal Medicine

## 2016-03-19 ENCOUNTER — Telehealth: Payer: Self-pay | Admitting: Emergency Medicine

## 2016-03-19 DIAGNOSIS — R918 Other nonspecific abnormal finding of lung field: Secondary | ICD-10-CM

## 2016-03-19 NOTE — Telephone Encounter (Signed)
Called and spoke with pt. He asked that I call his daughter, [name]. lmtcb x1 for [employer].

## 2016-03-19 NOTE — Telephone Encounter (Signed)
I called daughter Ms Louretta Shorten to review PET scan- abnormal uptake in right vocal cord (hoarse x several weeks), in area of mass between esophagus and trachea suggested by radiologist to be bronchogenic Ca or lymphoma, and in a possibly inflammatory nodule RLL. Incidental observation of large inguinal hernia, atherosclerosis and emphysema. I answered her questions.  Dr Lamonte Sakai has already contacted Mr Mukai to schedule bronchoscopy next week and daughter understands there will be several days before any path result. From there anticipate referral to Oncology/ [place] if appropriate. She indicated understanding and agreement with plans and is encouraged to call us if needed to maintain communication.

## 2016-03-19 NOTE — Telephone Encounter (Signed)
Spoke with pt's daughter, [name]. She states that this EBUS has already been scheduled. Nothing further was needed at this time.

## 2016-03-19 NOTE — Telephone Encounter (Signed)
I have reviewed his PET scan from 2/6, discussed his case with Dr Annamaria Boots. I believe next most appropriate step is bronchoscopy with EBUS to biopsy the abnormality between his airway and esophagus.  Please let him know that we will start working on scheduling this. I will send scheduling referral to [facility]

## 2016-03-24 ENCOUNTER — Encounter (HOSPITAL_COMMUNITY)
Admission: RE | Admit: 2016-03-24 | Discharge: 2016-03-24 | Disposition: A | Discharge status: Home / Self Care | Payer: Medicare Other | Source: Ambulatory Visit | Attending: Emergency Medicine | Admitting: Emergency Medicine

## 2016-03-24 ENCOUNTER — Encounter (HOSPITAL_COMMUNITY): Payer: Self-pay

## 2016-03-24 DIAGNOSIS — E785 Hyperlipidemia, unspecified: Secondary | ICD-10-CM | POA: Diagnosis not present

## 2016-03-24 DIAGNOSIS — R918 Other nonspecific abnormal finding of lung field: Secondary | ICD-10-CM | POA: Diagnosis present

## 2016-03-24 DIAGNOSIS — C3482 Malignant neoplasm of overlapping sites of left bronchus and lung: Secondary | ICD-10-CM | POA: Diagnosis not present

## 2016-03-24 DIAGNOSIS — I5022 Chronic systolic (congestive) heart failure: Secondary | ICD-10-CM | POA: Diagnosis not present

## 2016-03-24 DIAGNOSIS — I110 Hypertensive heart disease with heart failure: Secondary | ICD-10-CM | POA: Diagnosis not present

## 2016-03-24 DIAGNOSIS — Z79899 Other long term (current) drug therapy: Secondary | ICD-10-CM | POA: Diagnosis not present

## 2016-03-24 DIAGNOSIS — Z7982 Long term (current) use of aspirin: Secondary | ICD-10-CM | POA: Diagnosis not present

## 2016-03-24 DIAGNOSIS — J449 Chronic obstructive pulmonary disease, unspecified: Secondary | ICD-10-CM | POA: Diagnosis not present

## 2016-03-24 DIAGNOSIS — Z01812 Encounter for preprocedural laboratory examination: Secondary | ICD-10-CM | POA: Insufficient documentation

## 2016-03-24 DIAGNOSIS — Z87891 Personal history of nicotine dependence: Secondary | ICD-10-CM | POA: Diagnosis not present

## 2016-03-24 DIAGNOSIS — I2510 Atherosclerotic heart disease of native coronary artery without angina pectoris: Secondary | ICD-10-CM | POA: Diagnosis not present

## 2016-03-24 DIAGNOSIS — K219 Gastro-esophageal reflux disease without esophagitis: Secondary | ICD-10-CM | POA: Diagnosis not present

## 2016-03-24 HISTORY — DX: Unspecified asthma, uncomplicated: J45.909

## 2016-03-24 HISTORY — DX: Ventricular premature depolarization: I49.3

## 2016-03-24 HISTORY — DX: Atherosclerotic heart disease of native coronary artery without angina pectoris: I25.10

## 2016-03-24 HISTORY — DX: Unspecified osteoarthritis, unspecified site: M19.90

## 2016-03-24 LAB — CBC
HCT: 33.9 % — ABNORMAL LOW (ref 39.0–52.0)
Hemoglobin: 11.6 g/dL — ABNORMAL LOW (ref 13.0–17.0)
MCH: 31.1 pg (ref 26.0–34.0)
MCHC: 34.2 g/dL (ref 30.0–36.0)
MCV: 90.9 fL (ref 78.0–100.0)
Platelets: 179 10*3/uL (ref 150–400)
RBC: 3.73 MIL/uL — ABNORMAL LOW (ref 4.22–5.81)
RDW: 13.6 % (ref 11.5–15.5)
WBC: 7.5 10*3/uL (ref 4.0–10.5)

## 2016-03-24 LAB — BASIC METABOLIC PANEL
Anion gap: 10 (ref 5–15)
BUN: 5 mg/dL — ABNORMAL LOW (ref 6–20)
CALCIUM: 9.2 mg/dL (ref 8.9–10.3)
CO2: 28 mmol/L (ref 22–32)
CREATININE: 0.73 mg/dL (ref 0.61–1.24)
Chloride: 89 mmol/L — ABNORMAL LOW (ref 101–111)
GFR calc non Af Amer: >60 mL/min (ref >60)
Glucose, Bld: 123 mg/dL — ABNORMAL HIGH (ref 65–99)
Potassium: 3.4 mmol/L — ABNORMAL LOW (ref 3.5–5.1)
SODIUM: 127 mmol/L — AB (ref 135–145)

## 2016-03-24 NOTE — Progress Notes (Signed)
Cardio is Dr. Jerilynn Mages Croitoru  [place] 02/2016 Pulmonary is Dr. Keturah Barre 02/2016 PCP is Dr. Alexis Goodell 02/2016 Denies CP, no murmur.  DOE , with hoarse voice.

## 2016-03-24 NOTE — Pre-Procedure Instructions (Signed)
    JODI KAPPES  03/24/2016      [facility] - [address], [address] [place] [address] Phone: [phone] Fax: [phone]    Your procedure is scheduled on Feb. 21st, Wednesday   Report to [facility] at 630 A.M.             (posted surgery time 8:30 - 9:30am)   Call this number if you have problems the MORNING of surgery:  [phone]   Remember:  Do not eat food or drink liquids after midnight Tuesday.   Take these medicines the morning of surgery with A SIP OF WATER : Combivent inhaler if needed- bring your inhalers with you on the day of surgery, Alprazolam (xanax) if needed, Hydrocodone (Norco) if needed, Duoneb neb if needed, Metoprolol tartrate (Lopressor), Omeprazole (Prilosec), Stiloto resprmat inhaler, tiotropium Bromide-Olodaterol (Stiolto respimat) inhaler   Stop taking ibuprofen, Advil, Motrin, Aleve, Herbal medications, Fihs oil, Vitamins, BC's, Goody's Stop/ take aspirin as directed by your Dr.   Lazaro Arms not wear jewelry - no rings or watches.  Do not wear lotions, colognes or deoderant.             Men may shave face and neck.   Do not bring valuables to the hospital.  [facility] is not responsible for any belongings or valuables.  Contacts, dentures or bridgework may not be worn into surgery.  Leave your suitcase in the car.  After surgery it may be brought to your room.  For patients admitted to the hospital, discharge time will be determined by your treatment team.  Patients discharged the day of surgery will not be allowed to drive home, and will need someone to stay with you for the first 24 hrs after surgery.  Please read over the following fact sheets that you were given. Pain Booklet and Surgical Site Infection Prevention

## 2016-03-25 ENCOUNTER — Encounter (HOSPITAL_COMMUNITY): Payer: Self-pay

## 2016-03-25 NOTE — Progress Notes (Signed)
Anesthesia Chart Review: Patient is a 76 year old male scheduled for video bronchoscopy with endobronchial ultrasound on 03/26/2016 by Dr. Lamonte Sakai. Recently he has had worsening hoarseness and dysphagia. Esophageal mass was noted on CT, but could not be reached by EGD, so the above procedure recommended.  History includes former smoker, HLD, HTN, CAD, chronic systolic CHF, PVCs, GERD, COPD on home O2, asthma, arthritis. 02/15/16 cardiology note mentions that patient has had chronic hyponatremia dating back to as far as 2008, hopefully not a sign of paraneoplastic SIADH.  BP 125/67   Pulse 69   Temp 36.5 C (Oral)   Resp 20   Ht '5\' 8"'$  (1.727 m)   Wt 181 lb (82.1 kg)   SpO2 98%   BMI 27.52 kg/m   PCP is Dr. Jenna Luo. Cardiologist is Dr. Arelia Sneddon Croitoru. Stress test recommended, which was low risk. Pulmonologist is Dr. Baird Lyons. GI is Dr. Scarlette Shorts.  Meds include Xanax, aspirin 81 mg, Lipitor, ferrous sulfate, Norco, Combivent, DuoNeb, lisinopril-HCTZ, Lopressor, Prilosec, Stiolto.  EKG 02/15/16 ([facility]): Sinus rhythm @ 74 bpm, frequent PVCs, possible left atrial enlargement, LAD.  Nuclear stress test 02/19/16:  The left ventricular ejection fraction is normal (55-65%).  Nuclear stress EF: 53%. Visually,  Defect 1: There is a medium defect of moderate severity present in the basal inferolateral and apical lateral location. The defect is consistent with artifact.  The study is normal.  This is a low risk study.  Echo 08/30/15: Study Conclusions - Left ventricle: The cavity size was normal. Systolic function was   mildly reduced. The estimated ejection fraction was in the range   of 45% to 50%. Diffuse hypokinesis. Doppler parameters are   consistent with abnormal left ventricular relaxation (grade 1   diastolic dysfunction). - Aortic valve: There was no stenosis. - Aorta: Borderline dilated ascending aorta/aortic root. Aortic   root dimension: 37 mm (ED). Ascending  aortic diameter: 37 mm (S). - Mitral valve: Moderately calcified annulus. There was trivial   regurgitation. - Left atrium: The atrium was mildly dilated. - Right ventricle: The cavity size was normal. Systolic function   was normal. - Tricuspid valve: Peak RV-RA gradient (S): 18 mm Hg. - Pulmonary arteries: PA peak pressure: 26 mm Hg (S). - Systemic veins: IVC measured 2.1 cm with normal respirophasic   variation, suggesting RA pressure 8 mmHg. Impressions: - The patient was in ventricular bigeminy, difficult to assess EF.   Estimate around 45-50%. Normal RV size and systolic function. No   significant valvular abnormalities.  Cardiac cath 06/01/06: IMPRESSION: 1. 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 40-50% mid LAD, DIAG 1 luminal irregularities, 40% proximal RCA, 30% mid RCA X 2 lesions. 2. Preserved left ventricular systolic function. RECOMMENDATIONS:  This patient will continue medical therapy.  PET Scan 03/11/16: IMPRESSION: - 3.9 cm hypermetabolic middle mediastinal mass along the posterior aspect of the distal trachea and subcarinal region. Differential considerations include primary bronchogenic carcinoma and lymphoma (esophageal carcinoma was excluded by recent upper endoscopy). Consider bronchoscopy for tissue diagnosis. - Small asymmetric focus of hypermetabolic activity in right hilum, and 1.4 cm hypermetabolic upper abdominal lymph noted gastrohepatic ligament. - Hypermetabolic 5 mm subpleural right lower lobe pulmonary nodule. Degree of hypermetabolic activity relative to small size of this nodule favors inflammatory etiology over neoplasm. Recommend continued attention recommended on follow-up CT. - Hypermetabolic activity in right vocal cord, without definite mass visualized by CT. Differential considerations include laryngeal carcinoma and left vocal cord  paralysis. Consider laryngoscopy for direct visualization . -  Incidental findings including aortic and coronary artery atherosclerosis, mild emphysema, large left inguinal hernia containing sigmoid colon, and cholelithiasis .  Chest CT w/ contrast 02/13/16: IMPRESSION: 1. 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 Associated mass effect on and moderate narrowing of the lower trachea and bilateral mainstem bronchi. These results will be called to the ordering clinician or representative by the Radiologist Assistant, and communication documented in the PACS or zVision Dashboard. 2. Subcarinal and gastrohepatic ligament adenopathy. 3. 5 mm subpleural right lower lobe nodule. Attention on followup exams is warranted. 4. Aortic atherosclerosis (ICD10-170.0). Three-vessel coronary artery calcification. 5. Cholelithiasis.  Spirometry 02/27/16: FVC 3.00 (78%), FEV1 1.57 (57%), FEF25-75% 0.91 (46%). Moderately severe obstruction.  Preoperative labs noted. Na 127 (as low as 22 in 2010, but primarily 127-131), K, 3.4, Cr 0.73, H/H 11.6/33.9. PLT 179. Glucose 123. I'll order an ISTAT4 on the day of surgery to ensure stability of Na.  If repeat labs stable and no acute cardiopulmonary issues then I would anticipate that he could proceed as planned.  Bradley Meadows [facility]Anesthesiology Phone [phone] 03/25/2016 10:27 AM

## 2016-03-26 ENCOUNTER — Encounter (HOSPITAL_COMMUNITY): Payer: Self-pay | Admitting: *Deleted

## 2016-03-26 ENCOUNTER — Ambulatory Visit (HOSPITAL_COMMUNITY)
Admission: RE | Admit: 2016-03-26 | Discharge: 2016-03-26 | Disposition: A | Discharge status: Home / Self Care | Payer: Medicare Other | Source: Ambulatory Visit | Attending: Emergency Medicine | Admitting: Emergency Medicine

## 2016-03-26 ENCOUNTER — Ambulatory Visit (HOSPITAL_COMMUNITY): Payer: Medicare Other | Admitting: Certified Registered Nurse Anesthetist

## 2016-03-26 ENCOUNTER — Other Ambulatory Visit: Payer: Self-pay | Admitting: Emergency Medicine

## 2016-03-26 ENCOUNTER — Encounter (HOSPITAL_COMMUNITY)
Admission: RE | Disposition: A | Discharge status: Home / Self Care | Payer: Self-pay | Source: Ambulatory Visit | Attending: Emergency Medicine

## 2016-03-26 DIAGNOSIS — K229 Disease of esophagus, unspecified: Secondary | ICD-10-CM

## 2016-03-26 DIAGNOSIS — I2510 Atherosclerotic heart disease of native coronary artery without angina pectoris: Secondary | ICD-10-CM | POA: Insufficient documentation

## 2016-03-26 DIAGNOSIS — R846 Abnormal cytological findings in specimens from respiratory organs and thorax: Secondary | ICD-10-CM | POA: Diagnosis not present

## 2016-03-26 DIAGNOSIS — K228 Other specified diseases of esophagus: Secondary | ICD-10-CM

## 2016-03-26 DIAGNOSIS — E785 Hyperlipidemia, unspecified: Secondary | ICD-10-CM | POA: Diagnosis not present

## 2016-03-26 DIAGNOSIS — R938 Abnormal findings on diagnostic imaging of other specified body structures: Secondary | ICD-10-CM | POA: Diagnosis not present

## 2016-03-26 DIAGNOSIS — Z87891 Personal history of nicotine dependence: Secondary | ICD-10-CM | POA: Insufficient documentation

## 2016-03-26 DIAGNOSIS — D649 Anemia, unspecified: Secondary | ICD-10-CM | POA: Diagnosis not present

## 2016-03-26 DIAGNOSIS — I110 Hypertensive heart disease with heart failure: Secondary | ICD-10-CM | POA: Insufficient documentation

## 2016-03-26 DIAGNOSIS — Z79899 Other long term (current) drug therapy: Secondary | ICD-10-CM | POA: Diagnosis not present

## 2016-03-26 DIAGNOSIS — Z7982 Long term (current) use of aspirin: Secondary | ICD-10-CM | POA: Insufficient documentation

## 2016-03-26 DIAGNOSIS — C3482 Malignant neoplasm of overlapping sites of left bronchus and lung: Secondary | ICD-10-CM | POA: Insufficient documentation

## 2016-03-26 DIAGNOSIS — J449 Chronic obstructive pulmonary disease, unspecified: Secondary | ICD-10-CM | POA: Insufficient documentation

## 2016-03-26 DIAGNOSIS — I5022 Chronic systolic (congestive) heart failure: Secondary | ICD-10-CM | POA: Diagnosis not present

## 2016-03-26 DIAGNOSIS — R918 Other nonspecific abnormal finding of lung field: Secondary | ICD-10-CM | POA: Diagnosis not present

## 2016-03-26 DIAGNOSIS — K219 Gastro-esophageal reflux disease without esophagitis: Secondary | ICD-10-CM | POA: Diagnosis not present

## 2016-03-26 DIAGNOSIS — I10 Essential (primary) hypertension: Secondary | ICD-10-CM | POA: Diagnosis not present

## 2016-03-26 DIAGNOSIS — C3402 Malignant neoplasm of left main bronchus: Secondary | ICD-10-CM | POA: Diagnosis not present

## 2016-03-26 DIAGNOSIS — R9389 Abnormal findings on diagnostic imaging of other specified body structures: Secondary | ICD-10-CM

## 2016-03-26 DIAGNOSIS — C3400 Malignant neoplasm of unspecified main bronchus: Secondary | ICD-10-CM | POA: Diagnosis not present

## 2016-03-26 DIAGNOSIS — K2289 Other specified disease of esophagus: Secondary | ICD-10-CM

## 2016-03-26 HISTORY — PX: VIDEO BRONCHOSCOPY WITH ENDOBRONCHIAL ULTRASOUND: SHX6177

## 2016-03-26 LAB — POCT I-STAT 4, (NA,K, GLUC, HGB,HCT)
Glucose, Bld: 105 mg/dL — ABNORMAL HIGH (ref 65–99)
HCT: 34 % — ABNORMAL LOW (ref 39.0–52.0)
HEMOGLOBIN: 11.6 g/dL — AB (ref 13.0–17.0)
Potassium: 3.4 mmol/L — ABNORMAL LOW (ref 3.5–5.1)
Sodium: 131 mmol/L — ABNORMAL LOW (ref 135–145)

## 2016-03-26 SURGERY — BRONCHOSCOPY, WITH EBUS
Anesthesia: General

## 2016-03-26 MED ORDER — HYDROCODONE-ACETAMINOPHEN 5-325 MG PO TABS: 1.0000 | ORAL_TABLET | Freq: Two times a day (BID) | ORAL | Status: DC | PRN

## 2016-03-26 MED ORDER — ROCURONIUM BROMIDE 50 MG/5ML IV SOSY
PREFILLED_SYRINGE | INTRAVENOUS | Status: AC
  Filled 2016-03-26: qty 5

## 2016-03-26 MED ORDER — ALBUTEROL SULFATE (2.5 MG/3ML) 0.083% IN NEBU
2.5000 mg | INHALATION_SOLUTION | Freq: Four times a day (QID) | RESPIRATORY_TRACT | Status: DC | PRN
  Administered 2016-03-26: 2.5 mg via RESPIRATORY_TRACT

## 2016-03-26 MED ORDER — PHENYLEPHRINE 40 MCG/ML (10ML) SYRINGE FOR IV PUSH (FOR BLOOD PRESSURE SUPPORT)
PREFILLED_SYRINGE | INTRAVENOUS | Status: DC | PRN
  Administered 2016-03-26: 40 ug via INTRAVENOUS

## 2016-03-26 MED ORDER — ONDANSETRON HCL 4 MG/2ML IJ SOLN
INTRAMUSCULAR | Status: DC | PRN
  Administered 2016-03-26: 4 mg via INTRAVENOUS

## 2016-03-26 MED ORDER — EPINEPHRINE PF 1 MG/ML IJ SOLN
INTRAMUSCULAR | Status: AC
  Filled 2016-03-26: qty 1

## 2016-03-26 MED ORDER — METOPROLOL TARTRATE 25 MG PO TABS: 25.0000 mg | ORAL_TABLET | Freq: Two times a day (BID) | ORAL | Status: DC

## 2016-03-26 MED ORDER — EPHEDRINE 5 MG/ML INJ
INTRAVENOUS | Status: AC
  Filled 2016-03-26: qty 10

## 2016-03-26 MED ORDER — IPRATROPIUM-ALBUTEROL 0.5-2.5 (3) MG/3ML IN SOLN: RESPIRATORY_TRACT | 0 refills | Status: DC

## 2016-03-26 MED ORDER — LIDOCAINE 2% (20 MG/ML) 5 ML SYRINGE
INTRAMUSCULAR | Status: AC
  Filled 2016-03-26: qty 5

## 2016-03-26 MED ORDER — ALBUTEROL SULFATE (2.5 MG/3ML) 0.083% IN NEBU
INHALATION_SOLUTION | RESPIRATORY_TRACT | Status: AC
  Filled 2016-03-26: qty 3

## 2016-03-26 MED ORDER — SUGAMMADEX SODIUM 200 MG/2ML IV SOLN
INTRAVENOUS | Status: DC | PRN
  Administered 2016-03-26: 100 mg via INTRAVENOUS

## 2016-03-26 MED ORDER — LIDOCAINE 2% (20 MG/ML) 5 ML SYRINGE
INTRAMUSCULAR | Status: DC | PRN
  Administered 2016-03-26: 100 mg via INTRAVENOUS

## 2016-03-26 MED ORDER — PHENYLEPHRINE 40 MCG/ML (10ML) SYRINGE FOR IV PUSH (FOR BLOOD PRESSURE SUPPORT)
PREFILLED_SYRINGE | INTRAVENOUS | Status: AC
  Filled 2016-03-26: qty 10

## 2016-03-26 MED ORDER — LACTATED RINGERS IV SOLN
INTRAVENOUS | Status: DC | PRN
  Administered 2016-03-26: 08:00:00 via INTRAVENOUS

## 2016-03-26 MED ORDER — SUCCINYLCHOLINE CHLORIDE 200 MG/10ML IV SOSY
PREFILLED_SYRINGE | INTRAVENOUS | Status: AC
  Filled 2016-03-26: qty 10

## 2016-03-26 MED ORDER — PHENYLEPHRINE HCL 10 MG/ML IJ SOLN
INTRAVENOUS | Status: DC | PRN
  Administered 2016-03-26: 30 ug/min via INTRAVENOUS

## 2016-03-26 MED ORDER — ONDANSETRON HCL 4 MG/2ML IJ SOLN
INTRAMUSCULAR | Status: AC
  Filled 2016-03-26: qty 2

## 2016-03-26 MED ORDER — SUGAMMADEX SODIUM 500 MG/5ML IV SOLN
INTRAVENOUS | Status: DC | PRN
  Administered 2016-03-26: 170 mg via INTRAVENOUS

## 2016-03-26 MED ORDER — HYDROMORPHONE HCL 1 MG/ML IJ SOLN: 0.2500 mg | INTRAMUSCULAR | Status: DC | PRN

## 2016-03-26 MED ORDER — FENTANYL CITRATE (PF) 100 MCG/2ML IJ SOLN
INTRAMUSCULAR | Status: AC
  Filled 2016-03-26: qty 2

## 2016-03-26 MED ORDER — PROPOFOL 10 MG/ML IV BOLUS
INTRAVENOUS | Status: AC
  Filled 2016-03-26: qty 20

## 2016-03-26 MED ORDER — FENTANYL CITRATE (PF) 100 MCG/2ML IJ SOLN
INTRAMUSCULAR | Status: DC | PRN
Start: 2016-03-26 — End: 2016-03-26
  Administered 2016-03-26 (×2): 25 ug via INTRAVENOUS

## 2016-03-26 MED ORDER — ROCURONIUM BROMIDE 10 MG/ML (PF) SYRINGE
PREFILLED_SYRINGE | INTRAVENOUS | Status: DC | PRN
  Administered 2016-03-26: 30 mg via INTRAVENOUS

## 2016-03-26 MED ORDER — EPINEPHRINE PF 1 MG/10ML IJ SOSY
PREFILLED_SYRINGE | INTRAMUSCULAR | Status: AC
  Filled 2016-03-26: qty 10

## 2016-03-26 MED ORDER — SUCCINYLCHOLINE CHLORIDE 20 MG/ML IJ SOLN
INTRAMUSCULAR | Status: DC | PRN
  Administered 2016-03-26: 100 mg via INTRAVENOUS

## 2016-03-26 MED ORDER — SUGAMMADEX SODIUM 500 MG/5ML IV SOLN
INTRAVENOUS | Status: AC
  Filled 2016-03-26: qty 5

## 2016-03-26 MED ORDER — PROPOFOL 10 MG/ML IV BOLUS
INTRAVENOUS | Status: DC | PRN
  Administered 2016-03-26: 150 mg via INTRAVENOUS

## 2016-03-26 MED ORDER — MIDAZOLAM HCL 2 MG/2ML IJ SOLN
INTRAMUSCULAR | Status: AC
  Filled 2016-03-26: qty 2

## 2016-03-26 SURGICAL SUPPLY — 33 items
BAG SNAP BAND KOVER 36X36 (MISCELLANEOUS) ×2 IMPLANT
BRUSH CYTOL CELLEBRITY 1.5X140 (MISCELLANEOUS) ×2 IMPLANT
CANISTER SUCTION 2500CC (MISCELLANEOUS) ×3 IMPLANT
CONT SPEC 4OZ CLIKSEAL STRL BL (MISCELLANEOUS) ×3 IMPLANT
COVER DOME SNAP 22 D (MISCELLANEOUS) ×3 IMPLANT
COVER TABLE BACK 60X90 (DRAPES) ×3 IMPLANT
FORCEPS BIOP RJ4 1.8 (CUTTING FORCEPS) IMPLANT
FORCEPS RADIAL JAW LRG 4 PULM (INSTRUMENTS) IMPLANT
GAUZE SPONGE 4X4 12PLY STRL (GAUZE/BANDAGES/DRESSINGS) ×3 IMPLANT
GLOVE BIO SURGEON STRL SZ7.5 (GLOVE) ×3 IMPLANT
GOWN STRL REUS W/ TWL LRG LVL3 (GOWN DISPOSABLE) ×1 IMPLANT
GOWN STRL REUS W/TWL LRG LVL3 (GOWN DISPOSABLE) ×3
KIT CLEAN ENDO COMPLIANCE (KITS) ×6 IMPLANT
KIT ROOM TURNOVER OR (KITS) ×3 IMPLANT
MARKER SKIN DUAL TIP RULER LAB (MISCELLANEOUS) ×3 IMPLANT
NDL BIOPSY TRANSBRONCH 21G (NEEDLE) IMPLANT
NDL EBUS SONO TIP PENTAX (NEEDLE) ×1 IMPLANT
NEEDLE BIOPSY TRANSBRONCH 21G (NEEDLE) IMPLANT
NEEDLE EBUS SONO TIP PENTAX (NEEDLE) ×3 IMPLANT
NS IRRIG 1000ML POUR BTL (IV SOLUTION) ×3 IMPLANT
OIL SILICONE PENTAX (PARTS (SERVICE/REPAIRS)) ×3 IMPLANT
PAD ARMBOARD 7.5X6 YLW CONV (MISCELLANEOUS) ×6 IMPLANT
RADIAL JAW LRG 4 PULMONARY (INSTRUMENTS) ×2
SYR 20CC LL (SYRINGE) ×3 IMPLANT
SYR 20ML ECCENTRIC (SYRINGE) ×6 IMPLANT
SYR 50ML LL SCALE MARK (SYRINGE) IMPLANT
SYR 50ML SLIP (SYRINGE) IMPLANT
SYR 5ML LUER SLIP (SYRINGE) ×3 IMPLANT
TOWEL OR 17X24 6PK STRL BLUE (TOWEL DISPOSABLE) ×3 IMPLANT
TRAP SPECIMEN MUCOUS 40CC (MISCELLANEOUS) IMPLANT
TUBE CONNECTING 20'X1/4 (TUBING) ×2
TUBE CONNECTING 20X1/4 (TUBING) ×4 IMPLANT
WATER STERILE IRR 1000ML POUR (IV SOLUTION) ×3 IMPLANT

## 2016-03-26 NOTE — Anesthesia Postprocedure Evaluation (Signed)
Anesthesia Post Note  Patient: [place] WENZLICK  Procedure(s) Performed: Procedure(s) (LRB): VIDEO BRONCHOSCOPY WITH ENDOBRONCHIAL ULTRASOUND (N/A)  Patient location during evaluation: PACU Anesthesia Type: General Level of consciousness: awake Pain management: pain level controlled Vital Signs Assessment: post-procedure vital signs reviewed and stable Cardiovascular status: stable Anesthetic complications: no       Last Vitals:  Vitals:   03/26/16 1030 03/26/16 1047  BP: 108/65 (!) 114/49  Pulse: 88 87  Resp:  20  Temp:      Last Pain:  Vitals:   03/26/16 0754  TempSrc:   PainSc: 5                  Kennard Fildes

## 2016-03-26 NOTE — Discharge Instructions (Signed)
Flexible Bronchoscopy, Care After These instructions give you information on caring for yourself after your procedure. Your doctor may also give you more specific instructions. Call your doctor if you have any problems or questions after your procedure. Follow these instructions at home:  Do not eat or drink anything for 2 hours after your procedure. If you try to eat or drink before the medicine wears off, food or drink could go into your lungs. You could also burn yourself.  After 2 hours have passed and when you can cough and gag normally, you may eat soft food and drink liquids slowly.  The day after the test, you may eat your normal diet.  You may do your normal activities.  Keep all doctor visits. Get help right away if:  You get more and more short of breath.  You get light-headed.  You feel like you are going to pass out (faint).  You have chest pain.  You have new problems that worry you.  You cough up more than a little blood.  You cough up more blood than before.   This information is not intended to replace advice given to you by your health care provider. Make sure you discuss any questions you have with your health care provider. Document Released: 11/17/2008 Document Revised: 06/28/2015 Document Reviewed: 09/24/2012 Elsevier Interactive Patient Education  2017 [place] CALL OUR OFFICE FOR ANY QUESTIONS OR CONCERNS [phone].

## 2016-03-26 NOTE — Anesthesia Preprocedure Evaluation (Addendum)
Anesthesia Evaluation  Patient identified by MRN, date of birth, ID band Patient awake    Reviewed: Allergy & Precautions, NPO status , Patient's Chart, lab work & pertinent test results  Airway Mallampati: II  TM Distance: >3 FB Neck ROM: Full    Dental  (+) Edentulous Upper, Edentulous Lower   Pulmonary asthma , COPD, former smoker,     + decreased breath sounds      Cardiovascular hypertension, + CAD and +CHF   Rhythm:Regular Rate:Normal     Neuro/Psych Depression    GI/Hepatic Neg liver ROS, GERD  ,  Endo/Other  negative endocrine ROS  Renal/GU negative Renal ROS     Musculoskeletal   Abdominal   Peds  Hematology   Anesthesia Other Findings   Reproductive/Obstetrics                            Anesthesia Physical Anesthesia Plan  ASA: III  Anesthesia Plan: General   Post-op Pain Management:    Induction: Intravenous  Airway Management Planned: Oral ETT  Additional Equipment:   Intra-op Plan:   Post-operative Plan: Possible Post-op intubation/ventilation  Informed Consent: I have reviewed the patients History and Physical, chart, labs and discussed the procedure including the risks, benefits and alternatives for the proposed anesthesia with the patient or authorized representative who has indicated his/her understanding and acceptance.   Dental advisory given  Plan Discussed with: CRNA, Anesthesiologist and Surgeon  Anesthesia Plan Comments:         Anesthesia Quick Evaluation

## 2016-03-26 NOTE — Op Note (Signed)
Video Bronchoscopy with Endobronchial Ultrasound Procedure Note  Date of Operation: 03/26/2016  Pre-op Diagnosis: Subcarinal mass  Post-op Diagnosis: Same, probable malignancy  Surgeon: Baltazar Apo  Assistants: None  Anesthesia: General endotracheal anesthesia  Operation: Flexible video fiberoptic bronchoscopy with endobronchial ultrasound and biopsies.  Estimated Blood Loss: 95-62ZH  Complications: None apparent  Indications and History: Bradley Meadows is a 76 y.o. male with hx remote tobacco, experienced dysphagia and dyspnea and noted to have a posterior carinal/esophageal mass in the mediastinum. EGD did not show an intraesophageal lesion. He presents now for bronchoscopy, endobronchial ultrasound, and biopsies of the carinal lesion. The risks, benefits, complications, treatment options and expected outcomes were discussed with the patient.  The possibilities of pneumothorax, pneumonia, reaction to medication, pulmonary aspiration, perforation of a viscus, bleeding, failure to diagnose a condition and creating a complication requiring transfusion or operation were discussed with the patient who freely signed the consent.    Description of Procedure: The patient was examined in the preoperative area and history and data from the preprocedure consultation were reviewed. It was deemed appropriate to proceed.  The patient was taken to OR10, identified as Bradley Meadows and the procedure verified as Flexible Video Fiberoptic Bronchoscopy.  A Time Out was held and the above information confirmed. After being taken to the operating room general anesthesia was initiated and the patient  was orally intubated.   The video fiberoptic bronchoscope was introduced via the endotracheal tube and a general inspection was performed which showed normal proximal trachea. The distal trachea had areas of erythema and raised mucosa posteriorly. The raised area extended down towards the main carina which was  significantly thickened, flayed with friable mucosal endobronchial lesion involving the entire carina. The endobronchial abnormality extended down both mainstem bronchi with white raised lesions along the walls of both proximal mainstems. On the left this was most prominent at the 2:00 and 7:00 positions with approximately 25% obstruction. The scope would pass the lesions down into the left mainstem. More distally the mucosa normalized. Lesion was friable, bled when touched lightly with the bronchoscope. There was white raised friable endobronchial abnormality circumferentially in the right mainstem bronchus all the way down to the right upper lobe orifice which was significantly narrowed. The right mainstem was approximate 10% obstructed and the scope passed easily into the bronchus intermedius. The right middle lobe and right lower lobe airways appeared to be normal without any involvement.  The standard scope was then withdrawn and the endobronchial ultrasound was used to identify and characterize the peritracheal, hilar and bronchial lymph nodes. Inspection showed a large mass at the distal trachea posteriorly just above the level of the main carina. Using real-time ultrasound guidance Wang needle biopsies were take from the carinal mass and were sent for cytology.   Finally the standard bronchoscope was reintroduced and endobronchial brushings were obtained from the left mainstem bronchus, main carina. Forceps endobronchial biopsies were then obtained from the same areas. There was moderate amount of oozing from the lesion when it was sampled. This was slowing significantly upon completion of the procedure.  The patient tolerated the procedure well without apparent complications. The bronchoscope was withdrawn. Anesthesia was reversed and the patient was taken to the PACU for recovery.   Samples: 1. Wang needle biopsies from carinal mass, posterior distal trachea 2. Endobronchial brushings from left  mainstem bronchus 3. Endobronchial brushings from main carina 4. Endobronchial forceps biopsies from left mainstem bronchus 5. Endobronchial forceps biopsies from main carina  Plans:  The patient will be discharged from the PACU to home when recovered from anesthesia. We will review the cytology, pathology and results with the patient when they become available. Outpatient followup will be with Dr Annamaria Boots or Dr Lamonte Sakai.    Baltazar Apo, MD, PhD 03/26/2016, 9:35 AM [facility] [phone] or if no answer [phone]

## 2016-03-26 NOTE — H&P (View-Only) (Signed)
02/27/2016-76 year old male former smoker referred courtesy of Dr. Henrene Pastor GI-"growth in throat" Lesion could not apparently be reached from endoesophageal scope yesterday. O2 2L [employer] and as needed       here with daughter Medical problems include anemia, CAD, CHF/EF 45-50 percent, COPD, GERD, HBP Mr. Mccaffrey stopped smoking last year. Gradually worsening hoarseness, 5 or 6 pounds weight loss, some difficulty swallowing solid foods 3 months. Cough productive mostly clear mucus with occasional trace bloody material like "tissue" 1 month. Using Stiolto Respimat, Combivent- may help some. Upper endoscopy showed some narrowing. CT chest 02/13/2016-IMPRESSION:  I reviewed these images with him and his daughter noting esophageal mass effect with some compression at the tracheal bifurcation. 1. 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 Associated mass effect on and moderate narrowing of the lower trachea and bilateral mainstem bronchi. These results will be called to the ordering clinician or representative by the Radiologist Assistant, and communication documented in the PACS or zVision Dashboard. 2. Subcarinal and gastrohepatic ligament adenopathy. 3. 5 mm subpleural right lower lobe nodule. Attention on followup exams is warranted. 4. Aortic atherosclerosis (ICD10-170.0). Three-vessel coronary artery calcification. 5. Cholelithiasis. Nuclear cardiac study 02/19/2016-low risk stress test normal LV function Office Spirometry-02/27/2016-moderately severe obstructive airways disease. FVC 3.00/78%, FEV1 1.57/57%, ratio or 0.52, FEF 25-75% 0.91/46%.  Prior to Admission medications   Medication Sig Start Date End Date Taking? Authorizing Provider  albuterol-ipratropium (COMBIVENT) 18-103 MCG/ACT inhaler Inhale 2 puffs into the lungs every 6 (six) hours as needed for wheezing or shortness of breath.   Yes Historical Provider, MD  aspirin 81 MG tablet Take 1 tablet (81 mg  total) by mouth daily. 08/29/10  Yes Merlene Laughter [place], MD  atorvastatin (LIPITOR) 40 MG tablet TAKE 1 TABLET(40 MG) BY MOUTH DAILY 10/09/15  Yes Susy Frizzle, MD  ferrous sulfate 325 (65 FE) MG tablet TAKE 1 TABLET BY MOUTH EVERY DAY WITH BREAKFAST 06/14/15  Yes Susy Frizzle, MD  HYDROcodone-acetaminophen (NORCO/VICODIN) 5-325 MG tablet TK 1 T PO  BID. MUST LAST 30 DAYS Patient taking differently: Take 1 tablet by mouth every 12 (twelve) hours as needed for moderate pain. TK 1 T PO  BID. MUST LAST 30 DAYS 01/31/16  Yes Susy Frizzle, MD  ipratropium-albuterol (DUONEB) 0.5-2.5 (3) MG/3ML SOLN USE 1 VIAL VIA NEBULIZER EVERY 6 HOURS AS NEEDED Patient taking differently: USE 1 VIAL VIA NEBULIZER EVERY 6 HOURS AS NEEDED FOR SHORTNESS OF BREATH OR WHEEZING 12/26/15  Yes Susy Frizzle, MD  lisinopril-hydrochlorothiazide (PRINZIDE,ZESTORETIC) 20-12.5 MG tablet TAKE 1 TABLET BY MOUTH DAILY 01/07/16  Yes Susy Frizzle, MD  metoprolol tartrate (LOPRESSOR) 25 MG tablet Take 1.5 tablets (37.5 mg total) by mouth 2 (two) times daily. Patient taking differently: Take 25 mg by mouth 2 (two) times daily.  10/01/15  Yes Mihai Croitoru, MD  Multiple Vitamin (MULTIVITAMIN) tablet Take 1 tablet by mouth daily. Centrum Silver   Yes Historical Provider, MD  omeprazole (PRILOSEC) 20 MG capsule TAKE ONE CAPSULE BY MOUTH EVERY DAY 12/12/15  Yes Susy Frizzle, MD  Tiotropium Bromide-Olodaterol (STIOLTO RESPIMAT) 2.5-2.5 MCG/ACT AERS Inhale 2 puffs into the lungs daily. 04/19/15  Yes Susy Frizzle, MD  ALPRAZolam Duanne Moron) 0.5 MG tablet 1 tab 30-or 45  minutes before exam as needed for anxiety 02/27/16   Deneise Lever, MD   Past Medical History:  Diagnosis Date  . CHF (congestive heart failure) (HCC)    ef=45-50%  . COPD (chronic obstructive pulmonary disease) ([place])  home O2  . GERD (gastroesophageal reflux disease)    ulcers  . Hyperlipidemia   . Hypertension    Past Surgical History:  Procedure  Laterality Date  . ESOPHAGOGASTRODUODENOSCOPY (EGD) WITH PROPOFOL N/A 02/26/2016   Procedure: ESOPHAGOGASTRODUODENOSCOPY (EGD) WITH PROPOFOL;  Surgeon: Irene Shipper, MD;  Location: [facility];  Service: Endoscopy;  Laterality: N/A;  . EYE SURGERY     cataracts  . HERNIA REPAIR    . JOINT REPLACEMENT     knee both   Family History  Problem Relation Age of Onset  . Arthritis Mother   . COPD Mother   . Alcohol abuse Father   . Breast cancer Sister   . Heart disease Paternal Grandfather   . Hyperlipidemia Sister    Social History   Social History  . Marital status: Divorced    Spouse name: N/A  . Number of children: 3  . Years of education: 9   Occupational History  . RetiredAnimal nutritionist Retired   Social History Main Topics  . Smoking status: Former Smoker    Packs/day: 1.00    Types: Cigarettes    Quit date: 09/29/2015  . Smokeless tobacco: Never Used  . Alcohol use No  . Drug use: No  . Sexual activity: Not Currently   Other Topics Concern  . Not on file   Social History Narrative   Grew up in [place] area.    Left school after 9th grade. No father and had to work.    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       Health Care POA:    Emergency Contact: sister, Hector Brunswick (h) [phone]   End of Life Plan:    Who lives with you: self   Any pets: Lorrin Goodell   Diet: Pt limits sugars and starches and focuses on vegetables an protein. Pt has lost 50 lbs over several years.   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   Seatbelts: Pt reports wearing seatbelt when in vehicle.   Sun Exposure/Protection: Pt does not use sun protectin.   Hobbies: Racing, TV, gardening, Architect         ROS-see HPI   Negative unless "+" Constitutional:    +weight loss, night sweats, fevers, chills, fatigue, lassitude. HEENT:    headaches, +difficulty swallowing, tooth/dental problems, sore throat,        sneezing, itching, ear ache, nasal congestion, post nasal drip, snoring CV:    chest pain, orthopnea, PND, swelling in lower extremities, anasarca,                                                    dizziness, palpitations Resp:  + shortness of breath with exertion or at rest.               + productive cough,   non-productive cough, +coughing up of blood.              change in color of mucus.  wheezing.   Skin:    rash or lesions. GI:  No-   heartburn, indigestion, abdominal pain, nausea, vomiting, diarrhea,                 change in bowel habits, loss of appetite GU: dysuria, change in color of urine, no urgency  or frequency.   flank pain. MS:   joint pain, stiffness, decreased range of motion, back pain. Neuro-     nothing unusual Psych:  change in mood or affect.  depression or anxiety.   memory loss.  OBJ- Physical Exam General- Alert, Oriented, Affect-appropriate, Distress- none acute, pleasant Skin- rash-none, lesions- none, excoriation- none Lymphadenopathy- none Head- atraumatic            Eyes- Gross vision intact, PERRLA, conjunctivae and secretions clear            Ears- Hearing, canals-normal            Nose- Clear, no-Septal dev, mucus, polyps, erosion, perforation             Throat- Mallampati III-IV , mucosa clear , drainage- none, tonsils- atrophic, + hoarse Neck- flexible , trachea midline, + stridor , thyroid nl, carotid no bruit Chest - symmetrical excursion , unlabored           Heart/CV- RRR , no murmur , no gallop  , no rub, nl s1 s2                           - JVD- none , edema- none, stasis changes- none, varices- none           Lung- + transmitted upper airway noise, wheeze- none, cough- none , dullness-none, rub- none           Chest wall-  Abd-  Br/ Gen/ Rectal- Not done, not indicated Extrem- cyanosis- none, clubbing, none, atrophy- none, strength- nl Neuro- grossly intact to observation. Palate lifts normally

## 2016-03-26 NOTE — Anesthesia Procedure Notes (Signed)
Procedure Name: Intubation Date/Time: 03/26/2016 8:38 AM Performed by: Julieta Bellini Pre-anesthesia Checklist: Patient identified, Emergency Drugs available, Suction available and Patient being monitored Patient Re-evaluated:Patient Re-evaluated prior to inductionOxygen Delivery Method: Circle system utilized Preoxygenation: Pre-oxygenation with 100% oxygen Intubation Type: IV induction Ventilation: Mask ventilation without difficulty Laryngoscope Size: Mac and 3 Grade View: Grade I Tube type: Oral Tube size: 8.5 mm Number of attempts: 1 Airway Equipment and Method: Stylet Placement Confirmation: ETT inserted through vocal cords under direct vision,  positive ETCO2 and breath sounds checked- equal and bilateral Secured at: 24 cm Tube secured with: Tape Dental Injury: Teeth and Oropharynx as per pre-operative assessment

## 2016-03-26 NOTE — Interval H&P Note (Signed)
PCCM Interval Note  76 yo man, hx tobacco use, CAD, chronic sCHF, COPD, HTN. He has been experiencing hoarseness, dysphagia with food getting stuck for over 3 months. CT chest 1/10 with subcarinal lesion suspected to be an esophageal mass.  EGD on 1/23 did not show any intra-esophageal lesion or evidence of an obstruction. PET scan 2/6 confirmed that the lesion is hypermetabolic. Attention turned to other strategies for biopsy.  It's location makes it amenable to EBUS and needle biopsies. He presents for this procedure today.   He continues to experience hoarse voice, dysphagia. He has developed some dyspnea as well. No cough. No new meds or significant clinical changes reported since he was seen in our office 1/24.   Procedure explained to him and to his daughter this am in pre-procedure area. All questions answered.     02/13/16 -- CT  IMPRESSION: 1. 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 Associated mass effect on and moderate narrowing of the lower trachea and bilateral mainstem bronchi. These results will be called to the ordering clinician or representative by the Radiologist Assistant, and communication documented in the PACS or zVision Dashboard. 2. Subcarinal and gastrohepatic ligament adenopathy. 3. 5 mm subpleural right lower lobe nodule. Attention on followup exams is warranted. 4. Aortic atherosclerosis (ICD10-170.0). Three-vessel coronary artery calcification. 5. Cholelithiasis  PET scan 03/11/16 --  IMPRESSION: 3.9 cm hypermetabolic middle mediastinal mass along the posterior aspect of the distal trachea and subcarinal region. Differential considerations include primary bronchogenic carcinoma and lymphoma (esophageal carcinoma was excluded by recent upper endoscopy). Consider bronchoscopy for tissue diagnosis.  Small asymmetric focus of hypermetabolic activity in right hilum, and 1.4 cm hypermetabolic upper abdominal lymph noted  gastrohepatic ligament.  Hypermetabolic 5 mm subpleural right lower lobe pulmonary nodule. Degree of hypermetabolic activity relative to small size of this nodule favors inflammatory etiology over neoplasm. Recommend continued attention recommended on follow-up CT.  Hypermetabolic activity in right vocal cord, without definite mass visualized by CT. Differential considerations include laryngeal carcinoma and left vocal cord paralysis. Consider laryngoscopy for direct visualization .  Incidental findings including aortic and coronary artery atherosclerosis, mild emphysema, large left inguinal hernia containing sigmoid colon, and cholelithiasis .  Vitals:   03/26/16 0705 03/26/16 0707  BP:  (!) 101/46  Pulse: 69   Resp: 20   Temp: 98.1 F (36.7 C)   TempSrc: Oral   SpO2: 98%   Weight: 82.1 kg (181 lb)    Gen: Pleasant, well-nourished elderly man, in no distress,  normal affect  ENT: No lesions,  mouth clear,  oropharynx clear, no postnasal drip, M1 airway  Neck: No JVD, no stridor  Lungs: No use of accessory muscles, clear without rales or rhonchi  Cardiovascular: RRR, heart sounds normal, no murmur or gallops, no peripheral edema  Musculoskeletal: No deformities, no cyanosis or clubbing  Neuro: alert, non focal  Skin: Warm, no lesions or rashes   Plan:  Will proceed with EBUS, needle biopsies of subcarinal mass under general anesthesia. Consent signed. No barriers identified.   Baltazar Apo, MD, PhD 03/26/2016, 8:22 AM [facility] [phone] or if no answer [phone]

## 2016-03-26 NOTE — Transfer of Care (Signed)
Immediate Anesthesia Transfer of Care Note  Patient: Bradley Meadows  Procedure(s) Performed: Procedure(s): VIDEO BRONCHOSCOPY WITH ENDOBRONCHIAL ULTRASOUND (N/A)  Patient Location: PACU  Anesthesia Type:General  Level of Consciousness: awake, alert  and oriented  Airway & Oxygen Therapy: Patient Spontanous Breathing and Patient connected to face mask oxygen  Post-op Assessment: Report given to RN and Post -op Vital signs reviewed and stable  Post vital signs: Reviewed and stable  Last Vitals:  Vitals:   03/26/16 0705 03/26/16 0707  BP:  (!) 101/46  Pulse: 69   Resp: 20   Temp: 36.7 C     Last Pain:  Vitals:   03/26/16 0754  TempSrc:   PainSc: 5       Patients Stated Pain Goal: 1 (69/50/72 2575)  Complications: No apparent anesthesia complications

## 2016-03-27 ENCOUNTER — Encounter (HOSPITAL_COMMUNITY): Payer: Self-pay | Admitting: Emergency Medicine

## 2016-04-02 ENCOUNTER — Other Ambulatory Visit (HOSPITAL_COMMUNITY)
Admission: RE | Admit: 2016-04-02 | Discharge: 2016-04-02 | Disposition: A | Discharge status: Home / Self Care | Payer: Medicare Other | Source: Ambulatory Visit | Attending: Emergency Medicine | Admitting: Emergency Medicine

## 2016-04-02 ENCOUNTER — Telehealth: Payer: Self-pay | Admitting: Emergency Medicine

## 2016-04-02 ENCOUNTER — Other Ambulatory Visit: Payer: Self-pay | Admitting: Internal Medicine

## 2016-04-02 DIAGNOSIS — C3490 Malignant neoplasm of unspecified part of unspecified bronchus or lung: Secondary | ICD-10-CM

## 2016-04-02 MED ORDER — ALPRAZOLAM 0.5 MG PO TABS: ORAL_TABLET | ORAL | 0 refills | Status: DC

## 2016-04-02 NOTE — Telephone Encounter (Signed)
RB- please advise on rx for medication to take before the PET b/c pt is claustrophobic, thanks

## 2016-04-02 NOTE — Telephone Encounter (Signed)
Spoke with pt's daughter, [name]. She is requesting results from pt's biopsy.  RB - please advise. Thanks.

## 2016-04-02 NOTE — Telephone Encounter (Signed)
He could take xanax 0.'5mg'$  x 1

## 2016-04-02 NOTE — Telephone Encounter (Signed)
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 They have requested that we refer pt to see Dr Katheran Awe. Will work on arranging this.   Called pt's number and no answer, left him a message.

## 2016-04-02 NOTE — Telephone Encounter (Signed)
Called and spoke to pt's daughter, [name]. Informed her of the recs per RB. Rx sent to preferred pharmacy. [name] verbalized understanding and denied any further questions or concerns at this time.

## 2016-04-05 ENCOUNTER — Ambulatory Visit (HOSPITAL_COMMUNITY)
Admission: RE | Admit: 2016-04-05 | Discharge: 2016-04-05 | Disposition: A | Discharge status: Home / Self Care | Payer: Medicare Other | Source: Ambulatory Visit | Attending: Emergency Medicine | Admitting: Emergency Medicine

## 2016-04-05 DIAGNOSIS — C3490 Malignant neoplasm of unspecified part of unspecified bronchus or lung: Secondary | ICD-10-CM

## 2016-04-05 NOTE — Progress Notes (Signed)
Attempted patient for MRI Brain wo/w after patient had taken oral AntiAnxiety medication. Pt unable to tolerate head coil. Multiple attempts were mad to make patient comfortable but he could not tolerate. Spoke with his sister and they are to call MD Mon am to ask MD about having MRI done at [facility] under sedation.    bhj

## 2016-04-06 ENCOUNTER — Other Ambulatory Visit: Payer: Self-pay | Admitting: Family Medicine

## 2016-04-07 ENCOUNTER — Ambulatory Visit (HOSPITAL_BASED_OUTPATIENT_CLINIC_OR_DEPARTMENT_OTHER): Payer: Medicare Other | Admitting: Hematology & Oncology

## 2016-04-07 ENCOUNTER — Other Ambulatory Visit (HOSPITAL_BASED_OUTPATIENT_CLINIC_OR_DEPARTMENT_OTHER): Payer: Medicare Other

## 2016-04-07 ENCOUNTER — Ambulatory Visit: Payer: Medicare Other

## 2016-04-07 ENCOUNTER — Encounter: Payer: Self-pay | Admitting: Hematology & Oncology

## 2016-04-07 VITALS — BP 97/53 | HR 73 | Temp 98.0°F | Resp 18 | Wt 180.1 lb

## 2016-04-07 DIAGNOSIS — C3491 Malignant neoplasm of unspecified part of right bronchus or lung: Secondary | ICD-10-CM | POA: Diagnosis not present

## 2016-04-07 DIAGNOSIS — C8280 Other types of follicular lymphoma, unspecified site: Secondary | ICD-10-CM | POA: Diagnosis not present

## 2016-04-07 DIAGNOSIS — R634 Abnormal weight loss: Secondary | ICD-10-CM | POA: Diagnosis not present

## 2016-04-07 DIAGNOSIS — R1310 Dysphagia, unspecified: Secondary | ICD-10-CM

## 2016-04-07 DIAGNOSIS — C3400 Malignant neoplasm of unspecified main bronchus: Secondary | ICD-10-CM

## 2016-04-07 DIAGNOSIS — Z7189 Other specified counseling: Secondary | ICD-10-CM

## 2016-04-07 HISTORY — DX: Other specified counseling: Z71.89

## 2016-04-07 HISTORY — DX: Malignant neoplasm of unspecified part of right bronchus or lung: C34.91

## 2016-04-07 LAB — CBC WITH DIFFERENTIAL (CANCER CENTER ONLY)
BASO#: 0 10*3/uL (ref 0.0–0.2)
BASO%: 0.5 % (ref 0.0–2.0)
EOS%: 1.4 % (ref 0.0–7.0)
Eosinophils Absolute: 0.1 10*3/uL (ref 0.0–0.5)
HEMATOCRIT: 35.5 % — AB (ref 38.7–49.9)
HGB: 12.4 g/dL — ABNORMAL LOW (ref 13.0–17.1)
LYMPH#: 1.4 10*3/uL (ref 0.9–3.3)
LYMPH%: 17.8 % (ref 14.0–48.0)
MCH: 32.8 pg (ref 28.0–33.4)
MCHC: 34.9 g/dL (ref 32.0–35.9)
MCV: 94 fL (ref 82–98)
MONO#: 0.7 10*3/uL (ref 0.1–0.9)
MONO%: 9.3 % (ref 0.0–13.0)
NEUT%: 71 % (ref 40.0–80.0)
NEUTROS ABS: 5.5 10*3/uL (ref 1.5–6.5)
Platelets: 209 10*3/uL (ref 145–400)
RBC: 3.78 10*6/uL — AB (ref 4.20–5.70)
RDW: 13.2 % (ref 11.1–15.7)
WBC: 7.7 10*3/uL (ref 4.0–10.0)

## 2016-04-07 LAB — CMP (CANCER CENTER ONLY)
ALT(SGPT): 16 U/L (ref 10–47)
AST: 24 U/L (ref 11–38)
Albumin: 3.8 g/dL (ref 3.3–5.5)
Alkaline Phosphatase: 58 U/L (ref 26–84)
BUN: 9 mg/dL (ref 7–22)
CALCIUM: 9.6 mg/dL (ref 8.0–10.3)
CO2: 31 mEq/L (ref 18–33)
Chloride: 89 mEq/L — ABNORMAL LOW (ref 98–108)
Creat: 0.7 mg/dl (ref 0.6–1.2)
Glucose, Bld: 127 mg/dL — ABNORMAL HIGH (ref 73–118)
Potassium: 4.3 mEq/L (ref 3.3–4.7)
Sodium: 133 mEq/L (ref 128–145)
TOTAL PROTEIN: 6.2 g/dL — AB (ref 6.4–8.1)
Total Bilirubin: 0.8 mg/dl (ref 0.20–1.60)

## 2016-04-07 MED ORDER — SUCRALFATE 1 GM/10ML PO SUSP: 1.0000 g | Freq: Three times a day (TID) | ORAL | 3 refills | Status: DC

## 2016-04-07 NOTE — Progress Notes (Signed)
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 : I really cannot swallow because of cancer.  HPI: Bradley Meadows is a very nice 76 year old white male. He has been in relatively good shape. He has been working pretty much up until the past month or so.  I think over the holidays, his family began to notice that he was losing some weight. He has lost about 50 pounds over the past 4 or 5 months.  He's had more and more dysphagia. He now is only able to swallow thick liquids and maybe some pured food.  He did undergo an upper endoscopy. This was back in January. This showed some extrinsic compression on the soft. Said 30 cm.  He has had a CT scan. This was done in early January. 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 There is subcarinal and gastrohepatic adenopathy.  A PET scan was done on February 6. This showed a 3.9 cm middle mediastinal mass. This was incredibly hypermetabolic. There was some activity in the right hilum. There was adenopathy in the gastrohepatic ligament. This was 1.4 cm. There was a possible 5 mm right lower lobe nodule. There is no bone metastasis. There was hyper metabolic activity in the right focal cord.  He was seen by Dr. Lamonte Sakai. A bronchoscopy was done. Biopsies were taken. The bronchoscopy report showed a normal proximal trachea. Nothing is mentioned about the vocal cords. There is abdomen out is in the distal trachea. At the main carina, there was thickening. There was an endobronchial lesion involving the entire carina. There was abdomen out in the right mainstem bronchus. An endobronchial ultrasound was done. This showed a large mass of the distal trachea this above the level of the main carina.  Biopsies were taken and the pathology report ([id number]) showed squamous cell carcinoma.  Clinically,  he clearly is inoperable. He has at least stage IIIB. He probably has stage for a few consider the gastrohepatic lymph node as metastatic.  Main problem clearly is that he is not able to swallow well.  He has smoked all his life. He probably has at least a 50-pack-year history of tobacco use. He probably has some occupational exposures to inhalants.  There is no family history of malignancy in the family.  He has had no hemoptysis.  He's had no change in bowel or bladder habits. He's had some soft stool because of what he eats.  There is no headache. He's had no blurred vision. He's had no double vision.  There's been no leg swelling.  Overall, his performance status is ECOG 1.    Past Medical History:  Diagnosis Date  . Arthritis   . Asthma   . CHF (congestive heart failure) (HCC)    ef=45-50%  . COPD (chronic obstructive pulmonary disease) ([place])    home O2  . Coronary artery disease    40-50% mLAD, 40% pRCA, 30% mRCA, occluded CX with retrograde filling 06/01/06 cath  . GERD (gastroesophageal reflux disease)    ulcers  . Hyperlipidemia   . Hypertension   . PVC's (premature ventricular contractions)    2017  :  Past Surgical History:  Procedure Laterality Date  . ESOPHAGOGASTRODUODENOSCOPY (EGD) WITH PROPOFOL N/A 02/26/2016   Procedure: ESOPHAGOGASTRODUODENOSCOPY (EGD) WITH PROPOFOL;  Surgeon: Irene Shipper, MD;  Location: [facility];  Service: Endoscopy;  Laterality: N/A;  . EXPLORATORY LAPAROTOMY     for ulcer  "washed me out with a hose pipe"  . EYE SURGERY     cataracts  . HERNIA REPAIR    . JOINT REPLACEMENT     knee both  . VIDEO BRONCHOSCOPY WITH ENDOBRONCHIAL ULTRASOUND N/A 03/26/2016   Procedure: VIDEO BRONCHOSCOPY WITH ENDOBRONCHIAL ULTRASOUND;  Surgeon: Collene Gobble, MD;  Location: [facility];  Service: Thoracic;  Laterality: N/A;  :   Current Outpatient Prescriptions:  .  ALPRAZolam (XANAX) 0.5 MG tablet, Take one tablet 30 minutes prior to scan., Disp:  1 tablet, Rfl: 0 .  aspirin 81 MG tablet, Take 1 tablet (81 mg total) by mouth daily., Disp: 1 tablet, Rfl: 3 .  atorvastatin (LIPITOR) 40 MG tablet, TAKE 1 TABLET(40 MG) BY MOUTH DAILY, Disp: 90 tablet, Rfl: 1 .  HYDROcodone-acetaminophen (NORCO/VICODIN) 5-325 MG tablet, Take 1 tablet by mouth 2 (two) times daily as needed for moderate pain., Disp: , Rfl:  .  Ipratropium-Albuterol (COMBIVENT RESPIMAT) 20-100 MCG/ACT AERS respimat, Inhale 2 puffs into the lungs every 6 (six) hours as needed for wheezing., Disp: , Rfl:  .  ipratropium-albuterol (DUONEB) 0.5-2.5 (3) MG/3ML SOLN, USE 1 VIAL VIA NEBULIZER EVERY 6 HOURS AS NEEDED FOR SHORTNESS OF BREATH OR WHEEZING, Disp: 990 mL, Rfl: 0 .  metoprolol tartrate (LOPRESSOR) 25 MG tablet, Take 1 tablet (25 mg total) by mouth 2 (two) times daily., Disp: , Rfl:  .  Multiple Vitamin (MULTIVITAMIN) tablet, Take 1 tablet by mouth daily. Centrum Silver, Disp: , Rfl:  .  omeprazole (PRILOSEC) 20 MG capsule, TAKE ONE CAPSULE BY MOUTH EVERY DAY, Disp: 90 capsule, Rfl: 3 .  Tiotropium Bromide-Olodaterol (STIOLTO RESPIMAT) 2.5-2.5 MCG/ACT AERS, Inhale 2 puffs into the lungs daily., Disp: 1 Inhaler, Rfl: 11:  :  Allergies  Allergen Reactions  . No Known Allergies   :  Family History  Problem Relation Age of Onset  . Arthritis Mother   . COPD Mother   . Alcohol abuse Father   . Breast cancer Sister   . Heart disease Paternal Grandfather   . Hyperlipidemia Sister   :  Social History   Social History  . Marital status: Divorced    Spouse name: N/A  . Number of children: 3  . Years of education: 9   Occupational History  . RetiredAnimal nutritionist Retired   Social History Main Topics  . Smoking status: Former Smoker    Packs/day: 1.50    Years: 30.00    Types: Cigarettes    Quit date: 09/29/2015  . Smokeless tobacco: Never Used  . Alcohol use No  . Drug use: No  . Sexual activity: Not Currently   Other Topics Concern  . Not on file   Social  History Narrative   Grew up in [place] area.    Left school after 9th grade. No father and had to work.    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       Health Care POA:    Emergency Contact: sister, Hector Brunswick (h) [phone]   End of Life Plan:    Who lives with you: self   Any pets: Lorrin Goodell   Diet: Pt limits sugars and starches and focuses on vegetables an protein. Pt has lost 50 lbs over several years.   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   Seatbelts: Pt reports wearing seatbelt when in vehicle.   Nancy Fetter  Exposure/Protection: Pt does not use sun protectin.   Hobbies: Racing, TV, gardening, construction        :  Pertinent items are noted in HPI.  Exam:  Well-developed and well-nourished elderly male in no obvious distress. Vital signs show temperature of 98. Pulse 73. Blood pressure 97/53. Weight is 180 pounds. Head and neck exam shows no ocular or oral lesions. There are no palpable cervical or supraclavicular lymph nodes. Lungs are relatively clear bilaterally. He has no rales, wheezes or rhonchi. Cardiac exam regular rate and rhythm with no murmurs, rubs or bruits. Abdomen is soft. She has good bowel sounds. There is no fluid wave. There is no palpable liver or spleen tip. He does have healed laparotomy scars. Extremities shows no clubbing, cyanosis or edema. Neurological exam shows no focal neurological deficit. Skin exam shows no rashes, ecchymoses or petechia.  Recent Labs  04/07/16 1310  WBC 7.7  HGB 12.4*  HCT 35.5*  PLT 209    Recent Labs  04/07/16 1310  NA 133  K 4.3  CL 89*  CO2 31  GLUCOSE 127*  BUN 9  CREATININE 0.7  CALCIUM 9.6    Blood smear review:  None  Pathology: None     Assessment and Plan:  Bradley Meadows is a 76 year old white male. He has inoperable-locally advanced-versus metastatic squamous CARCINOMA the lung.  He clearly is being compromised by his  dysphagia. As such, I think we have to focus on treating this large primary and trying to improve his symptoms.  Overall, he is in fairly decent good shape. I think that he could tolerate combined modality therapy.  I talked to he and his daughter for about an hour.  We really have to get a feeding tube put into him. I think that if he were to have aggressive chemoradiation therapy, he would have significant esophagitis and this would severely hamper his ability to swallow. I told him that the feeding tube would be the only way that we will be able to get him through treatment. I explained to him that radiology does his force. I told him that this would not be some that is permanent. However, I suspect it probably would be in for at least 3-4 months. He agrees to have this done.  I spoke with Dr. Sondra Come of radiation oncology. He will see Mr. Jellison this week. He agrees that aggressive local therapy is indicated despite the fact that he likely has metastatic disease.   He will need to have a Port-A-Cath placed. I splinted him and his daughter with a point of the Port-A-Cath was. I explained to them how important the Port-A-Cath is. They agree to have this placed. We will see if radiology can to this for [place] next week.  I would like to try low-dose chemotherapy with radiation. I think weekly cis-platinum would be reasonable. I'm not sure if he could tolerate cisplatin with 5-FU.  There are some logistics that we have to work out. He lives in [place]. His daughter lives in [place]. He lives by himself. We will have to be very cautious. I told him and his daughter that it is conceivable that he may have to live with a family member at some point during therapy. He understands this.  We discussed goals of care. Our goal of care at this point is quality of life and trying to palliate his symptoms. Again, he has a little metastatic disease. As such, I think that his lifespan should be pretty  good.  The main goal is to get him to eat better and swallow better and only way to do that is to treat the primary tumor aggressively. I did not discuss end-of-life issues at this meeting.  I realize that this will be a tough protocol to get through. However, he had a very good performance status prior to getting sick. I really believe that he can get through treatment.  We will hopefully get treatment started next week or so.  At some point, we probably will need an MRI of his brain. I would not think that he would have metastatic disease up in his brain.  I will also call pathology and send off the PD-L1 analysis.  I will plan to get him back to see me in another 3 weeks.

## 2016-04-07 NOTE — Telephone Encounter (Signed)
Dr. Katheran Awe is seeing the pt today, 04/07/16.  RB - is there anything else that needs to be addressed?

## 2016-04-08 ENCOUNTER — Encounter (HOSPITAL_COMMUNITY): Payer: Self-pay

## 2016-04-08 ENCOUNTER — Other Ambulatory Visit: Payer: Self-pay | Admitting: Radiology

## 2016-04-08 ENCOUNTER — Encounter: Payer: Self-pay | Admitting: Radiation Oncology

## 2016-04-08 ENCOUNTER — Ambulatory Visit (HOSPITAL_COMMUNITY): Payer: Medicare Other

## 2016-04-08 ENCOUNTER — Telehealth: Payer: Self-pay

## 2016-04-08 LAB — LACTATE DEHYDROGENASE: LDH: 136 U/L (ref 125–245)

## 2016-04-08 LAB — PREALBUMIN: PREALBUMIN: 18 mg/dL (ref 9–32)

## 2016-04-08 NOTE — Telephone Encounter (Signed)
Requested PD-L1 testing be added to pt's pathology from 03/26/2016 (Accession #[id number]) with Suanne Marker at [facility] per Dr Marin Olp written order. dph

## 2016-04-09 ENCOUNTER — Other Ambulatory Visit: Payer: Self-pay | Admitting: Radiology

## 2016-04-09 NOTE — Telephone Encounter (Signed)
Follow up set. I will close this note

## 2016-04-10 ENCOUNTER — Ambulatory Visit (HOSPITAL_COMMUNITY)
Admission: RE | Admit: 2016-04-10 | Discharge: 2016-04-10 | Disposition: A | Discharge status: Home / Self Care | Payer: Medicare Other | Source: Ambulatory Visit | Attending: Hematology & Oncology | Admitting: Hematology & Oncology

## 2016-04-10 ENCOUNTER — Other Ambulatory Visit: Payer: Medicare Other

## 2016-04-10 ENCOUNTER — Telehealth: Payer: Self-pay | Admitting: *Deleted

## 2016-04-10 ENCOUNTER — Encounter (HOSPITAL_COMMUNITY): Payer: Self-pay

## 2016-04-10 ENCOUNTER — Encounter: Payer: Self-pay | Admitting: *Deleted

## 2016-04-10 ENCOUNTER — Other Ambulatory Visit: Payer: Self-pay | Admitting: Hematology & Oncology

## 2016-04-10 ENCOUNTER — Other Ambulatory Visit: Payer: Self-pay | Admitting: *Deleted

## 2016-04-10 DIAGNOSIS — Z87891 Personal history of nicotine dependence: Secondary | ICD-10-CM | POA: Insufficient documentation

## 2016-04-10 DIAGNOSIS — C3400 Malignant neoplasm of unspecified main bronchus: Secondary | ICD-10-CM

## 2016-04-10 DIAGNOSIS — C3491 Malignant neoplasm of unspecified part of right bronchus or lung: Secondary | ICD-10-CM

## 2016-04-10 DIAGNOSIS — I509 Heart failure, unspecified: Secondary | ICD-10-CM | POA: Insufficient documentation

## 2016-04-10 DIAGNOSIS — K222 Esophageal obstruction: Secondary | ICD-10-CM | POA: Insufficient documentation

## 2016-04-10 DIAGNOSIS — C7989 Secondary malignant neoplasm of other specified sites: Secondary | ICD-10-CM | POA: Diagnosis not present

## 2016-04-10 DIAGNOSIS — Z7189 Other specified counseling: Secondary | ICD-10-CM

## 2016-04-10 DIAGNOSIS — I2510 Atherosclerotic heart disease of native coronary artery without angina pectoris: Secondary | ICD-10-CM | POA: Insufficient documentation

## 2016-04-10 DIAGNOSIS — J449 Chronic obstructive pulmonary disease, unspecified: Secondary | ICD-10-CM | POA: Insufficient documentation

## 2016-04-10 DIAGNOSIS — R1310 Dysphagia, unspecified: Secondary | ICD-10-CM | POA: Insufficient documentation

## 2016-04-10 DIAGNOSIS — I110 Hypertensive heart disease with heart failure: Secondary | ICD-10-CM | POA: Diagnosis not present

## 2016-04-10 DIAGNOSIS — E785 Hyperlipidemia, unspecified: Secondary | ICD-10-CM | POA: Diagnosis not present

## 2016-04-10 DIAGNOSIS — M1990 Unspecified osteoarthritis, unspecified site: Secondary | ICD-10-CM | POA: Diagnosis not present

## 2016-04-10 DIAGNOSIS — K219 Gastro-esophageal reflux disease without esophagitis: Secondary | ICD-10-CM | POA: Insufficient documentation

## 2016-04-10 DIAGNOSIS — Z7982 Long term (current) use of aspirin: Secondary | ICD-10-CM | POA: Insufficient documentation

## 2016-04-10 HISTORY — PX: IR GENERIC HISTORICAL: IMG1180011

## 2016-04-10 HISTORY — PX: PORTACATH PLACEMENT: SHX2246

## 2016-04-10 LAB — CBC WITH DIFFERENTIAL/PLATELET
BASOS PCT: 0 %
Basophils Absolute: 0 10*3/uL (ref 0.0–0.1)
Eosinophils Absolute: 0.1 10*3/uL (ref 0.0–0.7)
Eosinophils Relative: 2 %
HEMATOCRIT: 34.2 % — AB (ref 39.0–52.0)
HEMOGLOBIN: 11.9 g/dL — AB (ref 13.0–17.0)
LYMPHS ABS: 1.6 10*3/uL (ref 0.7–4.0)
LYMPHS PCT: 22 %
MCH: 31.4 pg (ref 26.0–34.0)
MCHC: 34.8 g/dL (ref 30.0–36.0)
MCV: 90.2 fL (ref 78.0–100.0)
MONO ABS: 0.7 10*3/uL (ref 0.1–1.0)
MONOS PCT: 10 %
NEUTROS ABS: 4.8 10*3/uL (ref 1.7–7.7)
NEUTROS PCT: 66 %
Platelets: 203 10*3/uL (ref 150–400)
RBC: 3.79 MIL/uL — ABNORMAL LOW (ref 4.22–5.81)
RDW: 13.5 % (ref 11.5–15.5)
WBC: 7.3 10*3/uL (ref 4.0–10.5)

## 2016-04-10 LAB — PROTIME-INR
INR: 1.05
PROTHROMBIN TIME: 13.8 s (ref 11.4–15.2)

## 2016-04-10 MED ORDER — MIDAZOLAM HCL 2 MG/2ML IJ SOLN
INTRAMUSCULAR | Status: AC
  Filled 2016-04-10: qty 8

## 2016-04-10 MED ORDER — LIDOCAINE-EPINEPHRINE (PF) 2 %-1:200000 IJ SOLN
INTRAMUSCULAR | Status: AC
  Filled 2016-04-10: qty 20

## 2016-04-10 MED ORDER — GLUCAGON HCL (RDNA) 1 MG IJ SOLR
INTRAMUSCULAR | Status: AC | PRN
  Administered 2016-04-10: 1 mg via INTRAVENOUS

## 2016-04-10 MED ORDER — LIDOCAINE-EPINEPHRINE (PF) 2 %-1:200000 IJ SOLN
INTRAMUSCULAR | Status: AC | PRN
  Administered 2016-04-10: 10 mL

## 2016-04-10 MED ORDER — LIDOCAINE HCL 1 % IJ SOLN
INTRAMUSCULAR | Status: AC
  Filled 2016-04-10: qty 20

## 2016-04-10 MED ORDER — MIDAZOLAM HCL 2 MG/2ML IJ SOLN
INTRAMUSCULAR | Status: AC | PRN
  Administered 2016-04-10 (×6): 1 mg via INTRAVENOUS

## 2016-04-10 MED ORDER — CEFAZOLIN SODIUM-DEXTROSE 2-4 GM/100ML-% IV SOLN
2.0000 g | INTRAVENOUS | Status: AC
  Administered 2016-04-10: 2 g via INTRAVENOUS

## 2016-04-10 MED ORDER — FENTANYL CITRATE (PF) 100 MCG/2ML IJ SOLN
INTRAMUSCULAR | Status: AC | PRN
  Administered 2016-04-10: 50 ug via INTRAVENOUS
  Administered 2016-04-10 (×2): 25 ug via INTRAVENOUS

## 2016-04-10 MED ORDER — FENTANYL CITRATE (PF) 100 MCG/2ML IJ SOLN
INTRAMUSCULAR | Status: AC
  Filled 2016-04-10: qty 6

## 2016-04-10 MED ORDER — SODIUM CHLORIDE 0.9 % IV SOLN
INTRAVENOUS | Status: DC
  Administered 2016-04-10: 13:00:00 via INTRAVENOUS

## 2016-04-10 MED ORDER — HEPARIN SOD (PORK) LOCK FLUSH 100 UNIT/ML IV SOLN
INTRAVENOUS | Status: AC
  Filled 2016-04-10: qty 5

## 2016-04-10 MED ORDER — GLUCAGON HCL RDNA (DIAGNOSTIC) 1 MG IJ SOLR
INTRAMUSCULAR | Status: AC
  Filled 2016-04-10: qty 1

## 2016-04-10 MED ORDER — CEFAZOLIN SODIUM-DEXTROSE 2-4 GM/100ML-% IV SOLN
INTRAVENOUS | Status: AC
  Filled 2016-04-10: qty 100

## 2016-04-10 MED ORDER — LIDOCAINE HCL 1 % IJ SOLN
INTRAMUSCULAR | Status: AC | PRN
  Administered 2016-04-10: 10 mL

## 2016-04-10 MED ORDER — HEPARIN SOD (PORK) LOCK FLUSH 100 UNIT/ML IV SOLN
INTRAVENOUS | Status: AC | PRN
  Administered 2016-04-10: 500 [IU]

## 2016-04-10 NOTE — Sedation Documentation (Signed)
Patient is resting comfortably. 

## 2016-04-10 NOTE — Progress Notes (Signed)
Oncology Nurse Navigator Documentation  In response to call from Charlsie Merles, RN, [facility], provided [employer] education to Mr. Clink at [place] [address].  His sister was at bedside.    Using  PEG teaching device   and Teach Back, provided education for PEG use and care, including: hand hygiene, gravity bolus administration of daily water flushes, nutritional supplement, fluids and medications; care of tube insertion site including daily dressing change and cleaning; S&S of infection.  Both correctly verbalized dressing change and cleaning procedures, provided correct return demonstration of gravity administration of water.  I provided written instructions for PEG flushing/dressing change in support of verbal instruction.    Per my discussion with Dory Peru, RD, I instructed him to supplement solid food oral intake with up to 4 bottles of Boost/Ensure daily.  He understands Raford Pitcher will meet with him next week Thursday to provide additional nutritional guidance.  He understands he is to continue eating/drinking as much as possible by mouth.    I provided the following supplies:    1 case Ensure, 1 case Osmolite 1.5  Box split gauze, 6 syringes, roll paper tape, bag vinyl gloves.  He understands Barb and I will be available to provide further PEG support as he begins chemo/RT at [facility]  Gayleen Orem, RN, BSN, [place] Nurse [place] at [place] [phone]

## 2016-04-10 NOTE — Procedures (Signed)
  Pre-operative Diagnosis: Metastatic squamous cell carcinoma       Post-operative Diagnosis: Metastatic squamous cell carcinoma   Indications: Needs portacath and G-tube prior to treatment  Procedure: Portacath placement;  Attempted G-tube placement  Findings: Rt IJ port placed.  Tip at SVC/RA junction.  No barium identified in colon.  Stomach is located high in abdomen with transverse orientation.  Did not feel there was a safe percutaneous window based on the imaging and poor visualization of the colon.  G-tube not attempted today.  Complications: None     EBL: Minimal  Plan: Discharge to home tonight.  Discuss G-tube with Dr. Marin Olp.  Willing to attempt G-tube again.  Will need to see colon better with oral barium or rectal barium in future.

## 2016-04-10 NOTE — Sedation Documentation (Signed)
Vital signs stable. NAD. Denies pain.

## 2016-04-10 NOTE — Telephone Encounter (Signed)
Per staff message from [facility] office and the care plan, I have scheduled appts. Notified the [facility] office, they will contact the patient.

## 2016-04-10 NOTE — Discharge Instructions (Addendum)
Moderate Conscious Sedation, Adult, Care After °These instructions provide you with information about caring for yourself after your procedure. Your health care provider may also give you more specific instructions. Your treatment has been planned according to current medical practices, but problems sometimes occur. Call your health care provider if you have any problems or questions after your procedure. °What can I expect after the procedure? °After your procedure, it is common: °· To feel sleepy for several hours. °· To feel clumsy and have poor balance for several hours. °· To have poor judgment for several hours. °· To vomit if you eat too soon. °Follow these instructions at home: °For at least 24 hours after the procedure:  ° °· Do not: °¨ Participate in activities where you could fall or become injured. °¨ Drive. °¨ Use heavy machinery. °¨ Drink alcohol. °¨ Take sleeping pills or medicines that cause drowsiness. °¨ Make important decisions or sign legal documents. °¨ Take care of children on your own. °· Rest. °Eating and drinking  °· Follow the diet recommended by your health care provider. °· If you vomit: °¨ Drink water, juice, or soup when you can drink without vomiting. °¨ Make sure you have little or no nausea before eating solid foods. °General instructions  °· Have a responsible adult stay with you until you are awake and alert. °· Take over-the-counter and prescription medicines only as told by your health care provider. °· If you smoke, do not smoke without supervision. °· Keep all follow-up visits as told by your health care provider. This is important. °Contact a health care provider if: °· You keep feeling nauseous or you keep vomiting. °· You feel light-headed. °· You develop a rash. °· You have a fever. °Get help right away if: °· You have trouble breathing. °This information is not intended to replace advice given to you by your health care provider. Make sure you discuss any questions you  have with your health care provider. °Document Released: 11/10/2012 Document Revised: 06/25/2015 Document Reviewed: 05/12/2015 °Elsevier Interactive Patient Education © 2017 [employer] Inc[employer] ° ° °Implanted Port Insertion, Care After °This sheet gives you information about how to care for yourself after your procedure. Your health care provider may also give you more specific instructions. If you have problems or questions, contact your health care provider. °What can I expect after the procedure? °After your procedure, it is common to have: °· Discomfort at the port insertion site. °· Bruising on the skin over the port. This should improve over 3-4 days. °Follow these instructions at home: °Port care  °· After your port is placed, you will get a manufacturer's information card. The card has information about your port. Keep this card with you at all times. °· Take care of the port as told by your health care provider. Ask your health care provider if you or a family member can get training for taking care of the port at home. A home health care nurse may also take care of the port. °· Make sure to remember what type of port you have. °Incision care  °· Follow instructions from your health care provider about how to take care of your port insertion site. Make sure you: °¨ Wash your hands with soap and water before you change your bandage (dressing). If soap and water are not available, use hand sanitizer. °¨ Change your dressing as told by your health care provider. °¨ Leave stitches (sutures), skin glue, or adhesive strips in place. These skin   closures may need to stay in place for 2 weeks or longer. If adhesive strip edges start to loosen and curl up, you may trim the loose edges. Do not remove adhesive strips completely unless your health care provider tells you to do that. °· Check your port insertion site every day for signs of infection. Check for: °¨ More redness, swelling, or pain. °¨ More fluid or  blood. °¨ Warmth. °¨ Pus or a bad smell. °General instructions  °· Do not take baths, swim, or use a hot tub until your health care provider approves. °· Do not lift anything that is heavier than 10 lb (4.5 kg) for a week, or as told by your health care provider. °· Ask your health care provider when it is okay to: °¨ Return to work or school. °¨ Resume usual physical activities or sports. °· Do not drive for 24 hours if you were given a medicine to help you relax (sedative). °· Take over-the-counter and prescription medicines only as told by your health care provider. °· Wear a medical alert bracelet in case of an emergency. This will tell any health care providers that you have a port. °· Keep all follow-up visits as told by your health care provider. This is important. °Contact a health care provider if: °· You cannot flush your port with saline as directed, or you cannot draw blood from the port. °· You have a fever or chills. °· You have more redness, swelling, or pain around your port insertion site. °· You have more fluid or blood coming from your port insertion site. °· Your port insertion site feels warm to the touch. °· You have pus or a bad smell coming from the port insertion site. °Get help right away if: °· You have chest pain or shortness of breath. °· You have bleeding from your port that you cannot control. °Summary °· Take care of the port as told by your health care provider. °· Change your dressing as told by your health care provider. °· Keep all follow-up visits as told by your health care provider. °This information is not intended to replace advice given to you by your health care provider. Make sure you discuss any questions you have with your health care provider. °Document Released: 11/10/2012 Document Revised: 12/12/2015 Document Reviewed: 12/12/2015 °Elsevier Interactive Patient Education © 2017 Elsevier Inc. ° °

## 2016-04-10 NOTE — Consult Note (Signed)
Chief Complaint: Patient was seen in consultation today for Port-A-Cath and percutaneous gastrostomy tube placements  Referring Physician(s): Ennever,Peter R  Supervising Physician: Markus Daft  Patient Status: [facility] - Out-pt  History of Present Illness: Bradley Meadows is a 76 y.o. 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 He presents today for Port-A-Cath as well as percutaneous gastrostomy tube placements prior to treatment.  Past Medical History:  Diagnosis Date  . Arthritis   . Asthma   . CHF (congestive heart failure) (HCC)    ef=45-50%  . COPD (chronic obstructive pulmonary disease) ([place])    home O2  . Coronary artery disease    40-50% mLAD, 40% pRCA, 30% mRCA, occluded CX with retrograde filling 06/01/06 cath  . GERD (gastroesophageal reflux disease)    ulcers  . Goals of care, counseling/discussion 04/07/2016  . Hyperlipidemia   . Hypertension   . Lung cancer, primary, 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, right ([place]) 04/07/2016  . PVC's (premature ventricular contractions)    2017  . Squamous cell lung cancer, right ([place]) 04/07/2016    Past Surgical History:  Procedure Laterality Date  . ESOPHAGOGASTRODUODENOSCOPY (EGD) WITH PROPOFOL N/A 02/26/2016   Procedure: ESOPHAGOGASTRODUODENOSCOPY (EGD) WITH PROPOFOL;  Surgeon: Irene Shipper, MD;  Location: [facility];  Service: Endoscopy;  Laterality: N/A;  . EXPLORATORY LAPAROTOMY     for ulcer  "washed me out with a hose pipe"  . EYE SURGERY     cataracts  . HERNIA REPAIR    . JOINT REPLACEMENT     knee both  . VIDEO BRONCHOSCOPY WITH ENDOBRONCHIAL ULTRASOUND N/A 03/26/2016   Procedure: VIDEO BRONCHOSCOPY WITH ENDOBRONCHIAL ULTRASOUND;  Surgeon: Collene Gobble, MD;  Location: [facility] OR;  Service: Thoracic;  Laterality: N/A;     Allergies: No known allergies  Medications: Prior to Admission medications   Medication Sig Start Date End Date Taking? Authorizing Provider  ALPRAZolam Duanne Moron) 0.5 MG tablet Take one tablet 30 minutes prior to scan. 04/02/16   Collene Gobble, MD  aspirin 81 MG tablet Take 1 tablet (81 mg total) by mouth daily. 08/29/10   Carolin Guernsey, MD  atorvastatin (LIPITOR) 40 MG tablet TAKE 1 TABLET(40 MG) BY MOUTH DAILY 04/07/16   Susy Frizzle, MD  HYDROcodone-acetaminophen (NORCO/VICODIN) 5-325 MG tablet Take 1 tablet by mouth 2 (two) times daily as needed for moderate pain. 03/26/16   Collene Gobble, MD  Ipratropium-Albuterol (COMBIVENT RESPIMAT) 20-100 MCG/ACT AERS respimat Inhale 2 puffs into the lungs every 6 (six) hours as needed for wheezing.    Historical Provider, MD  ipratropium-albuterol (DUONEB) 0.5-2.5 (3) MG/3ML SOLN USE 1 VIAL VIA NEBULIZER EVERY 6 HOURS AS NEEDED FOR SHORTNESS OF BREATH OR WHEEZING 03/26/16   Deneise Lever, MD  metoprolol tartrate (LOPRESSOR) 25 MG tablet Take 1 tablet (25 mg total) by mouth 2 (two) times daily. 03/26/16   Collene Gobble, MD  Multiple Vitamin (MULTIVITAMIN) tablet Take 1 tablet by mouth daily. Centrum Silver    Historical Provider, MD  omeprazole (PRILOSEC) 20 MG capsule TAKE ONE CAPSULE BY MOUTH EVERY DAY 12/12/15   Susy Frizzle, MD  sucralfate (CARAFATE) 1 GM/10ML suspension Take 10 mLs (1 g total) by mouth 4 (four) times daily -  with meals and at bedtime. 04/07/16   Volanda Napoleon, MD  Tiotropium  Bromide-Olodaterol (STIOLTO RESPIMAT) 2.5-2.5 MCG/ACT AERS Inhale 2 puffs into the lungs daily. 04/19/15   Susy Frizzle, MD     Family History  Problem Relation Age of Onset  . Arthritis Mother   . COPD Mother   . Alcohol abuse Father   . Breast cancer Sister   . Heart disease Paternal Grandfather   . Hyperlipidemia Sister     Social History   Social History  . Marital status: Divorced    Spouse name: N/A  . Number of children: 3  .  Years of education: 9   Occupational History  . RetiredAnimal nutritionist Retired   Social History Main Topics  . Smoking status: Former Smoker    Packs/day: 1.50    Years: 30.00    Types: Cigarettes    Quit date: 09/29/2015  . Smokeless tobacco: Never Used  . Alcohol use No  . Drug use: No  . Sexual activity: Not Currently   Other Topics Concern  . Not on file   Social History Narrative   Grew up in [place] area.    Left school after 9th grade. No father and had to work.    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       Health Care POA:    Emergency Contact: sister, Hector Brunswick (h) [phone]   End of Life Plan:    Who lives with you: self   Any pets: Lorrin Goodell   Diet: Pt limits sugars and starches and focuses on vegetables an protein. Pt has lost 50 lbs over several years.   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   Seatbelts: Pt reports wearing seatbelt when in vehicle.   Sun Exposure/Protection: Pt does not use sun protectin.   Hobbies: Racing, TV, gardening, Architect            Review of Systems denies fever, HA, sig CP, abd pain, N/V ; has had dysphagia with occ "gagging", chronic dyspnea on home O2, occ cough, back pain, occ blood -tinged sputum  Vital Signs: BP 123/76  HR 72 R 18  TEMP 98.8  O2 SATS 97% RA    Physical Exam  Constitutional: He is oriented to person, place, and time. He appears well-developed and well-nourished.  Cardiovascular: Normal rate and regular rhythm.   Pulmonary/Chest: Effort normal.  Few insp wheezes  Abdominal: Soft. Bowel sounds are normal. There is no tenderness.  Musculoskeletal: He exhibits edema.  Neurological: He is alert and oriented to person, place, and time.     Mallampati Score:     Imaging: No results found.  Labs:  CBC:  Recent Labs  04/25/15 0825 08/14/15 1255 03/24/16 0832 03/26/16 0721 04/07/16 1310  WBC 6.9 7.9 7.5  --   7.7  HGB 14.9 13.6 11.6* 11.6* 12.4*  HCT 42.6 38.8 33.9* 34.0* 35.5*  PLT 168 205 179  --  209    COAGS: No results for input(s): INR, APTT in the last 8760 hours.  BMP:  Recent Labs  04/25/15 0825 08/14/15 1255 03/24/16 0832 03/26/16 0721 04/07/16 1310  NA 131* 127* 127* 131* 133  K 4.6 4.1 3.4* 3.4* 4.3  CL 92* 90* 89*  --  89*  CO2 '29 22 28  '$ --  31  GLUCOSE 85 93 123* 105* 127*  BUN 8 8 5*  --  9  CALCIUM 9.4 9.2 9.2  --  9.6  CREATININE 0.75 0.81 0.73  --  0.7  GFRNONAA >89 87 >60  --   --   GFRAA >89 >89 >60  --   --     LIVER FUNCTION TESTS:  Recent Labs  04/25/15 0825 08/14/15 1255 04/07/16 1310  BILITOT 0.8 0.8 0.80  AST '16 16 24  '$ ALT '12 12 16  '$ ALKPHOS 60 70 58  PROT 6.3 6.0* 6.2*  ALBUMIN 4.1 3.9 3.8    TUMOR MARKERS: No results for input(s): AFPTM, CEA, CA199, CHROMGRNA in the last 8760 hours.  Assessment and Plan: 76 y.o. 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 He presents today for Port-A-Cath as well as percutaneous gastrostomy tube placements prior to treatment.Risks and benefits discussed with the patient/sister including, but not limited to bleeding, infection, pneumothorax, or fibrin sheath development, injury to adjacent structures and need for additional procedures. All of the patient's questions were answered, patient is agreeable to proceed. Consent signed and in chart.Labs pending.      Thank you for this interesting consult.  I greatly enjoyed meeting JANSEL VONSTEIN and look forward to participating in their care.  A copy of this report was sent to the requesting provider on this date.  Electronically Signed: D. Rowe Robert 04/10/2016, 12:46 PM   I spent a total of 25 minutes in face to face in clinical  consultation, greater than 50% of which was counseling/coordinating care for Port-A-Cath and percutaneous gastrostomy tube placements

## 2016-04-10 NOTE — Sedation Documentation (Addendum)
Vital signs stable. NAD

## 2016-04-11 ENCOUNTER — Other Ambulatory Visit: Payer: Medicare Other

## 2016-04-11 ENCOUNTER — Other Ambulatory Visit: Payer: Self-pay | Admitting: Hematology & Oncology

## 2016-04-11 ENCOUNTER — Telehealth: Payer: Self-pay

## 2016-04-11 ENCOUNTER — Encounter (HOSPITAL_COMMUNITY): Payer: Self-pay | Admitting: Diagnostic Radiology

## 2016-04-11 DIAGNOSIS — Z7189 Other specified counseling: Secondary | ICD-10-CM

## 2016-04-11 DIAGNOSIS — C3491 Malignant neoplasm of unspecified part of right bronchus or lung: Secondary | ICD-10-CM

## 2016-04-11 DIAGNOSIS — C3400 Malignant neoplasm of unspecified main bronchus: Secondary | ICD-10-CM

## 2016-04-11 MED ORDER — LIDOCAINE-PRILOCAINE 2.5-2.5 % EX CREA: TOPICAL_CREAM | CUTANEOUS | 0 refills | Status: DC

## 2016-04-11 NOTE — Telephone Encounter (Signed)
Sister Remo Lipps called asking about port care. Described removing bulky dressing tonight, leaving steri strips and dermabond on skin. Described minimal water on port site until incision heals. They do have papers about no showers or soaks. Will rx emla cream for port.

## 2016-04-16 ENCOUNTER — Other Ambulatory Visit: Payer: Self-pay | Admitting: Radiology

## 2016-04-16 NOTE — Progress Notes (Signed)
Thoracic Location of Tumor / Histology:  Clinical stage IIIB  (T4N2M0) vs stage IV (T4N2M1) squamous cell carcinoma of the right lung with 3.9 cm hypermetabolic middle mediastinal mass along the posterior aspect of the distal trachea and subcarinal region  Patient presented with symptoms of: dysphagia and 50 pounds over the past 4 or 5 months   Biopsies revealed:   03/26/16 Diagnosis 1. Bronchus, biopsy, Left main stem - SQUAMOUS CELL CARCINOMA. 2. Trachea, biopsy, Main carina - SQUAMOUS CELL CARCINOMA.  Diagnosis FINE NEEDLE ASPIRATION, EBUS, CARINAL MASS, A (SPECIMEN 1 OF 3, COLLECTED 03/26/16): RARE ATYPICAL CELLS PRESENT.  Diagnosis FINE NEEDLE ASPIRATION, EBUS, LEFT MAINSTEM BRUSHING, B (SPECIMEN 2 OF 3, COLLECTED 03/26/16): MALIGNANT CELLS CONSISTENT WITH NON-SMALL CELL CARCINOMA.  Diagnosis FINE NEEDLE ASPIRATION, EBUS, MAIN CARINA BRUSHINGS, C (SPECIMEN 3 OF 3, COLLECTED ON 03/26/16): MALIGNANT CELLS CONSISTENT WITH NON-SMALL CELL CARCINOMA, SEE COMMENT.  Tobacco/Marijuana/Snuff/ETOH use: former smoker, smoked 1.5 ppd for 30 years, denies ETOH, marijuana or snuff use.  Past/Anticipated interventions by cardiothoracic surgery, if any: 03/26/16 - Procedure: VIDEO BRONCHOSCOPY WITH ENDOBRONCHIAL ULTRASOUND;  Surgeon: Collene Gobble, MD  Past/Anticipated interventions by medical oncology, if any: Cisplatin/Etoposide q 3 weeks and Etoposide Day2, 3  Every 3 weeks planned to start Monday March 19th  Signs/Symptoms  Weight changes, if any: yes  Respiratory complaints, if any: Yes, uses 3 liters with exertion in the home  Hemoptysis, if any: Yes in mornings  Pain issues, if any:  Yes generalized from arthritis and some associated with indigestion  SAFETY ISSUES:  Prior radiation? No  Pacemaker/ICD? No   Possible current pregnancy?no  Is the patient on methotrexate? No  Current Complaints / other details:   Patient complains of mouth irritation, upon observation patient  has some thrush present.  Vitals:   04/17/16 1406  BP: 132/84  Pulse: 70  Resp: 20  Temp: 98 F (36.7 C)  TempSrc: Oral  SpO2: 99%  Weight: 189 lb 12.8 oz (86.1 kg)    Wt Readings from Last 3 Encounters:  04/17/16 189 lb 12.8 oz (86.1 kg)  04/07/16 180 lb 1.9 oz (81.7 kg)  04/05/16 185 lb (83.9 kg)

## 2016-04-17 ENCOUNTER — Other Ambulatory Visit: Payer: Self-pay | Admitting: Radiology

## 2016-04-17 ENCOUNTER — Ambulatory Visit
Admission: RE | Admit: 2016-04-17 | Discharge: 2016-04-17 | Disposition: A | Discharge status: Home / Self Care | Payer: Medicare Other | Source: Ambulatory Visit | Attending: Radiation Oncology | Admitting: Radiation Oncology

## 2016-04-17 ENCOUNTER — Other Ambulatory Visit: Payer: Self-pay | Admitting: *Deleted

## 2016-04-17 ENCOUNTER — Other Ambulatory Visit: Payer: Self-pay | Admitting: Student

## 2016-04-17 ENCOUNTER — Encounter: Payer: Self-pay | Admitting: *Deleted

## 2016-04-17 ENCOUNTER — Encounter: Payer: Self-pay | Admitting: Radiation Oncology

## 2016-04-17 ENCOUNTER — Ambulatory Visit: Payer: Medicare Other | Admitting: Nutrition

## 2016-04-17 ENCOUNTER — Other Ambulatory Visit: Payer: Medicare Other

## 2016-04-17 ENCOUNTER — Other Ambulatory Visit: Payer: Self-pay | Admitting: Hematology & Oncology

## 2016-04-17 ENCOUNTER — Other Ambulatory Visit: Payer: Self-pay | Admitting: General Surgery

## 2016-04-17 DIAGNOSIS — Z8261 Family history of arthritis: Secondary | ICD-10-CM | POA: Diagnosis not present

## 2016-04-17 DIAGNOSIS — Z8249 Family history of ischemic heart disease and other diseases of the circulatory system: Secondary | ICD-10-CM | POA: Diagnosis not present

## 2016-04-17 DIAGNOSIS — C3491 Malignant neoplasm of unspecified part of right bronchus or lung: Secondary | ICD-10-CM | POA: Diagnosis not present

## 2016-04-17 DIAGNOSIS — J449 Chronic obstructive pulmonary disease, unspecified: Secondary | ICD-10-CM | POA: Diagnosis not present

## 2016-04-17 DIAGNOSIS — Z811 Family history of alcohol abuse and dependence: Secondary | ICD-10-CM | POA: Diagnosis not present

## 2016-04-17 DIAGNOSIS — I110 Hypertensive heart disease with heart failure: Secondary | ICD-10-CM | POA: Insufficient documentation

## 2016-04-17 DIAGNOSIS — I509 Heart failure, unspecified: Secondary | ICD-10-CM | POA: Insufficient documentation

## 2016-04-17 DIAGNOSIS — Z9981 Dependence on supplemental oxygen: Secondary | ICD-10-CM | POA: Diagnosis not present

## 2016-04-17 DIAGNOSIS — Z7982 Long term (current) use of aspirin: Secondary | ICD-10-CM | POA: Insufficient documentation

## 2016-04-17 DIAGNOSIS — Z803 Family history of malignant neoplasm of breast: Secondary | ICD-10-CM | POA: Diagnosis not present

## 2016-04-17 DIAGNOSIS — K219 Gastro-esophageal reflux disease without esophagitis: Secondary | ICD-10-CM | POA: Diagnosis not present

## 2016-04-17 DIAGNOSIS — F17211 Nicotine dependence, cigarettes, in remission: Secondary | ICD-10-CM | POA: Diagnosis not present

## 2016-04-17 DIAGNOSIS — Z825 Family history of asthma and other chronic lower respiratory diseases: Secondary | ICD-10-CM | POA: Insufficient documentation

## 2016-04-17 DIAGNOSIS — Z79899 Other long term (current) drug therapy: Secondary | ICD-10-CM | POA: Insufficient documentation

## 2016-04-17 DIAGNOSIS — R1310 Dysphagia, unspecified: Secondary | ICD-10-CM | POA: Diagnosis not present

## 2016-04-17 DIAGNOSIS — R490 Dysphonia: Secondary | ICD-10-CM | POA: Diagnosis not present

## 2016-04-17 DIAGNOSIS — Z510 Encounter for antineoplastic radiation therapy: Secondary | ICD-10-CM | POA: Diagnosis not present

## 2016-04-17 DIAGNOSIS — Z87891 Personal history of nicotine dependence: Secondary | ICD-10-CM | POA: Diagnosis not present

## 2016-04-17 DIAGNOSIS — I2510 Atherosclerotic heart disease of native coronary artery without angina pectoris: Secondary | ICD-10-CM | POA: Insufficient documentation

## 2016-04-17 DIAGNOSIS — E785 Hyperlipidemia, unspecified: Secondary | ICD-10-CM | POA: Insufficient documentation

## 2016-04-17 DIAGNOSIS — C3401 Malignant neoplasm of right main bronchus: Secondary | ICD-10-CM | POA: Diagnosis not present

## 2016-04-17 MED ORDER — DEXAMETHASONE 4 MG PO TABS: ORAL_TABLET | ORAL | 1 refills | Status: DC

## 2016-04-17 MED ORDER — ONDANSETRON HCL 8 MG PO TABS: 8.0000 mg | ORAL_TABLET | Freq: Two times a day (BID) | ORAL | 1 refills | Status: DC | PRN

## 2016-04-17 MED ORDER — ALPRAZOLAM 0.5 MG PO TABS: 0.5000 mg | ORAL_TABLET | Freq: Three times a day (TID) | ORAL | 0 refills | Status: DC | PRN

## 2016-04-17 MED ORDER — PROCHLORPERAZINE MALEATE 10 MG PO TABS: 10.0000 mg | ORAL_TABLET | Freq: Four times a day (QID) | ORAL | 1 refills | Status: DC | PRN

## 2016-04-17 NOTE — Progress Notes (Signed)
76 year old male diagnosed with lung cancer 04/07/2016.   He is a patient of Dr.Ennever. He will be receiving chemotherapy and radiation therapy here at [facility].  Past medical history includes hypertension, hyperlipidemia, GERD, CAD, COPD, CHF, arthritis, tobacco.  Medications include Xanax, Lipitor, multivitamin, Lopressor or Prilosec, and Carafate.  Labs include prealbumin 18, glucose 127, albumin 3.8.  Height: 68 inches. Weight: 189.8 pounds March 15. Weight 180 pounds on March 5. Usual body weight: 232 pounds March 2017. BMI: 27.94.  Patient will receive feeding tube tomorrow. He is tolerating pured diet with thick liquids. Reports he drinks between 4 and 5 boost daily. He tolerates yogurt. Patient has been educated on PEG care and feeding however requests further education after tube placement tomorrow.  Estimated nutrition needs: 2150-2577 calories, 112-128 grams protein, 2.2 L fluid.  Nutrition diagnosis:  Unintended weight loss related to lung cancer diagnosis as evidenced by 19% weight loss over one year.  Intervention: Patient educated to continue oral nutrition supplements.  4-5 bottles daily. Provided one complementary case of Ensure Plus along with coupons. Educated patient on strategies for consuming soft pureed foods for increased variety. Briefly reviewed feeding tube education and encouraged patient to flush feeding tube with water on a daily basis. Encouraged patient to take as much food by mouth as possible before we begin using PEG for nutrition support. Questions were answered.  Teach back method used.  Contact information provided.  Monitoring, evaluation, goals: Patient will tolerate adequate calories and protein to minimize loss of lean body mass. Will add tube feedings once patient is unable to consume adequate calories and protein.  Next visit: Tuesday, March 20, during infusion.  **Disclaimer: This note was dictated with voice  recognition software. Similar sounding words can inadvertently be transcribed and this note may contain transcription errors which may not have been corrected upon publication of note.**

## 2016-04-17 NOTE — Progress Notes (Signed)
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 male who presented with dysphagia and a 50 pound weight loss over 4-5 months.  Patient underwent an upper endoscopy in January revealing some extrinsic compression. Chest CT on 02/13/16 showed an esophageal mass highly worrisome for esophageal carcinoma. There was an associated mass effect on and moderate narrowing of the lower trachea and bilateral mainstem bronchi. There was also a 5 mm subpleural right lower lobe nodule. PET scan on 03/11/16 showed a 3.9 cm hypermetabolic middle mediastinal mass along the posterior aspect of the distal trachea and subcarinal region. Differential considerations include primary bronchogenic carcinoma and lymphoma. There was also a small asymmetric focus of hypermetabolic activity in the right hilum measuring 1.4 cm as well as a hypermetabolic 5 mm subpleural right lower lobe pulmonary nodule. Patient underwent a video bronchoscopy with endobronchial ultrasound on 03/26/16 by Dr. Lamonte Sakai. Bronchoscopy report showed a normal proximal trachea. Thickening was noted at the main carina. Ultrasound showed a large mass posterior to the distal trachea above the level of the main carina. Biopsy on 03/26/16 revealed squamous cell carcinoma. Patient is planned to start chemotherapy on 04/21/16. Anticipate Cisplatin/Etoposide every 3 weeks and Etoposide on day 2 and 3.  Patient complains of a 50 pound weight loss. He reports using 3L/min of oxygen via nasal cannula all the time at home for the past several months. He also reports using inhalers for the past year.  Patient reports he can swallow soft foods and liquids if he swallows slowly. Patient notes hemoptysis in the morning. Patient reports cough and hoarseness starting in January. He reports generalized arthritis and indigestion pains. Patient also complained of mouth irritation.    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) (04/07/2016); PVC's (premature ventricular contractions); and Squamous cell lung cancer, right ([place]) (04/07/2016).    PAST SURGICAL HISTORY: Past Surgical History:  Procedure Laterality Date  . ESOPHAGOGASTRODUODENOSCOPY (EGD) WITH PROPOFOL N/A 02/26/2016   Procedure: ESOPHAGOGASTRODUODENOSCOPY (EGD) WITH PROPOFOL;  Surgeon: Irene Shipper, MD;  Location: [facility] ENDOSCOPY;  Service: Endoscopy;  Laterality: N/A;  . EXPLORATORY LAPAROTOMY     for ulcer  "washed me out with a hose pipe"  . EYE SURGERY     cataracts  . HERNIA REPAIR    . IR GENERIC HISTORICAL  04/10/2016   IR US GUIDE VASC ACCESS RIGHT 04/10/2016 Markus Daft, MD [facility]-INTER[facility]  . IR GENERIC HISTORICAL  04/10/2016   IR FLUORO GUIDE PORT INSERTION RIGHT 04/10/2016 Markus Daft, MD [facility]  . IR GENERIC HISTORICAL  04/10/2016   IR FLUORO RM 30-60 MIN 04/10/2016 Markus Daft, MD [facility]  . JOINT REPLACEMENT     knee both  . PORTACATH PLACEMENT Right 04/10/2016  . VIDEO BRONCHOSCOPY WITH ENDOBRONCHIAL ULTRASOUND N/A 03/26/2016   Procedure: VIDEO BRONCHOSCOPY WITH ENDOBRONCHIAL ULTRASOUND;  Surgeon: Collene Gobble, MD;  Location: [facility] OR;  Service: Thoracic;  Laterality: N/A;    FAMILY HISTORY: family history includes Alcohol abuse in his father; Arthritis in his mother;  Breast cancer in his sister; COPD in his mother; Heart disease in his paternal grandfather; Hyperlipidemia in his sister.  SOCIAL HISTORY:  reports that he quit smoking about 6 months ago. His smoking use included Cigarettes. He has a 45.00 pack-year smoking history. He has never used smokeless tobacco. He reports that he does not drink alcohol or use drugs.  ALLERGIES: No known allergies  MEDICATIONS:  Current Outpatient Prescriptions  Medication Sig Dispense Refill  . aspirin 81 MG tablet Take 1 tablet (81 mg total) by mouth daily. 1 tablet 3  . atorvastatin (LIPITOR) 40 MG tablet TAKE 1 TABLET(40 MG) BY MOUTH DAILY 90 tablet 1  . HYDROcodone-acetaminophen (NORCO/VICODIN) 5-325 MG tablet Take 1 tablet by mouth 2 (two) times daily as needed for moderate pain.    . Ipratropium-Albuterol (COMBIVENT RESPIMAT) 20-100 MCG/ACT AERS respimat Inhale 2 puffs into the lungs every 6 (six) hours as needed for wheezing.    [name] ipratropium-albuterol (DUONEB) 0.5-2.5 (3) MG/3ML SOLN USE 1 VIAL VIA NEBULIZER EVERY 6 HOURS AS NEEDED FOR SHORTNESS OF BREATH OR WHEEZING 990 mL 0  . lidocaine-prilocaine (EMLA) cream Apply to port 1-2 hours before needle stick. Cover with plastic wrap. 30 g 0  . metoprolol tartrate (LOPRESSOR) 25 MG tablet Take 1 tablet (25 mg total) by mouth 2 (two) times daily.    . Multiple Vitamin (MULTIVITAMIN) tablet Take 1 tablet by mouth daily. Centrum Silver    . sucralfate (CARAFATE) 1 GM/10ML suspension Take 10 mLs (1 g total) by mouth 4 (four) times daily -  with meals and at bedtime. 840 mL 3  . Tiotropium Bromide-Olodaterol (STIOLTO RESPIMAT) 2.5-2.5 MCG/ACT AERS Inhale 2 puffs into the lungs daily. 1 Inhaler 11  . ALPRAZolam (XANAX) 0.5 MG tablet Take one tablet 30 minutes prior to scan. (Patient not taking: Reported on 04/17/2016) 1 tablet 0  . dexamethasone (DECADRON) 4 MG tablet Take 2 tabs once a day on the day after day 8 cisplatin and then take 2 tabs two times a day for 2 days.  Take with food. 30 tablet 1  . omeprazole (PRILOSEC) 20 MG capsule TAKE ONE CAPSULE BY MOUTH EVERY DAY (Patient not taking: Reported on 04/17/2016) 90 capsule 3  . ondansetron (ZOFRAN) 8 MG tablet Take 1 tablet (8 mg total) by mouth 2 (two) times daily as needed. Start on the third day after cisplatin chemotherapy. 30 tablet 1  . prochlorperazine (COMPAZINE) 10 MG tablet Take 1 tablet (10 mg total) by mouth every 6 (six) hours as needed (Nausea or vomiting). 30 tablet 1   No current facility-administered medications for this encounter.     REVIEW OF SYSTEMS:  A 12 point review of systems is documented in the electronic medical record. This was obtained by the nursing staff. However, I reviewed this with the patient to discuss relevant findings and make appropriate changes.     PHYSICAL EXAM:  weight is 189 lb 12.8 oz (86.1 kg). His oral temperature is 98 F (36.7 C). His blood pressure is 132/84 and his pulse is 70. His respiration is 20 and oxygen saturation is 99%.   General: Alert and oriented, in no acute distress. Patient presents in a wheelchair. He was administered 3L/min of oxygen via nursing. Some hoarseness noted HEENT: Head is normocephalic. Extraocular movements are intact.  Neck: Neck is supple, no palpable  cervical or supraclavicular lymphadenopathy. Heart: Regular in rate and rhythm with no murmurs, rubs, or gallops. Chest: Clear to auscultation bilaterally, with no rhonchi, wheezes, or rales. Prolonged inspiratory and expiratory phase. Abdomen: Soft, nontender, nondistended, with no rigidity or guarding.  Extremities: No cyanosis or edema. Lymphatics: see Neck Exam Skin: No concerning lesions. Musculoskeletal: symmetric strength and muscle tone throughout. Neurologic: Cranial nerves II through XII are grossly intact. No obvious focalities. Speech is fluent. Coordination is intact. Psychiatric: Judgment and insight are intact. Affect is appropriate.  ECOG = 3  LABORATORY  DATA:  Lab Results  Component Value Date   WBC 7.3 04/10/2016   HGB 11.9 (L) 04/10/2016   HCT 34.2 (L) 04/10/2016   MCV 90.2 04/10/2016   PLT 203 04/10/2016   NEUTROABS 4.8 04/10/2016   Lab Results  Component Value Date   NA 133 04/07/2016   K 4.3 04/07/2016   CL 89 (L) 04/07/2016   CO2 31 04/07/2016   GLUCOSE 127 (H) 04/07/2016   CREATININE 0.7 04/07/2016   CALCIUM 9.6 04/07/2016      RADIOGRAPHY: Ir Fluoro Rm 30-60 Min  Result Date: 04/11/2016 INDICATION: Metastatic squamous cell carcinoma. Request for Port-A-Cath placement and gastrostomy tube prior to treatment. Patient has recent compression on the esophagus from a subcarinal mass. EXAM: FLUOROSCOPIC AND ULTRASOUND GUIDED PLACEMENT OF A SUBCUTANEOUS PORT FLUOROSCOPY FOR ATTEMPTED GASTROSTOMY TUBE PLACEMENT COMPARISON:  None. MEDICATIONS: Ancef 2 g; The antibiotic was administered within an appropriate time interval prior to skin puncture. Glucagon 1 mg ANESTHESIA/SEDATION: Versed 4.0 mg IV; Fentanyl 50 mcg IV; Moderate Sedation Time: The patient was continuously monitored during the procedure by the interventional radiology nurse under my direct supervision. FLUOROSCOPY TIME:  54 seconds, 78.9 mGy COMPLICATIONS: None immediate. PROCEDURE: The procedure, risks, benefits, and alternatives were explained to the patient. Questions regarding the procedure were encouraged and answered. The patient understands and consents to the procedure. Patient was placed supine on the interventional table. Ultrasound confirmed a patent right internal jugular vein. The right chest and neck were cleaned with a skin antiseptic and a sterile drape was placed. Maximal barrier sterile technique was utilized including caps, mask, sterile gowns, sterile gloves, sterile drape, hand hygiene and skin antiseptic. The right neck was anesthetized with 1% lidocaine. Small incision was made in the right neck with a blade. Micropuncture set was placed in the right internal  jugular vein with ultrasound guidance. The micropuncture wire was used for measurement purposes. The right chest was anesthetized with 1% lidocaine with epinephrine. #15 blade was used to make an incision and a subcutaneous port pocket was formed. [place] was assembled. Subcutaneous tunnel was formed with a stiff tunneling device. The port catheter was brought through the subcutaneous tunnel. The port was placed in the subcutaneous pocket. The micropuncture set was exchanged for a peel-away sheath. The catheter was placed through the peel-away sheath and the tip was positioned at the superior cavoatrial junction. Catheter placement was confirmed with fluoroscopy. The port was accessed and flushed with heparinized saline. The port pocket was closed using two layers of absorbable sutures and Dermabond. The vein skin site was closed using a single layer of absorbable suture and Dermabond. Sterile dressings were applied. Patient tolerated the procedure well without an immediate complication. Ultrasound and fluoroscopic images were taken and saved for this procedure. Attention was directed to placement of a gastrostomy tube. A 5 French catheter and Bentson wire were advanced from the mouth to the stomach without difficulty using  fluoroscopic guidance. The stomach was inflated with gas. Unfortunately, there was no barium within the transverse colon. Stomach is located high in the stomach and has a transverse orientation. It was very difficult to confirm that no bowel was anterior to the stomach and previous CT imaging confirm that there has been bowel in this location previously. As resolve, percutaneous gastrostomy tube was not attempted. IMPRESSION: Placement of a subcutaneous port device. Catheter tip at the superior cavoatrial junction. Gastrostomy tube placement was aborted due to poor visualization of the transverse colon and high position of the stomach. Will discuss with the patient's oncologist about  scheduling an other gastrostomy tube procedure. Electronically Signed   By: Markus Daft M.D.   On: 04/10/2016 17:49   Ir US Guide Vasc Access Right  Result Date: 04/10/2016 INDICATION: Metastatic squamous cell carcinoma. Request for Port-A-Cath placement and gastrostomy tube prior to treatment. Patient has recent compression on the esophagus from a subcarinal mass. EXAM: FLUOROSCOPIC AND ULTRASOUND GUIDED PLACEMENT OF A SUBCUTANEOUS PORT FLUOROSCOPY FOR ATTEMPTED GASTROSTOMY TUBE PLACEMENT COMPARISON:  None. MEDICATIONS: Ancef 2 g; The antibiotic was administered within an appropriate time interval prior to skin puncture. Glucagon 1 mg ANESTHESIA/SEDATION: Versed 4.0 mg IV; Fentanyl 50 mcg IV; Moderate Sedation Time: The patient was continuously monitored during the procedure by the interventional radiology nurse under my direct supervision. FLUOROSCOPY TIME:  54 seconds, 93.7 mGy COMPLICATIONS: None immediate. PROCEDURE: The procedure, risks, benefits, and alternatives were explained to the patient. Questions regarding the procedure were encouraged and answered. The patient understands and consents to the procedure. Patient was placed supine on the interventional table. Ultrasound confirmed a patent right internal jugular vein. The right chest and neck were cleaned with a skin antiseptic and a sterile drape was placed. Maximal barrier sterile technique was utilized including caps, mask, sterile gowns, sterile gloves, sterile drape, hand hygiene and skin antiseptic. The right neck was anesthetized with 1% lidocaine. Small incision was made in the right neck with a blade. Micropuncture set was placed in the right internal jugular vein with ultrasound guidance. The micropuncture wire was used for measurement purposes. The right chest was anesthetized with 1% lidocaine with epinephrine. #15 blade was used to make an incision and a subcutaneous port pocket was formed. [place] was assembled. Subcutaneous  tunnel was formed with a stiff tunneling device. The port catheter was brought through the subcutaneous tunnel. The port was placed in the subcutaneous pocket. The micropuncture set was exchanged for a peel-away sheath. The catheter was placed through the peel-away sheath and the tip was positioned at the superior cavoatrial junction. Catheter placement was confirmed with fluoroscopy. The port was accessed and flushed with heparinized saline. The port pocket was closed using two layers of absorbable sutures and Dermabond. The vein skin site was closed using a single layer of absorbable suture and Dermabond. Sterile dressings were applied. Patient tolerated the procedure well without an immediate complication. Ultrasound and fluoroscopic images were taken and saved for this procedure. Attention was directed to placement of a gastrostomy tube. A 5 French catheter and Bentson wire were advanced from the mouth to the stomach without difficulty using fluoroscopic guidance. The stomach was inflated with gas. Unfortunately, there was no barium within the transverse colon. Stomach is located high in the stomach and has a transverse orientation. It was very difficult to confirm that no bowel was anterior to the stomach and previous CT imaging confirm that there has been bowel in this location previously. As  resolve, percutaneous gastrostomy tube was not attempted. IMPRESSION: Placement of a subcutaneous port device. Catheter tip at the superior cavoatrial junction. Gastrostomy tube placement was aborted due to poor visualization of the transverse colon and high position of the stomach. Will discuss with the patient's oncologist about scheduling an other gastrostomy tube procedure. Electronically Signed   By: Markus Daft M.D.   On: 04/10/2016 17:49   Ir Fluoro Guide Port Insertion Right  Result Date: 04/10/2016 INDICATION: Metastatic squamous cell carcinoma. Request for Port-A-Cath placement and gastrostomy tube prior to  treatment. Patient has recent compression on the esophagus from a subcarinal mass. EXAM: FLUOROSCOPIC AND ULTRASOUND GUIDED PLACEMENT OF A SUBCUTANEOUS PORT FLUOROSCOPY FOR ATTEMPTED GASTROSTOMY TUBE PLACEMENT COMPARISON:  None. MEDICATIONS: Ancef 2 g; The antibiotic was administered within an appropriate time interval prior to skin puncture. Glucagon 1 mg ANESTHESIA/SEDATION: Versed 4.0 mg IV; Fentanyl 50 mcg IV; Moderate Sedation Time: The patient was continuously monitored during the procedure by the interventional radiology nurse under my direct supervision. FLUOROSCOPY TIME:  54 seconds, 23.5 mGy COMPLICATIONS: None immediate. PROCEDURE: The procedure, risks, benefits, and alternatives were explained to the patient. Questions regarding the procedure were encouraged and answered. The patient understands and consents to the procedure. Patient was placed supine on the interventional table. Ultrasound confirmed a patent right internal jugular vein. The right chest and neck were cleaned with a skin antiseptic and a sterile drape was placed. Maximal barrier sterile technique was utilized including caps, mask, sterile gowns, sterile gloves, sterile drape, hand hygiene and skin antiseptic. The right neck was anesthetized with 1% lidocaine. Small incision was made in the right neck with a blade. Micropuncture set was placed in the right internal jugular vein with ultrasound guidance. The micropuncture wire was used for measurement purposes. The right chest was anesthetized with 1% lidocaine with epinephrine. #15 blade was used to make an incision and a subcutaneous port pocket was formed. [place] was assembled. Subcutaneous tunnel was formed with a stiff tunneling device. The port catheter was brought through the subcutaneous tunnel. The port was placed in the subcutaneous pocket. The micropuncture set was exchanged for a peel-away sheath. The catheter was placed through the peel-away sheath and the tip was  positioned at the superior cavoatrial junction. Catheter placement was confirmed with fluoroscopy. The port was accessed and flushed with heparinized saline. The port pocket was closed using two layers of absorbable sutures and Dermabond. The vein skin site was closed using a single layer of absorbable suture and Dermabond. Sterile dressings were applied. Patient tolerated the procedure well without an immediate complication. Ultrasound and fluoroscopic images were taken and saved for this procedure. Attention was directed to placement of a gastrostomy tube. A 5 French catheter and Bentson wire were advanced from the mouth to the stomach without difficulty using fluoroscopic guidance. The stomach was inflated with gas. Unfortunately, there was no barium within the transverse colon. Stomach is located high in the stomach and has a transverse orientation. It was very difficult to confirm that no bowel was anterior to the stomach and previous CT imaging confirm that there has been bowel in this location previously. As resolve, percutaneous gastrostomy tube was not attempted. IMPRESSION: Placement of a subcutaneous port device. Catheter tip at the superior cavoatrial junction. Gastrostomy tube placement was aborted due to poor visualization of the transverse colon and high position of the stomach. Will discuss with the patient's oncologist about scheduling an other gastrostomy tube procedure. Electronically Signed   By:  Markus Daft M.D.   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 The patient is quite symptomatic from his disease in the central chest with swallowing difficulties and breathing issues. He may have metastatic disease to a gastrohepatic ligament lymph node. He has been unable to complete his brain MRI in light of his claustrophobia issues.  He would be a good candidate for an aggressive course of radiation therapy. I discussed the  course of treatment, side effects, and potential toxicities with the patient and his daughter. He appears to understand and wishes to proceed with treatment. A consent form was signed and a copy was placed in the patient's chart.  Patient's performance status is suboptimal at this time. We discussed consideration for a more palliative course of radiation therapy over approximately 4 weeks versus more curative approach of [redacted] weeks along with radiosensitizing chemotherapy. Patient and his daughter at the time wish to be aggressive with this treatment and will attempt a full 6 week course of treatment.  We will assess his tolerance to treatment on a weekly basis.  PLAN: Schedule CT simulation and treatment planning for 04/22/16 at 11 am. Anticipate treatment to begin on Thursday 3-22 with 5-6 weeks of radiation therapy. I prescribed xanax due to the patient's claustrophobia. He will start his chemotherapy early next week.       ------------------------------------------------  Blair Promise, PhD, MD  This document serves as a record of services personally performed by Gery Pray, MD. It was created on his behalf by Bethann Humble, a trained medical scribe. The creation of this record is based on the scribe's personal observations and the provider's statements to them. This document has been checked and approved by the attending provider.

## 2016-04-17 NOTE — Progress Notes (Signed)
Please see the Nurse Progress Note in the MD Initial Consult Encounter for this patient. 

## 2016-04-18 ENCOUNTER — Other Ambulatory Visit: Payer: Self-pay | Admitting: Hematology & Oncology

## 2016-04-18 ENCOUNTER — Ambulatory Visit (HOSPITAL_COMMUNITY)
Admission: RE | Admit: 2016-04-18 | Discharge: 2016-04-18 | Disposition: A | Discharge status: Home / Self Care | Payer: Medicare Other | Source: Ambulatory Visit | Attending: Hematology & Oncology | Admitting: Hematology & Oncology

## 2016-04-18 ENCOUNTER — Encounter (HOSPITAL_COMMUNITY): Payer: Self-pay

## 2016-04-18 DIAGNOSIS — I2510 Atherosclerotic heart disease of native coronary artery without angina pectoris: Secondary | ICD-10-CM | POA: Insufficient documentation

## 2016-04-18 DIAGNOSIS — C3491 Malignant neoplasm of unspecified part of right bronchus or lung: Secondary | ICD-10-CM

## 2016-04-18 DIAGNOSIS — C3490 Malignant neoplasm of unspecified part of unspecified bronchus or lung: Secondary | ICD-10-CM | POA: Diagnosis not present

## 2016-04-18 DIAGNOSIS — J449 Chronic obstructive pulmonary disease, unspecified: Secondary | ICD-10-CM | POA: Diagnosis not present

## 2016-04-18 DIAGNOSIS — K219 Gastro-esophageal reflux disease without esophagitis: Secondary | ICD-10-CM | POA: Diagnosis not present

## 2016-04-18 DIAGNOSIS — C3400 Malignant neoplasm of unspecified main bronchus: Secondary | ICD-10-CM

## 2016-04-18 DIAGNOSIS — Z5309 Procedure and treatment not carried out because of other contraindication: Secondary | ICD-10-CM | POA: Diagnosis not present

## 2016-04-18 DIAGNOSIS — Z7982 Long term (current) use of aspirin: Secondary | ICD-10-CM | POA: Insufficient documentation

## 2016-04-18 DIAGNOSIS — R1310 Dysphagia, unspecified: Secondary | ICD-10-CM | POA: Diagnosis not present

## 2016-04-18 DIAGNOSIS — E785 Hyperlipidemia, unspecified: Secondary | ICD-10-CM | POA: Insufficient documentation

## 2016-04-18 DIAGNOSIS — E46 Unspecified protein-calorie malnutrition: Secondary | ICD-10-CM | POA: Diagnosis not present

## 2016-04-18 DIAGNOSIS — I110 Hypertensive heart disease with heart failure: Secondary | ICD-10-CM | POA: Insufficient documentation

## 2016-04-18 DIAGNOSIS — Z7189 Other specified counseling: Secondary | ICD-10-CM

## 2016-04-18 DIAGNOSIS — I509 Heart failure, unspecified: Secondary | ICD-10-CM | POA: Insufficient documentation

## 2016-04-18 DIAGNOSIS — Z87891 Personal history of nicotine dependence: Secondary | ICD-10-CM | POA: Diagnosis not present

## 2016-04-18 HISTORY — PX: IR GENERIC HISTORICAL: IMG1180011

## 2016-04-18 LAB — CBC WITH DIFFERENTIAL/PLATELET
Basophils Absolute: 0 10*3/uL (ref 0.0–0.1)
Basophils Relative: 0 %
EOS ABS: 0.2 10*3/uL (ref 0.0–0.7)
EOS PCT: 2 %
HCT: 32 % — ABNORMAL LOW (ref 39.0–52.0)
Hemoglobin: 11.1 g/dL — ABNORMAL LOW (ref 13.0–17.0)
LYMPHS ABS: 1.1 10*3/uL (ref 0.7–4.0)
LYMPHS PCT: 17 %
MCH: 32.1 pg (ref 26.0–34.0)
MCHC: 34.7 g/dL (ref 30.0–36.0)
MCV: 92.5 fL (ref 78.0–100.0)
Monocytes Absolute: 0.7 10*3/uL (ref 0.1–1.0)
Monocytes Relative: 11 %
Neutro Abs: 4.4 10*3/uL (ref 1.7–7.7)
Neutrophils Relative %: 70 %
PLATELETS: 176 10*3/uL (ref 150–400)
RBC: 3.46 MIL/uL — ABNORMAL LOW (ref 4.22–5.81)
RDW: 13.9 % (ref 11.5–15.5)
WBC: 6.3 10*3/uL (ref 4.0–10.5)

## 2016-04-18 MED ORDER — CEFAZOLIN SODIUM-DEXTROSE 2-4 GM/100ML-% IV SOLN: 2.0000 g | INTRAVENOUS | Status: DC

## 2016-04-18 MED ORDER — FENTANYL CITRATE (PF) 100 MCG/2ML IJ SOLN
INTRAMUSCULAR | Status: DC
Start: 2016-04-18 — End: 2016-04-18
  Filled 2016-04-18: qty 6

## 2016-04-18 MED ORDER — CALCIUM CARBONATE ANTACID 500 MG PO CHEW
2.0000 | CHEWABLE_TABLET | ORAL | Status: DC | PRN
  Administered 2016-04-18: 400 mg via ORAL
  Filled 2016-04-18 (×4): qty 2

## 2016-04-18 MED ORDER — HEPARIN SOD (PORK) LOCK FLUSH 100 UNIT/ML IV SOLN
500.0000 [IU] | INTRAVENOUS | Status: AC | PRN
  Administered 2016-04-18: 500 [IU]
  Filled 2016-04-18: qty 5

## 2016-04-18 MED ORDER — GLUCAGON HCL RDNA (DIAGNOSTIC) 1 MG IJ SOLR
INTRAMUSCULAR | Status: AC
  Filled 2016-04-18: qty 1

## 2016-04-18 MED ORDER — MIDAZOLAM HCL 2 MG/2ML IJ SOLN
INTRAMUSCULAR | Status: AC
  Filled 2016-04-18: qty 6

## 2016-04-18 MED ORDER — SODIUM CHLORIDE 0.9 % IV SOLN
INTRAVENOUS | Status: DC
  Administered 2016-04-18: 10:00:00 via INTRAVENOUS

## 2016-04-18 MED ORDER — CEFAZOLIN SODIUM-DEXTROSE 2-4 GM/100ML-% IV SOLN
INTRAVENOUS | Status: AC
  Filled 2016-04-18: qty 100

## 2016-04-18 NOTE — Procedures (Signed)
Lung ca  Unable to place Gtube under fluoro because of high positioned stomach and adjacent dilated bowel.  rec surgical referral for Gtube insertion  D/w Ennever
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
## 2016-04-21 ENCOUNTER — Ambulatory Visit (HOSPITAL_BASED_OUTPATIENT_CLINIC_OR_DEPARTMENT_OTHER): Payer: Medicare Other

## 2016-04-21 VITALS — BP 112/72 | HR 82 | Temp 97.9°F | Resp 17

## 2016-04-21 DIAGNOSIS — Z5111 Encounter for antineoplastic chemotherapy: Secondary | ICD-10-CM

## 2016-04-21 DIAGNOSIS — C3491 Malignant neoplasm of unspecified part of right bronchus or lung: Secondary | ICD-10-CM | POA: Diagnosis not present

## 2016-04-21 MED ORDER — SODIUM CHLORIDE 0.9% FLUSH
10.0000 mL | INTRAVENOUS | Status: DC | PRN
  Administered 2016-04-21: 10 mL
  Filled 2016-04-21: qty 10

## 2016-04-21 MED ORDER — SODIUM CHLORIDE 0.9 % IV SOLN
Freq: Once | INTRAVENOUS | Status: AC
  Administered 2016-04-21: 14:00:00 via INTRAVENOUS
  Filled 2016-04-21: qty 5

## 2016-04-21 MED ORDER — SODIUM CHLORIDE 0.9 % IV SOLN
Freq: Once | INTRAVENOUS | Status: AC
  Administered 2016-04-21: 14:00:00 via INTRAVENOUS

## 2016-04-21 MED ORDER — SODIUM CHLORIDE 0.9 % IV SOLN
40.0000 mg/m2 | Freq: Once | INTRAVENOUS | Status: AC
  Administered 2016-04-21: 80 mg via INTRAVENOUS
  Filled 2016-04-21: qty 4

## 2016-04-21 MED ORDER — POTASSIUM CHLORIDE 2 MEQ/ML IV SOLN
Freq: Once | INTRAVENOUS | Status: AC
  Administered 2016-04-21: 11:00:00 via INTRAVENOUS
  Filled 2016-04-21: qty 10

## 2016-04-21 MED ORDER — PALONOSETRON HCL INJECTION 0.25 MG/5ML
INTRAVENOUS | Status: AC
  Filled 2016-04-21: qty 5

## 2016-04-21 MED ORDER — PALONOSETRON HCL INJECTION 0.25 MG/5ML
0.2500 mg | Freq: Once | INTRAVENOUS | Status: AC
  Administered 2016-04-21: 0.25 mg via INTRAVENOUS

## 2016-04-21 MED ORDER — HEPARIN SOD (PORK) LOCK FLUSH 100 UNIT/ML IV SOLN
500.0000 [IU] | Freq: Once | INTRAVENOUS | Status: AC | PRN
  Administered 2016-04-21: 500 [IU]
  Filled 2016-04-21: qty 5

## 2016-04-21 MED ORDER — SODIUM CHLORIDE 0.9 % IV SOLN
40.0000 mg/m2 | Freq: Once | INTRAVENOUS | Status: AC
  Administered 2016-04-21: 79 mg via INTRAVENOUS
  Filled 2016-04-21: qty 79

## 2016-04-21 NOTE — Progress Notes (Signed)
Per Dr Marin Olp, [place] to proceed with today's treatment with CBC from 3.16.2018 and CMP from 3.05.2018   Per Dr Marin Olp, [place] to proceed with treatment with urine OP of 137ms

## 2016-04-21 NOTE — Patient Instructions (Signed)
[facility] Discharge Instructions for Patients Receiving Chemotherapy  Today you received the following chemotherapy agents:  Cisplatin (platinol),  Etoposide (vepesid, VP 16)  To help prevent nausea and vomiting after your treatment, we encourage you to take your nausea medication as prescribed.   If you develop nausea and vomiting that is not controlled by your nausea medication, call the clinic.   BELOW ARE SYMPTOMS THAT SHOULD BE REPORTED IMMEDIATELY:  *FEVER GREATER THAN 100.5 F  *CHILLS WITH OR WITHOUT FEVER  NAUSEA AND VOMITING THAT IS NOT CONTROLLED WITH YOUR NAUSEA MEDICATION  *UNUSUAL SHORTNESS OF BREATH  *UNUSUAL BRUISING OR BLEEDING  TENDERNESS IN MOUTH AND THROAT WITH OR WITHOUT PRESENCE OF ULCERS  *URINARY PROBLEMS  *BOWEL PROBLEMS  UNUSUAL RASH Items with * indicate a potential emergency and should be followed up as soon as possible.  Feel free to call the clinic you have any questions or concerns. The clinic phone number is [phone].  Please show the [place] at check-in to the Emergency Department and triage nurse.     Etoposide, VP-16 injection What is this medicine? ETOPOSIDE, VP-16 (e toe POE side) is a chemotherapy drug. It is used to treat testicular cancer, lung cancer, and other cancers. This medicine may be used for other purposes; ask your health care provider or pharmacist if you have questions. COMMON BRAND NAME(S): Etopophos, Toposar, VePesid What should I tell my health care provider before I take this medicine? They need to know if you have any of these conditions: -infection -kidney disease -liver disease -low blood counts, like low white cell, platelet, or red cell counts -an unusual or allergic reaction to etoposide, other medicines, foods, dyes, or preservatives -pregnant or trying to get pregnant -breast-feeding How should I use this medicine? This medicine is for infusion into a vein. It is  administered in a hospital or clinic by a specially trained health care professional. Talk to your pediatrician regarding the use of this medicine in children. Special care may be needed. Overdosage: If you think you have taken too much of this medicine contact a poison control center or emergency room at once. NOTE: This medicine is only for you. Do not share this medicine with others. What if I miss a dose? It is important not to miss your dose. Call your doctor or health care professional if you are unable to keep an appointment. What may interact with this medicine? -aspirin -certain medications for seizures like carbamazepine, phenobarbital, phenytoin, valproic acid -cyclosporine -levamisole -warfarin This list may not describe all possible interactions. Give your health care provider a list of all the medicines, herbs, non-prescription drugs, or dietary supplements you use. Also tell them if you smoke, drink alcohol, or use illegal drugs. Some items may interact with your medicine. What should I watch for while using this medicine? Visit your doctor for checks on your progress. This drug may make you feel generally unwell. This is not uncommon, as chemotherapy can affect healthy cells as well as cancer cells. Report any side effects. Continue your course of treatment even though you feel ill unless your doctor tells you to stop. In some cases, you may be given additional medicines to help with side effects. Follow all directions for their use. Call your doctor or health care professional for advice if you get a fever, chills or sore throat, or other symptoms of a cold or flu. Do not treat yourself. This drug decreases your body's ability to fight infections. Try  to avoid being around people who are sick. This medicine may increase your risk to bruise or bleed. Call your doctor or health care professional if you notice any unusual bleeding. Talk to your doctor about your risk of cancer. You  may be more at risk for certain types of cancers if you take this medicine. Do not become pregnant while taking this medicine or for at least 6 months after stopping it. Women should inform their doctor if they wish to become pregnant or think they might be pregnant. Women of child-bearing potential will need to have a negative pregnancy test before starting this medicine. There is a potential for serious side effects to an unborn child. Talk to your health care professional or pharmacist for more information. Do not breast-feed an infant while taking this medicine. Men must use a latex condom during sexual contact with a woman while taking this medicine and for at least 4 months after stopping it. A latex condom is needed even if you have had a vasectomy. Contact your doctor right away if your partner becomes pregnant. Do not donate sperm while taking this medicine and for at least 4 months after you stop taking this medicine. Men should inform their doctors if they wish to father a child. This medicine may lower sperm counts. What side effects may I notice from receiving this medicine? Side effects that you should report to your doctor or health care professional as soon as possible: -allergic reactions like skin rash, itching or hives, swelling of the face, lips, or tongue -low blood counts - this medicine may decrease the number of white blood cells, red blood cells and platelets. You may be at increased risk for infections and bleeding. -signs of infection - fever or chills, cough, sore throat, pain or difficulty passing urine -signs of decreased platelets or bleeding - bruising, pinpoint red spots on the skin, black, tarry stools, blood in the urine -signs of decreased red blood cells - unusually weak or tired, fainting spells, lightheadedness -breathing problems -changes in vision -mouth or throat sores or ulcers -pain, redness, swelling or irritation at the injection site -pain, tingling,  numbness in the hands or feet -redness, blistering, peeling or loosening of the skin, including inside the mouth -seizures -vomiting Side effects that usually do not require medical attention (report to your doctor or health care professional if they continue or are bothersome): -diarrhea -hair loss -loss of appetite -nausea -stomach pain This list may not describe all possible side effects. Call your doctor for medical advice about side effects. You may report side effects to FDA at 1-800-FDA-1088. Where should I keep my medicine? This drug is given in a hospital or clinic and will not be stored at home. NOTE: This sheet is a summary. It may not cover all possible information. If you have questions about this medicine, talk to your doctor, pharmacist, or health care provider.  2018 Elsevier/Gold Standard (2015-01-12 11:53:23)    Cisplatin injection What is this medicine? CISPLATIN (SIS pla tin) is a chemotherapy drug. It targets fast dividing cells, like cancer cells, and causes these cells to die. This medicine is used to treat many types of cancer like bladder, ovarian, and testicular cancers. This medicine may be used for other purposes; ask your health care provider or pharmacist if you have questions. COMMON BRAND NAME(S): Platinol, Platinol -AQ What should I tell my health care provider before I take this medicine? They need to know if you have any of these conditions: -  blood disorders -hearing problems -kidney disease -recent or ongoing radiation therapy -an unusual or allergic reaction to cisplatin, carboplatin, other chemotherapy, other medicines, foods, dyes, or preservatives -pregnant or trying to get pregnant -breast-feeding How should I use this medicine? This drug is given as an infusion into a vein. It is administered in a hospital or clinic by a specially trained health care professional. Talk to your pediatrician regarding the use of this medicine in children.  Special care may be needed. Overdosage: If you think you have taken too much of this medicine contact a poison control center or emergency room at once. NOTE: This medicine is only for you. Do not share this medicine with others. What if I miss a dose? It is important not to miss a dose. Call your doctor or health care professional if you are unable to keep an appointment. What may interact with this medicine? -dofetilide -foscarnet -medicines for seizures -medicines to increase blood counts like filgrastim, pegfilgrastim, sargramostim -probenecid -pyridoxine used with altretamine -rituximab -some antibiotics like amikacin, gentamicin, neomycin, polymyxin B, streptomycin, tobramycin -sulfinpyrazone -vaccines -zalcitabine Talk to your doctor or health care professional before taking any of these medicines: -acetaminophen -aspirin -ibuprofen -ketoprofen -naproxen This list may not describe all possible interactions. Give your health care provider a list of all the medicines, herbs, non-prescription drugs, or dietary supplements you use. Also tell them if you smoke, drink alcohol, or use illegal drugs. Some items may interact with your medicine. What should I watch for while using this medicine? Your condition will be monitored carefully while you are receiving this medicine. You will need important blood work done while you are taking this medicine. This drug may make you feel generally unwell. This is not uncommon, as chemotherapy can affect healthy cells as well as cancer cells. Report any side effects. Continue your course of treatment even though you feel ill unless your doctor tells you to stop. In some cases, you may be given additional medicines to help with side effects. Follow all directions for their use. Call your doctor or health care professional for advice if you get a fever, chills or sore throat, or other symptoms of a cold or flu. Do not treat yourself. This drug decreases  your body's ability to fight infections. Try to avoid being around people who are sick. This medicine may increase your risk to bruise or bleed. Call your doctor or health care professional if you notice any unusual bleeding. Be careful brushing and flossing your teeth or using a toothpick because you may get an infection or bleed more easily. If you have any dental work done, tell your dentist you are receiving this medicine. Avoid taking products that contain aspirin, acetaminophen, ibuprofen, naproxen, or ketoprofen unless instructed by your doctor. These medicines may hide a fever. Do not become pregnant while taking this medicine. Women should inform their doctor if they wish to become pregnant or think they might be pregnant. There is a potential for serious side effects to an unborn child. Talk to your health care professional or pharmacist for more information. Do not breast-feed an infant while taking this medicine. Drink fluids as directed while you are taking this medicine. This will help protect your kidneys. Call your doctor or health care professional if you get diarrhea. Do not treat yourself. What side effects may I notice from receiving this medicine? Side effects that you should report to your doctor or health care professional as soon as possible: -allergic reactions like skin rash,  itching or hives, swelling of the face, lips, or tongue -signs of infection - fever or chills, cough, sore throat, pain or difficulty passing urine -signs of decreased platelets or bleeding - bruising, pinpoint red spots on the skin, black, tarry stools, nosebleeds -signs of decreased red blood cells - unusually weak or tired, fainting spells, lightheadedness -breathing problems -changes in hearing -gout pain -low blood counts - This drug may decrease the number of white blood cells, red blood cells and platelets. You may be at increased risk for infections and bleeding. -nausea and vomiting -pain,  swelling, redness or irritation at the injection site -pain, tingling, numbness in the hands or feet -problems with balance, movement -trouble passing urine or change in the amount of urine Side effects that usually do not require medical attention (report to your doctor or health care professional if they continue or are bothersome): -changes in vision -loss of appetite -metallic taste in the mouth or changes in taste This list may not describe all possible side effects. Call your doctor for medical advice about side effects. You may report side effects to FDA at 1-800-FDA-1088. Where should I keep my medicine? This drug is given in a hospital or clinic and will not be stored at home. NOTE: This sheet is a summary. It may not cover all possible information. If you have questions about this medicine, talk to your doctor, pharmacist, or health care provider.  2018 Elsevier/Gold Standard (2007-04-27 14:40:54)

## 2016-04-22 ENCOUNTER — Ambulatory Visit: Payer: Medicare Other

## 2016-04-22 ENCOUNTER — Ambulatory Visit (HOSPITAL_BASED_OUTPATIENT_CLINIC_OR_DEPARTMENT_OTHER): Payer: Medicare Other

## 2016-04-22 ENCOUNTER — Ambulatory Visit
Admission: RE | Admit: 2016-04-22 | Discharge: 2016-04-22 | Disposition: A | Discharge status: Home / Self Care | Payer: Medicare Other | Source: Ambulatory Visit | Attending: Radiation Oncology | Admitting: Radiation Oncology

## 2016-04-22 VITALS — BP 123/62 | HR 72 | Temp 98.0°F | Resp 16

## 2016-04-22 DIAGNOSIS — C3491 Malignant neoplasm of unspecified part of right bronchus or lung: Secondary | ICD-10-CM

## 2016-04-22 DIAGNOSIS — C3401 Malignant neoplasm of right main bronchus: Secondary | ICD-10-CM | POA: Diagnosis not present

## 2016-04-22 DIAGNOSIS — Z5111 Encounter for antineoplastic chemotherapy: Secondary | ICD-10-CM | POA: Diagnosis not present

## 2016-04-22 DIAGNOSIS — R1310 Dysphagia, unspecified: Secondary | ICD-10-CM | POA: Diagnosis not present

## 2016-04-22 DIAGNOSIS — Z79899 Other long term (current) drug therapy: Secondary | ICD-10-CM | POA: Diagnosis not present

## 2016-04-22 DIAGNOSIS — Z9981 Dependence on supplemental oxygen: Secondary | ICD-10-CM | POA: Diagnosis not present

## 2016-04-22 DIAGNOSIS — Z510 Encounter for antineoplastic radiation therapy: Secondary | ICD-10-CM | POA: Diagnosis not present

## 2016-04-22 DIAGNOSIS — Z7982 Long term (current) use of aspirin: Secondary | ICD-10-CM | POA: Diagnosis not present

## 2016-04-22 MED ORDER — SODIUM CHLORIDE 0.9 % IV SOLN
40.0000 mg/m2 | Freq: Once | INTRAVENOUS | Status: AC
  Administered 2016-04-22: 80 mg via INTRAVENOUS
  Filled 2016-04-22: qty 4

## 2016-04-22 MED ORDER — SODIUM CHLORIDE 0.9% FLUSH
10.0000 mL | INTRAVENOUS | Status: DC | PRN
  Administered 2016-04-22: 10 mL
  Filled 2016-04-22: qty 10

## 2016-04-22 MED ORDER — DEXAMETHASONE SODIUM PHOSPHATE 10 MG/ML IJ SOLN
INTRAMUSCULAR | Status: AC
  Filled 2016-04-22: qty 1

## 2016-04-22 MED ORDER — DEXAMETHASONE SODIUM PHOSPHATE 10 MG/ML IJ SOLN
10.0000 mg | Freq: Once | INTRAMUSCULAR | Status: AC
  Administered 2016-04-22: 10 mg via INTRAVENOUS

## 2016-04-22 MED ORDER — HEPARIN SOD (PORK) LOCK FLUSH 100 UNIT/ML IV SOLN
500.0000 [IU] | Freq: Once | INTRAVENOUS | Status: AC | PRN
  Administered 2016-04-22: 500 [IU]
  Filled 2016-04-22: qty 5

## 2016-04-22 MED ORDER — SODIUM CHLORIDE 0.9 % IV SOLN
10.0000 mg | Freq: Once | INTRAVENOUS | Status: DC
  Filled 2016-04-22: qty 1

## 2016-04-22 MED ORDER — SODIUM CHLORIDE 0.9 % IV SOLN
Freq: Once | INTRAVENOUS | Status: AC
  Administered 2016-04-22: 13:00:00 via INTRAVENOUS

## 2016-04-22 NOTE — Progress Notes (Signed)
Nutrition Follow-up:  Nutrition follow-up completed during infusion this pm.  Patient with lung cancer and receiving chemotherapy and radiation therapy here at [facility].  Patient is followed by Dr. Marin Olp in [facility].    Noted on 3/16 IR was unable to place feeding tube and recommend that surgeon place G tube.  Family member at bedside reported that patient has an appointment with Dr. Marin Olp on Monday 3/26 to discuss plans for feeding tube.  Patient unsure at this time if or when he will get the tube placed.    Patient reports that he is still eating puree foods and thickened liquids.  Reports that he is drinking 5 ensure/boost plus per day.      Medications: reviewed  Labs: reviewed  Anthropometrics:   No new weight taken today.  Last measured weight on 3/16 of 189 lb 12.8 oz.     Estimated Energy Needs  Kcals: 2150-2577 calories  Protein: 112-118 g/d Fluid: 2.2 L fluid  NUTRITION DIAGNOSIS:  Unintended weight loss stable    INTERVENTION:   Encouraged patient to continue to drink 5 bottles of supplements with at least 350 calories or more per day Encouraged intake of puree foods.   Will hold off on any further tube feeding education at this time as patient unsure next steps.  Questions answered    MONITORING, EVALUATION, GOAL: patient will tolerate adequate calories and protein to minimize loss of lean body mass.  Will add tube feeding once patient is unable to consume adequate calories and protein   NEXT VISIT: April 17 th  Lani Mendiola B. Zenia Resides, [place], [place] Registered Dietitian [phone] (pager)

## 2016-04-22 NOTE — Patient Instructions (Signed)
[place] Discharge Instructions for Patients Receiving Chemotherapy  Today you received the following chemotherapy agents:  Etoposide (vepesid, VP 16)  To help prevent nausea and vomiting after your treatment, we encourage you to take your nausea medication as prescribed.   If you develop nausea and vomiting that is not controlled by your nausea medication, call the clinic.   BELOW ARE SYMPTOMS THAT SHOULD BE REPORTED IMMEDIATELY:  *FEVER GREATER THAN 100.5 F  *CHILLS WITH OR WITHOUT FEVER  NAUSEA AND VOMITING THAT IS NOT CONTROLLED WITH YOUR NAUSEA MEDICATION  *UNUSUAL SHORTNESS OF BREATH  *UNUSUAL BRUISING OR BLEEDING  TENDERNESS IN MOUTH AND THROAT WITH OR WITHOUT PRESENCE OF ULCERS  *URINARY PROBLEMS  *BOWEL PROBLEMS  UNUSUAL RASH Items with * indicate a potential emergency and should be followed up as soon as possible.  Feel free to call the clinic you have any questions or concerns. The clinic phone number is [phone].  Please show the [place] at check-in to the Emergency Department and triage nurse.

## 2016-04-23 ENCOUNTER — Ambulatory Visit (HOSPITAL_BASED_OUTPATIENT_CLINIC_OR_DEPARTMENT_OTHER): Payer: Medicare Other

## 2016-04-23 ENCOUNTER — Telehealth: Payer: Self-pay | Admitting: *Deleted

## 2016-04-23 VITALS — BP 116/62 | HR 67 | Temp 98.2°F | Resp 17

## 2016-04-23 DIAGNOSIS — C3401 Malignant neoplasm of right main bronchus: Secondary | ICD-10-CM | POA: Diagnosis not present

## 2016-04-23 DIAGNOSIS — C3491 Malignant neoplasm of unspecified part of right bronchus or lung: Secondary | ICD-10-CM | POA: Diagnosis not present

## 2016-04-23 DIAGNOSIS — Z5111 Encounter for antineoplastic chemotherapy: Secondary | ICD-10-CM | POA: Diagnosis not present

## 2016-04-23 DIAGNOSIS — Z9981 Dependence on supplemental oxygen: Secondary | ICD-10-CM | POA: Diagnosis not present

## 2016-04-23 DIAGNOSIS — Z510 Encounter for antineoplastic radiation therapy: Secondary | ICD-10-CM | POA: Diagnosis not present

## 2016-04-23 DIAGNOSIS — Z79899 Other long term (current) drug therapy: Secondary | ICD-10-CM | POA: Diagnosis not present

## 2016-04-23 DIAGNOSIS — Z7982 Long term (current) use of aspirin: Secondary | ICD-10-CM | POA: Diagnosis not present

## 2016-04-23 DIAGNOSIS — R1310 Dysphagia, unspecified: Secondary | ICD-10-CM | POA: Diagnosis not present

## 2016-04-23 MED ORDER — SODIUM CHLORIDE 0.9 % IV SOLN
Freq: Once | INTRAVENOUS | Status: AC
  Administered 2016-04-23: 15:00:00 via INTRAVENOUS

## 2016-04-23 MED ORDER — DEXAMETHASONE SODIUM PHOSPHATE 10 MG/ML IJ SOLN
10.0000 mg | Freq: Once | INTRAMUSCULAR | Status: AC
  Administered 2016-04-23: 10 mg via INTRAVENOUS

## 2016-04-23 MED ORDER — HEPARIN SOD (PORK) LOCK FLUSH 100 UNIT/ML IV SOLN
500.0000 [IU] | Freq: Once | INTRAVENOUS | Status: AC | PRN
  Administered 2016-04-23: 500 [IU]
  Filled 2016-04-23: qty 5

## 2016-04-23 MED ORDER — SODIUM CHLORIDE 0.9% FLUSH
10.0000 mL | INTRAVENOUS | Status: DC | PRN
  Administered 2016-04-23: 10 mL
  Filled 2016-04-23: qty 10

## 2016-04-23 MED ORDER — DEXAMETHASONE 4 MG PO TABS: ORAL_TABLET | ORAL | 1 refills | Status: DC

## 2016-04-23 MED ORDER — SODIUM CHLORIDE 0.9 % IV SOLN
40.0000 mg/m2 | Freq: Once | INTRAVENOUS | Status: AC
  Administered 2016-04-23: 80 mg via INTRAVENOUS
  Filled 2016-04-23: qty 4

## 2016-04-23 MED ORDER — DEXAMETHASONE SODIUM PHOSPHATE 10 MG/ML IJ SOLN
INTRAMUSCULAR | Status: AC
  Filled 2016-04-23: qty 1

## 2016-04-23 NOTE — Telephone Encounter (Signed)
Attempted to call patient a few times without response.   Called and spoke to his daughter, [name]. She states he is doing well. No immediate complaints or side effects. He will be getting day three today. He has all prescribed prn meds at home.   Clarified scheduling of antiemetics. She had a question about the decadron dosing requiring clarification with Dr Marin Olp. [name] aware of updated instructions and order in chart revised. She knows to call the office with any further questions or side effect development.  She also asked about the failed attempts with the feeding tube. Per Dr Marin Olp we are holding on that right now until he can be assessed during his Monday appointment here in the office.

## 2016-04-23 NOTE — Patient Instructions (Signed)
[facility] Discharge Instructions for Patients Receiving Chemotherapy  Today you received the following chemotherapy agents: Etoposide   To help prevent nausea and vomiting after your treatment, we encourage you to take your nausea medication as directed.    If you develop nausea and vomiting that is not controlled by your nausea medication, call the clinic.   BELOW ARE SYMPTOMS THAT SHOULD BE REPORTED IMMEDIATELY:  *FEVER GREATER THAN 100.5 F  *CHILLS WITH OR WITHOUT FEVER  NAUSEA AND VOMITING THAT IS NOT CONTROLLED WITH YOUR NAUSEA MEDICATION  *UNUSUAL SHORTNESS OF BREATH  *UNUSUAL BRUISING OR BLEEDING  TENDERNESS IN MOUTH AND THROAT WITH OR WITHOUT PRESENCE OF ULCERS  *URINARY PROBLEMS  *BOWEL PROBLEMS  UNUSUAL RASH Items with * indicate a potential emergency and should be followed up as soon as possible.  Feel free to call the clinic you have any questions or concerns. The clinic phone number is [phone].  Please show the CHEMO ALERT CARD at check-in to the Emergency Department and triage nurse.   

## 2016-04-23 NOTE — Progress Notes (Signed)
  Radiation Oncology         [phone] ________________________________  Name: PHILMORE LEPORE MRN: [id number]  Date: 04/22/2016  DOB: February 24, 1940  SIMULATION AND TREATMENT PLANNING NOTE    ICD-9-CM ICD-10-CM   1. 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  Identity was confirmed.  All relevant records and images related to the planned course of therapy were reviewed.  The patient freely provided informed written consent to proceed with treatment after reviewing the details related to the planned course of therapy. The consent form was witnessed and verified by the simulation staff.  Then, the patient was set-up in a stable reproducible  supine position for radiation therapy.  CT images were obtained.  Surface markings were placed.  The CT images were loaded into the planning software.  Then the target and avoidance structures were contoured.  Treatment planning then occurred.  The radiation prescription was entered and confirmed.  Then, I designed and supervised the construction of a total of 6 medically necessary complex treatment devices.  I have requested : 3D Simulation  I have requested a DVH of the following structures: PTV, GTV, heart, lungs, esophagus, spinal cord.  I have ordered:dose calc.  PLAN:  The patient will receive 60 Gy in 30 fractions.  Special Treatment Procedure Note: The patient will be receiving radiosensitizing chemotherapy. Given the potential of increased toxicities related to combined therapy and the necessity for close monitoring of the patient and blood work, this constitutes a special treatment procedure.  -----------------------------------  Blair Promise, PhD, MD

## 2016-04-24 ENCOUNTER — Ambulatory Visit
Admission: RE | Admit: 2016-04-24 | Discharge: 2016-04-24 | Disposition: A | Discharge status: Home / Self Care | Payer: Medicare Other | Source: Ambulatory Visit | Attending: Radiation Oncology | Admitting: Radiation Oncology

## 2016-04-24 ENCOUNTER — Ambulatory Visit: Payer: Medicare Other

## 2016-04-24 DIAGNOSIS — Z510 Encounter for antineoplastic radiation therapy: Secondary | ICD-10-CM | POA: Diagnosis not present

## 2016-04-24 DIAGNOSIS — Z79899 Other long term (current) drug therapy: Secondary | ICD-10-CM | POA: Diagnosis not present

## 2016-04-24 DIAGNOSIS — C3401 Malignant neoplasm of right main bronchus: Secondary | ICD-10-CM | POA: Diagnosis not present

## 2016-04-24 DIAGNOSIS — Z7982 Long term (current) use of aspirin: Secondary | ICD-10-CM | POA: Diagnosis not present

## 2016-04-24 DIAGNOSIS — R1310 Dysphagia, unspecified: Secondary | ICD-10-CM | POA: Diagnosis not present

## 2016-04-24 DIAGNOSIS — Z9981 Dependence on supplemental oxygen: Secondary | ICD-10-CM | POA: Diagnosis not present

## 2016-04-24 DIAGNOSIS — C3491 Malignant neoplasm of unspecified part of right bronchus or lung: Secondary | ICD-10-CM | POA: Diagnosis not present

## 2016-04-24 NOTE — Progress Notes (Signed)
  Radiation Oncology         [phone] ________________________________  Name: Bradley Meadows MRN: [id number]  Date: 04/24/2016  DOB: 06-28-1940  Simulation Verification Note    ICD-9-CM ICD-10-CM   1. 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 The clinical setup was verified. Then port films were obtained and uploaded to the radiation oncology medical record software.  The treatment beams were carefully compared against the planned radiation fields. The position location and shape of the radiation fields was reviewed. They targeted volume of tissue appears to be appropriately covered by the radiation beams. Organs at risk appear to be excluded as planned.  Based on my personal review, I approved the simulation verification. The patient's treatment will proceed as planned.  -----------------------------------  Blair Promise, PhD, MD

## 2016-04-25 ENCOUNTER — Other Ambulatory Visit (HOSPITAL_BASED_OUTPATIENT_CLINIC_OR_DEPARTMENT_OTHER): Payer: Medicare Other

## 2016-04-25 ENCOUNTER — Ambulatory Visit: Payer: Medicare Other

## 2016-04-25 ENCOUNTER — Ambulatory Visit
Admission: RE | Admit: 2016-04-25 | Discharge: 2016-04-25 | Disposition: A | Discharge status: Home / Self Care | Payer: Medicare Other | Source: Ambulatory Visit | Attending: Radiation Oncology | Admitting: Radiation Oncology

## 2016-04-25 ENCOUNTER — Other Ambulatory Visit: Payer: Medicare Other

## 2016-04-25 DIAGNOSIS — Z79899 Other long term (current) drug therapy: Secondary | ICD-10-CM | POA: Diagnosis not present

## 2016-04-25 DIAGNOSIS — C3491 Malignant neoplasm of unspecified part of right bronchus or lung: Secondary | ICD-10-CM | POA: Diagnosis present

## 2016-04-25 DIAGNOSIS — C3401 Malignant neoplasm of right main bronchus: Secondary | ICD-10-CM | POA: Diagnosis not present

## 2016-04-25 DIAGNOSIS — Z7982 Long term (current) use of aspirin: Secondary | ICD-10-CM | POA: Diagnosis not present

## 2016-04-25 DIAGNOSIS — Z9981 Dependence on supplemental oxygen: Secondary | ICD-10-CM | POA: Diagnosis not present

## 2016-04-25 DIAGNOSIS — C3400 Malignant neoplasm of unspecified main bronchus: Secondary | ICD-10-CM

## 2016-04-25 DIAGNOSIS — Z510 Encounter for antineoplastic radiation therapy: Secondary | ICD-10-CM | POA: Diagnosis not present

## 2016-04-25 DIAGNOSIS — R1310 Dysphagia, unspecified: Secondary | ICD-10-CM | POA: Diagnosis not present

## 2016-04-25 DIAGNOSIS — Z7189 Other specified counseling: Secondary | ICD-10-CM

## 2016-04-25 LAB — CBC WITH DIFFERENTIAL/PLATELET
BASO%: 0 % (ref 0.0–2.0)
BASOS ABS: 0 10*3/uL (ref 0.0–0.1)
EOS ABS: 0 10*3/uL (ref 0.0–0.5)
EOS%: 0 % (ref 0.0–7.0)
HCT: 35.4 % — ABNORMAL LOW (ref 38.4–49.9)
HGB: 12.2 g/dL — ABNORMAL LOW (ref 13.0–17.1)
LYMPH%: 3.6 % — AB (ref 14.0–49.0)
MCH: 31.9 pg (ref 27.2–33.4)
MCHC: 34.5 g/dL (ref 32.0–36.0)
MCV: 92.4 fL (ref 79.3–98.0)
MONO#: 0.1 10*3/uL (ref 0.1–0.9)
MONO%: 1.6 % (ref 0.0–14.0)
NEUT#: 6.4 10*3/uL (ref 1.5–6.5)
NEUT%: 94.8 % — AB (ref 39.0–75.0)
PLATELETS: 156 10*3/uL (ref 140–400)
RBC: 3.83 10*6/uL — AB (ref 4.20–5.82)
RDW: 13.7 % (ref 11.0–14.6)
WBC: 6.7 10*3/uL (ref 4.0–10.3)
lymph#: 0.2 10*3/uL — ABNORMAL LOW (ref 0.9–3.3)

## 2016-04-25 LAB — COMPREHENSIVE METABOLIC PANEL
ALT: 17 U/L (ref 0–55)
ANION GAP: 7 meq/L (ref 3–11)
AST: 14 U/L (ref 5–34)
Albumin: 3.5 g/dL (ref 3.5–5.0)
Alkaline Phosphatase: 51 U/L (ref 40–150)
BILIRUBIN TOTAL: 0.65 mg/dL (ref 0.20–1.20)
BUN: 18.6 mg/dL (ref 7.0–26.0)
CO2: 26 meq/L (ref 22–29)
Calcium: 8.7 mg/dL (ref 8.4–10.4)
Chloride: 94 mEq/L — ABNORMAL LOW (ref 98–109)
Creatinine: 0.7 mg/dL (ref 0.7–1.3)
Glucose: 114 mg/dl (ref 70–140)
Potassium: 4.5 mEq/L (ref 3.5–5.1)
Sodium: 128 mEq/L — ABNORMAL LOW (ref 136–145)
TOTAL PROTEIN: 5.5 g/dL — AB (ref 6.4–8.3)

## 2016-04-25 LAB — LACTATE DEHYDROGENASE: LDH: 130 U/L (ref 125–245)

## 2016-04-28 ENCOUNTER — Ambulatory Visit: Payer: Medicare Other | Admitting: Family

## 2016-04-28 ENCOUNTER — Other Ambulatory Visit: Payer: Medicare Other

## 2016-04-28 ENCOUNTER — Ambulatory Visit (HOSPITAL_BASED_OUTPATIENT_CLINIC_OR_DEPARTMENT_OTHER): Payer: Medicare Other | Admitting: Hematology & Oncology

## 2016-04-28 ENCOUNTER — Ambulatory Visit: Payer: Medicare Other

## 2016-04-28 ENCOUNTER — Ambulatory Visit
Admission: RE | Admit: 2016-04-28 | Discharge: 2016-04-28 | Disposition: A | Discharge status: Home / Self Care | Payer: Medicare Other | Source: Ambulatory Visit | Attending: Radiation Oncology | Admitting: Radiation Oncology

## 2016-04-28 VITALS — BP 104/57 | HR 71 | Temp 98.0°F | Resp 16 | Wt 170.5 lb

## 2016-04-28 DIAGNOSIS — C3401 Malignant neoplasm of right main bronchus: Secondary | ICD-10-CM | POA: Diagnosis not present

## 2016-04-28 DIAGNOSIS — B370 Candidal stomatitis: Secondary | ICD-10-CM

## 2016-04-28 DIAGNOSIS — R062 Wheezing: Secondary | ICD-10-CM

## 2016-04-28 DIAGNOSIS — Z7982 Long term (current) use of aspirin: Secondary | ICD-10-CM | POA: Diagnosis not present

## 2016-04-28 DIAGNOSIS — R634 Abnormal weight loss: Secondary | ICD-10-CM

## 2016-04-28 DIAGNOSIS — R1310 Dysphagia, unspecified: Secondary | ICD-10-CM | POA: Diagnosis not present

## 2016-04-28 DIAGNOSIS — Z79899 Other long term (current) drug therapy: Secondary | ICD-10-CM | POA: Diagnosis not present

## 2016-04-28 DIAGNOSIS — Z510 Encounter for antineoplastic radiation therapy: Secondary | ICD-10-CM | POA: Diagnosis not present

## 2016-04-28 DIAGNOSIS — C3491 Malignant neoplasm of unspecified part of right bronchus or lung: Secondary | ICD-10-CM

## 2016-04-28 DIAGNOSIS — R222 Localized swelling, mass and lump, trunk: Secondary | ICD-10-CM | POA: Diagnosis not present

## 2016-04-28 DIAGNOSIS — Z9981 Dependence on supplemental oxygen: Secondary | ICD-10-CM | POA: Diagnosis not present

## 2016-04-28 MED ORDER — FLUCONAZOLE 40 MG/ML PO SUSR: 200.0000 mg | Freq: Every day | ORAL | 3 refills | Status: DC

## 2016-04-28 NOTE — Progress Notes (Signed)
Hematology and Oncology Follow Up Visit  Bradley Meadows [id number] 22-Aug-1940 76 y.o. 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 He has started treatment. His main problem has been a large mediastinal mass of lymph nodes. This is compressing his esophagus. It was making it difficult for him to swallow.  Because he has a squamous cell cancer, I thought it would be reasonable to use cis-platinum/VP-16 along with radiation. I think both of these are good way to try to shrink this mass.  He possibly may have stage IV disease. On his PET scan, there was some adenopathy in the gastrohepatic ligament area.  He still is lost weight. I am try to hold off on a feeding tube because this will need to be done surgically. This was tried by radiology and they cannot put this in safely.  I told him that he has to drink boost for 5 times a day. Also recommended milkshakes with protein powder 3 times a day.  I think that his wake is below 160 pounds, we will have to get a feeding tube put into him.  He has some wheezing. He has some odynophagia.  He's had no fever.  He's had no diarrhea. He's had no urinary issues.  Overall, I was saying his performance status is probably ECOG 1.  Medications:  Current Outpatient Prescriptions:  .  ALPRAZolam (XANAX) 0.5 MG tablet, Take one tablet 30 minutes prior to scan., Disp: 1 tablet, Rfl: 0 .  ALPRAZolam (XANAX) 0.5 MG tablet, Take 1 tablet (0.5 mg total) by mouth 3 (three) times daily as needed for anxiety (or prior to scan)., Disp: 15 tablet, Rfl: 0 .  aspirin 81 MG tablet, Take 1 tablet (81 mg total) by mouth daily., Disp: 1 tablet, Rfl: 3 .  atorvastatin (LIPITOR) 40 MG tablet, TAKE 1 TABLET(40 MG) BY MOUTH DAILY, Disp: 90 tablet, Rfl: 1 .  dexamethasone (DECADRON) 4 MG  tablet, Take 2 tabs daily for three days after chemotherapy. Take with food., Disp: 30 tablet, Rfl: 1 .  HYDROcodone-acetaminophen (NORCO/VICODIN) 5-325 MG tablet, Take 1 tablet by mouth 2 (two) times daily as needed for moderate pain., Disp: , Rfl:  .  Ipratropium-Albuterol (COMBIVENT RESPIMAT) 20-100 MCG/ACT AERS respimat, Inhale 2 puffs into the lungs every 6 (six) hours as needed for wheezing., Disp: , Rfl:  .  ipratropium-albuterol (DUONEB) 0.5-2.5 (3) MG/3ML SOLN, USE 1 VIAL VIA NEBULIZER EVERY 6 HOURS AS NEEDED FOR SHORTNESS OF BREATH OR WHEEZING, Disp: 990 mL, Rfl: 0 .  lidocaine-prilocaine (EMLA) cream, Apply to port 1-2 hours before needle stick. Cover with plastic wrap., Disp: 30 g, Rfl: 0 .  metoprolol tartrate (LOPRESSOR) 25 MG tablet, Take 1 tablet (25 mg total) by mouth 2 (two) times daily., Disp: , Rfl:  .  Multiple Vitamin (MULTIVITAMIN) tablet, Take 1 tablet by mouth daily. Centrum Silver, Disp: , Rfl:  .  ondansetron (ZOFRAN) 8 MG tablet, Take 1 tablet (8 mg total) by mouth 2 (two) times daily as needed. Start on the third day after cisplatin chemotherapy., Disp: 30 tablet, Rfl: 1 .  prochlorperazine (COMPAZINE) 10 MG tablet, TAKE 1 TABLET(10 MG) BY MOUTH EVERY 6 HOURS AS NEEDED FOR NAUSEA OR VOMITING, Disp: 385 tablet, Rfl: 1 .  sucralfate (CARAFATE) 1 GM/10ML suspension, Take 10  mLs (1 g total) by mouth 4 (four) times daily -  with meals and at bedtime., Disp: 840 mL, Rfl: 3 .  Tiotropium Bromide-Olodaterol (STIOLTO RESPIMAT) 2.5-2.5 MCG/ACT AERS, Inhale 2 puffs into the lungs daily., Disp: 1 Inhaler, Rfl: 11 .  fluconazole (DIFLUCAN) 40 MG/ML suspension, Take 5 mLs (200 mg total) by mouth daily., Disp: 140 mL, Rfl: 3  Allergies:  Allergies  Allergen Reactions  . No Known Allergies     Past Medical History, Surgical history, Social history, and Family History were reviewed and updated.  Review of Systems: As above  Physical Exam:  weight is 170 lb 8 oz (77.3 kg). His  oral temperature is 98 F (36.7 C). His blood pressure is 104/57 (abnormal) and his pulse is 71. His respiration is 16 and oxygen saturation is 98%.   Wt Readings from Last 3 Encounters:  04/28/16 170 lb 8 oz (77.3 kg)  04/18/16 189 lb 12.8 oz (86.1 kg)  04/17/16 189 lb 12.8 oz (86.1 kg)     Well-developed and well-nourished white male. Head and neck exam shows obvious pharyngeal thrush. He has no adenopathy in the neck. There is no scleral icterus. Lungs are clear bilaterally. Cardiac exam regular rate and rhythm with a normal S1 and S2. There are no murmurs, rubs or bruits. Abdomen is soft area and he has good bowel sounds. There is no fluid wave. There is no palpable liver or spleen tip. Extremities shows no clubbing, cyanosis or edema. Skin exam shows no rashes, ecchymoses or petechia. Neurological exam shows no focal neurological deficits.     Lab Results  Component Value Date   WBC 6.7 04/25/2016   HGB 12.2 (L) 04/25/2016   HCT 35.4 (L) 04/25/2016   MCV 92.4 04/25/2016   PLT 156 04/25/2016     Chemistry      Component Value Date/Time   NA 128 (L) 04/25/2016 1555   K 4.5 04/25/2016 1555   CL 89 (L) 04/07/2016 1310   CO2 26 04/25/2016 1555   BUN 18.6 04/25/2016 1555   CREATININE 0.7 04/25/2016 1555      Component Value Date/Time   CALCIUM 8.7 04/25/2016 1555   ALKPHOS 51 04/25/2016 1555   AST 14 04/25/2016 1555   ALT 17 04/25/2016 1555   BILITOT 0.65 04/25/2016 1555         Impression and Plan: Bradley Meadows is A 76 year old white male. 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 This is not operable. 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  Our goal is to get him to swallow better and eat better. He has a large mediastinal component that is pressing on his esophagus.  The thrush that he has definitely is causing problems. I will go ahead and put him on some oral Diflucan. I'll give him  a liquid.  I will like to see him back in about 3 weeks. At that point, he will be due for his second cycle of chemotherapy.  Again, the weight is going to be key. This will really dictate his prognosis and obviously whether or not we need to put in a feeding tube.  I spent about 30 minutes with he and his daughter.   Volanda Napoleon, MD 3/26/20184:52 PM

## 2016-04-29 ENCOUNTER — Ambulatory Visit
Admission: RE | Admit: 2016-04-29 | Discharge: 2016-04-29 | Disposition: A | Discharge status: Home / Self Care | Payer: Medicare Other | Source: Ambulatory Visit | Attending: Radiation Oncology | Admitting: Radiation Oncology

## 2016-04-29 ENCOUNTER — Encounter: Payer: Self-pay | Admitting: Radiation Oncology

## 2016-04-29 VITALS — BP 120/73 | HR 74 | Temp 98.2°F | Ht 70.0 in | Wt 184.0 lb

## 2016-04-29 DIAGNOSIS — R1310 Dysphagia, unspecified: Secondary | ICD-10-CM | POA: Diagnosis not present

## 2016-04-29 DIAGNOSIS — Z79899 Other long term (current) drug therapy: Secondary | ICD-10-CM | POA: Diagnosis not present

## 2016-04-29 DIAGNOSIS — Z510 Encounter for antineoplastic radiation therapy: Secondary | ICD-10-CM | POA: Diagnosis not present

## 2016-04-29 DIAGNOSIS — Z9981 Dependence on supplemental oxygen: Secondary | ICD-10-CM | POA: Diagnosis not present

## 2016-04-29 DIAGNOSIS — Z7982 Long term (current) use of aspirin: Secondary | ICD-10-CM | POA: Diagnosis not present

## 2016-04-29 DIAGNOSIS — C3491 Malignant neoplasm of unspecified part of right bronchus or lung: Secondary | ICD-10-CM

## 2016-04-29 DIAGNOSIS — C3401 Malignant neoplasm of right main bronchus: Secondary | ICD-10-CM | POA: Diagnosis not present

## 2016-04-29 NOTE — Progress Notes (Signed)
Mr. Sherrow is here for his 4th fraction of radiation to his CHest. He denies pain. He does have chronic fatigue and is not sleeping well. He has a cough that is non-productive. He has no other concerns at this time.   BP 120/73   Pulse 74   Temp 98.2 F (36.8 C)   Ht '5\' 10"'$  (1.778 m)   Wt 184 lb (83.5 kg)   SpO2 100% Comment: room air  BMI 26.40 kg/m    Wt Readings from Last 3 Encounters:  04/29/16 184 lb (83.5 kg)  04/28/16 170 lb 8 oz (77.3 kg)  04/18/16 189 lb 12.8 oz (86.1 kg)

## 2016-04-29 NOTE — Progress Notes (Signed)
  Radiation Oncology         [phone] ________________________________  Name: Bradley Meadows MRN: [id number]  Date: 04/29/2016  DOB: [birth year]  Weekly Radiation Therapy Management    ICD-9-CM ICD-10-CM   1. Squamous cell lung cancer, right (HCC) 162.9 C34.91      Current Dose: 8 Gy     Planned Dose:  60 Gy  Narrative . . . . . . . . The patient presents for routine under treatment assessment.                        Bradley Meadows is here for his 4th fraction of radiation to his CHest. He denies pain. He does have chronic fatigue and is not sleeping well. He has a cough that is non-productive. He has no other concerns at this time.            Interventional radiology has been unable to place a feeding tube. The patient is able to take liquids and soft foods such as oatmeal. He reports having 3 treatments with chemotherapy.                                  Set-up films were reviewed.                                 The chart was checked. Physical Findings. . .  height is '5\' 10"'$  (1.778 m) and weight is 184 lb (83.5 kg). His temperature is 98.2 F (36.8 C). His blood pressure is 120/73 and his pulse is 74. His oxygen saturation is 100%. . The lungs are clear. The heart has a regular rhythm and rate. Patient continues to call with a hoarse voice Impression . . . . . . . The patient is tolerating radiation. Plan . . . . . . . . . . . . Continue treatment as planned.  ________________________________   Blair Promise, PhD, MD  This document serves as a record of services personally performed by Gery Pray, MD. It was created on his behalf by Darcus Austin, a trained medical scribe. The creation of this record is based on the scribe's personal observations and the provider's statements to them. This document has been checked and approved by the attending provider.

## 2016-04-30 ENCOUNTER — Ambulatory Visit
Admission: RE | Admit: 2016-04-30 | Discharge: 2016-04-30 | Disposition: A | Discharge status: Home / Self Care | Payer: Medicare Other | Source: Ambulatory Visit | Attending: Radiation Oncology | Admitting: Radiation Oncology

## 2016-04-30 DIAGNOSIS — Z79899 Other long term (current) drug therapy: Secondary | ICD-10-CM | POA: Diagnosis not present

## 2016-04-30 DIAGNOSIS — C3491 Malignant neoplasm of unspecified part of right bronchus or lung: Secondary | ICD-10-CM | POA: Diagnosis not present

## 2016-04-30 DIAGNOSIS — Z510 Encounter for antineoplastic radiation therapy: Secondary | ICD-10-CM | POA: Diagnosis not present

## 2016-04-30 DIAGNOSIS — C3401 Malignant neoplasm of right main bronchus: Secondary | ICD-10-CM | POA: Diagnosis not present

## 2016-04-30 DIAGNOSIS — Z7982 Long term (current) use of aspirin: Secondary | ICD-10-CM | POA: Diagnosis not present

## 2016-04-30 DIAGNOSIS — Z9981 Dependence on supplemental oxygen: Secondary | ICD-10-CM | POA: Diagnosis not present

## 2016-04-30 DIAGNOSIS — R1310 Dysphagia, unspecified: Secondary | ICD-10-CM | POA: Diagnosis not present

## 2016-04-30 MED ORDER — BIAFINE EX EMUL
Freq: Once | CUTANEOUS | Status: AC
  Administered 2016-04-30: 18:00:00 via TOPICAL

## 2016-04-30 NOTE — Progress Notes (Signed)
Pt here for patient teaching.  Pt given Radiation and You booklet and biafine.  Reviewed areas of pertinence such as fatigue, skin changes and throat changes . Pt able to give teach back of apply biafine BID, to pat skin,avoid applying anything to skin within 4 hours of treatment. Pt demonstrated understanding and verbalizes understanding of information given and will contact nursing with any questions or concerns.

## 2016-05-01 ENCOUNTER — Ambulatory Visit
Admission: RE | Admit: 2016-05-01 | Discharge: 2016-05-01 | Disposition: A | Discharge status: Home / Self Care | Payer: Medicare Other | Source: Ambulatory Visit | Attending: Radiation Oncology | Admitting: Radiation Oncology

## 2016-05-01 DIAGNOSIS — C3401 Malignant neoplasm of right main bronchus: Secondary | ICD-10-CM | POA: Diagnosis not present

## 2016-05-01 DIAGNOSIS — C3491 Malignant neoplasm of unspecified part of right bronchus or lung: Secondary | ICD-10-CM | POA: Diagnosis not present

## 2016-05-01 DIAGNOSIS — Z510 Encounter for antineoplastic radiation therapy: Secondary | ICD-10-CM | POA: Diagnosis not present

## 2016-05-01 DIAGNOSIS — R1310 Dysphagia, unspecified: Secondary | ICD-10-CM | POA: Diagnosis not present

## 2016-05-01 DIAGNOSIS — Z9981 Dependence on supplemental oxygen: Secondary | ICD-10-CM | POA: Diagnosis not present

## 2016-05-01 DIAGNOSIS — Z79899 Other long term (current) drug therapy: Secondary | ICD-10-CM | POA: Diagnosis not present

## 2016-05-01 DIAGNOSIS — Z7982 Long term (current) use of aspirin: Secondary | ICD-10-CM | POA: Diagnosis not present

## 2016-05-02 ENCOUNTER — Ambulatory Visit
Admission: RE | Admit: 2016-05-02 | Discharge: 2016-05-02 | Disposition: A | Discharge status: Home / Self Care | Payer: Medicare Other | Source: Ambulatory Visit | Attending: Radiation Oncology | Admitting: Radiation Oncology

## 2016-05-02 DIAGNOSIS — C3491 Malignant neoplasm of unspecified part of right bronchus or lung: Secondary | ICD-10-CM | POA: Diagnosis not present

## 2016-05-02 DIAGNOSIS — Z510 Encounter for antineoplastic radiation therapy: Secondary | ICD-10-CM | POA: Diagnosis not present

## 2016-05-02 DIAGNOSIS — R1310 Dysphagia, unspecified: Secondary | ICD-10-CM | POA: Diagnosis not present

## 2016-05-02 DIAGNOSIS — Z7982 Long term (current) use of aspirin: Secondary | ICD-10-CM | POA: Diagnosis not present

## 2016-05-02 DIAGNOSIS — Z79899 Other long term (current) drug therapy: Secondary | ICD-10-CM | POA: Diagnosis not present

## 2016-05-02 DIAGNOSIS — C3401 Malignant neoplasm of right main bronchus: Secondary | ICD-10-CM | POA: Diagnosis not present

## 2016-05-02 DIAGNOSIS — Z9981 Dependence on supplemental oxygen: Secondary | ICD-10-CM | POA: Diagnosis not present

## 2016-05-05 ENCOUNTER — Ambulatory Visit
Admission: RE | Admit: 2016-05-05 | Discharge: 2016-05-05 | Disposition: A | Discharge status: Home / Self Care | Payer: Medicare Other | Source: Ambulatory Visit | Attending: Radiation Oncology | Admitting: Radiation Oncology

## 2016-05-05 DIAGNOSIS — Z79899 Other long term (current) drug therapy: Secondary | ICD-10-CM | POA: Diagnosis not present

## 2016-05-05 DIAGNOSIS — R1310 Dysphagia, unspecified: Secondary | ICD-10-CM | POA: Diagnosis not present

## 2016-05-05 DIAGNOSIS — C3401 Malignant neoplasm of right main bronchus: Secondary | ICD-10-CM | POA: Diagnosis not present

## 2016-05-05 DIAGNOSIS — Z510 Encounter for antineoplastic radiation therapy: Secondary | ICD-10-CM | POA: Diagnosis not present

## 2016-05-05 DIAGNOSIS — Z7982 Long term (current) use of aspirin: Secondary | ICD-10-CM | POA: Diagnosis not present

## 2016-05-05 DIAGNOSIS — C3491 Malignant neoplasm of unspecified part of right bronchus or lung: Secondary | ICD-10-CM | POA: Diagnosis not present

## 2016-05-05 DIAGNOSIS — Z9981 Dependence on supplemental oxygen: Secondary | ICD-10-CM | POA: Diagnosis not present

## 2016-05-06 ENCOUNTER — Encounter: Payer: Self-pay | Admitting: Radiation Oncology

## 2016-05-06 ENCOUNTER — Ambulatory Visit
Admission: RE | Admit: 2016-05-06 | Discharge: 2016-05-06 | Disposition: A | Discharge status: Home / Self Care | Payer: Medicare Other | Source: Ambulatory Visit | Attending: Radiation Oncology | Admitting: Radiation Oncology

## 2016-05-06 VITALS — BP 122/59 | HR 62 | Temp 97.7°F | Resp 18 | Ht 70.0 in | Wt 181.4 lb

## 2016-05-06 DIAGNOSIS — C3491 Malignant neoplasm of unspecified part of right bronchus or lung: Secondary | ICD-10-CM

## 2016-05-06 DIAGNOSIS — Z7982 Long term (current) use of aspirin: Secondary | ICD-10-CM | POA: Diagnosis not present

## 2016-05-06 DIAGNOSIS — Z9981 Dependence on supplemental oxygen: Secondary | ICD-10-CM | POA: Diagnosis not present

## 2016-05-06 DIAGNOSIS — Z79899 Other long term (current) drug therapy: Secondary | ICD-10-CM | POA: Diagnosis not present

## 2016-05-06 DIAGNOSIS — C3401 Malignant neoplasm of right main bronchus: Secondary | ICD-10-CM | POA: Diagnosis not present

## 2016-05-06 DIAGNOSIS — Z510 Encounter for antineoplastic radiation therapy: Secondary | ICD-10-CM | POA: Diagnosis not present

## 2016-05-06 DIAGNOSIS — R1310 Dysphagia, unspecified: Secondary | ICD-10-CM | POA: Diagnosis not present

## 2016-05-06 NOTE — Progress Notes (Signed)
Mr. Kassebaum is here for his 9th fraction of radiation to his CHest. He denies pain. He does have chronic fatigue and is not sleeping well. He has a cough that is brownish,wheezing at times using O 2 3  l/m  at home. He has no other concerns at this time.  Drinking Ensure 4-5 times a day Wt Readings from Last 3 Encounters:  05/06/16 181 lb 6.4 oz (82.3 kg)  04/29/16 184 lb (83.5 kg)  04/28/16 170 lb 8 oz (77.3 kg)  BP (!) 122/59   Pulse 62   Temp 97.7 F (36.5 C) (Oral)   Resp 18   Ht '5\' 10"'$  (1.778 m)   Wt 181 lb 6.4 oz (82.3 kg)   SpO2 100%   BMI 26.03 kg/m

## 2016-05-06 NOTE — Progress Notes (Signed)
  Radiation Oncology         [phone] ________________________________  Name: Bradley Meadows MRN: [id number]  Date: 05/06/2016  DOB: [birth year]  Weekly Radiation Therapy Management    ICD-9-CM ICD-10-CM   1. Squamous cell lung cancer, right (HCC) 162.9 C34.91      Current Dose: 18 Gy     Planned Dose:  60 Gy  Narrative . . . . . . . . The patient presents for routine under treatment assessment.                        Mr. Blann is here for his 9th fraction of radiation to his Chest. He denies pain. He does have chronic fatigue and is not sleeping well. He has a cough with brownish sputum. He is wheezing at times. Using oxygen at home at 3L. He is drinking Ensure 4-5 times a day. He drinks soups and juice. He has a sore throat that is better. He is taking carafate.He feels his swallowing has improved somewhat.                                  Set-up films were reviewed.                                 The chart was checked. Physical Findings. . .  height is '5\' 10"'$  (1.778 m) and weight is 181 lb 6.4 oz (82.3 kg). His oral temperature is 97.7 F (36.5 C). His blood pressure is 122/59 (abnormal) and his pulse is 62. His respiration is 18 and oxygen saturation is 100%. . The lungs are clear. The heart has a regular rhythm and rate. Patient continues to speak with a hoarse voice. Presents to the clinic in a wheelchair. Impression . . . . . . . The patient is tolerating radiation. Plan . . . . . . . . . . . . Continue treatment as planned. ________________________________   Blair Promise, PhD, MD  This document serves as a record of services personally performed by Gery Pray, MD. It was created on his behalf by Darcus Austin, a trained medical scribe. The creation of this record is based on the scribe's personal observations and the provider's statements to them. This document has been checked and approved by the attending provider.

## 2016-05-07 ENCOUNTER — Ambulatory Visit
Admission: RE | Admit: 2016-05-07 | Discharge: 2016-05-07 | Disposition: A | Discharge status: Home / Self Care | Payer: Medicare Other | Source: Ambulatory Visit | Attending: Radiation Oncology | Admitting: Radiation Oncology

## 2016-05-07 ENCOUNTER — Inpatient Hospital Stay: Payer: Medicare Other | Admitting: Internal Medicine

## 2016-05-07 DIAGNOSIS — C3401 Malignant neoplasm of right main bronchus: Secondary | ICD-10-CM | POA: Diagnosis not present

## 2016-05-07 DIAGNOSIS — Z9981 Dependence on supplemental oxygen: Secondary | ICD-10-CM | POA: Diagnosis not present

## 2016-05-07 DIAGNOSIS — Z510 Encounter for antineoplastic radiation therapy: Secondary | ICD-10-CM | POA: Diagnosis not present

## 2016-05-07 DIAGNOSIS — R1310 Dysphagia, unspecified: Secondary | ICD-10-CM | POA: Diagnosis not present

## 2016-05-07 DIAGNOSIS — Z7982 Long term (current) use of aspirin: Secondary | ICD-10-CM | POA: Diagnosis not present

## 2016-05-07 DIAGNOSIS — C3491 Malignant neoplasm of unspecified part of right bronchus or lung: Secondary | ICD-10-CM | POA: Diagnosis not present

## 2016-05-07 DIAGNOSIS — Z79899 Other long term (current) drug therapy: Secondary | ICD-10-CM | POA: Diagnosis not present

## 2016-05-08 ENCOUNTER — Ambulatory Visit
Admission: RE | Admit: 2016-05-08 | Discharge: 2016-05-08 | Disposition: A | Discharge status: Home / Self Care | Payer: Medicare Other | Source: Ambulatory Visit | Attending: Radiation Oncology | Admitting: Radiation Oncology

## 2016-05-08 DIAGNOSIS — C3491 Malignant neoplasm of unspecified part of right bronchus or lung: Secondary | ICD-10-CM | POA: Diagnosis not present

## 2016-05-08 DIAGNOSIS — Z510 Encounter for antineoplastic radiation therapy: Secondary | ICD-10-CM | POA: Diagnosis not present

## 2016-05-08 DIAGNOSIS — Z79899 Other long term (current) drug therapy: Secondary | ICD-10-CM | POA: Diagnosis not present

## 2016-05-08 DIAGNOSIS — Z9981 Dependence on supplemental oxygen: Secondary | ICD-10-CM | POA: Diagnosis not present

## 2016-05-08 DIAGNOSIS — Z7982 Long term (current) use of aspirin: Secondary | ICD-10-CM | POA: Diagnosis not present

## 2016-05-08 DIAGNOSIS — R1310 Dysphagia, unspecified: Secondary | ICD-10-CM | POA: Diagnosis not present

## 2016-05-08 DIAGNOSIS — C3401 Malignant neoplasm of right main bronchus: Secondary | ICD-10-CM | POA: Diagnosis not present

## 2016-05-09 ENCOUNTER — Ambulatory Visit
Admission: RE | Admit: 2016-05-09 | Discharge: 2016-05-09 | Disposition: A | Discharge status: Home / Self Care | Payer: Medicare Other | Source: Ambulatory Visit | Attending: Radiation Oncology | Admitting: Radiation Oncology

## 2016-05-09 DIAGNOSIS — R1310 Dysphagia, unspecified: Secondary | ICD-10-CM | POA: Diagnosis not present

## 2016-05-09 DIAGNOSIS — Z9981 Dependence on supplemental oxygen: Secondary | ICD-10-CM | POA: Diagnosis not present

## 2016-05-09 DIAGNOSIS — C3491 Malignant neoplasm of unspecified part of right bronchus or lung: Secondary | ICD-10-CM | POA: Diagnosis not present

## 2016-05-09 DIAGNOSIS — Z510 Encounter for antineoplastic radiation therapy: Secondary | ICD-10-CM | POA: Diagnosis not present

## 2016-05-09 DIAGNOSIS — Z79899 Other long term (current) drug therapy: Secondary | ICD-10-CM | POA: Diagnosis not present

## 2016-05-09 DIAGNOSIS — Z7982 Long term (current) use of aspirin: Secondary | ICD-10-CM | POA: Diagnosis not present

## 2016-05-09 DIAGNOSIS — C3401 Malignant neoplasm of right main bronchus: Secondary | ICD-10-CM | POA: Diagnosis not present

## 2016-05-11 ENCOUNTER — Other Ambulatory Visit: Payer: Self-pay | Admitting: Family Medicine

## 2016-05-12 ENCOUNTER — Ambulatory Visit
Admission: RE | Admit: 2016-05-12 | Discharge: 2016-05-12 | Disposition: A | Discharge status: Home / Self Care | Payer: Medicare Other | Source: Ambulatory Visit | Attending: Radiation Oncology | Admitting: Radiation Oncology

## 2016-05-12 DIAGNOSIS — Z510 Encounter for antineoplastic radiation therapy: Secondary | ICD-10-CM | POA: Diagnosis not present

## 2016-05-12 DIAGNOSIS — C3491 Malignant neoplasm of unspecified part of right bronchus or lung: Secondary | ICD-10-CM | POA: Diagnosis not present

## 2016-05-12 DIAGNOSIS — C3401 Malignant neoplasm of right main bronchus: Secondary | ICD-10-CM | POA: Diagnosis not present

## 2016-05-12 DIAGNOSIS — Z9981 Dependence on supplemental oxygen: Secondary | ICD-10-CM | POA: Diagnosis not present

## 2016-05-12 DIAGNOSIS — Z7982 Long term (current) use of aspirin: Secondary | ICD-10-CM | POA: Diagnosis not present

## 2016-05-12 DIAGNOSIS — Z79899 Other long term (current) drug therapy: Secondary | ICD-10-CM | POA: Diagnosis not present

## 2016-05-12 DIAGNOSIS — R1310 Dysphagia, unspecified: Secondary | ICD-10-CM | POA: Diagnosis not present

## 2016-05-12 NOTE — Telephone Encounter (Signed)
Medication refilled per protocol. 

## 2016-05-13 ENCOUNTER — Ambulatory Visit
Admission: RE | Admit: 2016-05-13 | Discharge: 2016-05-13 | Disposition: A | Discharge status: Home / Self Care | Payer: Medicare Other | Source: Ambulatory Visit | Attending: Radiation Oncology | Admitting: Radiation Oncology

## 2016-05-13 ENCOUNTER — Ambulatory Visit: Admission: RE | Admit: 2016-05-13 | Payer: Medicare Other | Source: Ambulatory Visit | Admitting: Radiation Oncology

## 2016-05-13 DIAGNOSIS — Z9981 Dependence on supplemental oxygen: Secondary | ICD-10-CM | POA: Diagnosis not present

## 2016-05-13 DIAGNOSIS — Z510 Encounter for antineoplastic radiation therapy: Secondary | ICD-10-CM | POA: Diagnosis not present

## 2016-05-13 DIAGNOSIS — C3401 Malignant neoplasm of right main bronchus: Secondary | ICD-10-CM | POA: Diagnosis not present

## 2016-05-13 DIAGNOSIS — C3491 Malignant neoplasm of unspecified part of right bronchus or lung: Secondary | ICD-10-CM | POA: Diagnosis not present

## 2016-05-13 DIAGNOSIS — Z79899 Other long term (current) drug therapy: Secondary | ICD-10-CM | POA: Diagnosis not present

## 2016-05-13 DIAGNOSIS — R1310 Dysphagia, unspecified: Secondary | ICD-10-CM | POA: Diagnosis not present

## 2016-05-13 DIAGNOSIS — Z7982 Long term (current) use of aspirin: Secondary | ICD-10-CM | POA: Diagnosis not present

## 2016-05-13 MED ORDER — SUCRALFATE 1 GM/10ML PO SUSP: 1.0000 g | Freq: Three times a day (TID) | ORAL | 3 refills | Status: DC

## 2016-05-14 ENCOUNTER — Ambulatory Visit
Admission: RE | Admit: 2016-05-14 | Discharge: 2016-05-14 | Disposition: A | Discharge status: Home / Self Care | Payer: Medicare Other | Source: Ambulatory Visit | Attending: Radiation Oncology | Admitting: Radiation Oncology

## 2016-05-14 DIAGNOSIS — Z7982 Long term (current) use of aspirin: Secondary | ICD-10-CM | POA: Diagnosis not present

## 2016-05-14 DIAGNOSIS — R1310 Dysphagia, unspecified: Secondary | ICD-10-CM | POA: Diagnosis not present

## 2016-05-14 DIAGNOSIS — Z510 Encounter for antineoplastic radiation therapy: Secondary | ICD-10-CM | POA: Diagnosis not present

## 2016-05-14 DIAGNOSIS — C3401 Malignant neoplasm of right main bronchus: Secondary | ICD-10-CM | POA: Diagnosis not present

## 2016-05-14 DIAGNOSIS — Z9981 Dependence on supplemental oxygen: Secondary | ICD-10-CM | POA: Diagnosis not present

## 2016-05-14 DIAGNOSIS — Z79899 Other long term (current) drug therapy: Secondary | ICD-10-CM | POA: Diagnosis not present

## 2016-05-14 DIAGNOSIS — C3491 Malignant neoplasm of unspecified part of right bronchus or lung: Secondary | ICD-10-CM | POA: Diagnosis not present

## 2016-05-15 ENCOUNTER — Ambulatory Visit
Admission: RE | Admit: 2016-05-15 | Discharge: 2016-05-15 | Disposition: A | Discharge status: Home / Self Care | Payer: Medicare Other | Source: Ambulatory Visit | Attending: Radiation Oncology | Admitting: Radiation Oncology

## 2016-05-15 DIAGNOSIS — C3491 Malignant neoplasm of unspecified part of right bronchus or lung: Secondary | ICD-10-CM | POA: Diagnosis not present

## 2016-05-15 DIAGNOSIS — R1310 Dysphagia, unspecified: Secondary | ICD-10-CM | POA: Diagnosis not present

## 2016-05-15 DIAGNOSIS — Z79899 Other long term (current) drug therapy: Secondary | ICD-10-CM | POA: Diagnosis not present

## 2016-05-15 DIAGNOSIS — Z9981 Dependence on supplemental oxygen: Secondary | ICD-10-CM | POA: Diagnosis not present

## 2016-05-15 DIAGNOSIS — C3401 Malignant neoplasm of right main bronchus: Secondary | ICD-10-CM | POA: Diagnosis not present

## 2016-05-15 DIAGNOSIS — Z510 Encounter for antineoplastic radiation therapy: Secondary | ICD-10-CM | POA: Diagnosis not present

## 2016-05-15 DIAGNOSIS — Z7982 Long term (current) use of aspirin: Secondary | ICD-10-CM | POA: Diagnosis not present

## 2016-05-16 ENCOUNTER — Ambulatory Visit (HOSPITAL_BASED_OUTPATIENT_CLINIC_OR_DEPARTMENT_OTHER): Payer: Medicare Other

## 2016-05-16 ENCOUNTER — Ambulatory Visit (HOSPITAL_BASED_OUTPATIENT_CLINIC_OR_DEPARTMENT_OTHER): Payer: Medicare Other | Admitting: Hematology & Oncology

## 2016-05-16 ENCOUNTER — Other Ambulatory Visit (HOSPITAL_BASED_OUTPATIENT_CLINIC_OR_DEPARTMENT_OTHER): Payer: Medicare Other

## 2016-05-16 ENCOUNTER — Ambulatory Visit
Admission: RE | Admit: 2016-05-16 | Discharge: 2016-05-16 | Disposition: A | Discharge status: Home / Self Care | Payer: Medicare Other | Source: Ambulatory Visit | Attending: Radiation Oncology | Admitting: Radiation Oncology

## 2016-05-16 VITALS — BP 131/61 | HR 75 | Temp 97.0°F | Resp 20 | Wt 178.4 lb

## 2016-05-16 DIAGNOSIS — Z79899 Other long term (current) drug therapy: Secondary | ICD-10-CM | POA: Diagnosis not present

## 2016-05-16 DIAGNOSIS — C3491 Malignant neoplasm of unspecified part of right bronchus or lung: Secondary | ICD-10-CM

## 2016-05-16 DIAGNOSIS — Z510 Encounter for antineoplastic radiation therapy: Secondary | ICD-10-CM | POA: Diagnosis not present

## 2016-05-16 DIAGNOSIS — Z9981 Dependence on supplemental oxygen: Secondary | ICD-10-CM | POA: Diagnosis not present

## 2016-05-16 DIAGNOSIS — C3401 Malignant neoplasm of right main bronchus: Secondary | ICD-10-CM | POA: Diagnosis not present

## 2016-05-16 DIAGNOSIS — Z7982 Long term (current) use of aspirin: Secondary | ICD-10-CM | POA: Diagnosis not present

## 2016-05-16 DIAGNOSIS — R1310 Dysphagia, unspecified: Secondary | ICD-10-CM | POA: Diagnosis not present

## 2016-05-16 LAB — CMP (CANCER CENTER ONLY)
ALK PHOS: 62 U/L (ref 26–84)
ALT: 19 U/L (ref 10–47)
AST: 17 U/L (ref 11–38)
Albumin: 3.3 g/dL (ref 3.3–5.5)
BUN, Bld: 11 mg/dL (ref 7–22)
CALCIUM: 9.3 mg/dL (ref 8.0–10.3)
CO2: 30 mEq/L (ref 18–33)
Chloride: 97 mEq/L — ABNORMAL LOW (ref 98–108)
Creat: 0.9 mg/dl (ref 0.6–1.2)
GLUCOSE: 106 mg/dL (ref 73–118)
POTASSIUM: 4.3 meq/L (ref 3.3–4.7)
Sodium: 136 mEq/L (ref 128–145)
Total Bilirubin: 0.6 mg/dl (ref 0.20–1.60)
Total Protein: 5.8 g/dL — ABNORMAL LOW (ref 6.4–8.1)

## 2016-05-16 LAB — CBC WITH DIFFERENTIAL (CANCER CENTER ONLY)
BASO#: 0 10*3/uL (ref 0.0–0.2)
BASO%: 0.9 % (ref 0.0–2.0)
EOS%: 2.2 % (ref 0.0–7.0)
Eosinophils Absolute: 0.1 10*3/uL (ref 0.0–0.5)
HEMATOCRIT: 34.3 % — AB (ref 38.7–49.9)
HGB: 11.5 g/dL — ABNORMAL LOW (ref 13.0–17.1)
LYMPH#: 0.7 10*3/uL — AB (ref 0.9–3.3)
LYMPH%: 21.8 % (ref 14.0–48.0)
MCH: 32.6 pg (ref 28.0–33.4)
MCHC: 33.5 g/dL (ref 32.0–35.9)
MCV: 97 fL (ref 82–98)
MONO#: 0.6 10*3/uL (ref 0.1–0.9)
MONO%: 20.2 % — ABNORMAL HIGH (ref 0.0–13.0)
NEUT#: 1.7 10*3/uL (ref 1.5–6.5)
NEUT%: 54.9 % (ref 40.0–80.0)
Platelets: 134 10*3/uL — ABNORMAL LOW (ref 145–400)
RBC: 3.53 10*6/uL — ABNORMAL LOW (ref 4.20–5.70)
RDW: 13.9 % (ref 11.1–15.7)
WBC: 3.2 10*3/uL — ABNORMAL LOW (ref 4.0–10.0)

## 2016-05-16 LAB — LACTATE DEHYDROGENASE: LDH: 184 U/L (ref 125–245)

## 2016-05-16 MED ORDER — HEPARIN SOD (PORK) LOCK FLUSH 100 UNIT/ML IV SOLN
500.0000 [IU] | Freq: Once | INTRAVENOUS | Status: AC
  Administered 2016-05-16: 500 [IU] via INTRAVENOUS
  Filled 2016-05-16: qty 5

## 2016-05-16 MED ORDER — SODIUM CHLORIDE 0.9% FLUSH
10.0000 mL | INTRAVENOUS | Status: DC | PRN
  Administered 2016-05-16: 10 mL via INTRAVENOUS
  Filled 2016-05-16: qty 10

## 2016-05-16 NOTE — Patient Instructions (Signed)
Implanted Port Insertion, Care After °This sheet gives you information about how to care for yourself after your procedure. Your health care provider may also give you more specific instructions. If you have problems or questions, contact your health care provider. °What can I expect after the procedure? °After your procedure, it is common to have: °· Discomfort at the port insertion site. °· Bruising on the skin over the port. This should improve over 3-4 days. ° °Follow these instructions at home: °Port care °· After your port is placed, you will get a manufacturer's information card. The card has information about your port. Keep this card with you at all times. °· Take care of the port as told by your health care provider. Ask your health care provider if you or a family member can get training for taking care of the port at home. A home health care nurse may also take care of the port. °· Make sure to remember what type of port you have. °Incision care °· Follow instructions from your health care provider about how to take care of your port insertion site. Make sure you: °? Wash your hands with soap and water before you change your bandage (dressing). If soap and water are not available, use hand sanitizer. °? Change your dressing as told by your health care provider. °? Leave stitches (sutures), skin glue, or adhesive strips in place. These skin closures may need to stay in place for 2 weeks or longer. If adhesive strip edges start to loosen and curl up, you may trim the loose edges. Do not remove adhesive strips completely unless your health care provider tells you to do that. °· Check your port insertion site every day for signs of infection. Check for: °? More redness, swelling, or pain. °? More fluid or blood. °? Warmth. °? Pus or a bad smell. °General instructions °· Do not take baths, swim, or use a hot tub until your health care provider approves. °· Do not lift anything that is heavier than 10 lb (4.5  kg) for a week, or as told by your health care provider. °· Ask your health care provider when it is okay to: °? Return to work or school. °? Resume usual physical activities or sports. °· Do not drive for 24 hours if you were given a medicine to help you relax (sedative). °· Take over-the-counter and prescription medicines only as told by your health care provider. °· Wear a medical alert bracelet in case of an emergency. This will tell any health care providers that you have a port. °· Keep all follow-up visits as told by your health care provider. This is important. °Contact a health care provider if: °· You cannot flush your port with saline as directed, or you cannot draw blood from the port. °· You have a fever or chills. °· You have more redness, swelling, or pain around your port insertion site. °· You have more fluid or blood coming from your port insertion site. °· Your port insertion site feels warm to the touch. °· You have pus or a bad smell coming from the port insertion site. °Get help right away if: °· You have chest pain or shortness of breath. °· You have bleeding from your port that you cannot control. °Summary °· Take care of the port as told by your health care provider. °· Change your dressing as told by your health care provider. °· Keep all follow-up visits as told by your health care provider. °  This information is not intended to replace advice given to you by your health care provider. Make sure you discuss any questions you have with your health care provider. °Document Released: 11/10/2012 Document Revised: 12/12/2015 Document Reviewed: 12/12/2015 °[employer] Interactive Patient Education © 2017 [employer] ° °

## 2016-05-16 NOTE — Progress Notes (Signed)
Hematology and Oncology Follow Up Visit  Bradley Meadows [id number] 01/30/1941 76 y.o. 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 He actually looks quite good. He still is not swallowing solids. He has had 13 radiation therapy treatments.  He is tolerating chemotherapy pretty well to date. He has not lost any hair.  He's had no proximal with pain.  His been no bleeding.  He's had no nausea or vomiting.  He's had a little bit of leg swelling.  Again, he is taking his liquids. He is doing some protein shakes. His weight seems to be holding on fairly well so far.   Overall,  his performance status is probably ECOG 1.  Medications:  Current Outpatient Prescriptions:  .  ALPRAZolam (XANAX) 0.5 MG tablet, Take one tablet 30 minutes prior to scan. (Patient not taking: Reported on 05/06/2016), Disp: 1 tablet, Rfl: 0 .  ALPRAZolam (XANAX) 0.5 MG tablet, Take 1 tablet (0.5 mg total) by mouth 3 (three) times daily as needed for anxiety (or prior to scan)., Disp: 15 tablet, Rfl: 0 .  aspirin 81 MG tablet, Take 1 tablet (81 mg total) by mouth daily., Disp: 1 tablet, Rfl: 3 .  atorvastatin (LIPITOR) 40 MG tablet, TAKE 1 TABLET(40 MG) BY MOUTH DAILY, Disp: 90 tablet, Rfl: 1 .  dexamethasone (DECADRON) 4 MG tablet, Take 2 tabs daily for three days after chemotherapy. Take with food. (Patient not taking: Reported on 05/06/2016), Disp: 30 tablet, Rfl: 1 .  emollient (BIAFINE) cream, Apply topically as needed., Disp: , Rfl:  .  fluconazole (DIFLUCAN) 40 MG/ML suspension, Take 5 mLs (200 mg total) by mouth daily., Disp: 140 mL, Rfl: 3 .  HYDROcodone-acetaminophen (NORCO/VICODIN) 5-325 MG tablet, Take 1 tablet by mouth 2 (two) times daily as needed for moderate pain. (Patient not taking: Reported on 05/06/2016), Disp: , Rfl:    .  Ipratropium-Albuterol (COMBIVENT RESPIMAT) 20-100 MCG/ACT AERS respimat, Inhale 2 puffs into the lungs every 6 (six) hours as needed for wheezing., Disp: , Rfl:  .  ipratropium-albuterol (DUONEB) 0.5-2.5 (3) MG/3ML SOLN, USE 1 VIAL VIA NEBULIZER EVERY 6 HOURS AS NEEDED FOR SHORTNESS OF BREATH OR WHEEZING (Patient not taking: Reported on 05/06/2016), Disp: 990 mL, Rfl: 0 .  lidocaine-prilocaine (EMLA) cream, Apply to port 1-2 hours before needle stick. Cover with plastic wrap. (Patient not taking: Reported on 05/06/2016), Disp: 30 g, Rfl: 0 .  metoprolol tartrate (LOPRESSOR) 25 MG tablet, Take 1 tablet (25 mg total) by mouth 2 (two) times daily., Disp: , Rfl:  .  Multiple Vitamin (MULTIVITAMIN) tablet, Take 1 tablet by mouth daily. Centrum Silver, Disp: , Rfl:  .  ondansetron (ZOFRAN) 8 MG tablet, Take 1 tablet (8 mg total) by mouth 2 (two) times daily as needed. Start on the third day after cisplatin chemotherapy. (Patient not taking: Reported on 05/06/2016), Disp: 30 tablet, Rfl: 1 .  prochlorperazine (COMPAZINE) 10 MG tablet, TAKE 1 TABLET(10 MG) BY MOUTH EVERY 6 HOURS AS NEEDED FOR NAUSEA OR VOMITING (Patient not taking: Reported on 05/06/2016), Disp: 385 tablet, Rfl: 1 .  STIOLTO RESPIMAT 2.5-2.5 MCG/ACT AERS, INHALE 2 PUFFS BY MOUTH DAILY, Disp: 4 g, Rfl: 2 No current facility-administered medications for this visit.   Facility-Administered Medications Ordered in Other Visits:  .  sodium chloride flush (  NS) 0.9 % injection 10 mL, 10 mL, Intravenous, PRN, Volanda Napoleon, MD, 10 mL at 05/16/16 1343  Allergies:  Allergies  Allergen Reactions  . No Known Allergies     Past Medical History, Surgical history, Social history, and Family History were reviewed and updated.  Review of Systems: As above  Physical Exam:  weight is 178 lb 6.4 oz (80.9 kg). His oral temperature is 97 F (36.1 C). His blood pressure is 131/61 and his pulse is 75. His respiration is 20.   Wt Readings from Last 3  Encounters:  05/16/16 178 lb 6.4 oz (80.9 kg)  05/06/16 181 lb 6.4 oz (82.3 kg)  04/29/16 184 lb (83.5 kg)     Well-developed and well-nourished white male. Head and neck exam shows obvious pharyngeal thrush. He has no adenopathy in the neck. There is no scleral icterus. Lungs are clear bilaterally. Cardiac exam regular rate and rhythm with a normal S1 and S2. There are no murmurs, rubs or bruits. Abdomen is soft area and he has good bowel sounds. There is no fluid wave. There is no palpable liver or spleen tip. Extremities shows no clubbing, cyanosis or edema. Skin exam shows no rashes, ecchymoses or petechia. Neurological exam shows no focal neurological deficits.     Lab Results  Component Value Date   WBC 3.2 (L) 05/16/2016   HGB 11.5 (L) 05/16/2016   HCT 34.3 (L) 05/16/2016   MCV 97 05/16/2016   PLT 134 (L) 05/16/2016     Chemistry      Component Value Date/Time   NA 136 05/16/2016 1140   NA 128 (L) 04/25/2016 1555   K 4.3 05/16/2016 1140   K 4.5 04/25/2016 1555   CL 97 (L) 05/16/2016 1140   CO2 30 05/16/2016 1140   CO2 26 04/25/2016 1555   BUN 11 05/16/2016 1140   BUN 18.6 04/25/2016 1555   CREATININE 0.9 05/16/2016 1140   CREATININE 0.7 04/25/2016 1555      Component Value Date/Time   CALCIUM 9.3 05/16/2016 1140   CALCIUM 8.7 04/25/2016 1555   ALKPHOS 62 05/16/2016 1140   ALKPHOS 51 04/25/2016 1555   AST 17 05/16/2016 1140   AST 14 04/25/2016 1555   ALT 19 05/16/2016 1140   ALT 17 04/25/2016 1555   BILITOT 0.60 05/16/2016 1140   BILITOT 0.65 04/25/2016 1555         Impression and Plan: Bradley Meadows is A 76 year old white male. 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 This is not operable. 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  Again, I think he is doing quite well. He is holding his own. His weight really has not gone down all that much.  We will go ahead with this  second cycle of treatment. He will start this on Monday.  I Rae't think we have to do any scans on him. I Lennart't know if radiation oncology is doing any planning scans.  I encouraged him to continue to drink quite a bit of liquid. Again protein shakes arche. Anything that has calories will be helpful.  We will plan to get him back to see [place] in 3 more weeks or sooner if necessary.  If he needs any IV fluids, we can certainly give him IV fluids.  I spent about  30 minutes with he and his daughter.   Volanda Napoleon, MD 4/13/20182:53 PM

## 2016-05-19 ENCOUNTER — Other Ambulatory Visit: Payer: Self-pay

## 2016-05-19 ENCOUNTER — Ambulatory Visit (HOSPITAL_BASED_OUTPATIENT_CLINIC_OR_DEPARTMENT_OTHER): Payer: Medicare Other

## 2016-05-19 ENCOUNTER — Other Ambulatory Visit: Payer: Self-pay | Admitting: Hematology & Oncology

## 2016-05-19 ENCOUNTER — Ambulatory Visit
Admission: RE | Admit: 2016-05-19 | Discharge: 2016-05-19 | Disposition: A | Discharge status: Home / Self Care | Payer: Medicare Other | Source: Ambulatory Visit | Attending: Radiation Oncology | Admitting: Radiation Oncology

## 2016-05-19 VITALS — BP 134/67 | HR 65 | Temp 98.0°F | Resp 20

## 2016-05-19 DIAGNOSIS — C3491 Malignant neoplasm of unspecified part of right bronchus or lung: Secondary | ICD-10-CM

## 2016-05-19 DIAGNOSIS — C3401 Malignant neoplasm of right main bronchus: Secondary | ICD-10-CM | POA: Diagnosis not present

## 2016-05-19 DIAGNOSIS — R1310 Dysphagia, unspecified: Secondary | ICD-10-CM | POA: Diagnosis not present

## 2016-05-19 DIAGNOSIS — Z5111 Encounter for antineoplastic chemotherapy: Secondary | ICD-10-CM

## 2016-05-19 DIAGNOSIS — Z7982 Long term (current) use of aspirin: Secondary | ICD-10-CM | POA: Diagnosis not present

## 2016-05-19 DIAGNOSIS — Z79899 Other long term (current) drug therapy: Secondary | ICD-10-CM | POA: Diagnosis not present

## 2016-05-19 DIAGNOSIS — Z510 Encounter for antineoplastic radiation therapy: Secondary | ICD-10-CM | POA: Diagnosis not present

## 2016-05-19 DIAGNOSIS — Z9981 Dependence on supplemental oxygen: Secondary | ICD-10-CM | POA: Diagnosis not present

## 2016-05-19 LAB — PREALBUMIN: PREALBUMIN: 16 mg/dL (ref 9–32)

## 2016-05-19 MED ORDER — PALONOSETRON HCL INJECTION 0.25 MG/5ML
0.2500 mg | Freq: Once | INTRAVENOUS | Status: AC
  Administered 2016-05-19: 0.25 mg via INTRAVENOUS

## 2016-05-19 MED ORDER — SODIUM CHLORIDE 0.9 % IV SOLN
Freq: Once | INTRAVENOUS | Status: AC
  Administered 2016-05-19: 13:00:00 via INTRAVENOUS
  Filled 2016-05-19: qty 5

## 2016-05-19 MED ORDER — POTASSIUM CHLORIDE 2 MEQ/ML IV SOLN
Freq: Once | INTRAVENOUS | Status: AC
  Administered 2016-05-19: 10:00:00 via INTRAVENOUS
  Filled 2016-05-19: qty 10

## 2016-05-19 MED ORDER — PALONOSETRON HCL INJECTION 0.25 MG/5ML
INTRAVENOUS | Status: AC
  Filled 2016-05-19: qty 5

## 2016-05-19 MED ORDER — SODIUM CHLORIDE 0.9 % IV SOLN
Freq: Once | INTRAVENOUS | Status: AC
  Administered 2016-05-19: 12:00:00 via INTRAVENOUS

## 2016-05-19 MED ORDER — PROCHLORPERAZINE MALEATE 10 MG PO TABS
ORAL_TABLET | ORAL | Status: AC
  Filled 2016-05-19: qty 1

## 2016-05-19 MED ORDER — SODIUM CHLORIDE 0.9 % IV SOLN
40.0000 mg/m2 | Freq: Once | INTRAVENOUS | Status: AC
  Administered 2016-05-19: 79 mg via INTRAVENOUS
  Filled 2016-05-19: qty 79

## 2016-05-19 MED ORDER — ETOPOSIDE CHEMO INJECTION 1 GM/50ML
40.0000 mg/m2 | Freq: Once | INTRAVENOUS | Status: AC
  Administered 2016-05-19: 80 mg via INTRAVENOUS
  Filled 2016-05-19: qty 4

## 2016-05-19 MED ORDER — FLUCONAZOLE 40 MG/ML PO SUSR: 200.0000 mg | Freq: Every day | ORAL | 3 refills | Status: DC

## 2016-05-19 NOTE — Patient Instructions (Signed)
[facility] Discharge Instructions for Patients Receiving Chemotherapy  Today you received the following chemotherapy agents Cisplatin and Etoposide  To help prevent nausea and vomiting after your treatment, we encourage you to take your nausea medication as directed. No  Zofran for 3 days. Take Compazine instead.   If you develop nausea and vomiting that is not controlled by your nausea medication, call the clinic.   BELOW ARE SYMPTOMS THAT SHOULD BE REPORTED IMMEDIATELY:  *FEVER GREATER THAN 100.5 F  *CHILLS WITH OR WITHOUT FEVER  NAUSEA AND VOMITING THAT IS NOT CONTROLLED WITH YOUR NAUSEA MEDICATION  *UNUSUAL SHORTNESS OF BREATH  *UNUSUAL BRUISING OR BLEEDING  TENDERNESS IN MOUTH AND THROAT WITH OR WITHOUT PRESENCE OF ULCERS  *URINARY PROBLEMS  *BOWEL PROBLEMS  UNUSUAL RASH Items with * indicate a potential emergency and should be followed up as soon as possible.  Feel free to call the clinic you have any questions or concerns. The clinic phone number is [phone].  Please show the [place] at check-in to the Emergency Department and triage nurse.

## 2016-05-20 ENCOUNTER — Ambulatory Visit
Admission: RE | Admit: 2016-05-20 | Discharge: 2016-05-20 | Disposition: A | Discharge status: Home / Self Care | Payer: Medicare Other | Source: Ambulatory Visit | Attending: Radiation Oncology | Admitting: Radiation Oncology

## 2016-05-20 ENCOUNTER — Ambulatory Visit (HOSPITAL_BASED_OUTPATIENT_CLINIC_OR_DEPARTMENT_OTHER): Payer: Medicare Other

## 2016-05-20 ENCOUNTER — Ambulatory Visit: Payer: Medicare Other

## 2016-05-20 VITALS — BP 117/72 | HR 71 | Temp 97.9°F | Resp 20

## 2016-05-20 DIAGNOSIS — Z79899 Other long term (current) drug therapy: Secondary | ICD-10-CM | POA: Diagnosis not present

## 2016-05-20 DIAGNOSIS — Z510 Encounter for antineoplastic radiation therapy: Secondary | ICD-10-CM | POA: Diagnosis not present

## 2016-05-20 DIAGNOSIS — C3491 Malignant neoplasm of unspecified part of right bronchus or lung: Secondary | ICD-10-CM | POA: Diagnosis not present

## 2016-05-20 DIAGNOSIS — C3401 Malignant neoplasm of right main bronchus: Secondary | ICD-10-CM | POA: Diagnosis not present

## 2016-05-20 DIAGNOSIS — Z9981 Dependence on supplemental oxygen: Secondary | ICD-10-CM | POA: Diagnosis not present

## 2016-05-20 DIAGNOSIS — Z5111 Encounter for antineoplastic chemotherapy: Secondary | ICD-10-CM | POA: Diagnosis not present

## 2016-05-20 DIAGNOSIS — Z7982 Long term (current) use of aspirin: Secondary | ICD-10-CM | POA: Diagnosis not present

## 2016-05-20 DIAGNOSIS — R1310 Dysphagia, unspecified: Secondary | ICD-10-CM | POA: Diagnosis not present

## 2016-05-20 MED ORDER — DEXAMETHASONE SODIUM PHOSPHATE 10 MG/ML IJ SOLN
INTRAMUSCULAR | Status: AC
  Filled 2016-05-20: qty 1

## 2016-05-20 MED ORDER — DEXAMETHASONE SODIUM PHOSPHATE 10 MG/ML IJ SOLN
10.0000 mg | Freq: Once | INTRAMUSCULAR | Status: AC
  Administered 2016-05-20: 10 mg via INTRAVENOUS

## 2016-05-20 MED ORDER — SODIUM CHLORIDE 0.9% FLUSH
10.0000 mL | INTRAVENOUS | Status: DC | PRN
  Administered 2016-05-20: 10 mL
  Filled 2016-05-20: qty 10

## 2016-05-20 MED ORDER — SODIUM CHLORIDE 0.9 % IV SOLN
40.0000 mg/m2 | Freq: Once | INTRAVENOUS | Status: AC
  Administered 2016-05-20: 80 mg via INTRAVENOUS
  Filled 2016-05-20: qty 4

## 2016-05-20 MED ORDER — HEPARIN SOD (PORK) LOCK FLUSH 100 UNIT/ML IV SOLN
500.0000 [IU] | Freq: Once | INTRAVENOUS | Status: AC | PRN
  Administered 2016-05-20: 500 [IU]
  Filled 2016-05-20: qty 5

## 2016-05-20 MED ORDER — SODIUM CHLORIDE 0.9 % IV SOLN
Freq: Once | INTRAVENOUS | Status: AC
  Administered 2016-05-20: 11:00:00 via INTRAVENOUS

## 2016-05-20 NOTE — Progress Notes (Signed)
Nutrition Follow-up:  Nutrition follow-up completed during infusion this am.    Patient reports that he is mainly drinking liquids at this time due to solid foods getting stuck (pointed to chest area).  Patient reports that when he tries to eat solid foods they get stuck and then is unable to eat until it is able to dislodge.  Patient reports that he is drinking about 5 ensure plus/boost per day.  Patient reports that he is not planning to get a feeding tube at this time.     Medications: reviewed  Labs: reviewed  Anthropometrics:   Patient's weight has decreased to 178 pounds 6.4 oz noted on 4/13 from 181 pounds 6.4 oz on 4/3, 184 lb on 3/27 and 189 lb 12.8 oz on 3/16.    6% weight loss in less than 1 month  NUTRITION DIAGNOSIS: Unintended weight loss continues   MALNUTRITION DIAGNOSIS: Patient meets criteria for severe malnutrition in context of acute illness likely progressing to chronic illness as evidenced by 6% weight loss in < 1 month and eating < or equal to 50% of energy needs for > or equal to 5 days.   INTERVENTION:  Recommend drinking ensure plus 6-7 per day to better meet nutritional needs and prevent further weight loss.  Discussed how patient can increase calories and protein of ensure. Encouraged pureeing foods to liquid consistency using broths, gravies, etc.  Provided patient with 2nd complimentary case of ensure plus. Patient to pick it up after chemotherapy today.    MONITORING, EVALUATION, GOAL: Patient will tolerate adequate calories and protein to minimize loss of lean body mass.     NEXT VISIT: May 1 after radiation appt   Peniel Hass B. Zenia Resides, [place], [place] Registered Dietitian [phone] (pager)

## 2016-05-20 NOTE — Patient Instructions (Signed)
[facility] Discharge Instructions for Patients Receiving Chemotherapy  Today you received the following chemotherapy agents: Etoposide   To help prevent nausea and vomiting after your treatment, we encourage you to take your nausea medication as directed.    If you develop nausea and vomiting that is not controlled by your nausea medication, call the clinic.   BELOW ARE SYMPTOMS THAT SHOULD BE REPORTED IMMEDIATELY:  *FEVER GREATER THAN 100.5 F  *CHILLS WITH OR WITHOUT FEVER  NAUSEA AND VOMITING THAT IS NOT CONTROLLED WITH YOUR NAUSEA MEDICATION  *UNUSUAL SHORTNESS OF BREATH  *UNUSUAL BRUISING OR BLEEDING  TENDERNESS IN MOUTH AND THROAT WITH OR WITHOUT PRESENCE OF ULCERS  *URINARY PROBLEMS  *BOWEL PROBLEMS  UNUSUAL RASH Items with * indicate a potential emergency and should be followed up as soon as possible.  Feel free to call the clinic you have any questions or concerns. The clinic phone number is [phone].  Please show the CHEMO ALERT CARD at check-in to the Emergency Department and triage nurse.   

## 2016-05-21 ENCOUNTER — Ambulatory Visit: Payer: Medicare Other

## 2016-05-21 ENCOUNTER — Ambulatory Visit
Admission: RE | Admit: 2016-05-21 | Discharge: 2016-05-21 | Disposition: A | Discharge status: Home / Self Care | Payer: Medicare Other | Source: Ambulatory Visit | Attending: Radiation Oncology | Admitting: Radiation Oncology

## 2016-05-21 ENCOUNTER — Ambulatory Visit (HOSPITAL_BASED_OUTPATIENT_CLINIC_OR_DEPARTMENT_OTHER): Payer: Medicare Other

## 2016-05-21 VITALS — BP 140/77 | HR 74 | Temp 98.5°F | Resp 20

## 2016-05-21 DIAGNOSIS — C3491 Malignant neoplasm of unspecified part of right bronchus or lung: Secondary | ICD-10-CM

## 2016-05-21 DIAGNOSIS — Z5111 Encounter for antineoplastic chemotherapy: Secondary | ICD-10-CM

## 2016-05-21 DIAGNOSIS — C3401 Malignant neoplasm of right main bronchus: Secondary | ICD-10-CM | POA: Diagnosis not present

## 2016-05-21 DIAGNOSIS — Z7982 Long term (current) use of aspirin: Secondary | ICD-10-CM | POA: Diagnosis not present

## 2016-05-21 DIAGNOSIS — Z510 Encounter for antineoplastic radiation therapy: Secondary | ICD-10-CM | POA: Diagnosis not present

## 2016-05-21 DIAGNOSIS — Z79899 Other long term (current) drug therapy: Secondary | ICD-10-CM | POA: Diagnosis not present

## 2016-05-21 DIAGNOSIS — R1310 Dysphagia, unspecified: Secondary | ICD-10-CM | POA: Diagnosis not present

## 2016-05-21 DIAGNOSIS — Z9981 Dependence on supplemental oxygen: Secondary | ICD-10-CM | POA: Diagnosis not present

## 2016-05-21 MED ORDER — DEXAMETHASONE SODIUM PHOSPHATE 10 MG/ML IJ SOLN
10.0000 mg | Freq: Once | INTRAMUSCULAR | Status: AC
  Administered 2016-05-21: 10 mg via INTRAVENOUS

## 2016-05-21 MED ORDER — DEXAMETHASONE SODIUM PHOSPHATE 10 MG/ML IJ SOLN
INTRAMUSCULAR | Status: AC
  Filled 2016-05-21: qty 1

## 2016-05-21 MED ORDER — SODIUM CHLORIDE 0.9 % IV SOLN
40.0000 mg/m2 | Freq: Once | INTRAVENOUS | Status: AC
  Administered 2016-05-21: 80 mg via INTRAVENOUS
  Filled 2016-05-21: qty 4

## 2016-05-21 MED ORDER — HEPARIN SOD (PORK) LOCK FLUSH 100 UNIT/ML IV SOLN
500.0000 [IU] | Freq: Once | INTRAVENOUS | Status: AC | PRN
  Administered 2016-05-21: 500 [IU]
  Filled 2016-05-21: qty 5

## 2016-05-21 MED ORDER — SODIUM CHLORIDE 0.9 % IV SOLN
Freq: Once | INTRAVENOUS | Status: AC
  Administered 2016-05-21: 10:00:00 via INTRAVENOUS

## 2016-05-21 MED ORDER — SODIUM CHLORIDE 0.9% FLUSH
10.0000 mL | INTRAVENOUS | Status: DC | PRN
  Administered 2016-05-21: 10 mL
  Filled 2016-05-21: qty 10

## 2016-05-21 NOTE — Patient Instructions (Signed)
[facility] Discharge Instructions for Patients Receiving Chemotherapy  Today you received the following chemotherapy agents: Etoposide   To help prevent nausea and vomiting after your treatment, we encourage you to take your nausea medication as directed.    If you develop nausea and vomiting that is not controlled by your nausea medication, call the clinic.   BELOW ARE SYMPTOMS THAT SHOULD BE REPORTED IMMEDIATELY:  *FEVER GREATER THAN 100.5 F  *CHILLS WITH OR WITHOUT FEVER  NAUSEA AND VOMITING THAT IS NOT CONTROLLED WITH YOUR NAUSEA MEDICATION  *UNUSUAL SHORTNESS OF BREATH  *UNUSUAL BRUISING OR BLEEDING  TENDERNESS IN MOUTH AND THROAT WITH OR WITHOUT PRESENCE OF ULCERS  *URINARY PROBLEMS  *BOWEL PROBLEMS  UNUSUAL RASH Items with * indicate a potential emergency and should be followed up as soon as possible.  Feel free to call the clinic you have any questions or concerns. The clinic phone number is [phone].  Please show the CHEMO ALERT CARD at check-in to the Emergency Department and triage nurse.   

## 2016-05-21 NOTE — Progress Notes (Signed)
Patient reported itching in hands during last 36mnutes of Etoposide infusion. Patient was itching yesterday and nurse determined it was dry skin. Gave lotion to patient today and he stated "it has eased off some". Patient reported itching again at end of flush. Consulted Dr. EMarin Olpand reported the symptoms. MD said patient should be ok and is adequate for discharge. Educated patient on signs and symptoms to watch for and to call [place] he has any non-emergent worsening symptoms. If anything emergent, call emergency services. Patient and sister voiced understanding.   MWylene Simmer BSN, RN 05/21/2016 1:03 PM

## 2016-05-22 ENCOUNTER — Ambulatory Visit
Admission: RE | Admit: 2016-05-22 | Discharge: 2016-05-22 | Disposition: A | Discharge status: Home / Self Care | Payer: Medicare Other | Source: Ambulatory Visit | Attending: Radiation Oncology | Admitting: Radiation Oncology

## 2016-05-22 ENCOUNTER — Ambulatory Visit: Payer: Medicare Other

## 2016-05-22 DIAGNOSIS — Z79899 Other long term (current) drug therapy: Secondary | ICD-10-CM | POA: Diagnosis not present

## 2016-05-22 DIAGNOSIS — C3401 Malignant neoplasm of right main bronchus: Secondary | ICD-10-CM | POA: Diagnosis not present

## 2016-05-22 DIAGNOSIS — Z9981 Dependence on supplemental oxygen: Secondary | ICD-10-CM | POA: Diagnosis not present

## 2016-05-22 DIAGNOSIS — R1310 Dysphagia, unspecified: Secondary | ICD-10-CM | POA: Diagnosis not present

## 2016-05-22 DIAGNOSIS — Z510 Encounter for antineoplastic radiation therapy: Secondary | ICD-10-CM | POA: Diagnosis not present

## 2016-05-22 DIAGNOSIS — C3491 Malignant neoplasm of unspecified part of right bronchus or lung: Secondary | ICD-10-CM | POA: Diagnosis not present

## 2016-05-22 DIAGNOSIS — Z7982 Long term (current) use of aspirin: Secondary | ICD-10-CM | POA: Diagnosis not present

## 2016-05-23 ENCOUNTER — Ambulatory Visit
Admission: RE | Admit: 2016-05-23 | Discharge: 2016-05-23 | Disposition: A | Discharge status: Home / Self Care | Payer: Medicare Other | Source: Ambulatory Visit | Attending: Radiation Oncology | Admitting: Radiation Oncology

## 2016-05-23 ENCOUNTER — Ambulatory Visit: Payer: Medicare Other

## 2016-05-23 DIAGNOSIS — Z79899 Other long term (current) drug therapy: Secondary | ICD-10-CM | POA: Diagnosis not present

## 2016-05-23 DIAGNOSIS — R1310 Dysphagia, unspecified: Secondary | ICD-10-CM | POA: Diagnosis not present

## 2016-05-23 DIAGNOSIS — Z7982 Long term (current) use of aspirin: Secondary | ICD-10-CM | POA: Diagnosis not present

## 2016-05-23 DIAGNOSIS — C3491 Malignant neoplasm of unspecified part of right bronchus or lung: Secondary | ICD-10-CM | POA: Diagnosis not present

## 2016-05-23 DIAGNOSIS — Z9981 Dependence on supplemental oxygen: Secondary | ICD-10-CM | POA: Diagnosis not present

## 2016-05-23 DIAGNOSIS — C3401 Malignant neoplasm of right main bronchus: Secondary | ICD-10-CM | POA: Diagnosis not present

## 2016-05-23 DIAGNOSIS — Z510 Encounter for antineoplastic radiation therapy: Secondary | ICD-10-CM | POA: Diagnosis not present

## 2016-05-26 ENCOUNTER — Ambulatory Visit: Payer: Medicare Other

## 2016-05-26 ENCOUNTER — Other Ambulatory Visit: Payer: Medicare Other

## 2016-05-26 ENCOUNTER — Ambulatory Visit
Admission: RE | Admit: 2016-05-26 | Discharge: 2016-05-26 | Disposition: A | Discharge status: Home / Self Care | Payer: Medicare Other | Source: Ambulatory Visit | Attending: Radiation Oncology | Admitting: Radiation Oncology

## 2016-05-26 ENCOUNTER — Ambulatory Visit: Payer: Medicare Other | Admitting: Hematology & Oncology

## 2016-05-26 DIAGNOSIS — Z79899 Other long term (current) drug therapy: Secondary | ICD-10-CM | POA: Diagnosis not present

## 2016-05-26 DIAGNOSIS — C3491 Malignant neoplasm of unspecified part of right bronchus or lung: Secondary | ICD-10-CM | POA: Diagnosis not present

## 2016-05-26 DIAGNOSIS — C3401 Malignant neoplasm of right main bronchus: Secondary | ICD-10-CM | POA: Diagnosis not present

## 2016-05-26 DIAGNOSIS — Z9981 Dependence on supplemental oxygen: Secondary | ICD-10-CM | POA: Diagnosis not present

## 2016-05-26 DIAGNOSIS — R1310 Dysphagia, unspecified: Secondary | ICD-10-CM | POA: Diagnosis not present

## 2016-05-26 DIAGNOSIS — Z510 Encounter for antineoplastic radiation therapy: Secondary | ICD-10-CM | POA: Diagnosis not present

## 2016-05-26 DIAGNOSIS — Z7982 Long term (current) use of aspirin: Secondary | ICD-10-CM | POA: Diagnosis not present

## 2016-05-27 ENCOUNTER — Ambulatory Visit
Admission: RE | Admit: 2016-05-27 | Discharge: 2016-05-27 | Disposition: A | Discharge status: Home / Self Care | Payer: Medicare Other | Source: Ambulatory Visit | Attending: Radiation Oncology | Admitting: Radiation Oncology

## 2016-05-27 DIAGNOSIS — Z9981 Dependence on supplemental oxygen: Secondary | ICD-10-CM | POA: Diagnosis not present

## 2016-05-27 DIAGNOSIS — C3491 Malignant neoplasm of unspecified part of right bronchus or lung: Secondary | ICD-10-CM | POA: Diagnosis not present

## 2016-05-27 DIAGNOSIS — Z79899 Other long term (current) drug therapy: Secondary | ICD-10-CM | POA: Diagnosis not present

## 2016-05-27 DIAGNOSIS — R1310 Dysphagia, unspecified: Secondary | ICD-10-CM | POA: Diagnosis not present

## 2016-05-27 DIAGNOSIS — Z510 Encounter for antineoplastic radiation therapy: Secondary | ICD-10-CM | POA: Diagnosis not present

## 2016-05-27 DIAGNOSIS — Z7982 Long term (current) use of aspirin: Secondary | ICD-10-CM | POA: Diagnosis not present

## 2016-05-27 DIAGNOSIS — C3401 Malignant neoplasm of right main bronchus: Secondary | ICD-10-CM | POA: Diagnosis not present

## 2016-05-28 ENCOUNTER — Ambulatory Visit
Admission: RE | Admit: 2016-05-28 | Discharge: 2016-05-28 | Disposition: A | Discharge status: Home / Self Care | Payer: Medicare Other | Source: Ambulatory Visit | Attending: Radiation Oncology | Admitting: Radiation Oncology

## 2016-05-28 DIAGNOSIS — R1310 Dysphagia, unspecified: Secondary | ICD-10-CM | POA: Diagnosis not present

## 2016-05-28 DIAGNOSIS — Z7982 Long term (current) use of aspirin: Secondary | ICD-10-CM | POA: Diagnosis not present

## 2016-05-28 DIAGNOSIS — Z9981 Dependence on supplemental oxygen: Secondary | ICD-10-CM | POA: Diagnosis not present

## 2016-05-28 DIAGNOSIS — C3491 Malignant neoplasm of unspecified part of right bronchus or lung: Secondary | ICD-10-CM | POA: Diagnosis not present

## 2016-05-28 DIAGNOSIS — C3401 Malignant neoplasm of right main bronchus: Secondary | ICD-10-CM | POA: Diagnosis not present

## 2016-05-28 DIAGNOSIS — Z510 Encounter for antineoplastic radiation therapy: Secondary | ICD-10-CM | POA: Diagnosis not present

## 2016-05-28 DIAGNOSIS — Z79899 Other long term (current) drug therapy: Secondary | ICD-10-CM | POA: Diagnosis not present

## 2016-05-29 ENCOUNTER — Other Ambulatory Visit: Payer: Medicare Other

## 2016-05-29 ENCOUNTER — Ambulatory Visit
Admission: RE | Admit: 2016-05-29 | Discharge: 2016-05-29 | Disposition: A | Discharge status: Home / Self Care | Payer: Medicare Other | Source: Ambulatory Visit | Attending: Radiation Oncology | Admitting: Radiation Oncology

## 2016-05-29 ENCOUNTER — Ambulatory Visit: Payer: Medicare Other | Admitting: Hematology & Oncology

## 2016-05-29 DIAGNOSIS — Z7982 Long term (current) use of aspirin: Secondary | ICD-10-CM | POA: Diagnosis not present

## 2016-05-29 DIAGNOSIS — C3491 Malignant neoplasm of unspecified part of right bronchus or lung: Secondary | ICD-10-CM | POA: Diagnosis not present

## 2016-05-29 DIAGNOSIS — Z510 Encounter for antineoplastic radiation therapy: Secondary | ICD-10-CM | POA: Diagnosis not present

## 2016-05-29 DIAGNOSIS — Z79899 Other long term (current) drug therapy: Secondary | ICD-10-CM | POA: Diagnosis not present

## 2016-05-29 DIAGNOSIS — R1310 Dysphagia, unspecified: Secondary | ICD-10-CM | POA: Diagnosis not present

## 2016-05-29 DIAGNOSIS — Z9981 Dependence on supplemental oxygen: Secondary | ICD-10-CM | POA: Diagnosis not present

## 2016-05-29 DIAGNOSIS — C3401 Malignant neoplasm of right main bronchus: Secondary | ICD-10-CM | POA: Diagnosis not present

## 2016-05-30 ENCOUNTER — Ambulatory Visit
Admission: RE | Admit: 2016-05-30 | Discharge: 2016-05-30 | Disposition: A | Discharge status: Home / Self Care | Payer: Medicare Other | Source: Ambulatory Visit | Attending: Radiation Oncology | Admitting: Radiation Oncology

## 2016-05-30 DIAGNOSIS — C3401 Malignant neoplasm of right main bronchus: Secondary | ICD-10-CM | POA: Diagnosis not present

## 2016-05-30 DIAGNOSIS — Z7982 Long term (current) use of aspirin: Secondary | ICD-10-CM | POA: Diagnosis not present

## 2016-05-30 DIAGNOSIS — Z510 Encounter for antineoplastic radiation therapy: Secondary | ICD-10-CM | POA: Diagnosis not present

## 2016-05-30 DIAGNOSIS — R1310 Dysphagia, unspecified: Secondary | ICD-10-CM | POA: Diagnosis not present

## 2016-05-30 DIAGNOSIS — C3491 Malignant neoplasm of unspecified part of right bronchus or lung: Secondary | ICD-10-CM | POA: Diagnosis not present

## 2016-05-30 DIAGNOSIS — Z9981 Dependence on supplemental oxygen: Secondary | ICD-10-CM | POA: Diagnosis not present

## 2016-05-30 DIAGNOSIS — Z79899 Other long term (current) drug therapy: Secondary | ICD-10-CM | POA: Diagnosis not present

## 2016-06-02 ENCOUNTER — Ambulatory Visit
Admission: RE | Admit: 2016-06-02 | Discharge: 2016-06-02 | Disposition: A | Discharge status: Home / Self Care | Payer: Medicare Other | Source: Ambulatory Visit | Attending: Radiation Oncology | Admitting: Radiation Oncology

## 2016-06-02 DIAGNOSIS — Z510 Encounter for antineoplastic radiation therapy: Secondary | ICD-10-CM | POA: Diagnosis not present

## 2016-06-02 DIAGNOSIS — Z7982 Long term (current) use of aspirin: Secondary | ICD-10-CM | POA: Diagnosis not present

## 2016-06-02 DIAGNOSIS — Z9981 Dependence on supplemental oxygen: Secondary | ICD-10-CM | POA: Diagnosis not present

## 2016-06-02 DIAGNOSIS — Z79899 Other long term (current) drug therapy: Secondary | ICD-10-CM | POA: Diagnosis not present

## 2016-06-02 DIAGNOSIS — C3491 Malignant neoplasm of unspecified part of right bronchus or lung: Secondary | ICD-10-CM | POA: Diagnosis not present

## 2016-06-02 DIAGNOSIS — R1310 Dysphagia, unspecified: Secondary | ICD-10-CM | POA: Diagnosis not present

## 2016-06-02 DIAGNOSIS — C3401 Malignant neoplasm of right main bronchus: Secondary | ICD-10-CM | POA: Diagnosis not present

## 2016-06-03 ENCOUNTER — Ambulatory Visit
Admission: RE | Admit: 2016-06-03 | Discharge: 2016-06-03 | Disposition: A | Discharge status: Home / Self Care | Payer: Medicare Other | Source: Ambulatory Visit | Attending: Radiation Oncology | Admitting: Radiation Oncology

## 2016-06-03 ENCOUNTER — Ambulatory Visit: Payer: Medicare Other

## 2016-06-03 DIAGNOSIS — C3491 Malignant neoplasm of unspecified part of right bronchus or lung: Secondary | ICD-10-CM | POA: Diagnosis not present

## 2016-06-03 DIAGNOSIS — Z510 Encounter for antineoplastic radiation therapy: Secondary | ICD-10-CM | POA: Diagnosis not present

## 2016-06-03 DIAGNOSIS — R1310 Dysphagia, unspecified: Secondary | ICD-10-CM | POA: Diagnosis not present

## 2016-06-03 DIAGNOSIS — Z7982 Long term (current) use of aspirin: Secondary | ICD-10-CM | POA: Diagnosis not present

## 2016-06-03 DIAGNOSIS — C3401 Malignant neoplasm of right main bronchus: Secondary | ICD-10-CM | POA: Diagnosis not present

## 2016-06-03 DIAGNOSIS — Z79899 Other long term (current) drug therapy: Secondary | ICD-10-CM | POA: Diagnosis not present

## 2016-06-03 DIAGNOSIS — Z9981 Dependence on supplemental oxygen: Secondary | ICD-10-CM | POA: Diagnosis not present

## 2016-06-03 NOTE — Progress Notes (Addendum)
Nutrition Follow-up:  Nutrition follow-up completed with patient and daughter following radiation.  Patient is followed by Dr. Marin Olp for lung cancer.  Patient receiving chemotherapy and radiation therapy.  Last radiation therapy noted to be on 06/04/16.  Patient reports that he continues to drink ensure plus up to 5 per day.  Reports that he bought a new blender and has been pureeing/grinding foods.  Reports on Sunday food got stuck in mid chest region and was unable to eat as much.    Noted per MD note not planning to attempt PEG tube again unless weight gets 160 pounds.  PEG tube has been attempted times 2 but was unsuccessful.   Medications: reviewed  Labs: reviewed  Anthropometrics:   Patient and daughter report that weight in radiation today was 175 lb 2 oz decreased from 178 pounds 6.4 oz on 4/13.    Patient reports that his UBW is 180-185 pounds where he is most comfortable weighing.  He reports that he gained so much weight (in the 200 lb range) prior to getting sick due to less activity from shortness of breath and sitting around more than normal.      NUTRITION DIAGNOSIS: Unintentional weight loss continues   MALNUTRITION DIAGNOSIS: Severe malnutrition continues   INTERVENTION:   Discussed importance of good nutrition and maintaining muscle mass.  Encouraged intake of at least 6-7 ensure plus per day to better meet nutritional needs and maintain weight.  Patient verbalized understanding. Provided patient with 3rd complimentary case of ensure plus today. Continued to encouraged high calorie, high protein foods using blender to puree foods to liquid consistency.      MONITORING, EVALUATION, GOAL: Patient will tolerate adequate calories and protein to minimize loss of lean body mass.   NEXT VISIT: May 15 during infusion  Bradley Meadows, [employer], [place] Registered Dietitian [phone] (pager)

## 2016-06-04 ENCOUNTER — Ambulatory Visit
Admission: RE | Admit: 2016-06-04 | Discharge: 2016-06-04 | Disposition: A | Discharge status: Home / Self Care | Payer: Medicare Other | Source: Ambulatory Visit | Attending: Radiation Oncology | Admitting: Radiation Oncology

## 2016-06-04 DIAGNOSIS — Z7982 Long term (current) use of aspirin: Secondary | ICD-10-CM | POA: Diagnosis not present

## 2016-06-04 DIAGNOSIS — C3401 Malignant neoplasm of right main bronchus: Secondary | ICD-10-CM | POA: Diagnosis not present

## 2016-06-04 DIAGNOSIS — Z9981 Dependence on supplemental oxygen: Secondary | ICD-10-CM | POA: Diagnosis not present

## 2016-06-04 DIAGNOSIS — Z79899 Other long term (current) drug therapy: Secondary | ICD-10-CM | POA: Diagnosis not present

## 2016-06-04 DIAGNOSIS — Z510 Encounter for antineoplastic radiation therapy: Secondary | ICD-10-CM | POA: Diagnosis not present

## 2016-06-04 DIAGNOSIS — C3491 Malignant neoplasm of unspecified part of right bronchus or lung: Secondary | ICD-10-CM | POA: Diagnosis not present

## 2016-06-04 DIAGNOSIS — R1310 Dysphagia, unspecified: Secondary | ICD-10-CM | POA: Diagnosis not present

## 2016-06-09 ENCOUNTER — Encounter: Payer: Self-pay | Admitting: Radiation Oncology

## 2016-06-09 NOTE — Progress Notes (Signed)
  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 During treatment, the patient reported improved breathing coughing. He was only consuming liquid foods, and reported constipation.  Plan: The patient has completed radiation treatment. The patient will return to radiation oncology clinic for routine followup in one month. I advised them to call or return sooner if they have any questions or concerns related to their recovery or treatment.  -----------------------------------  Blair Promise, PhD, MD  This document serves as a record of services personally performed by Gery Pray, MD. It was created on his behalf by Bethann Humble, a trained medical scribe. The creation of this record is based on the scribe's personal observations and the provider's statements to them. This document has been checked and approved by the attending provider.

## 2016-06-15 ENCOUNTER — Other Ambulatory Visit: Payer: Self-pay | Admitting: Family Medicine

## 2016-06-15 DIAGNOSIS — I10 Essential (primary) hypertension: Secondary | ICD-10-CM

## 2016-06-16 ENCOUNTER — Ambulatory Visit (HOSPITAL_BASED_OUTPATIENT_CLINIC_OR_DEPARTMENT_OTHER): Payer: Medicare Other | Admitting: Hematology & Oncology

## 2016-06-16 ENCOUNTER — Other Ambulatory Visit: Payer: Medicare Other

## 2016-06-16 ENCOUNTER — Ambulatory Visit (HOSPITAL_BASED_OUTPATIENT_CLINIC_OR_DEPARTMENT_OTHER): Payer: Medicare Other

## 2016-06-16 ENCOUNTER — Ambulatory Visit: Payer: Medicare Other

## 2016-06-16 ENCOUNTER — Ambulatory Visit (HOSPITAL_BASED_OUTPATIENT_CLINIC_OR_DEPARTMENT_OTHER)
Admission: RE | Admit: 2016-06-16 | Discharge: 2016-06-16 | Disposition: A | Discharge status: Home / Self Care | Payer: Medicare Other | Source: Ambulatory Visit | Attending: Hematology & Oncology | Admitting: Hematology & Oncology

## 2016-06-16 ENCOUNTER — Other Ambulatory Visit (HOSPITAL_BASED_OUTPATIENT_CLINIC_OR_DEPARTMENT_OTHER): Payer: Medicare Other

## 2016-06-16 VITALS — Wt 175.0 lb

## 2016-06-16 VITALS — BP 105/62 | HR 71 | Temp 98.9°F | Resp 17

## 2016-06-16 DIAGNOSIS — M7989 Other specified soft tissue disorders: Secondary | ICD-10-CM | POA: Insufficient documentation

## 2016-06-16 DIAGNOSIS — Z5111 Encounter for antineoplastic chemotherapy: Secondary | ICD-10-CM | POA: Diagnosis not present

## 2016-06-16 DIAGNOSIS — C3491 Malignant neoplasm of unspecified part of right bronchus or lung: Secondary | ICD-10-CM

## 2016-06-16 DIAGNOSIS — I2510 Atherosclerotic heart disease of native coronary artery without angina pectoris: Secondary | ICD-10-CM

## 2016-06-16 DIAGNOSIS — C7989 Secondary malignant neoplasm of other specified sites: Secondary | ICD-10-CM | POA: Diagnosis not present

## 2016-06-16 LAB — CMP (CANCER CENTER ONLY)
ALK PHOS: 61 U/L (ref 26–84)
ALT: 16 U/L (ref 10–47)
AST: 15 U/L (ref 11–38)
Albumin: 3.1 g/dL — ABNORMAL LOW (ref 3.3–5.5)
BUN, Bld: 9 mg/dL (ref 7–22)
CALCIUM: 9 mg/dL (ref 8.0–10.3)
CHLORIDE: 100 meq/L (ref 98–108)
CO2: 28 mEq/L (ref 18–33)
Creat: 0.7 mg/dl (ref 0.6–1.2)
GLUCOSE: 135 mg/dL — AB (ref 73–118)
Potassium: 4 mEq/L (ref 3.3–4.7)
Sodium: 132 mEq/L (ref 128–145)
TOTAL PROTEIN: 5.6 g/dL — AB (ref 6.4–8.1)
Total Bilirubin: 0.5 mg/dl (ref 0.20–1.60)

## 2016-06-16 LAB — CBC WITH DIFFERENTIAL (CANCER CENTER ONLY)
BASO#: 0 10*3/uL (ref 0.0–0.2)
BASO%: 0.4 % (ref 0.0–2.0)
EOS%: 2.7 % (ref 0.0–7.0)
Eosinophils Absolute: 0.1 10*3/uL (ref 0.0–0.5)
HEMATOCRIT: 34.5 % — AB (ref 38.7–49.9)
HGB: 11.8 g/dL — ABNORMAL LOW (ref 13.0–17.1)
LYMPH#: 0.5 10*3/uL — ABNORMAL LOW (ref 0.9–3.3)
LYMPH%: 10.2 % — AB (ref 14.0–48.0)
MCH: 32.8 pg (ref 28.0–33.4)
MCHC: 34.2 g/dL (ref 32.0–35.9)
MCV: 96 fL (ref 82–98)
MONO#: 0.6 10*3/uL (ref 0.1–0.9)
MONO%: 12.5 % (ref 0.0–13.0)
NEUT#: 3.8 10*3/uL (ref 1.5–6.5)
NEUT%: 74.2 % (ref 40.0–80.0)
Platelets: 159 10*3/uL (ref 145–400)
RBC: 3.6 10*6/uL — ABNORMAL LOW (ref 4.20–5.70)
RDW: 14.5 % (ref 11.1–15.7)
WBC: 5.1 10*3/uL (ref 4.0–10.0)

## 2016-06-16 MED ORDER — SODIUM CHLORIDE 0.9 % IV SOLN
40.0000 mg/m2 | Freq: Once | INTRAVENOUS | Status: AC
  Administered 2016-06-16: 80 mg via INTRAVENOUS
  Filled 2016-06-16: qty 4

## 2016-06-16 MED ORDER — POTASSIUM CHLORIDE 2 MEQ/ML IV SOLN
Freq: Once | INTRAVENOUS | Status: AC
  Administered 2016-06-16: 11:00:00 via INTRAVENOUS
  Filled 2016-06-16: qty 10

## 2016-06-16 MED ORDER — TRIAMTERENE-HCTZ 37.5-25 MG PO TABS: ORAL_TABLET | ORAL | 3 refills | Status: DC

## 2016-06-16 MED ORDER — HEPARIN SOD (PORK) LOCK FLUSH 100 UNIT/ML IV SOLN
500.0000 [IU] | Freq: Once | INTRAVENOUS | Status: AC | PRN
  Administered 2016-06-16: 500 [IU]
  Filled 2016-06-16: qty 5

## 2016-06-16 MED ORDER — SODIUM CHLORIDE 0.9 % IV SOLN
Freq: Once | INTRAVENOUS | Status: AC
  Administered 2016-06-16: 14:00:00 via INTRAVENOUS
  Filled 2016-06-16: qty 5

## 2016-06-16 MED ORDER — PALONOSETRON HCL INJECTION 0.25 MG/5ML
0.2500 mg | Freq: Once | INTRAVENOUS | Status: AC
  Administered 2016-06-16: 0.25 mg via INTRAVENOUS

## 2016-06-16 MED ORDER — SODIUM CHLORIDE 0.9 % IV SOLN
Freq: Once | INTRAVENOUS | Status: AC
  Administered 2016-06-16: 11:00:00 via INTRAVENOUS

## 2016-06-16 MED ORDER — SODIUM CHLORIDE 0.9% FLUSH
10.0000 mL | INTRAVENOUS | Status: DC | PRN
  Administered 2016-06-16: 10 mL
  Filled 2016-06-16: qty 10

## 2016-06-16 MED ORDER — SODIUM CHLORIDE 0.9 % IV SOLN
40.0000 mg/m2 | Freq: Once | INTRAVENOUS | Status: AC
  Administered 2016-06-16: 79 mg via INTRAVENOUS
  Filled 2016-06-16: qty 50

## 2016-06-16 MED ORDER — PALONOSETRON HCL INJECTION 0.25 MG/5ML
INTRAVENOUS | Status: AC
  Filled 2016-06-16: qty 5

## 2016-06-16 NOTE — Progress Notes (Unsigned)
UOP thus far this morning is 300 cc.

## 2016-06-16 NOTE — Progress Notes (Signed)
Hematology and Oncology Follow Up Visit  Bradley Meadows [id number] 08/30/1940 76 y.o. 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 Bradley Meadows is back for follow-up. He really has done remarkably well. He never needed a feeding tube. He completed 6000 rd of radiation on May 2. He got through this much better than I would've thought.  He is eating a little bit better. He still is having mostly soft food.  He is doing great with chemotherapy. He's had no nausea or vomiting. He's had no diarrhea. He's had some leg swelling with the right leg. This is somewhat tender. He is had for a couple weeks. He says that the swelling gets better at nighttime when he puts his legs up.  His voice is still on the hoarse side.  His weight is maintaining itself.  He's had no bleeding.  Overall,  his performance status is probably ECOG 1.  Medications:  Current Outpatient Prescriptions:  .  ALPRAZolam (XANAX) 0.5 MG tablet, Take one tablet 30 minutes prior to scan. (Patient not taking: Reported on 05/06/2016), Disp: 1 tablet, Rfl: 0 .  ALPRAZolam (XANAX) 0.5 MG tablet, Take 1 tablet (0.5 mg total) by mouth 3 (three) times daily as needed for anxiety (or prior to scan)., Disp: 15 tablet, Rfl: 0 .  aspirin 81 MG tablet, Take 1 tablet (81 mg total) by mouth daily., Disp: 1 tablet, Rfl: 3 .  atorvastatin (LIPITOR) 40 MG tablet, TAKE 1 TABLET(40 MG) BY MOUTH DAILY, Disp: 90 tablet, Rfl: 1 .  CARAFATE 1 GM/10ML suspension, , Disp: , Rfl:  .  COMBIVENT RESPIMAT 20-100 MCG/ACT AERS respimat, INHALE 1 PUFF BY MOUTH EVERY 6 HOURS AS NEEDED FOR WHEEZING, Disp: 4 g, Rfl: 0 .  dexamethasone (DECADRON) 4 MG tablet, Take 2 tabs daily for three days after chemotherapy. Take with food. (Patient not taking: Reported on 05/06/2016), Disp: 30  tablet, Rfl: 1 .  emollient (BIAFINE) cream, Apply topically as needed., Disp: , Rfl:  .  fluconazole (DIFLUCAN) 40 MG/ML suspension, Take 5 mLs (200 mg total) by mouth daily., Disp: 140 mL, Rfl: 3 .  HYDROcodone-acetaminophen (NORCO/VICODIN) 5-325 MG tablet, Take 1 tablet by mouth 2 (two) times daily as needed for moderate pain. (Patient not taking: Reported on 05/06/2016), Disp: , Rfl:  .  Ipratropium-Albuterol (COMBIVENT RESPIMAT) 20-100 MCG/ACT AERS respimat, Inhale 2 puffs into the lungs every 6 (six) hours as needed for wheezing., Disp: , Rfl:  .  ipratropium-albuterol (DUONEB) 0.5-2.5 (3) MG/3ML SOLN, USE 1 VIAL VIA NEBULIZER EVERY 6 HOURS AS NEEDED FOR SHORTNESS OF BREATH OR WHEEZING (Patient not taking: Reported on 05/06/2016), Disp: 990 mL, Rfl: 0 .  lidocaine-prilocaine (EMLA) cream, Apply to port 1-2 hours before needle stick. Cover with plastic wrap. (Patient not taking: Reported on 05/06/2016), Disp: 30 g, Rfl: 0 .  metoprolol tartrate (LOPRESSOR) 25 MG tablet, TAKE 1 TABLET(25 MG) BY MOUTH TWICE DAILY, Disp: 180 tablet, Rfl: 0 .  Multiple Vitamin (MULTIVITAMIN) tablet, Take 1 tablet by mouth daily. Centrum Silver, Disp: , Rfl:  .  ondansetron (ZOFRAN) 8 MG tablet, Take 1 tablet (8 mg total) by mouth 2 (two) times daily as needed. Start on the third day after cisplatin chemotherapy. (Patient not taking: Reported on 05/06/2016), Disp: 30 tablet, Rfl:  1 .  prochlorperazine (COMPAZINE) 10 MG tablet, TAKE 1 TABLET(10 MG) BY MOUTH EVERY 6 HOURS AS NEEDED FOR NAUSEA OR VOMITING (Patient not taking: Reported on 05/06/2016), Disp: 385 tablet, Rfl: 1 .  STIOLTO RESPIMAT 2.5-2.5 MCG/ACT AERS, INHALE 2 PUFFS BY MOUTH DAILY, Disp: 4 g, Rfl: 2  Allergies:  Allergies  Allergen Reactions  . No Known Allergies     Past Medical History, Surgical history, Social history, and Family History were reviewed and updated.  Review of Systems: As above  Physical Exam:  weight is 175 lb (79.4 kg).   Wt Readings  from Last 3 Encounters:  06/16/16 175 lb (79.4 kg)  05/16/16 178 lb 6.4 oz (80.9 kg)  05/06/16 181 lb 6.4 oz (82.3 kg)     Well-developed and well-nourished white male. Head and neck exam shows obvious pharyngeal thrush. He has no adenopathy in the neck. There is no scleral icterus. Lungs are clear bilaterally. Cardiac exam regular rate and rhythm with a normal S1 and S2. There are no murmurs, rubs or bruits. Abdomen is soft area and he has good bowel sounds. There is no fluid wave. There is no palpable liver or spleen tip. Extremities shows no clubbing, cyanosis or edema.There is some nonpitting edema of the right leg. He has a negative Homans's sign. No obvious palpable cord is noted. Skin exam shows no rashes, ecchymoses or petechia. Neurological exam shows no focal neurological deficits.     Lab Results  Component Value Date   WBC 5.1 06/16/2016   HGB 11.8 (L) 06/16/2016   HCT 34.5 (L) 06/16/2016   MCV 96 06/16/2016   PLT 159 06/16/2016     Chemistry      Component Value Date/Time   NA 136 05/16/2016 1140   NA 128 (L) 04/25/2016 1555   K 4.3 05/16/2016 1140   K 4.5 04/25/2016 1555   CL 97 (L) 05/16/2016 1140   CO2 30 05/16/2016 1140   CO2 26 04/25/2016 1555   BUN 11 05/16/2016 1140   BUN 18.6 04/25/2016 1555   CREATININE 0.9 05/16/2016 1140   CREATININE 0.7 04/25/2016 1555      Component Value Date/Time   CALCIUM 9.3 05/16/2016 1140   CALCIUM 8.7 04/25/2016 1555   ALKPHOS 62 05/16/2016 1140   ALKPHOS 51 04/25/2016 1555   AST 17 05/16/2016 1140   AST 14 04/25/2016 1555   ALT 19 05/16/2016 1140   ALT 17 04/25/2016 1555   BILITOT 0.60 05/16/2016 1140   BILITOT 0.65 04/25/2016 1555         Impression and Plan: Mr. Bradley Meadows is A 76 year old white male. 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 This is not operable. 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  Again, I think he is doing  quite well.   For his right leg swelling, we have to get a Doppler. I want to make sure that he does not have a blood clot in his leg.  At this point, we will give him his third cycle of treatment. After this cycle, we will go ahead and scan him. We will do a PET scan on him.  It never has been clear as whether he actually has metastatic disease.   I'm just glad that he is swallowing better. We probably will have to get him back to gastroenterology. I probably would not do this until after his PET scan. I told he and his daughter that  he might need to have an upper endoscopy and possibly esophageal dilation.   I'm just glad that he has done so well. He really looks quite good. I am very grateful that he did not need a feeding tube.   I spent about  30 minutes with he and his daughter.   Volanda Napoleon, MD 5/14/201810:02 AM   ADDENDUM:  The Doppler was done and there is no evidence of a thrombus in his right leg.  Lattie Haw, MD

## 2016-06-16 NOTE — Addendum Note (Signed)
Addended by: Burney Gauze R on: 06/16/2016 03:13 PM   Modules accepted: Orders

## 2016-06-16 NOTE — Patient Instructions (Signed)
[facility] Discharge Instructions for Patients Receiving Chemotherapy  Today you received the following chemotherapy agents CDDP, VP16  To help prevent nausea and vomiting after your treatment, we encourage you to take your nausea medication    If you develop nausea and vomiting that is not controlled by your nausea medication, call the clinic.   BELOW ARE SYMPTOMS THAT SHOULD BE REPORTED IMMEDIATELY:  *FEVER GREATER THAN 100.5 F  *CHILLS WITH OR WITHOUT FEVER  NAUSEA AND VOMITING THAT IS NOT CONTROLLED WITH YOUR NAUSEA MEDICATION  *UNUSUAL SHORTNESS OF BREATH  *UNUSUAL BRUISING OR BLEEDING  TENDERNESS IN MOUTH AND THROAT WITH OR WITHOUT PRESENCE OF ULCERS  *URINARY PROBLEMS  *BOWEL PROBLEMS  UNUSUAL RASH Items with * indicate a potential emergency and should be followed up as soon as possible.  Feel free to call the clinic you have any questions or concerns. The clinic phone number is [phone].  Please show the [place] at check-in to the Emergency Department and triage nurse.

## 2016-06-16 NOTE — Patient Instructions (Signed)
Implanted Port Insertion, Care After °This sheet gives you information about how to care for yourself after your procedure. Your health care provider may also give you more specific instructions. If you have problems or questions, contact your health care provider. °What can I expect after the procedure? °After your procedure, it is common to have: °· Discomfort at the port insertion site. °· Bruising on the skin over the port. This should improve over 3-4 days. ° °Follow these instructions at home: °Port care °· After your port is placed, you will get a manufacturer's information card. The card has information about your port. Keep this card with you at all times. °· Take care of the port as told by your health care provider. Ask your health care provider if you or a family member can get training for taking care of the port at home. A home health care nurse may also take care of the port. °· Make sure to remember what type of port you have. °Incision care °· Follow instructions from your health care provider about how to take care of your port insertion site. Make sure you: °? Wash your hands with soap and water before you change your bandage (dressing). If soap and water are not available, use hand sanitizer. °? Change your dressing as told by your health care provider. °? Leave stitches (sutures), skin glue, or adhesive strips in place. These skin closures may need to stay in place for 2 weeks or longer. If adhesive strip edges start to loosen and curl up, you may trim the loose edges. Do not remove adhesive strips completely unless your health care provider tells you to do that. °· Check your port insertion site every day for signs of infection. Check for: °? More redness, swelling, or pain. °? More fluid or blood. °? Warmth. °? Pus or a bad smell. °General instructions °· Do not take baths, swim, or use a hot tub until your health care provider approves. °· Do not lift anything that is heavier than 10 lb (4.5  kg) for a week, or as told by your health care provider. °· Ask your health care provider when it is okay to: °? Return to work or school. °? Resume usual physical activities or sports. °· Do not drive for 24 hours if you were given a medicine to help you relax (sedative). °· Take over-the-counter and prescription medicines only as told by your health care provider. °· Wear a medical alert bracelet in case of an emergency. This will tell any health care providers that you have a port. °· Keep all follow-up visits as told by your health care provider. This is important. °Contact a health care provider if: °· You cannot flush your port with saline as directed, or you cannot draw blood from the port. °· You have a fever or chills. °· You have more redness, swelling, or pain around your port insertion site. °· You have more fluid or blood coming from your port insertion site. °· Your port insertion site feels warm to the touch. °· You have pus or a bad smell coming from the port insertion site. °Get help right away if: °· You have chest pain or shortness of breath. °· You have bleeding from your port that you cannot control. °Summary °· Take care of the port as told by your health care provider. °· Change your dressing as told by your health care provider. °· Keep all follow-up visits as told by your health care provider. °  This information is not intended to replace advice given to you by your health care provider. Make sure you discuss any questions you have with your health care provider. °Document Released: 11/10/2012 Document Revised: 12/12/2015 Document Reviewed: 12/12/2015 °[employer] Interactive Patient Education © 2017 [employer] ° °

## 2016-06-16 NOTE — Telephone Encounter (Signed)
Medication refilled per protocol. 

## 2016-06-17 ENCOUNTER — Ambulatory Visit (HOSPITAL_BASED_OUTPATIENT_CLINIC_OR_DEPARTMENT_OTHER): Payer: Medicare Other

## 2016-06-17 ENCOUNTER — Ambulatory Visit: Payer: Medicare Other

## 2016-06-17 DIAGNOSIS — C3491 Malignant neoplasm of unspecified part of right bronchus or lung: Secondary | ICD-10-CM | POA: Diagnosis not present

## 2016-06-17 DIAGNOSIS — Z5111 Encounter for antineoplastic chemotherapy: Secondary | ICD-10-CM | POA: Diagnosis not present

## 2016-06-17 MED ORDER — DEXAMETHASONE SODIUM PHOSPHATE 10 MG/ML IJ SOLN
10.0000 mg | Freq: Once | INTRAMUSCULAR | Status: AC
  Administered 2016-06-17: 10 mg via INTRAVENOUS

## 2016-06-17 MED ORDER — SODIUM CHLORIDE 0.9 % IV SOLN
Freq: Once | INTRAVENOUS | Status: AC
  Administered 2016-06-17: 15:00:00 via INTRAVENOUS

## 2016-06-17 MED ORDER — SODIUM CHLORIDE 0.9% FLUSH
10.0000 mL | INTRAVENOUS | Status: DC | PRN
  Administered 2016-06-17: 10 mL
  Filled 2016-06-17: qty 10

## 2016-06-17 MED ORDER — HEPARIN SOD (PORK) LOCK FLUSH 100 UNIT/ML IV SOLN
500.0000 [IU] | Freq: Once | INTRAVENOUS | Status: AC | PRN
  Administered 2016-06-17: 500 [IU]
  Filled 2016-06-17: qty 5

## 2016-06-17 MED ORDER — DEXAMETHASONE SODIUM PHOSPHATE 10 MG/ML IJ SOLN
INTRAMUSCULAR | Status: AC
Start: 2016-06-17 — End: 2016-06-17
  Filled 2016-06-17: qty 1

## 2016-06-17 MED ORDER — SODIUM CHLORIDE 0.9 % IV SOLN
40.0000 mg/m2 | Freq: Once | INTRAVENOUS | Status: AC
  Administered 2016-06-17: 80 mg via INTRAVENOUS
  Filled 2016-06-17: qty 4

## 2016-06-17 NOTE — Patient Instructions (Signed)
[facility] Discharge Instructions for Patients Receiving Chemotherapy  Today you received the following chemotherapy agents CDDP, VP16  To help prevent nausea and vomiting after your treatment, we encourage you to take your nausea medication    If you develop nausea and vomiting that is not controlled by your nausea medication, call the clinic.   BELOW ARE SYMPTOMS THAT SHOULD BE REPORTED IMMEDIATELY:  *FEVER GREATER THAN 100.5 F  *CHILLS WITH OR WITHOUT FEVER  NAUSEA AND VOMITING THAT IS NOT CONTROLLED WITH YOUR NAUSEA MEDICATION  *UNUSUAL SHORTNESS OF BREATH  *UNUSUAL BRUISING OR BLEEDING  TENDERNESS IN MOUTH AND THROAT WITH OR WITHOUT PRESENCE OF ULCERS  *URINARY PROBLEMS  *BOWEL PROBLEMS  UNUSUAL RASH Items with * indicate a potential emergency and should be followed up as soon as possible.  Feel free to call the clinic you have any questions or concerns. The clinic phone number is [phone].  Please show the [facility] at check-in to the Emergency Department and triage nurse.

## 2016-06-17 NOTE — Progress Notes (Signed)
Nutrition Follow-up:   Nutrition follow-up completed in infusion.  Patient reports tomorrow is his last treatment.  He completed radiation on 5/2.    Reports that he feels like he is able to swallow more solid foods.  Reports that he has eaten eggs, pimento cheese sandwich, nabs and macaroni and cheese recently.  Reports eat drank 2 ensure and ate oatmeal this am.  Eating soup during visit.  Reports that he tries to drink 3-5 ensure per day.     Medications: reviewed  Labs: reviewed  Anthropometrics:   Noted weight on 5/14 of 175 lb stable from weight on 5/1.     NUTRITION DIAGNOSIS: Unintentional weight loss stable  MALNUTRITION DIAGNOSIS: Severe malnutrition stable   INTERVENTION:   Encouraged patient to continue to choose high calorie, high protein foods. Encouraged continued use of ensure plus for additional calories and protein.    MONITORING, EVALUATION, GOAL: Patient will tolerate adequate calories and protein to minimize loss of lean body mass.   NEXT VISIT: May 21    Bradley Meadows B. Zenia Resides, [place], [place] Registered Dietitian [phone] (pager)

## 2016-06-18 ENCOUNTER — Ambulatory Visit (HOSPITAL_BASED_OUTPATIENT_CLINIC_OR_DEPARTMENT_OTHER): Payer: Medicare Other

## 2016-06-18 VITALS — BP 132/68 | HR 67 | Temp 98.2°F | Resp 18

## 2016-06-18 DIAGNOSIS — C3491 Malignant neoplasm of unspecified part of right bronchus or lung: Secondary | ICD-10-CM

## 2016-06-18 DIAGNOSIS — Z5111 Encounter for antineoplastic chemotherapy: Secondary | ICD-10-CM | POA: Diagnosis not present

## 2016-06-18 MED ORDER — SODIUM CHLORIDE 0.9 % IV SOLN
40.0000 mg/m2 | Freq: Once | INTRAVENOUS | Status: AC
  Administered 2016-06-18: 80 mg via INTRAVENOUS
  Filled 2016-06-18: qty 4

## 2016-06-18 MED ORDER — DEXAMETHASONE SODIUM PHOSPHATE 10 MG/ML IJ SOLN
INTRAMUSCULAR | Status: AC
  Filled 2016-06-18: qty 1

## 2016-06-18 MED ORDER — HEPARIN SOD (PORK) LOCK FLUSH 100 UNIT/ML IV SOLN
500.0000 [IU] | Freq: Once | INTRAVENOUS | Status: AC | PRN
  Administered 2016-06-18: 500 [IU]
  Filled 2016-06-18: qty 5

## 2016-06-18 MED ORDER — DEXAMETHASONE SODIUM PHOSPHATE 10 MG/ML IJ SOLN
10.0000 mg | Freq: Once | INTRAMUSCULAR | Status: AC
  Administered 2016-06-18: 10 mg via INTRAVENOUS

## 2016-06-18 MED ORDER — SODIUM CHLORIDE 0.9 % IV SOLN
Freq: Once | INTRAVENOUS | Status: AC
  Administered 2016-06-18: 15:00:00 via INTRAVENOUS

## 2016-06-18 MED ORDER — SODIUM CHLORIDE 0.9% FLUSH
10.0000 mL | INTRAVENOUS | Status: DC | PRN
  Administered 2016-06-18: 10 mL
  Filled 2016-06-18: qty 10

## 2016-06-18 NOTE — Patient Instructions (Signed)
Cancer Center Discharge Instructions for Patients Receiving Chemotherapy  Today you received the following chemotherapy agents: Etoposide   To help prevent nausea and vomiting after your treatment, we encourage you to take your nausea medication as directed.    If you develop nausea and vomiting that is not controlled by your nausea medication, call the clinic.   BELOW ARE SYMPTOMS THAT SHOULD BE REPORTED IMMEDIATELY:  *FEVER GREATER THAN 100.5 F  *CHILLS WITH OR WITHOUT FEVER  NAUSEA AND VOMITING THAT IS NOT CONTROLLED WITH YOUR NAUSEA MEDICATION  *UNUSUAL SHORTNESS OF BREATH  *UNUSUAL BRUISING OR BLEEDING  TENDERNESS IN MOUTH AND THROAT WITH OR WITHOUT PRESENCE OF ULCERS  *URINARY PROBLEMS  *BOWEL PROBLEMS  UNUSUAL RASH Items with * indicate a potential emergency and should be followed up as soon as possible.  Feel free to call the clinic you have any questions or concerns. The clinic phone number is [phone].  Please show the CHEMO ALERT CARD at check-in to the Emergency Department and triage nurse.   

## 2016-06-19 ENCOUNTER — Ambulatory Visit: Payer: Medicare Other

## 2016-06-19 ENCOUNTER — Telehealth: Payer: Self-pay | Admitting: Nutrition

## 2016-06-19 NOTE — Telephone Encounter (Signed)
Pt's daughter cld to cancel appt w/Barbara. She stated that Dr. Marin Olp told them her father's weight is where he wanted it to be and didn't need this appt.

## 2016-06-20 ENCOUNTER — Ambulatory Visit: Payer: Medicare Other

## 2016-06-23 ENCOUNTER — Ambulatory Visit: Payer: Medicare Other

## 2016-06-23 ENCOUNTER — Encounter: Payer: Medicare Other | Admitting: Nutrition

## 2016-07-07 NOTE — Addendum Note (Signed)
Addendum  created 07/07/16 1021 by Ralyn Stlaurent, MD   Sign clinical note    

## 2016-07-08 ENCOUNTER — Encounter: Payer: Self-pay | Admitting: Oncology

## 2016-07-10 ENCOUNTER — Ambulatory Visit
Admission: RE | Admit: 2016-07-10 | Discharge: 2016-07-10 | Disposition: A | Discharge status: Home / Self Care | Payer: Medicare Other | Source: Ambulatory Visit | Attending: Radiation Oncology | Admitting: Radiation Oncology

## 2016-07-10 VITALS — BP 127/76 | HR 73 | Temp 97.8°F | Ht 70.0 in | Wt 176.6 lb

## 2016-07-10 DIAGNOSIS — C3491 Malignant neoplasm of unspecified part of right bronchus or lung: Secondary | ICD-10-CM

## 2016-07-10 DIAGNOSIS — R1310 Dysphagia, unspecified: Secondary | ICD-10-CM | POA: Diagnosis not present

## 2016-07-10 DIAGNOSIS — Z9981 Dependence on supplemental oxygen: Secondary | ICD-10-CM | POA: Diagnosis not present

## 2016-07-10 DIAGNOSIS — Z510 Encounter for antineoplastic radiation therapy: Secondary | ICD-10-CM | POA: Diagnosis not present

## 2016-07-10 DIAGNOSIS — Z7982 Long term (current) use of aspirin: Secondary | ICD-10-CM | POA: Diagnosis not present

## 2016-07-10 DIAGNOSIS — Z79899 Other long term (current) drug therapy: Secondary | ICD-10-CM | POA: Diagnosis not present

## 2016-07-10 NOTE — Progress Notes (Signed)
Radiation Oncology         [phone] ________________________________  Name: Bradley Meadows MRN: [id number]  Date: 07/10/2016  DOB: 1940-02-15  Follow-Up Visit Note  CC: Susy Frizzle, MD  Volanda Napoleon, MD    ICD-10-CM   1. 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 Patient denies any pain. He reports his breathing has improved and he only uses additional oxygen when it is humid. He reports a productive cough with yellow sputum. He denies hemoptysis. He reports he can eat solid foods but still has mild difficulty swallowing. He takes Carafate three times a day. He reports feeling fatigued. He has a PET scan scheduled on 08/08/16. He reports he has finished chemotherapy.                             ALLERGIES:  is allergic to no known allergies.  Meds: Current Outpatient Prescriptions  Medication Sig Dispense Refill  . aspirin 81 MG tablet Take 1 tablet (81 mg total) by mouth daily. 1 tablet 3  . atorvastatin (LIPITOR) 40 MG tablet TAKE 1 TABLET(40 MG) BY MOUTH DAILY 90 tablet 1  . CARAFATE 1 GM/10ML suspension     . COMBIVENT RESPIMAT 20-100 MCG/ACT AERS respimat INHALE 1 PUFF BY MOUTH EVERY 6 HOURS AS NEEDED FOR WHEEZING 4 g 0  . fluconazole (DIFLUCAN) 40 MG/ML suspension Take 5 mLs (200 mg total) by mouth daily. 140 mL 3  . Ipratropium-Albuterol (COMBIVENT RESPIMAT) 20-100 MCG/ACT AERS respimat Inhale 2 puffs into the lungs every 6 (six) hours as needed for wheezing.    . metoprolol tartrate (LOPRESSOR) 25 MG tablet TAKE 1 TABLET(25 MG) BY MOUTH TWICE DAILY 180 tablet 0  . ALPRAZolam (XANAX) 0.5 MG tablet Take one tablet 30 minutes  prior to scan. (Patient not taking: Reported on 05/06/2016) 1 tablet 0  . ALPRAZolam (XANAX) 0.5 MG tablet Take 1 tablet (0.5 mg total) by mouth 3 (three) times daily as needed for anxiety (or prior to scan). (Patient not taking: Reported on 07/10/2016) 15 tablet 0  . dexamethasone (DECADRON) 4 MG tablet Take 2 tabs daily for three days after chemotherapy. Take with food. (Patient not taking: Reported on 05/06/2016) 30 tablet 1  . HYDROcodone-acetaminophen (NORCO/VICODIN) 5-325 MG tablet Take 1 tablet by mouth 2 (two) times daily as needed for moderate pain. (Patient not taking: Reported on 05/06/2016)    . ipratropium-albuterol (DUONEB) 0.5-2.5 (3) MG/3ML SOLN USE 1 VIAL VIA NEBULIZER EVERY 6 HOURS AS NEEDED FOR SHORTNESS OF BREATH OR WHEEZING (Patient not taking: Reported on 05/06/2016) 990 mL 0  . lidocaine-prilocaine (EMLA) cream Apply to port 1-2 hours before needle stick. Cover with plastic wrap. (Patient not taking: Reported on 05/06/2016) 30 g 0  . Multiple Vitamin (MULTIVITAMIN) tablet Take 1 tablet by mouth daily. Centrum Silver    . ondansetron (ZOFRAN) 8 MG tablet Take 1 tablet (8 mg total) by mouth 2 (two) times daily as needed. Start on the third day after cisplatin chemotherapy. (Patient not taking: Reported on 05/06/2016) 30 tablet 1  . prochlorperazine (COMPAZINE) 10 MG tablet TAKE  1 TABLET(10 MG) BY MOUTH EVERY 6 HOURS AS NEEDED FOR NAUSEA OR VOMITING (Patient not taking: Reported on 05/06/2016) 385 tablet 1  . STIOLTO RESPIMAT 2.5-2.5 MCG/ACT AERS INHALE 2 PUFFS BY MOUTH DAILY (Patient not taking: Reported on 07/10/2016) 4 g 2  . triamterene-hydrochlorothiazide (MAXZIDE-25) 37.5-25 MG tablet Take 1 tablet daily for the next 3 days for leg swelling and then once daily as needed for leg swelling (Patient not taking: Reported on 07/10/2016) 30 tablet 3   No current facility-administered medications for this encounter.    Facility-Administered Medications Ordered in Other Encounters  Medication Dose Route  Frequency Provider Last Rate Last Dose  . sodium chloride flush (NS) 0.9 % injection 10 mL  10 mL Intracatheter PRN Volanda Napoleon, MD   10 mL at 06/16/16 1750    Physical Findings: The patient is in no acute distress. Patient is alert and oriented.  height is 5\' 10"  (1.778 m) and weight is 176 lb 9.6 oz (80.1 kg). His oral temperature is 97.8 F (36.6 C). His blood pressure is 127/76 and his pulse is 73. His oxygen saturation is 99%. .  No significant changes. Lungs are clear to auscultation bilaterally. Heart has regular rate and rhythm. No palpable cervical, supraclavicular, or axillary adenopathy. Abdomen soft, non-tender, normal bowel sounds.   Lab Findings: Lab Results  Component Value Date   WBC 5.1 06/16/2016   HGB 11.8 (L) 06/16/2016   HCT 34.5 (L) 06/16/2016   MCV 96 06/16/2016   PLT 159 06/16/2016    Radiographic Findings: US Venous Img Lower Unilateral Right  Result Date: 06/16/2016 CLINICAL DATA:  Right leg swelling for 2 weeks. History of metastatic lung cancer. Patient is on chemotherapy. History of edema, tobacco use, hypertension, COPD, CHF. EXAM: RIGHT LOWER EXTREMITY VENOUS DOPPLER ULTRASOUND TECHNIQUE: Gray-scale sonography with graded compression, as well as color Doppler and duplex ultrasound were performed to evaluate the lower extremity deep venous systems from the level of the common femoral vein and including the common femoral, femoral, profunda femoral, popliteal and calf veins including the posterior tibial, peroneal and gastrocnemius veins when visible. The superficial great saphenous vein was also interrogated. Spectral Doppler was utilized to evaluate flow at rest and with distal augmentation maneuvers in the common femoral, femoral and popliteal veins. COMPARISON:  None. FINDINGS: Contralateral Common Femoral Vein: Respiratory phasicity is normal and symmetric with the symptomatic side. No evidence of thrombus. Normal compressibility. Common Femoral Vein: No  evidence of thrombus. Normal compressibility, respiratory phasicity and response to augmentation. Saphenofemoral Junction: No evidence of thrombus. Normal compressibility and flow on color Doppler imaging. Profunda Femoral Vein: No evidence of thrombus. Normal compressibility and flow on color Doppler imaging. Femoral Vein: No evidence of thrombus. Normal compressibility, respiratory phasicity and response to augmentation. Popliteal Vein: No evidence of thrombus. Normal compressibility, respiratory phasicity and response to augmentation. Calf Veins: No evidence of thrombus. Normal compressibility and flow on color Doppler imaging. Superficial Great Saphenous Vein: No evidence of thrombus. Normal compressibility and flow on color Doppler imaging. Venous Reflux:  None. Other Findings:  None. IMPRESSION: No evidence of DVT within the right lower extremity. Electronically Signed   By: Nolon Nations M.D.   On: 06/16/2016 13:21    Impression:  The patient is recovering from the effects of radiation. No evidence of recurrence on clinical exam today.  Plan: Patient will follow-up in radiation oncology on a prn basis. Patient will remain under close follow-up with Dr. Marin Olp. Patient will have a PET  scan on 08/08/16. Patient complains of persistent hoarseness and may need to be seen by ENT to see if he has a paralyzed vocal cord. Patient does not wish to pursue this until after his PET scan results.  ____________________________________    This document serves as a record of services personally performed by Gery Pray, MD. It was created on his behalf by Bethann Humble, a trained medical scribe. The creation of this record is based on the scribe's personal observations and the provider's statements to them. This document has been checked and approved by the attending provider.

## 2016-07-10 NOTE — Progress Notes (Signed)
Bradley Meadows is here for follow up after treatment to his chest.  He denies having any pain.  He said his breathing is a lot better and only needs to use oxygen when it is humid.  He reports having a cough with a dark yellow sputum.  He denies having hemoptysis.  He reports he can eat solid foods now but still has some trouble swallowing with food getting stuck in his throat.  He takes carafate three times a day.  He reports feeling fatigued.  He has hyperpigmentation on his chest.  He has finished chemotherapy.  He will have a PET scan on 08/08/16.  BP 127/76 (BP Location: Left Arm, Patient Position: Sitting)   Pulse 73   Temp 97.8 F (36.6 C) (Oral)   Ht 5\' 10"  (1.778 m)   Wt 176 lb 9.6 oz (80.1 kg)   SpO2 99%   BMI 25.34 kg/m    Wt Readings from Last 3 Encounters:  07/10/16 176 lb 9.6 oz (80.1 kg)  06/16/16 175 lb (79.4 kg)  05/16/16 178 lb 6.4 oz (80.9 kg)

## 2016-07-15 ENCOUNTER — Encounter: Payer: Self-pay | Admitting: Family Medicine

## 2016-07-15 ENCOUNTER — Ambulatory Visit (INDEPENDENT_AMBULATORY_CARE_PROVIDER_SITE_OTHER): Payer: Medicare Other | Admitting: Family Medicine

## 2016-07-15 VITALS — BP 110/66 | HR 54 | Temp 98.4°F | Resp 20 | Ht 72.0 in | Wt 173.0 lb

## 2016-07-15 DIAGNOSIS — J449 Chronic obstructive pulmonary disease, unspecified: Secondary | ICD-10-CM

## 2016-07-15 DIAGNOSIS — I5022 Chronic systolic (congestive) heart failure: Secondary | ICD-10-CM

## 2016-07-15 DIAGNOSIS — C3491 Malignant neoplasm of unspecified part of right bronchus or lung: Secondary | ICD-10-CM | POA: Diagnosis not present

## 2016-07-15 DIAGNOSIS — I2510 Atherosclerotic heart disease of native coronary artery without angina pectoris: Secondary | ICD-10-CM

## 2016-07-15 MED ORDER — HYDROCODONE-ACETAMINOPHEN 5-325 MG PO TABS: 1.0000 | ORAL_TABLET | Freq: Two times a day (BID) | ORAL | Status: DC | PRN

## 2016-07-15 MED ORDER — HYDROCODONE-ACETAMINOPHEN 5-325 MG PO TABS: 1.0000 | ORAL_TABLET | Freq: Two times a day (BID) | ORAL | 0 refills | Status: DC | PRN

## 2016-07-15 NOTE — Progress Notes (Signed)
Subjective:    Patient ID: Bradley Meadows, male    DOB: 21-May-1940, 76 y.o.   MRN: [id number]  HPI  08/14/15 Patient reports 2 weeks of worsening dyspnea on exertion. He is still taking stiolto daily and combivent bid.  However he sees very little benefit from his breathing medicines. He is smoking again. He is audibly wheezing today on exam. He has markedly diminished breath sounds bilaterally with rhonchorous breath sounds and expiratory wheezing. Heart was in normal sinus rhythm with frequent PVCs. His weight is down approximately 12 pounds since his last office visit. He does have trace pretibial edema bilaterally but not severe. There is no evidence of JVD or overt failure. Past medical history as documented above is significant for congestive heart failure but the patient does not appear fluid overloaded today on examination. He denies any chest pain or angina.  My plan was: Patient seems to be suffering from a COPD exacerbation. I will give the patient 60 mg of Depo-Medrol IM 1 now. Patient was also given DuoNeb 3 mg of albuterol and 0.5 mg of Atrovent 1 now. I want him to go immediately for a chest x-ray. I will obtain a CBC to evaluate for anemia. Given his weight loss and he also reports trace hemoptysis, I want him to go get a chest x-ray immediately. I will also repeat an echocardiogram of his heart. Ejection fraction was 45% 2 years ago but I will evaluate for worsening of his systolic function. Begin prednisone 60 mg a day in addition to Levaquin 500 mg a day and recheck in 48 hours or sooner if worse.  Combivent 2 puff QID.  Quit Smoking!!!!  08/16/15 Patient is doing substantially better. He denies any fever or chest pain. His cough has improved. He is coughing up less mucus. His wheezing has improved. Chest x-ray revealed chronic bronchitic changes but no evidence of pneumonia or CHF. Lab work revealed a normal BNP and no evidence of anemia.  At that time, my plan was:  Clinically  improving. Finish Levaquin and prednisone. Continue Combivent 4 times a day. Continue stiolto daily. Recheck in one week if not 100% better or sooner if worse  12/26/15 Since I last saw the patient, he has had an echocardiogram which revealed an ejection fraction of 45-50%. However it also showed diffuse hypokinesis. He never followed up with cardiology as planned for the stress test. He states that he is having worsening shortness of breath, worsening dyspnea on exertion. He quit taking stiolto.  He is currently using Combivent 4 times a day with no benefit. He is 93% on room air. With ambulation, his oxygen drops to 88% quickly. On 2 L via nasal cannula, his oxygen increased to 98% on room air.  At that time, my plan was:  Patient's dyspnea is multifactorial. The majority is due to COPD. The patient recently quit smoking. I instructed him to resume stiolto 2 inhalations daily. Begin DuoNeb nebs 2.5/0.5 mg nebs every 6 hours when necessary. I will arrange for the patient to have oxygen at home 2 L via nasal cannula. I will try to schedule follow-up with cardiologist for that the patient can have a stress test to evaluate for possible causes of his global hypokinesis on echocardiogram. Recheck next week. Start the patient on prednisone 40 mg daily given the severity of his COPD. Will discontinue prednisone in one week if his breathing is improving  01/01/16 Patient's breathing is noticeably better. He is not wheezing as much  as he was last week. His lung sounds are improved. His dyspnea on exertion has improved. He is now resting comfortably in bed at night on oxygen. Overall he feels much better however he feels extremely anxious and jittery on high-dose prednisone.  AT that time, my plan was: On examination, breath sounds have improved noticeably. Wheezing is much better although still present. Patient's respiratory status seems improved. Subjectively he feels better. He is resting better at night since  starting oxygen. He denies any chest pain. Discontinue prednisone. Continue Stiolto 2 inhalations daily. Use DuoNeb nebs 2.5/0.5 one inhalation every 6 hours as needed for wheezing. Continue oxygen at night and as needed with activity. Continue to refrain from smoking. Recheck in one month. Follow-up with cardiology as planned.  01/31/16 Patient is scheduled to see the heart doctor on the 12th. He continues to report dyspnea on exertion. He is audibly wheezing today in the exam room. He is wearing oxygen at night but not during the day. He is not wearing oxygen with activity. He is still being compliant with stiolto.  He uses Combivent or DuoNeb nebs every 6 hours as needed. Unfortunately, he reports hemoptysis. This has been occurring over the last 6 weeks. There is trace hemoptysis in his sputum. He also reports worsening hoarseness. After 20 seconds, his voice will simply "go out". He denies any chest pain or fevers.  At that time, my plan was: I believe his dyspnea on exertion is primarily due to COPD. He is scheduled to see the heart doctor to complete the workup for the hypokinesis found on his echocardiogram and to rule out ischemia as a potential cause of his dyspnea. I expect that this will likely confirm that his shortness of breath is due to his severe COPD.  Continue stiolto.  I recommended that he use oxygen with activity. We can consider putting him on a standing dose of prednisone if necessary for shortness of breath but will defer that at the present time. Right now I'm very concerned by his hemoptysis and hoarseness. I have recommended a CT scan of the lungs to evaluate for possible causes of hemoptysis. I will also schedule the patient to see ENT for laryngoscopy given his chronic hoarseness and hemoptysis to rule out a lesion on his vocal cords. Await the results of these referrals. Patient received his flu shot today.  07/15/16 Unfortunately, the workup at that time revealed a mass near his  esophagus. 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 PET scan shows possible metastasis to gastrohepatic ligament. 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 He has undergone 3 cycles of chemotherapy and received radiation treatments. He is scheduled for follow-up with his oncologist to repeat a PET scan. Surprisingly he has done extremely well. His cancer is nonoperable however his swallowing is much better after the chemotherapy and radiation treatments. For instance, yesterday he was able to eat eggs, ham sandwich, and a liver putting sandwich without difficulty. His strength is improving. His spirits seem upbeat. His shortness of breath is improving. He states that his breathing is approximately 80% improved compared to his last visit after apparent shrinking of the mass pressing on his trachea.  He continues to use hydrocodone as needed for joint pains and back pains. He is using 1 tablet daily as needed. He is requesting a refill on this. He is occasionally smoking and I counseled him not to do this. He is also taking a protein supplement at  least once a day with his meals.   Past Medical History:  Diagnosis Date  . Arthritis   . Asthma   . CHF (congestive heart failure) (HCC)    ef=45-50%  . COPD (chronic obstructive pulmonary disease) ([place])    home O2  . Coronary artery disease    40-50% mLAD, 40% pRCA, 30% mRCA, occluded CX with retrograde filling 06/01/06 cath  . GERD (gastroesophageal reflux disease)    ulcers  . Goals of care, counseling/discussion 04/07/2016  . History of external beam radiation therapy 04/24/16-06/04/16   chest 60 Gy in 30 fractions  . Hyperlipidemia   . Hypertension   . Lung cancer, primary, 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, right ([place]) 04/07/2016  . PVC's (premature ventricular contractions)    2017  . Squamous cell lung cancer, right ([place]) 04/07/2016   Past Surgical History:  Procedure Laterality Date  .  ESOPHAGOGASTRODUODENOSCOPY (EGD) WITH PROPOFOL N/A 02/26/2016   Procedure: ESOPHAGOGASTRODUODENOSCOPY (EGD) WITH PROPOFOL;  Surgeon: Irene Shipper, MD;  Location: [facility];  Service: Endoscopy;  Laterality: N/A;  . EXPLORATORY LAPAROTOMY     for ulcer  "washed me out with a hose pipe"  . EYE SURGERY     cataracts  . HERNIA REPAIR    . IR GENERIC HISTORICAL  04/10/2016   IR US GUIDE VASC ACCESS RIGHT 04/10/2016 Markus Daft, MD [facility]  . IR GENERIC HISTORICAL  04/10/2016   IR FLUORO GUIDE PORT INSERTION RIGHT 04/10/2016 Markus Daft, MD [facility]  . IR GENERIC HISTORICAL  04/10/2016   IR FLUORO RM 30-60 MIN 04/10/2016 Markus Daft, MD [facility]  . IR GENERIC HISTORICAL  04/18/2016   IR FLUORO RM 30-60 MIN 04/18/2016 Greggory Keen, MD [facility]  . JOINT REPLACEMENT     knee both  . PORTACATH PLACEMENT Right 04/10/2016  . VIDEO BRONCHOSCOPY WITH ENDOBRONCHIAL ULTRASOUND N/A 03/26/2016   Procedure: VIDEO BRONCHOSCOPY WITH ENDOBRONCHIAL ULTRASOUND;  Surgeon: Collene Gobble, MD;  Location: [facility];  Service: Thoracic;  Laterality: N/A;   Current Outpatient Prescriptions on File Prior to Visit  Medication Sig Dispense Refill  . aspirin 81 MG tablet Take 1 tablet (81 mg total) by mouth daily. 1 tablet 3  . COMBIVENT RESPIMAT 20-100 MCG/ACT AERS respimat INHALE 1 PUFF BY MOUTH EVERY 6 HOURS AS NEEDED FOR WHEEZING 4 g 0  . fluconazole (DIFLUCAN) 40 MG/ML suspension Take 5 mLs (200 mg total) by mouth daily. 140 mL 3  . Ipratropium-Albuterol (COMBIVENT RESPIMAT) 20-100 MCG/ACT AERS respimat Inhale 2 puffs into the lungs every 6 (six) hours as needed for wheezing.    . metoprolol tartrate (LOPRESSOR) 25 MG tablet TAKE 1 TABLET(25 MG) BY MOUTH TWICE DAILY 180 tablet 0  . Multiple Vitamin (MULTIVITAMIN) tablet Take 1 tablet by mouth daily. Centrum Silver    . STIOLTO RESPIMAT 2.5-2.5 MCG/ACT AERS INHALE 2 PUFFS BY MOUTH DAILY 4 g 2  . triamterene-hydrochlorothiazide (MAXZIDE-25) 37.5-25 MG tablet Take 1  tablet daily for the next 3 days for leg swelling and then once daily as needed for leg swelling 30 tablet 3  . ALPRAZolam (XANAX) 0.5 MG tablet Take one tablet 30 minutes prior to scan. (Patient not taking: Reported on 05/06/2016) 1 tablet 0  . atorvastatin (LIPITOR) 40 MG tablet TAKE 1 TABLET(40 MG) BY MOUTH DAILY (Patient not taking: Reported on 07/15/2016) 90 tablet 1  . CARAFATE 1 GM/10ML suspension     . ipratropium-albuterol (DUONEB) 0.5-2.5 (3) MG/3ML SOLN USE 1 VIAL VIA  NEBULIZER EVERY 6 HOURS AS NEEDED FOR SHORTNESS OF BREATH OR WHEEZING (Patient not taking: Reported on 05/06/2016) 990 mL 0  . lidocaine-prilocaine (EMLA) cream Apply to port 1-2 hours before needle stick. Cover with plastic wrap. (Patient not taking: Reported on 05/06/2016) 30 g 0   Current Facility-Administered Medications on File Prior to Visit  Medication Dose Route Frequency Provider Last Rate Last Dose  . sodium chloride flush (NS) 0.9 % injection 10 mL  10 mL Intracatheter PRN Volanda Napoleon, MD   10 mL at 06/16/16 1750   Allergies  Allergen Reactions  . No Known Allergies    Social History   Social History  . Marital status: Divorced    Spouse name: N/A  . Number of children: 3  . Years of education: 9   Occupational History  . RetiredAnimal nutritionist Retired   Social History Main Topics  . Smoking status: Former Smoker    Packs/day: 1.50    Years: 30.00    Types: Cigarettes    Quit date: 09/29/2015  . Smokeless tobacco: Never Used  . Alcohol use No  . Drug use: No  . Sexual activity: Not Currently   Other Topics Concern  . Not on file   Social History Narrative   Grew up in [place] area.    Left school after 9th grade. No father and had to work.    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       Health Care POA:    Emergency Contact: sister, Hector Brunswick (h) [phone]   End of Life Plan:    Who lives with you: self   Any pets: Lorrin Goodell   Diet: Pt limits sugars and starches and  focuses on vegetables an protein. Pt has lost 50 lbs over several years.   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   Seatbelts: Pt reports wearing seatbelt when in vehicle.   Sun Exposure/Protection: Pt does not use sun protectin.   Hobbies: Racing, TV, gardening, Architect            Review of Systems  All other systems reviewed and are negative.      Objective:   Physical Exam  Constitutional: He is oriented to person, place, and time. He appears well-developed and well-nourished. No distress.  Neck: Neck supple. No JVD present. No thyromegaly present.  Cardiovascular: Normal rate, regular rhythm, normal heart sounds and intact distal pulses.   No murmur heard. Pulmonary/Chest: Effort normal. No respiratory distress. He has decreased breath sounds. He has wheezes. He has no rhonchi. He has no rales.  Abdominal: Soft. Bowel sounds are normal. He exhibits no distension. There is no tenderness. There is no rebound and no guarding.  Musculoskeletal: He exhibits edema.  Lymphadenopathy:    He has no cervical adenopathy.  Neurological: He is alert and oriented to person, place, and time. He has normal reflexes. No cranial nerve deficit. He exhibits normal muscle tone. Coordination normal.  Skin: He is not diaphoretic.  Vitals reviewed.   hoarse      Assessment & Plan:  COPD (chronic obstructive pulmonary disease) with chronic bronchitis (HCC)  Chronic systolic heart failure (HCC)  Squamous cell lung cancer, right (HCC)  Lung cancer, primary, 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, right [facility])  Patient has multiple medical comorbidities. 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 That being said, he is doing remarkably  well with treatment. His breathing is better. His swallowing is better. He denies any dysphagia. His strength is improving.  All of this is reassuring. I reviewed his most recent lab work obtained in May which showed stable lab days regarding his CBC and CMP. I see no reason to repeat that at the present time. Currently he is asymptomatic from his congestive heart failure. Therefore I'll not add any other medications as he is still undergoing chemotherapy. His COPD is currently stable on his long-acting bronchodilators. I continue to encourage the patient to avoid smoking. I refilled his pain medication and I gave him 90 tablets of that we will have to travel to the office once a month for medication.

## 2016-08-08 ENCOUNTER — Ambulatory Visit (HOSPITAL_COMMUNITY)
Admission: RE | Admit: 2016-08-08 | Discharge: 2016-08-08 | Disposition: A | Discharge status: Home / Self Care | Payer: Medicare Other | Source: Ambulatory Visit | Attending: Hematology & Oncology | Admitting: Hematology & Oncology

## 2016-08-08 ENCOUNTER — Ambulatory Visit: Payer: Medicare Other | Admitting: Internal Medicine

## 2016-08-08 DIAGNOSIS — M7989 Other specified soft tissue disorders: Secondary | ICD-10-CM | POA: Diagnosis not present

## 2016-08-08 DIAGNOSIS — K8020 Calculus of gallbladder without cholecystitis without obstruction: Secondary | ICD-10-CM | POA: Insufficient documentation

## 2016-08-08 DIAGNOSIS — J439 Emphysema, unspecified: Secondary | ICD-10-CM | POA: Insufficient documentation

## 2016-08-08 DIAGNOSIS — C3491 Malignant neoplasm of unspecified part of right bronchus or lung: Secondary | ICD-10-CM

## 2016-08-08 DIAGNOSIS — R911 Solitary pulmonary nodule: Secondary | ICD-10-CM | POA: Diagnosis not present

## 2016-08-08 LAB — GLUCOSE, CAPILLARY: Glucose-Capillary: 114 mg/dL — ABNORMAL HIGH (ref 65–99)

## 2016-08-08 MED ORDER — FLUDEOXYGLUCOSE F - 18 (FDG) INJECTION
9.2400 | Freq: Once | INTRAVENOUS | Status: AC | PRN
  Administered 2016-08-08: 9.24 via INTRAVENOUS

## 2016-08-15 ENCOUNTER — Other Ambulatory Visit: Payer: Medicare Other

## 2016-08-15 ENCOUNTER — Ambulatory Visit: Payer: Medicare Other | Admitting: Hematology & Oncology

## 2016-08-15 ENCOUNTER — Ambulatory Visit: Payer: Medicare Other | Admitting: Family

## 2016-08-22 ENCOUNTER — Other Ambulatory Visit (HOSPITAL_BASED_OUTPATIENT_CLINIC_OR_DEPARTMENT_OTHER): Payer: Medicare Other

## 2016-08-22 ENCOUNTER — Ambulatory Visit (HOSPITAL_BASED_OUTPATIENT_CLINIC_OR_DEPARTMENT_OTHER): Payer: Medicare Other

## 2016-08-22 ENCOUNTER — Ambulatory Visit (HOSPITAL_BASED_OUTPATIENT_CLINIC_OR_DEPARTMENT_OTHER): Payer: Medicare Other | Admitting: Hematology & Oncology

## 2016-08-22 VITALS — BP 128/71 | HR 63 | Temp 98.7°F | Resp 20 | Wt 180.0 lb

## 2016-08-22 DIAGNOSIS — C3491 Malignant neoplasm of unspecified part of right bronchus or lung: Secondary | ICD-10-CM

## 2016-08-22 DIAGNOSIS — K4090 Unilateral inguinal hernia, without obstruction or gangrene, not specified as recurrent: Secondary | ICD-10-CM

## 2016-08-22 DIAGNOSIS — R5383 Other fatigue: Secondary | ICD-10-CM

## 2016-08-22 DIAGNOSIS — M7989 Other specified soft tissue disorders: Secondary | ICD-10-CM

## 2016-08-22 DIAGNOSIS — Z95828 Presence of other vascular implants and grafts: Secondary | ICD-10-CM

## 2016-08-22 LAB — CMP (CANCER CENTER ONLY)
ALK PHOS: 69 U/L (ref 26–84)
ALT: 17 U/L (ref 10–47)
AST: 24 U/L (ref 11–38)
Albumin: 3.5 g/dL (ref 3.3–5.5)
BILIRUBIN TOTAL: 0.8 mg/dL (ref 0.20–1.60)
BUN: 7 mg/dL (ref 7–22)
CO2: 32 mEq/L (ref 18–33)
CREATININE: 0.8 mg/dL (ref 0.6–1.2)
Calcium: 9.5 mg/dL (ref 8.0–10.3)
Chloride: 95 mEq/L — ABNORMAL LOW (ref 98–108)
Glucose, Bld: 108 mg/dL (ref 73–118)
POTASSIUM: 3.5 meq/L (ref 3.3–4.7)
SODIUM: 132 meq/L (ref 128–145)
Total Protein: 6.1 g/dL — ABNORMAL LOW (ref 6.4–8.1)

## 2016-08-22 LAB — CBC WITH DIFFERENTIAL (CANCER CENTER ONLY)
BASO#: 0 10*3/uL (ref 0.0–0.2)
BASO%: 0.5 % (ref 0.0–2.0)
EOS%: 1.9 % (ref 0.0–7.0)
Eosinophils Absolute: 0.2 10*3/uL (ref 0.0–0.5)
HEMATOCRIT: 37.3 % — AB (ref 38.7–49.9)
HEMOGLOBIN: 13.1 g/dL (ref 13.0–17.1)
LYMPH#: 0.9 10*3/uL (ref 0.9–3.3)
LYMPH%: 10.5 % — ABNORMAL LOW (ref 14.0–48.0)
MCH: 32.8 pg (ref 28.0–33.4)
MCHC: 35.1 g/dL (ref 32.0–35.9)
MCV: 93 fL (ref 82–98)
MONO#: 0.8 10*3/uL (ref 0.1–0.9)
MONO%: 9.4 % (ref 0.0–13.0)
NEUT%: 77.7 % (ref 40.0–80.0)
NEUTROS ABS: 6.5 10*3/uL (ref 1.5–6.5)
Platelets: 167 10*3/uL (ref 145–400)
RBC: 4 10*6/uL — AB (ref 4.20–5.70)
RDW: 13.5 % (ref 11.1–15.7)
WBC: 8.3 10*3/uL (ref 4.0–10.0)

## 2016-08-22 MED ORDER — SODIUM CHLORIDE 0.9% FLUSH
10.0000 mL | INTRAVENOUS | Status: DC | PRN
  Administered 2016-08-22: 10 mL via INTRAVENOUS
  Filled 2016-08-22: qty 10

## 2016-08-22 MED ORDER — HEPARIN SOD (PORK) LOCK FLUSH 100 UNIT/ML IV SOLN
500.0000 [IU] | Freq: Once | INTRAVENOUS | Status: AC
  Administered 2016-08-22: 500 [IU] via INTRAVENOUS
  Filled 2016-08-22: qty 5

## 2016-08-22 NOTE — Progress Notes (Signed)
Hematology and Oncology Follow Up Visit  Bradley Meadows [id number] 1940-08-02 76 y.o. 08/22/2016   Principle Diagnosis:   Locally Advanced - Stage III - NSCLC - SCCa of the RIGHT lung  Current Therapy:    S/P cycle #2 CDDP/VP-16  Durvalumab -Q2 week dosing-to start on 10/01/2016       XRT to the medistinal mass - 60 Gy in 30 fractions - 06/04/2016            done     Interim History:  Bradley Meadows is back for follow-up. He looks much better. He really has done a nice job. He got through his treatments incredibly well. He is responded as expected. A PET scan done on July 6, does not show any residual activity in the mediastinum. As such, I would have to say that he has had a very good response and now is in remission.  The PET scan showed a borderline lymph nodes in the right groin. This might be from his hernia.  He is having problems with inguinal hernias. His mostly is on the left side. He really needs to have this fixed. I think he can have this done now.  He is a better. He is gaining weight. His breathing is doing better.  He's had no problems with bowels or bladder.  He's had a little bit of a cough. Sometimes, the humidity during the summertime does cause him respiratory issues.  His hair is coming back quite nicely.  He is still quite hoarse.  Overall,  his performance status is probably ECOG 1.  Medications:  Current Outpatient Prescriptions:  .  ALPRAZolam (XANAX) 0.5 MG tablet, Take one tablet 30 minutes prior to scan. (Patient not taking: Reported on 05/06/2016), Disp: 1 tablet, Rfl: 0 .  aspirin 81 MG tablet, Take 1 tablet (81 mg total) by mouth daily., Disp: 1 tablet, Rfl: 3 .  atorvastatin (LIPITOR) 40 MG tablet, TAKE 1 TABLET(40 MG) BY MOUTH DAILY (Patient not taking: Reported on 07/15/2016), Disp: 90 tablet, Rfl: 1 .  CARAFATE 1 GM/10ML suspension, , Disp: , Rfl:  .  COMBIVENT RESPIMAT 20-100 MCG/ACT AERS respimat, INHALE 1 PUFF BY MOUTH EVERY 6 HOURS AS NEEDED FOR  WHEEZING, Disp: 4 g, Rfl: 0 .  fluconazole (DIFLUCAN) 40 MG/ML suspension, Take 5 mLs (200 mg total) by mouth daily., Disp: 140 mL, Rfl: 3 .  HYDROcodone-acetaminophen (NORCO/VICODIN) 5-325 MG tablet, Take 1 tablet by mouth 2 (two) times daily as needed for moderate pain., Disp: 90 tablet, Rfl: 0 .  Ipratropium-Albuterol (COMBIVENT RESPIMAT) 20-100 MCG/ACT AERS respimat, Inhale 2 puffs into the lungs every 6 (six) hours as needed for wheezing., Disp: , Rfl:  .  ipratropium-albuterol (DUONEB) 0.5-2.5 (3) MG/3ML SOLN, USE 1 VIAL VIA NEBULIZER EVERY 6 HOURS AS NEEDED FOR SHORTNESS OF BREATH OR WHEEZING (Patient not taking: Reported on 05/06/2016), Disp: 990 mL, Rfl: 0 .  lidocaine-prilocaine (EMLA) cream, Apply to port 1-2 hours before needle stick. Cover with plastic wrap. (Patient not taking: Reported on 05/06/2016), Disp: 30 g, Rfl: 0 .  metoprolol tartrate (LOPRESSOR) 25 MG tablet, TAKE 1 TABLET(25 MG) BY MOUTH TWICE DAILY, Disp: 180 tablet, Rfl: 0 .  Multiple Vitamin (MULTIVITAMIN) tablet, Take 1 tablet by mouth daily. Centrum Silver, Disp: , Rfl:  .  omeprazole (PRILOSEC) 20 MG capsule, TK ONE C PO  QD, Disp: , Rfl: 1 .  STIOLTO RESPIMAT 2.5-2.5 MCG/ACT AERS, INHALE 2 PUFFS BY MOUTH DAILY, Disp: 4 g, Rfl: 2 .  triamterene-hydrochlorothiazide (  MAXZIDE-25) 37.5-25 MG tablet, Take 1 tablet daily for the next 3 days for leg swelling and then once daily as needed for leg swelling, Disp: 30 tablet, Rfl: 3 No current facility-administered medications for this visit.   Facility-Administered Medications Ordered in Other Visits:  .  sodium chloride flush (NS) 0.9 % injection 10 mL, 10 mL, Intravenous, PRN, [place], Holli Humbles, NP, 10 mL at 08/22/16 1153  Allergies:  Allergies  Allergen Reactions  . No Known Allergies     Past Medical History, Surgical history, Social history, and Family History were reviewed and updated.  Review of Systems: As above  Physical Exam:  weight is 180 lb (81.6 kg). His  oral temperature is 98.7 F (37.1 C). His blood pressure is 128/71 and his pulse is 63. His respiration is 20 and oxygen saturation is 100%.   Wt Readings from Last 3 Encounters:  08/22/16 180 lb (81.6 kg)  07/15/16 173 lb (78.5 kg)  07/10/16 176 lb 9.6 oz (80.1 kg)     Well-developed and well-nourished white male. Head and neck exam shows obvious pharyngeal thrush. He has no adenopathy in the neck. There is no scleral icterus. Lungs are clear bilaterally. Cardiac exam regular rate and rhythm with a normal S1 and S2. There are no murmurs, rubs or bruits. Abdomen is soft area and he has good bowel sounds. There is no fluid wave. There is no palpable liver or spleen tip. Extremities shows no clubbing, cyanosis or edema.There is some nonpitting edema of the right leg. He has a negative Homans's sign. No obvious palpable cord is noted. Skin exam shows no rashes, ecchymoses or petechia. Neurological exam shows no focal neurological deficits.     Lab Results  Component Value Date   WBC 8.3 08/22/2016   HGB 13.1 08/22/2016   HCT 37.3 (L) 08/22/2016   MCV 93 08/22/2016   PLT 167 08/22/2016     Chemistry      Component Value Date/Time   NA 132 08/22/2016 1040   NA 128 (L) 04/25/2016 1555   K 3.5 08/22/2016 1040   K 4.5 04/25/2016 1555   CL 95 (L) 08/22/2016 1040   CO2 32 08/22/2016 1040   CO2 26 04/25/2016 1555   BUN 7 08/22/2016 1040   BUN 18.6 04/25/2016 1555   CREATININE 0.8 08/22/2016 1040   CREATININE 0.7 04/25/2016 1555      Component Value Date/Time   CALCIUM 9.5 08/22/2016 1040   CALCIUM 8.7 04/25/2016 1555   ALKPHOS 69 08/22/2016 1040   ALKPHOS 51 04/25/2016 1555   AST 24 08/22/2016 1040   AST 14 04/25/2016 1555   ALT 17 08/22/2016 1040   ALT 17 04/25/2016 1555   BILITOT 0.80 08/22/2016 1040   BILITOT 0.65 04/25/2016 1555         Impression and Plan: Bradley Meadows is A 76 year old white male. 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  This is not operable. 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  At this point, I think we should consider him for durvalumab. From the latest PACIFIC trial, the addition of durvalumab after chemoradiation therapy for locally advanced non-small cell lung cancer improves survival significantly. With the addition of durvalumab, the progression free survival is 17 months. Without, is 6 months.  Again I think that he would be a very good candidate for this.  O'Donohue and his daughter regarding this. I probably would get this started in  about a month.  He clearly needs to have this hernia repaired. It looks think he has bilateral inguinal hernias. This is worse on the left side.  We will call general surgery to see if they can see him next week.  He does have cardiac issues. I'm sure that he will need cardiac clearance.  According to the PACIFIC trial, the durvalumab is given for one year.  I answered all their questions. I reviewed the PET scan with him. I reviewed the lab work.  I went over the side effects of the durvalumab. I really think that the side effects should be quite tolerable.  I spent about 40 minutes with he and his daughter.  I will see him back in about 2 months. He will like to have his treatments at the [facility] since this is a lot closer to home for him. Whenever I see him back, I will treat him in our office. feeding tube.    Volanda Napoleon, MD 7/20/201812:13 PM

## 2016-08-23 ENCOUNTER — Other Ambulatory Visit: Payer: Self-pay | Admitting: Family Medicine

## 2016-08-29 ENCOUNTER — Encounter: Payer: Self-pay | Admitting: Cardiovascular Disease

## 2016-08-29 ENCOUNTER — Ambulatory Visit: Payer: Self-pay | Admitting: General Surgery

## 2016-08-29 DIAGNOSIS — K4090 Unilateral inguinal hernia, without obstruction or gangrene, not specified as recurrent: Secondary | ICD-10-CM | POA: Diagnosis not present

## 2016-08-29 NOTE — H&P (Signed)
Bradley Meadows 08/29/2016 8:34 AM Location: [facility] Patient #: [id number] DOB: 08/15/1940 Single / Language: Cleophus Molt / Race: White Male  History of Present Illness [name] MD; 08/29/2016 9:19 AM) The patient is a 76 year old male.   Note:He is referred by Dr. Marin Olp for consultation regarding a large symptomatic left inguinal hernia. He is chest finished chemotherapy and radiation for lung cancer. They want to start him on immunotherapy. He has a large left inguinal hernia that he has to manually reduce at times. No obstruction symptoms. There is been there for a long time. It is becoming more symptomatic. He denies difficulty with urination or chronic constipation. He's had previous abdominal surgery for ulcer disease and then an abdominal hernia repair in the past. He states he is still weak from his chemotherapy and radiation therapy but is slowly getting stronger. Dr. Marin Olp wants to start immunotherapy in the near future.  He reports that the left groin area is flat when he gets up in the morning.  His daughter is here with him.  Past Surgical History (April Staton, CMA; 08/29/2016 8:34 AM) Cataract Surgery Bilateral. Knee Surgery Bilateral. Open Inguinal Hernia Surgery Left.  Diagnostic Studies History (April Staton, [place]; 08/29/2016 8:34 AM) Colonoscopy never  Allergies (April Staton, [place]; 08/29/2016 8:36 AM) No Known Drug Allergies 08/29/2016  Medication History (April Staton, CMA; 08/29/2016 8:42 AM) ALPRAZolam (0.5MG  Tablet, Oral) Active. Aspirin (81MG  Tablet, Oral) Active. Atorvastatin Calcium (40MG  Tablet, Oral) Active. Carafate (1GM/10ML Suspension, Oral) Active. Combivent Respimat (20-100MCG/ACT Aerosol Soln, Inhalation) Active. Fluconazole (40MG /ML For Suspension, Oral) Active. Hydrocodone-Acetaminophen (5-325MG  Tablet, Oral) Active. Lidocaine-Prilocaine (2.5-2.5% Cream, External) Active. Metoprolol Tartrate (25MG  Tablet,  Oral) Active. Multiple Vitamin (Oral) Active. Omeprazole (20MG  Capsule DR, Oral) Active. Stiolto Respimat (2.5-2.5MCG/ACT [employer], Inhalation) Active. Triamterene-HCTZ (37.5-25MG  Tablet, Oral) Active. Medications Reconciled  Social History (April Staton, CMA; 08/29/2016 8:35 AM) Alcohol use Remotely quit alcohol use. Caffeine use Carbonated beverages, Coffee, Tea. No drug use Tobacco use Former smoker.  Family History (April Staton, [place]; 08/29/2016 8:34 AM) Alcohol Abuse Father. Arthritis Mother. Breast Cancer Sister. Respiratory Condition Mother.  Other Problems (April Staton, CMA; 08/29/2016 8:35 AM) Anxiety Disorder Arthritis Chronic Obstructive Lung Disease Emphysema Of Lung Gastric Ulcer Gastroesophageal Reflux Disease High blood pressure Home Oxygen Use Hypercholesterolemia Inguinal Hernia Lung Cancer Myocardial infarction     Review of Systems (April Staton CMA; 08/29/2016 8:35 AM) General Present- Fatigue. Not Present- Appetite Loss, Chills, Fever, Night Sweats, Weight Gain and Weight Loss. HEENT Present- Hoarseness and Wears glasses/contact lenses. Not Present- Earache, Hearing Loss, Nose Bleed, Oral Ulcers, Ringing in the Ears, Seasonal Allergies, Sinus Pain, Sore Throat, Visual Disturbances and Yellow Eyes. Respiratory Present- Chronic Cough, Difficulty Breathing and Wheezing. Not Present- Bloody sputum and Snoring. Cardiovascular Present- Rapid Heart Rate, Shortness of Breath and Swelling of Extremities. Not Present- Chest Pain, Difficulty Breathing Lying Down, Leg Cramps and Palpitations. Gastrointestinal Present- Abdominal Pain, Bloating, Difficulty Swallowing and Indigestion. Not Present- Bloody Stool, Change in Bowel Habits, Chronic diarrhea, Constipation, Excessive gas, Gets full quickly at meals, Hemorrhoids, Nausea, Rectal Pain and Vomiting. Musculoskeletal Present- Back Pain, Joint Pain, Joint Stiffness and Swelling of  Extremities. Not Present- Muscle Pain and Muscle Weakness. Psychiatric Present- Anxiety. Not Present- Bipolar, Change in Sleep Pattern, Depression, Fearful and Frequent crying.  Vitals (April Staton CMA; 08/29/2016 8:42 AM) 08/29/2016 8:42 AM Weight: 175.5 lb Height: 67in Body Surface Area: 1.91 m Body Mass Index: 27.49 kg/m  Temp.: 98.77F(Oral)  Pulse: 67 (Regular)  BP:  120/58 (Sitting, Left Arm, Standard)      Physical Exam [name] MD; 08/29/2016 9:15 AM)  The physical exam findings are as follows: Note:GENERAL APPEARANCE: WDWN in NAD. Pleasant and cooperative.  EARS, NOSE, MOUTH THROAT: [place]/AT external ears: no lesions or deformities external nose: no lesions or deformities hearing: grossly normal lips: moist, no deformities He is hoarse EYES external: conjunctiva, lids, sclerae normal pupils: equal, round glasses: no  CV ascultation: RRR, no murmur extremity edema: no extremity varicosities: yes  RESP/CHEST auscultation: breath sounds equal and clear respiratory effort: normal   GASTROINTESTINAL abdomen: Soft, non-tender, non-distended, no masses liver and spleen: not enlarged. hernia: Large left inguinal hernia radiating down to the scrotum that is partially reducible scar: Midline scar  GENITOURINARY scrotum: Left inguinal hernia waiting down to left scrotum penis: no lesions  MUSCULOSKELETAL station and gait: Slightly hunched over deformities: none instability: none   SKIN jaundice: none  NEUROLOGIC speech: hoarse  PSYCHIATRIC alertness and orientation: normal mood/affect/behavior: normal judgement and insight: normal    Assessment & Plan [name] MD; 08/29/2016 9:17 AM)  INGUINAL HERNIA OF LEFT SIDE WITHOUT OBSTRUCTION OR GANGRENE (K40.90) Impression: This is symptomatic and requires manual reduction by him. It extends down into his scrotum.  Plan: We talked about elective open hernia repair with mesh  using spinal anesthesia and supplemental nerve block. He would like me to speak with his cardiologist first to make sure it is okay from their standpoint to proceed with the surgery. We will do so.  I have explained the procedure, risks, and aftercare of inguinal hernia repair. Risks include but are not limited to bleeding, infection, wound problems, anesthesia, recurrence, bladder or intestine injury, urinary retention, testicular dysfunction, chronic pain, mesh problems. He seems to understand and agrees with the plan.  Jackolyn Confer, M.D.

## 2016-09-14 ENCOUNTER — Other Ambulatory Visit: Payer: Self-pay | Admitting: Family Medicine

## 2016-09-14 DIAGNOSIS — I10 Essential (primary) hypertension: Secondary | ICD-10-CM

## 2016-09-16 NOTE — Patient Instructions (Addendum)
Bradley Meadows  09/16/2016   Your procedure is scheduled on: 09-26-16  Report to [facility] Main  Entrance Take [place]  Elevators to 3rd floor to  [place] at 5:30 AM.    Call this number if you have problems the morning of surgery [phone]    Remember: ONLY 1 PERSON MAY GO WITH YOU TO SHORT STAY TO GET  READY MORNING OF [employer].  Do not eat food or drink liquids :After Midnight.     Take these medicines the morning of surgery with A SIP OF WATER: Atorvastatin (Lipitor), Metoprolol Tartrate (Lopressor), Omeprazole (Prilosec). You may  also bring and use your inhaler                                You may not have any metal on your body including hair pins and              piercings  Do not wear jewelry, make-up, lotions, powders or perfumes, deodorant             Men may shave face and neck.   Do not bring valuables to the hospital. [place].  Contacts, dentures or bridgework may not be worn into surgery.     Patients discharged the day of surgery will not be allowed to drive home.  Name and phone number of your driver: [name] [phone]               Please read over the following fact sheets you were given: _____________________________________________________________________             [facility] - Preparing for Surgery Before surgery, you can play an important role.  Because skin is not sterile, your skin needs to be as free of germs as possible.  You can reduce the number of germs on your skin by washing with CHG (chlorahexidine gluconate) soap before surgery.  CHG is an antiseptic cleaner which kills germs and bonds with the skin to continue killing germs even after washing. Please DO NOT use if you have an allergy to CHG or antibacterial soaps.  If your skin becomes reddened/irritated stop using the CHG and inform your nurse when you arrive at Short Stay. Do not shave (including legs  and underarms) for at least 48 hours prior to the first CHG shower.  You may shave your face/neck. Please follow these instructions carefully:  1.  Shower with CHG Soap the night before surgery and the  morning of Surgery.  2.  If you choose to wash your hair, wash your hair first as usual with your  normal  shampoo.  3.  After you shampoo, rinse your hair and body thoroughly to remove the  shampoo.                           4.  Use CHG as you would any other liquid soap.  You can apply chg directly  to the skin and wash                       Gently with a scrungie or clean washcloth.  5.  Apply the CHG Soap to  your body ONLY FROM THE NECK DOWN.   Do not use on face/ open                           Wound or open sores. Avoid contact with eyes, ears mouth and genitals (private parts).                       Wash face,  Genitals (private parts) with your normal soap.             6.  Wash thoroughly, paying special attention to the area where your surgery  will be performed.  7.  Thoroughly rinse your body with warm water from the neck down.  8.  DO NOT shower/wash with your normal soap after using and rinsing off  the CHG Soap.                9.  Pat yourself dry with a clean towel.            10.  Wear clean pajamas.            11.  Place clean sheets on your bed the night of your first shower and do not  sleep with pets. Day of Surgery : Do not apply any lotions/deodorants the morning of surgery.  Please wear clean clothes to the hospital/surgery center.  FAILURE TO FOLLOW THESE INSTRUCTIONS MAY RESULT IN THE CANCELLATION OF YOUR SURGERY PATIENT SIGNATURE_________________________________  NURSE SIGNATURE__________________________________  ________________________________________________________________________

## 2016-09-16 NOTE — Progress Notes (Signed)
08-22-16 (EPIC) CBC w/Diff, CMP  (Cancer pt-will repeat labs at PAT)  02-15-16 (EPIC) EKG-SR w/PVC's  02-19-16  (EPIC) Stress Test -Low Risk EF 55-65%  08-23-15 (EPIC) ECHO 45-50% EF, no stenosis, no regurgitation

## 2016-09-17 ENCOUNTER — Encounter (HOSPITAL_COMMUNITY): Payer: Self-pay

## 2016-09-17 ENCOUNTER — Encounter (HOSPITAL_COMMUNITY)
Admission: RE | Admit: 2016-09-17 | Discharge: 2016-09-17 | Disposition: A | Discharge status: Home / Self Care | Payer: Medicare Other | Source: Ambulatory Visit | Attending: General Surgery | Admitting: General Surgery

## 2016-09-17 DIAGNOSIS — K4090 Unilateral inguinal hernia, without obstruction or gangrene, not specified as recurrent: Secondary | ICD-10-CM | POA: Insufficient documentation

## 2016-09-17 DIAGNOSIS — Z01818 Encounter for other preprocedural examination: Secondary | ICD-10-CM | POA: Insufficient documentation

## 2016-09-17 LAB — CBC WITH DIFFERENTIAL/PLATELET
BASOS ABS: 0 10*3/uL (ref 0.0–0.1)
BASOS PCT: 0 %
Eosinophils Absolute: 0.2 10*3/uL (ref 0.0–0.7)
Eosinophils Relative: 3 %
HEMATOCRIT: 37 % — AB (ref 39.0–52.0)
HEMOGLOBIN: 12.6 g/dL — AB (ref 13.0–17.0)
Lymphocytes Relative: 10 %
Lymphs Abs: 0.9 10*3/uL (ref 0.7–4.0)
MCH: 30.7 pg (ref 26.0–34.0)
MCHC: 34.1 g/dL (ref 30.0–36.0)
MCV: 90 fL (ref 78.0–100.0)
Monocytes Absolute: 0.9 10*3/uL (ref 0.1–1.0)
Monocytes Relative: 9 %
NEUTROS ABS: 7.5 10*3/uL (ref 1.7–7.7)
NEUTROS PCT: 78 %
Platelets: 184 10*3/uL (ref 150–400)
RBC: 4.11 MIL/uL — AB (ref 4.22–5.81)
RDW: 13.6 % (ref 11.5–15.5)
WBC: 9.6 10*3/uL (ref 4.0–10.5)

## 2016-09-17 LAB — COMPREHENSIVE METABOLIC PANEL
ALBUMIN: 3.7 g/dL (ref 3.5–5.0)
ALK PHOS: 67 U/L (ref 38–126)
ALT: 14 U/L — AB (ref 17–63)
AST: 19 U/L (ref 15–41)
Anion gap: 6 (ref 5–15)
BILIRUBIN TOTAL: 0.4 mg/dL (ref 0.3–1.2)
BUN: 13 mg/dL (ref 6–20)
CO2: 30 mmol/L (ref 22–32)
Calcium: 9.3 mg/dL (ref 8.9–10.3)
Chloride: 95 mmol/L — ABNORMAL LOW (ref 101–111)
Creatinine, Ser: 0.74 mg/dL (ref 0.61–1.24)
GFR calc Af Amer: >60 mL/min (ref >60)
GFR calc non Af Amer: >60 mL/min (ref >60)
GLUCOSE: 124 mg/dL — AB (ref 65–99)
POTASSIUM: 4.7 mmol/L (ref 3.5–5.1)
Sodium: 131 mmol/L — ABNORMAL LOW (ref 135–145)
TOTAL PROTEIN: 6.3 g/dL — AB (ref 6.5–8.1)

## 2016-09-25 NOTE — Anesthesia Preprocedure Evaluation (Addendum)
Anesthesia Evaluation  Patient identified by MRN, date of birth, ID band Patient awake    Reviewed: Allergy & Precautions, NPO status , Patient's Chart, lab work & pertinent test results  Airway Mallampati: II  TM Distance: >3 FB Neck ROM: Full    Dental  (+) Edentulous Upper, Edentulous Lower   Pulmonary asthma , COPD, former smoker,     + decreased breath sounds      Cardiovascular hypertension, + CAD and +CHF   Rhythm:Regular Rate:Normal     Neuro/Psych Depression    GI/Hepatic Neg liver ROS, GERD  ,  Endo/Other  negative endocrine ROS  Renal/GU negative Renal ROS     Musculoskeletal   Abdominal   Peds  Hematology   Anesthesia Other Findings Echo 7/17 The patient was in ventricular bigeminy, difficult to assess EF.   Estimate around 45-50%. Normal RV size and systolic function. No   significant valvular abnormalities.   Reproductive/Obstetrics                             Anesthesia Physical  Anesthesia Plan  ASA: IV  Anesthesia Plan: Spinal   Post-op Pain Management:  [facility]ional for Post-op pain   Induction:   PONV Risk Score and Plan: 2 and Ondansetron, Dexamethasone, Treatment may vary due to age or medical condition, Midazolam and Propofol infusion  Airway Management Planned: Nasal Cannula and Natural Airway  Additional Equipment:   Intra-op Plan:   Post-operative Plan: Possible Post-op intubation/ventilation  Informed Consent: I have reviewed the patients History and Physical, chart, labs and discussed the procedure including the risks, benefits and alternatives for the proposed anesthesia with the patient or authorized representative who has indicated his/her understanding and acceptance.   Dental advisory given  Plan Discussed with: CRNA, Anesthesiologist and Surgeon  Anesthesia Plan Comments: (  )       Anesthesia Quick Evaluation

## 2016-09-26 ENCOUNTER — Ambulatory Visit (HOSPITAL_COMMUNITY): Payer: Medicare Other | Admitting: Anesthesiology

## 2016-09-26 ENCOUNTER — Ambulatory Visit (HOSPITAL_COMMUNITY)
Admission: RE | Admit: 2016-09-26 | Discharge: 2016-09-26 | Disposition: A | Discharge status: Home / Self Care | Payer: Medicare Other | Source: Ambulatory Visit | Attending: General Surgery | Admitting: General Surgery

## 2016-09-26 ENCOUNTER — Encounter (HOSPITAL_COMMUNITY): Payer: Self-pay | Admitting: *Deleted

## 2016-09-26 ENCOUNTER — Encounter (HOSPITAL_COMMUNITY)
Admission: RE | Disposition: A | Discharge status: Home / Self Care | Payer: Self-pay | Source: Ambulatory Visit | Attending: General Surgery

## 2016-09-26 DIAGNOSIS — Z7951 Long term (current) use of inhaled steroids: Secondary | ICD-10-CM | POA: Insufficient documentation

## 2016-09-26 DIAGNOSIS — Z79899 Other long term (current) drug therapy: Secondary | ICD-10-CM | POA: Insufficient documentation

## 2016-09-26 DIAGNOSIS — I509 Heart failure, unspecified: Secondary | ICD-10-CM | POA: Insufficient documentation

## 2016-09-26 DIAGNOSIS — Z9981 Dependence on supplemental oxygen: Secondary | ICD-10-CM | POA: Diagnosis not present

## 2016-09-26 DIAGNOSIS — E7800 Pure hypercholesterolemia, unspecified: Secondary | ICD-10-CM | POA: Diagnosis not present

## 2016-09-26 DIAGNOSIS — I110 Hypertensive heart disease with heart failure: Secondary | ICD-10-CM | POA: Insufficient documentation

## 2016-09-26 DIAGNOSIS — K4090 Unilateral inguinal hernia, without obstruction or gangrene, not specified as recurrent: Secondary | ICD-10-CM | POA: Diagnosis not present

## 2016-09-26 DIAGNOSIS — M1990 Unspecified osteoarthritis, unspecified site: Secondary | ICD-10-CM | POA: Insufficient documentation

## 2016-09-26 DIAGNOSIS — Z9841 Cataract extraction status, right eye: Secondary | ICD-10-CM | POA: Insufficient documentation

## 2016-09-26 DIAGNOSIS — Z923 Personal history of irradiation: Secondary | ICD-10-CM | POA: Diagnosis not present

## 2016-09-26 DIAGNOSIS — I2510 Atherosclerotic heart disease of native coronary artery without angina pectoris: Secondary | ICD-10-CM | POA: Insufficient documentation

## 2016-09-26 DIAGNOSIS — I252 Old myocardial infarction: Secondary | ICD-10-CM | POA: Diagnosis not present

## 2016-09-26 DIAGNOSIS — J449 Chronic obstructive pulmonary disease, unspecified: Secondary | ICD-10-CM | POA: Insufficient documentation

## 2016-09-26 DIAGNOSIS — F419 Anxiety disorder, unspecified: Secondary | ICD-10-CM | POA: Diagnosis not present

## 2016-09-26 DIAGNOSIS — Z9842 Cataract extraction status, left eye: Secondary | ICD-10-CM | POA: Diagnosis not present

## 2016-09-26 DIAGNOSIS — G8918 Other acute postprocedural pain: Secondary | ICD-10-CM | POA: Diagnosis not present

## 2016-09-26 DIAGNOSIS — Z7982 Long term (current) use of aspirin: Secondary | ICD-10-CM | POA: Diagnosis not present

## 2016-09-26 DIAGNOSIS — Z9889 Other specified postprocedural states: Secondary | ICD-10-CM | POA: Insufficient documentation

## 2016-09-26 DIAGNOSIS — K219 Gastro-esophageal reflux disease without esophagitis: Secondary | ICD-10-CM | POA: Diagnosis not present

## 2016-09-26 DIAGNOSIS — Z9221 Personal history of antineoplastic chemotherapy: Secondary | ICD-10-CM | POA: Diagnosis not present

## 2016-09-26 DIAGNOSIS — I5022 Chronic systolic (congestive) heart failure: Secondary | ICD-10-CM | POA: Diagnosis not present

## 2016-09-26 DIAGNOSIS — C3490 Malignant neoplasm of unspecified part of unspecified bronchus or lung: Secondary | ICD-10-CM | POA: Insufficient documentation

## 2016-09-26 HISTORY — PX: INGUINAL HERNIA REPAIR: SHX194

## 2016-09-26 SURGERY — REPAIR, HERNIA, INGUINAL, ADULT
Anesthesia: Spinal | Laterality: Left

## 2016-09-26 MED ORDER — BUPIVACAINE IN DEXTROSE 0.75-8.25 % IT SOLN
INTRATHECAL | Status: DC | PRN
  Administered 2016-09-26: 12 mg via INTRATHECAL

## 2016-09-26 MED ORDER — SODIUM CHLORIDE 0.9% FLUSH: 3.0000 mL | INTRAVENOUS | Status: DC | PRN

## 2016-09-26 MED ORDER — BUPIVACAINE-EPINEPHRINE 0.5% -1:200000 IJ SOLN
INTRAMUSCULAR | Status: DC | PRN
  Administered 2016-09-26: 16 mL

## 2016-09-26 MED ORDER — PROPOFOL 500 MG/50ML IV EMUL
INTRAVENOUS | Status: DC | PRN
  Administered 2016-09-26: 50 ug/kg/min via INTRAVENOUS

## 2016-09-26 MED ORDER — OXYCODONE HCL 5 MG PO TABS: 5.0000 mg | ORAL_TABLET | ORAL | Status: DC | PRN

## 2016-09-26 MED ORDER — CEFAZOLIN SODIUM-DEXTROSE 2-4 GM/100ML-% IV SOLN
2.0000 g | INTRAVENOUS | Status: AC
  Administered 2016-09-26: 2 g via INTRAVENOUS

## 2016-09-26 MED ORDER — ACETAMINOPHEN 650 MG RE SUPP
650.0000 mg | RECTAL | Status: DC | PRN
  Filled 2016-09-26: qty 1

## 2016-09-26 MED ORDER — FENTANYL CITRATE (PF) 100 MCG/2ML IJ SOLN
INTRAMUSCULAR | Status: DC | PRN
Start: 2016-09-26 — End: 2016-09-26
  Administered 2016-09-26: 50 ug via INTRAVENOUS
  Administered 2016-09-26: 100 ug via INTRAVENOUS

## 2016-09-26 MED ORDER — PROPOFOL 10 MG/ML IV BOLUS
INTRAVENOUS | Status: AC
  Filled 2016-09-26: qty 20

## 2016-09-26 MED ORDER — PHENYLEPHRINE HCL 10 MG/ML IJ SOLN
INTRAMUSCULAR | Status: DC | PRN
  Administered 2016-09-26 (×5): 80 ug via INTRAVENOUS

## 2016-09-26 MED ORDER — ACETAMINOPHEN 325 MG PO TABS
650.0000 mg | ORAL_TABLET | ORAL | Status: DC | PRN
  Administered 2016-09-26: 650 mg via ORAL
  Filled 2016-09-26: qty 2

## 2016-09-26 MED ORDER — CHLORHEXIDINE GLUCONATE CLOTH 2 % EX PADS: 6.0000 | MEDICATED_PAD | Freq: Once | CUTANEOUS | Status: DC

## 2016-09-26 MED ORDER — PROPOFOL 10 MG/ML IV BOLUS
INTRAVENOUS | Status: AC
Start: 2016-09-26 — End: ?
  Filled 2016-09-26: qty 60

## 2016-09-26 MED ORDER — MEPERIDINE HCL 50 MG/ML IJ SOLN: 6.2500 mg | INTRAMUSCULAR | Status: DC | PRN

## 2016-09-26 MED ORDER — DEXTROSE 5 % IV SOLN: INTRAVENOUS | Status: DC | PRN

## 2016-09-26 MED ORDER — FENTANYL CITRATE (PF) 100 MCG/2ML IJ SOLN: 25.0000 ug | INTRAMUSCULAR | Status: DC | PRN

## 2016-09-26 MED ORDER — LACTATED RINGERS IV SOLN
INTRAVENOUS | Status: DC | PRN
  Administered 2016-09-26 (×2): via INTRAVENOUS

## 2016-09-26 MED ORDER — 0.9 % SODIUM CHLORIDE (POUR BTL) OPTIME
TOPICAL | Status: DC | PRN
  Administered 2016-09-26: 1000 mL

## 2016-09-26 MED ORDER — OXYCODONE HCL 5 MG PO TABS: 5.0000 mg | ORAL_TABLET | ORAL | 0 refills | Status: DC | PRN

## 2016-09-26 MED ORDER — FENTANYL CITRATE (PF) 250 MCG/5ML IJ SOLN
INTRAMUSCULAR | Status: AC
  Filled 2016-09-26: qty 5

## 2016-09-26 MED ORDER — CEFAZOLIN SODIUM-DEXTROSE 2-4 GM/100ML-% IV SOLN
INTRAVENOUS | Status: AC
  Filled 2016-09-26: qty 100

## 2016-09-26 MED ORDER — EPHEDRINE SULFATE 50 MG/ML IJ SOLN
INTRAMUSCULAR | Status: DC | PRN
  Administered 2016-09-26 (×2): 10 mg via INTRAVENOUS

## 2016-09-26 MED ORDER — ROPIVACAINE HCL 5 MG/ML IJ SOLN
INTRAMUSCULAR | Status: DC | PRN
  Administered 2016-09-26: 30 mL via PERINEURAL

## 2016-09-26 MED ORDER — BUPIVACAINE-EPINEPHRINE (PF) 0.5% -1:200000 IJ SOLN
INTRAMUSCULAR | Status: AC
  Filled 2016-09-26: qty 30

## 2016-09-26 SURGICAL SUPPLY — 59 items
APL SKNCLS STERI-STRIP NONHPOA (GAUZE/BANDAGES/DRESSINGS)
APPLIER CLIP 5 13 M/L LIGAMAX5 (MISCELLANEOUS)
APR CLP MED LRG 5 ANG JAW (MISCELLANEOUS)
BENZOIN TINCTURE PRP APPL 2/3 (GAUZE/BANDAGES/DRESSINGS) ×2 IMPLANT
BLADE HEX COATED 2.75 (ELECTRODE) ×1 IMPLANT
BLADE SURG 15 STRL LF DISP TIS (BLADE) ×1 IMPLANT
BLADE SURG 15 STRL SS (BLADE)
CABLE HIGH FREQUENCY MONO STRZ (ELECTRODE) ×1 IMPLANT
CHLORAPREP W/TINT 26ML (MISCELLANEOUS) ×6 IMPLANT
CLIP APPLIE 5 13 M/L LIGAMAX5 (MISCELLANEOUS) IMPLANT
CLOSURE WOUND 1/2 X4 (GAUZE/BANDAGES/DRESSINGS)
DECANTER SPIKE VIAL GLASS SM (MISCELLANEOUS) ×4 IMPLANT
DISSECTOR BLUNT TIP ENDO 5MM (MISCELLANEOUS) ×1 IMPLANT
DRAIN PENROSE 18X1/2 LTX STRL (DRAIN) ×2 IMPLANT
DRAPE INCISE IOBAN 66X45 STRL (DRAPES) ×1 IMPLANT
DRAPE INCISE IOBAN 85X60 (DRAPES) ×2 IMPLANT
DRAPE LAPAROTOMY T 102X78X121 (DRAPES) ×2 IMPLANT
DRAPE UTILITY XL STRL (DRAPES) ×1 IMPLANT
DRSG TEGADERM 2-3/8X2-3/4 SM (GAUZE/BANDAGES/DRESSINGS) ×2 IMPLANT
DRSG TEGADERM 4X4.75 (GAUZE/BANDAGES/DRESSINGS) ×3 IMPLANT
DRSG TELFA 3X8 NADH (GAUZE/BANDAGES/DRESSINGS) IMPLANT
DRSG TELFA 4X5 ISLAND ADH (GAUZE/BANDAGES/DRESSINGS) ×2 IMPLANT
ELECT PENCIL ROCKER SW 15FT (MISCELLANEOUS) ×3 IMPLANT
ELECT REM PT RETURN 15FT ADLT (MISCELLANEOUS) ×2 IMPLANT
GAUZE SPONGE 2X2 8PLY STRL LF (GAUZE/BANDAGES/DRESSINGS) ×1 IMPLANT
GAUZE SPONGE 4X4 12PLY STRL (GAUZE/BANDAGES/DRESSINGS) IMPLANT
GAUZE SPONGE 4X4 16PLY XRAY LF (GAUZE/BANDAGES/DRESSINGS) ×2 IMPLANT
GLOVE ECLIPSE 8.0 STRL XLNG CF (GLOVE) ×6 IMPLANT
GLOVE INDICATOR 8.0 STRL GRN (GLOVE) ×9 IMPLANT
GOWN STRL REUS W/TWL LRG LVL3 (GOWN DISPOSABLE) ×3 IMPLANT
GOWN STRL REUS W/TWL XL LVL3 (GOWN DISPOSABLE) ×7 IMPLANT
KIT BASIN OR (CUSTOM PROCEDURE TRAY) ×4 IMPLANT
MESH HERNIA 6X6 BARD (Mesh General) IMPLANT
MESH HERNIA BARD 6X6 (Mesh General) ×2 IMPLANT
NDL HYPO 25X1 1.5 SAFETY (NEEDLE) ×1 IMPLANT
NDL INSUFFLATION 14GA 120MM (NEEDLE) IMPLANT
NEEDLE HYPO 25X1 1.5 SAFETY (NEEDLE) ×3 IMPLANT
NEEDLE INSUFFLATION 14GA 120MM (NEEDLE) IMPLANT
PACK GENERAL/GYN (CUSTOM PROCEDURE TRAY) ×2 IMPLANT
PAD DRESSING TELFA 3X8 NADH (GAUZE/BANDAGES/DRESSINGS) IMPLANT
PUMP ON Q 100MLX2ML/HR (PAIN MANAGEMENT) IMPLANT
SCISSORS LAP 5X35 DISP (ENDOMECHANICALS) IMPLANT
SPONGE GAUZE 2X2 STER 10/PKG (GAUZE/BANDAGES/DRESSINGS) ×2
SPONGE LAP 4X18 X RAY DECT (DISPOSABLE) ×1 IMPLANT
STRIP CLOSURE SKIN 1/2X4 (GAUZE/BANDAGES/DRESSINGS) ×2 IMPLANT
SUT MNCRL AB 4-0 PS2 18 (SUTURE) ×5 IMPLANT
SUT PROLENE 2 0 CT2 30 (SUTURE) ×6 IMPLANT
SUT VIC AB 2-0 SH 18 (SUTURE) ×2 IMPLANT
SUT VIC AB 3-0 SH 27 (SUTURE)
SUT VIC AB 3-0 SH 27XBRD (SUTURE) ×2 IMPLANT
SYR BULB IRRIGATION 50ML (SYRINGE) ×1 IMPLANT
SYR CONTROL 10ML LL (SYRINGE) ×3 IMPLANT
TACKER 5MM HERNIA 3.5CML NAB (ENDOMECHANICALS) IMPLANT
TOWEL OR 17X26 10 PK STRL BLUE (TOWEL DISPOSABLE) ×6 IMPLANT
TOWEL OR NON WOVEN STRL DISP B (DISPOSABLE) ×6 IMPLANT
TRAY LAPAROSCOPIC (CUSTOM PROCEDURE TRAY) ×1 IMPLANT
TROCAR CANNULA W/PORT DUAL 5MM (MISCELLANEOUS) ×1 IMPLANT
TUBING INSUF HEATED (TUBING) IMPLANT
YANKAUER SUCT BULB TIP 10FT TU (MISCELLANEOUS) ×1 IMPLANT

## 2016-09-26 NOTE — Anesthesia Procedure Notes (Signed)
Spinal  Patient location during procedure: OR Start time: 09/26/2016 7:30 AM End time: 09/26/2016 7:40 AM Staffing Anesthesiologist: Joshalyn Ancheta Preanesthetic Checklist Completed: patient identified, site marked, surgical consent, pre-op evaluation, timeout performed, IV checked, risks and benefits discussed and monitors and equipment checked Spinal Block Patient position: sitting Prep: DuraPrep Patient monitoring: heart rate, cardiac monitor, continuous pulse ox and blood pressure Approach: midline Location: L4-5 Injection technique: single-shot Needle Needle type: Sprotte  Needle gauge: 22 G Needle length: 9 cm Assessment Sensory level: T4 Additional Notes First attempt at L4/5 with out success.  Move to L3/4 first pass +CSF / DOSE / + CSF ASP.

## 2016-09-26 NOTE — Discharge Instructions (Addendum)
Spinal Anesthesia and Epidural Anesthesia, Care After These instructions give you information about caring for yourself after your procedure. Your doctor may also give you more specific instructions. Call your doctor if you have any problems or questions after your procedure. Follow these instructions at home: For at least 24 hours after the procedure:   Do not: ? Do activities where you could fall or get hurt (injured). ? Drive. ? Use heavy machinery. ? Drink alcohol. ? Take sleeping pills or medicines that make you sleepy (drowsy). ? Make important decisions. ? Sign legal documents. ? Take care of children on your own.  Rest. Eating and drinking  If you throw up (vomit), drink water, juice, or soup when you can drink without throwing up.  Make sure you do not feel like throwing up (are not nauseous) before you eat.  Follow the diet that is recommended by your doctor. General instructions  Have a responsible adult stay with you until you are awake and alert.  Take over-the-counter and prescription medicines only as told by your doctor.  If you smoke, do not smoke unless an adult is watching.  Keep all follow-up visits as told by your doctor. This is important. Contact a doctor if:  It has been more than one day since your procedure and you feel like throwing up.  It has been more than one day since your procedure and you throw up.  You have a rash. Get help right away if:  You have a fever.  You have a headache that lasts a long time.  You have a very bad headache.  Your vision is blurry.  You see two of a single object (double vision).  You are dizzy or light-headed.  You faint.  Your arms or legs tingle, feel weak, or get numb.  You have trouble breathing.  You cannot pee (urinate). This information is not intended to replace advice given to you by your health care provider. Make sure you discuss any questions you have with your health care  provider. Document Released: 05/14/2015 Document Revised: 09/13/2015 Document Reviewed: 05/14/2015 Elsevier Interactive Patient Education  2018 [place] _______[facility]   INGUINAL HERNIA REPAIR: POST OP INSTRUCTIONS  Always review your discharge instruction sheet given to you by the facility where your surgery was performed. IF YOU HAVE DISABILITY OR FAMILY LEAVE FORMS, YOU MUST BRING THEM TO THE OFFICE FOR PROCESSING.   DO NOT GIVE THEM TO YOUR DOCTOR.  1. A  prescription for pain medication may be given to you upon discharge.  Take your pain medication as prescribed, if needed.  If narcotic pain medicine is not needed, then you may take acetaminophen (Tylenol) or ibuprofen (Advil) as needed. 2. Take your usually prescribed medications unless otherwise directed. 3. If you need a refill on your pain medication, please contact your pharmacy.  They will contact our office to request authorization. Prescriptions will not be filled after 5 pm or on week-ends. 4. You should follow a liquid diet the first 24 hours after arrival home, such as soup and crackers, etc.  Be sure to include lots of fluids daily.  Resume your normal diet the day after surgery. 5. Most patients will experience some swelling and bruising in the groin area (and scrotum in men).  Ice packs and reclining will help.  Swelling and bruising can take many days to resolve.  6. It is common to experience some constipation if taking pain medication after surgery.  Increasing fluid  intake and taking a stool softener (such as Colace) will usually help or prevent this problem from occurring.  A mild laxative (Milk of Magnesia or Miralax) should be taken according to package directions if there are no bowel movements after 48 hours. 7. Unless discharge instructions indicate otherwise, you may remove your bandages 4 days after surgery, and you may shower at that time.  You may have steri-strips (small skin tapes) in  place directly over the incision.  These strips should be left on the skin.  If your surgeon used skin glue on the incision, you may shower in 24 hours.  The glue will flake off over the next 2-3 weeks.  Any sutures or staples will be removed at the office during your follow-up visit. 8. ACTIVITIES:  You may resume regular (light) daily activities beginning the next day--such as daily self-care, walking, climbing stairs--gradually increasing activities as tolerated.  You may have sexual intercourse when it is comfortable.  Refrain from any heavy lifting or straining-nothing over 10 pounds for 6 weeks.  Do note lie flat for 2-3 days. a. You may drive when you are no longer taking prescription pain medication, you can comfortably wear a seatbelt, and you can safely maneuver your car and apply brakes. b. RETURN TO WORK:  Desk work/Light work in 1-2 weeks, full duty in 6 weeks._________________________________________________________ 9. You should see your doctor in the office for a follow-up appointment approximately 2-3 weeks after your surgery.  Make sure that you call for this appointment within a day or two after you arrive home to insure a convenient appointment time. 10. OTHER INSTRUCTIONS:  __Restart Aspirin 8/27/18________________________________________________________________________________________________________________________________________________________________________________________  WHEN TO CALL YOUR DOCTOR: 1. Fever over 101.0 2. Inability to urinate 3. Nausea and/or vomiting 4. Extreme swelling or bruising 5. Continued bleeding from incision. 6. Increased pain, redness, or drainage from the incision  The clinic staff is available to answer your questions during regular business hours.  Please dont hesitate to call and ask to speak to one of the nurses for clinical concerns.  If you have a medical emergency, go to the nearest emergency room or call 911.  A surgeon from [facility] is always on call at the hospital   [address], [place], [place], [place]  [address] ?  [address], [place], [place]   [address] [phone] ? [phone] ? FAX [phone] Web site: [email or web address]

## 2016-09-26 NOTE — H&P (View-Only) (Signed)
Bradley Meadows 08/29/2016 8:34 AM Location: [facility] Patient #: [id number] DOB: 05/14/1940 Single / Language: Bradley Meadows / Race: White Male  History of Present Illness Bradley Meadows; 08/29/2016 9:19 AM) The patient is a 76 year old male.   Note:He is referred by Bradley Meadows for consultation regarding a large symptomatic left inguinal hernia. He is chest finished chemotherapy and radiation for lung cancer. They want to start him on immunotherapy. He has a large left inguinal hernia that he has to manually reduce at times. No obstruction symptoms. There is been there for a long time. It is becoming more symptomatic. He denies difficulty with urination or chronic constipation. He's had previous abdominal surgery for ulcer disease and then an abdominal hernia repair in the past. He states he is still weak from his chemotherapy and radiation therapy but is slowly getting stronger. Bradley Meadows wants to start immunotherapy in the near future.  He reports that the left groin area is flat when he gets up in the morning.  His daughter is here with him.  Past Surgical History (Bradley Meadows, CMA; 08/29/2016 8:34 AM) Cataract Surgery Bilateral. Knee Surgery Bilateral. Open Inguinal Hernia Surgery Left.  Diagnostic Studies History (Bradley Meadows, Bradley Meadows; 08/29/2016 8:34 AM) Colonoscopy never  Allergies (Bradley Meadows, Bradley Meadows; 08/29/2016 8:36 AM) No Known Drug Allergies 08/29/2016  Medication History (Bradley Meadows, CMA; 08/29/2016 8:42 AM) ALPRAZolam (0.5MG  Tablet, Oral) Active. Aspirin (81MG  Tablet, Oral) Active. Atorvastatin Calcium (40MG  Tablet, Oral) Active. Carafate (1GM/10ML Suspension, Oral) Active. Combivent Respimat (20-100MCG/ACT Aerosol Soln, Inhalation) Active. Fluconazole (40MG /ML For Suspension, Oral) Active. Hydrocodone-Acetaminophen (5-325MG  Tablet, Oral) Active. Lidocaine-Prilocaine (2.5-2.5% Cream, External) Active. Metoprolol Tartrate (25MG  Tablet,  Oral) Active. Multiple Vitamin (Oral) Active. Omeprazole (20MG  Capsule DR, Oral) Active. Stiolto Respimat (2.5-2.5MCG/ACT [employer], Inhalation) Active. Triamterene-HCTZ (37.5-25MG  Tablet, Oral) Active. Medications Reconciled  Social History (Bradley Meadows, CMA; 08/29/2016 8:35 AM) Alcohol use Remotely quit alcohol use. Caffeine use Carbonated beverages, Coffee, Tea. No drug use Tobacco use Former smoker.  Family History (Bradley Meadows, Bradley Meadows; 08/29/2016 8:34 AM) Alcohol Abuse Father. Arthritis Mother. Breast Cancer Sister. Respiratory Condition Mother.  Other Problems (Bradley Meadows, CMA; 08/29/2016 8:35 AM) Anxiety Disorder Arthritis Chronic Obstructive Lung Disease Emphysema Of Lung Gastric Ulcer Gastroesophageal Reflux Disease High blood pressure Home Oxygen Use Hypercholesterolemia Inguinal Hernia Lung Cancer Myocardial infarction     Review of Systems (Bradley Meadows CMA; 08/29/2016 8:35 AM) General Present- Fatigue. Not Present- Appetite Loss, Chills, Fever, Night Sweats, Weight Gain and Weight Loss. HEENT Present- Hoarseness and Wears glasses/contact lenses. Not Present- Earache, Hearing Loss, Nose Bleed, Oral Ulcers, Ringing in the Ears, Seasonal Allergies, Sinus Pain, Sore Throat, Visual Disturbances and Yellow Eyes. Respiratory Present- Chronic Cough, Difficulty Breathing and Wheezing. Not Present- Bloody sputum and Snoring. Cardiovascular Present- Rapid Heart Rate, Shortness of Breath and Swelling of Extremities. Not Present- Chest Pain, Difficulty Breathing Lying Down, Leg Cramps and Palpitations. Gastrointestinal Present- Abdominal Pain, Bloating, Difficulty Swallowing and Indigestion. Not Present- Bloody Stool, Change in Bowel Habits, Chronic diarrhea, Constipation, Excessive gas, Gets full quickly at meals, Hemorrhoids, Nausea, Rectal Pain and Vomiting. Musculoskeletal Present- Back Pain, Joint Pain, Joint Stiffness and Swelling of  Extremities. Not Present- Muscle Pain and Muscle Weakness. Psychiatric Present- Anxiety. Not Present- Bipolar, Change in Sleep Pattern, Depression, Fearful and Frequent crying.  Vitals (Bradley Meadows CMA; 08/29/2016 8:42 AM) 08/29/2016 8:42 AM Weight: 175.5 lb Height: 67in Body Surface Area: 1.91 m Body Mass Index: 27.49 kg/m  Temp.: 98.84F(Oral)  Pulse: 67 (Regular)  BP:  120/58 (Sitting, Left Arm, Standard)      Physical Exam Bradley Meadows; 08/29/2016 9:15 AM)  The physical exam findings are as follows: Note:GENERAL APPEARANCE: WDWN in NAD. Pleasant and cooperative.  EARS, NOSE, MOUTH THROAT: [place]/AT external ears: no lesions or deformities external nose: no lesions or deformities hearing: grossly normal lips: moist, no deformities He is hoarse EYES external: conjunctiva, lids, sclerae normal pupils: equal, round glasses: no  CV ascultation: RRR, no murmur extremity edema: no extremity varicosities: yes  RESP/CHEST auscultation: breath sounds equal and clear respiratory effort: normal   GASTROINTESTINAL abdomen: Soft, non-tender, non-distended, no masses liver and spleen: not enlarged. hernia: Large left inguinal hernia radiating down to the scrotum that is partially reducible scar: Midline scar  GENITOURINARY scrotum: Left inguinal hernia waiting down to left scrotum penis: no lesions  MUSCULOSKELETAL station and gait: Slightly hunched over deformities: none instability: none   SKIN jaundice: none  NEUROLOGIC speech: hoarse  PSYCHIATRIC alertness and orientation: normal mood/affect/behavior: normal judgement and insight: normal    Assessment & Plan Bradley Meadows; 08/29/2016 9:17 AM)  INGUINAL HERNIA OF LEFT SIDE WITHOUT OBSTRUCTION OR GANGRENE (K40.90) Impression: This is symptomatic and requires manual reduction by him. It extends down into his scrotum.  Plan: We talked about elective open hernia repair with mesh  using spinal anesthesia and supplemental nerve block. He would like me to speak with his cardiologist first to make sure it is okay from their standpoint to proceed with the surgery. We will do so.  I have explained the procedure, risks, and aftercare of inguinal hernia repair. Risks include but are not limited to bleeding, infection, wound problems, anesthesia, recurrence, bladder or intestine injury, urinary retention, testicular dysfunction, chronic pain, mesh problems. He seems to understand and agrees with the plan.  Jackolyn Confer, M.D.

## 2016-09-26 NOTE — Transfer of Care (Signed)
Immediate Anesthesia Transfer of Care Note  Patient: Bradley Meadows  Procedure(s) Performed: Procedure(s): HERNIA REPAIR INGUINAL LEFT (Left)  Patient Location: PACU  Anesthesia Type:Spinal  Level of Consciousness: sedated, patient cooperative and responds to stimulation  Airway & Oxygen Therapy: Patient Spontanous Breathing and Patient connected to face mask oxygen  Post-op Assessment: Report given to RN and Post -op Vital signs reviewed and stable  Post vital signs: Reviewed and stable  Last Vitals:  Vitals:   09/26/16 0525  BP: (!) 127/59  Pulse: 68  Resp: 16  Temp: 37.1 C  SpO2: 99%    Last Pain:  Vitals:   09/26/16 0525  TempSrc: Oral      Patients Stated Pain Goal: 4 (16/01/09 3235)  Complications: No apparent anesthesia complications

## 2016-09-26 NOTE — Interval H&P Note (Signed)
History and Physical Interval Note:  09/26/2016 6:50 AM  Bradley Meadows  has presented today for surgery, with the diagnosis of left inguinal hernia.  His cardiologists feels it is safe from a cardiac standpoint to proceed. The various methods of treatment have been discussed with the patient and family. After consideration of risks, benefits and other options for treatment, the patient has consented to  Procedure(s) with comments:  LEFT INGUINAL HERNIA REPAIR WITH MESH (Left)  SPINAL/EPIDURAL TAP BLOCK INSERTION OF MESH (Left) - MAC SPINAL/EPIDURAL TAP BLOCK as a surgical intervention .  The patient's history has been reviewed, patient examined, no change in status, stable for surgery.  I have reviewed the patient's chart and labs.  Questions were answered to the patient's satisfaction.     Laisa Larrick Lenna Sciara

## 2016-09-26 NOTE — Anesthesia Procedure Notes (Signed)
Anesthesia Regional Block: TAP block   Pre-Anesthetic Checklist: ,, timeout performed, Correct Patient, Correct Site, Correct Laterality, Correct Procedure, Correct Position, site marked, Risks and benefits discussed,  Surgical consent,  Pre-op evaluation,  At surgeon's request and post-op pain management  Laterality: Left  Prep: chloraprep       Needles:  Injection technique: Single-shot  Needle Type: Echogenic Stimulator Needle     Needle Length: 5cm  Needle Gauge: 22     Additional Needles:   Procedures: ultrasound guided, nerve stimulator,,,,,,  Narrative:  Start time: 09/26/2016 7:12 AM End time: 09/26/2016 7:19 AM Injection made incrementally with aspirations every 5 mL.  Performed by: Personally  Anesthesiologist: Rufus Beske  Additional Notes: Functioning IV was confirmed and monitors were applied.  A 25mm 22ga Arrow echogenic stimulator needle was used. Sterile prep and drape,hand hygiene and sterile gloves were used. Ultrasound guidance: relevant anatomy identified, needle position confirmed, local anesthetic spread visualized around nerve(s)., vascular puncture avoided.  Image printed for medical record. Negative aspiration and negative test dose prior to incremental administration of local anesthetic. The patient tolerated the procedure well.

## 2016-09-26 NOTE — Op Note (Signed)
OPERATIVE NOTE-INGUINAL HERNIA REPAIR  Preoperative diagnosis:  Large (scrotal) left inguinal hernia.  Postoperative diagnosis:  Same  Procedure:  Left inguinal hernia repair with mesh.  Surgeon:  Jackolyn Confer, M.D.  Anesthesia:  Spinal, TAP block, local (Marcaine).  Indication: This is a 76 year old male with a large left scrotal inguinal hernia that is symptomatic.  He just completed some of his chemotherapy and radiation for lung cancer and needs more treatments.  He now presents for repair of the hernia.  EBL < 100 ml  Technique:  He was seen in the holding room and the left groin was marked with my initials. He was brought to the operating room and a spinal anesthetic was given.  Next, he was placed supine on the operating table. The hair in the groin area was clipped as was felt to be necessary. This area was then sterilely prepped and draped. A timeout was performed.  Local anesthetic was infiltrated in the superficial and deep tissues in the left groin.  An incision was made through the skin and subcutaneous tissue until the external oblique aponeurosis was identified.  Local anesthetic was infiltrated deep to the external oblique aponeurosis. The external oblique aponeurosis was divided through the external ring medially and back toward the anterior superior iliac spine laterally. Using blunt dissection, the shelving edge of the inguinal ligament was identified inferiorly and the internal oblique aponeurosis and muscle were identified superiorly.  A large hernia was noted and reduced out of the scrotum up into the groin wound.  The spermatic cord was isolated from the hernia sac and a posterior window was made around it. A [place] hernia  was present.  The hernia sac was separated from the spermatic cord using blunt dissection. The hernia sac and its contents were reduced through the hernia defect.   A piece of 6" x 6" polypropylene mesh was brought into the field, cut to 4" x 6",  and anchored 1-2 cm medial to the pubic tubercle with 2-0 Prolene suture. The inferior aspect of the mesh was anchored to the shelving edge of the inguinal ligament with running 2-0 Prolene suture to a level 1-2 cm lateral to the internal ring. A slit was cut in the mesh creating 2 tails. These were wrapped around the spermatic cord. The superior aspect of the mesh was anchored to the internal oblique aponeurosis and muscle with interrupted 2-0 Vicryl sutures. The 2 tails of the mesh were then crossed creating a new internal ring and were anchored to the shelving edge of the inguinal ligament with 2-0 Prolene suture.  The lateral aspect of the mesh was then tucked deep to the external oblique aponeurosis.  This provided good coverage with adequate overlap of the hernia defect.  The wound was inspected and hemostasis was adequate. The external oblique aponeurosis was then closed over the mesh and cord with running 3-0 Vicryl suture. The subcutaneous tissue was closed with running 3-0 Vicryl suture. The skin closed with a running 4-0 Monocryl subcuticular stitch.  Steri-Strips and a sterile dressing were applied.  The procedure was well-tolerated by him, without any apparent complications and he was taken to the recovery room in satisfactory condition.

## 2016-09-26 NOTE — Anesthesia Postprocedure Evaluation (Signed)
Anesthesia Post Note  Patient: Bradley Meadows  Procedure(s) Performed: Procedure(s) (LRB): HERNIA REPAIR INGUINAL LEFT (Left)     Patient location during evaluation: PACU Anesthesia Type: Spinal Level of consciousness: oriented and awake and alert Pain management: pain level controlled Vital Signs Assessment: post-procedure vital signs reviewed and stable Respiratory status: spontaneous breathing, respiratory function stable and patient connected to nasal cannula oxygen Cardiovascular status: blood pressure returned to baseline and stable Postop Assessment: no headache and no backache Anesthetic complications: no    Last Vitals:  Vitals:   09/26/16 0902 09/26/16 0915  BP: (!) 95/54 118/67  Pulse: 70 70  Resp: 14 (!) 22  Temp: 36.4 C   SpO2: 98% 100%    Last Pain:  Vitals:   09/26/16 0902  TempSrc:   PainSc: 0-No pain                 Anai Lipson

## 2016-09-29 ENCOUNTER — Encounter (HOSPITAL_COMMUNITY): Payer: Self-pay | Admitting: General Surgery

## 2016-10-05 ENCOUNTER — Other Ambulatory Visit: Payer: Self-pay | Admitting: Family Medicine

## 2016-10-10 ENCOUNTER — Other Ambulatory Visit: Payer: Self-pay | Admitting: Family Medicine

## 2016-10-13 ENCOUNTER — Other Ambulatory Visit: Payer: Self-pay | Admitting: Hematology & Oncology

## 2016-10-13 ENCOUNTER — Other Ambulatory Visit: Payer: Self-pay | Admitting: Family Medicine

## 2016-10-13 DIAGNOSIS — M7989 Other specified soft tissue disorders: Secondary | ICD-10-CM

## 2016-10-13 DIAGNOSIS — C3491 Malignant neoplasm of unspecified part of right bronchus or lung: Secondary | ICD-10-CM

## 2016-11-04 ENCOUNTER — Telehealth: Payer: Self-pay | Admitting: Oncology

## 2016-11-04 NOTE — Telephone Encounter (Signed)
Called patient's daughter to verify that he is still taking Carafate.  She said that he takes it occasionally for indigestion.

## 2016-11-14 ENCOUNTER — Ambulatory Visit: Payer: Medicare Other | Admitting: Hematology & Oncology

## 2016-11-14 ENCOUNTER — Other Ambulatory Visit: Payer: Medicare Other

## 2016-11-27 ENCOUNTER — Telehealth: Payer: Self-pay | Admitting: Family Medicine

## 2016-11-27 NOTE — Telephone Encounter (Signed)
ok 

## 2016-11-27 NOTE — Telephone Encounter (Signed)
Ok to refill 

## 2016-11-27 NOTE — Telephone Encounter (Signed)
Patient needs rx for his hydrocodone  [phone]

## 2016-11-28 MED ORDER — HYDROCODONE-ACETAMINOPHEN 5-325 MG PO TABS: 1.0000 | ORAL_TABLET | Freq: Two times a day (BID) | ORAL | 0 refills | Status: DC | PRN

## 2016-11-28 NOTE — Telephone Encounter (Signed)
RX printed, left up front and patient aware to pick up after 2 pm today

## 2016-12-01 ENCOUNTER — Encounter: Payer: Self-pay | Admitting: Family Medicine

## 2016-12-01 ENCOUNTER — Ambulatory Visit (INDEPENDENT_AMBULATORY_CARE_PROVIDER_SITE_OTHER): Payer: Medicare Other | Admitting: Family Medicine

## 2016-12-01 VITALS — BP 110/64 | HR 72 | Temp 98.2°F | Resp 20 | Ht 72.0 in | Wt 184.0 lb

## 2016-12-01 DIAGNOSIS — Z23 Encounter for immunization: Secondary | ICD-10-CM | POA: Diagnosis not present

## 2016-12-01 DIAGNOSIS — I2510 Atherosclerotic heart disease of native coronary artery without angina pectoris: Secondary | ICD-10-CM

## 2016-12-01 DIAGNOSIS — R42 Dizziness and giddiness: Secondary | ICD-10-CM

## 2016-12-01 NOTE — Progress Notes (Signed)
Subjective:    Patient ID: Bradley Meadows, male    DOB: 10-17-1940, 76 y.o.   MRN: [id number]  Medication Refill   Dizziness   08/14/15 Patient reports 2 weeks of worsening dyspnea on exertion. He is still taking stiolto daily and combivent bid.  However he sees very little benefit from his breathing medicines. He is smoking again. He is audibly wheezing today on exam. He has markedly diminished breath sounds bilaterally with rhonchorous breath sounds and expiratory wheezing. Heart was in normal sinus rhythm with frequent PVCs. His weight is down approximately 12 pounds since his last office visit. He does have trace pretibial edema bilaterally but not severe. There is no evidence of JVD or overt failure. Past medical history as documented above is significant for congestive heart failure but the patient does not appear fluid overloaded today on examination. He denies any chest pain or angina.  My plan was: Patient seems to be suffering from a COPD exacerbation. I will give the patient 60 mg of Depo-Medrol IM 1 now. Patient was also given DuoNeb 3 mg of albuterol and 0.5 mg of Atrovent 1 now. I want him to go immediately for a chest x-ray. I will obtain a CBC to evaluate for anemia. Given his weight loss and he also reports trace hemoptysis, I want him to go get a chest x-ray immediately. I will also repeat an echocardiogram of his heart. Ejection fraction was 45% 2 years ago but I will evaluate for worsening of his systolic function. Begin prednisone 60 mg a day in addition to Levaquin 500 mg a day and recheck in 48 hours or sooner if worse.  Combivent 2 puff QID.  Quit Smoking!!!!  08/16/15 Patient is doing substantially better. He denies any fever or chest pain. His cough has improved. He is coughing up less mucus. His wheezing has improved. Chest x-ray revealed chronic bronchitic changes but no evidence of pneumonia or CHF. Lab work revealed a normal BNP and no evidence of anemia.  At that time, my  plan was:  Clinically improving. Finish Levaquin and prednisone. Continue Combivent 4 times a day. Continue stiolto daily. Recheck in one week if not 100% better or sooner if worse  12/26/15 Since I last saw the patient, he has had an echocardiogram which revealed an ejection fraction of 45-50%. However it also showed diffuse hypokinesis. He never followed up with cardiology as planned for the stress test. He states that he is having worsening shortness of breath, worsening dyspnea on exertion. He quit taking stiolto.  He is currently using Combivent 4 times a day with no benefit. He is 93% on room air. With ambulation, his oxygen drops to 88% quickly. On 2 L via nasal cannula, his oxygen increased to 98% on room air.  At that time, my plan was:  Patient's dyspnea is multifactorial. The majority is due to COPD. The patient recently quit smoking. I instructed him to resume stiolto 2 inhalations daily. Begin DuoNeb nebs 2.5/0.5 mg nebs every 6 hours when necessary. I will arrange for the patient to have oxygen at home 2 L via nasal cannula. I will try to schedule follow-up with cardiologist for that the patient can have a stress test to evaluate for possible causes of his global hypokinesis on echocardiogram. Recheck next week. Start the patient on prednisone 40 mg daily given the severity of his COPD. Will discontinue prednisone in one week if his breathing is improving  01/01/16 Patient's breathing is noticeably better. He  is not wheezing as much as he was last week. His lung sounds are improved. His dyspnea on exertion has improved. He is now resting comfortably in bed at night on oxygen. Overall he feels much better however he feels extremely anxious and jittery on high-dose prednisone.  AT that time, my plan was: On examination, breath sounds have improved noticeably. Wheezing is much better although still present. Patient's respiratory status seems improved. Subjectively he feels better. He is resting  better at night since starting oxygen. He denies any chest pain. Discontinue prednisone. Continue Stiolto 2 inhalations daily. Use DuoNeb nebs 2.5/0.5 one inhalation every 6 hours as needed for wheezing. Continue oxygen at night and as needed with activity. Continue to refrain from smoking. Recheck in one month. Follow-up with cardiology as planned.  01/31/16 Patient is scheduled to see the heart doctor on the 12th. He continues to report dyspnea on exertion. He is audibly wheezing today in the exam room. He is wearing oxygen at night but not during the day. He is not wearing oxygen with activity. He is still being compliant with stiolto.  He uses Combivent or DuoNeb nebs every 6 hours as needed. Unfortunately, he reports hemoptysis. This has been occurring over the last 6 weeks. There is trace hemoptysis in his sputum. He also reports worsening hoarseness. After 20 seconds, his voice will simply "go out". He denies any chest pain or fevers.  At that time, my plan was: I believe his dyspnea on exertion is primarily due to COPD. He is scheduled to see the heart doctor to complete the workup for the hypokinesis found on his echocardiogram and to rule out ischemia as a potential cause of his dyspnea. I expect that this will likely confirm that his shortness of breath is due to his severe COPD.  Continue stiolto.  I recommended that he use oxygen with activity. We can consider putting him on a standing dose of prednisone if necessary for shortness of breath but will defer that at the present time. Right now I'm very concerned by his hemoptysis and hoarseness. I have recommended a CT scan of the lungs to evaluate for possible causes of hemoptysis. I will also schedule the patient to see ENT for laryngoscopy given his chronic hoarseness and hemoptysis to rule out a lesion on his vocal cords. Await the results of these referrals. Patient received his flu shot today.  07/15/16 Unfortunately, the workup at that time  revealed a mass near his esophagus. 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 PET scan shows possible metastasis to gastrohepatic ligament. 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 He has undergone 3 cycles of chemotherapy and received radiation treatments. He is scheduled for follow-up with his oncologist to repeat a PET scan. Surprisingly he has done extremely well. His cancer is nonoperable however his swallowing is much better after the chemotherapy and radiation treatments. For instance, yesterday he was able to eat eggs, ham sandwich, and a liver putting sandwich without difficulty. His strength is improving. His spirits seem upbeat. His shortness of breath is improving. He states that his breathing is approximately 80% improved compared to his last visit after apparent shrinking of the mass pressing on his trachea.  He continues to use hydrocodone as needed for joint pains and back pains. He is using 1 tablet daily as needed. He is requesting a refill on this. He is occasionally smoking and I counseled him not to do this. He is also  taking a protein supplement at least once a day with his meals.   At that time, my plan was:  Patient has multiple medical comorbidities. 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 That being said, he is doing remarkably well with treatment. His breathing is better. His swallowing is better. He denies any dysphagia. His strength is improving. All of this is reassuring. I reviewed his most recent lab work obtained in May which showed stable lab days regarding his CBC and CMP. I see no reason to repeat that at the present time. Currently he is asymptomatic from his congestive heart failure. Therefore I'll not add any other medications as he is still undergoing chemotherapy. His COPD is currently stable on his long-acting bronchodilators. I continue to encourage  the patient to avoid smoking. I refilled his pain medication and I gave him 90 tablets of that we will have to travel to the office once a month for medication.  12/01/16 Bradley Meadows is A 76 year old white male. 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 This is not operable. 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  At this point, I think we should consider him for durvalumab.  He is scheduled to see his oncologist next week to discuss this.  His biggest concern today is dizziness.  He states that he has had dizziness ever since he initiated treatment for his cancer.  During that time, he has lost approximately 16 pounds at least although he believes he is lost more than that.  He was taking metoprolol 25 mg p.o. twice daily in addition to Maxzide.  Dizziness is worse when he stands up quickly.  Although he also has some vertigo-like component to the dizziness and that he becomes dizzy if he turns his head quickly side to side.  The majority of his dizziness is generalized weakness and unsteadiness on his feet and orthostatic dizziness.    His medication list today is uncertain.  He is not taking oxycodone.  He is taking hydrocodone occasionally.  He is not using Xanax.  He is not taking Dramamine.  He is compliant with STIOLTO and uses Combivent as needed.  He is no longer smoking.  He uses his oxygen as needed particular with ambulation.  Today in the office, on room air, he is 93% at rest.  With ambulation he drops to 88% on room air.  He benefits with oxygen when he is up walking around so he does not become short of breath quickly.  He is due for his flu shot.   Past Medical History:  Diagnosis Date  . Arthritis   . Asthma   . CHF (congestive heart failure) (HCC)    ef=45-50%  . COPD (chronic obstructive pulmonary disease) ([place])    home O2  . Coronary artery disease    40-50% mLAD, 40% pRCA, 30% mRCA, occluded CX  with retrograde filling 06/01/06 cath  . GERD (gastroesophageal reflux disease)    ulcers  . Goals of care, counseling/discussion 04/07/2016  . History of external beam radiation therapy 04/24/16-06/04/16   chest 60 Gy in 30 fractions  . Hyperlipidemia   . Hypertension   . Lung cancer, primary, 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, right ([place]) 04/07/2016  . PVC's (premature ventricular contractions)    2017  . Squamous cell lung cancer, right ([place]) 04/07/2016   Past Surgical History:  Procedure Laterality  Date  . ESOPHAGOGASTRODUODENOSCOPY (EGD) WITH PROPOFOL N/A 02/26/2016   Procedure: ESOPHAGOGASTRODUODENOSCOPY (EGD) WITH PROPOFOL;  Surgeon: Irene Shipper, MD;  Location: [facility];  Service: Endoscopy;  Laterality: N/A;  . EXPLORATORY LAPAROTOMY     for ulcer  "washed me out with a hose pipe"  . EYE SURGERY     cataracts  . HERNIA REPAIR    . INGUINAL HERNIA REPAIR Left 09/26/2016   Procedure: HERNIA REPAIR INGUINAL LEFT;  Surgeon: Jackolyn Confer, MD;  Location: [facility];  Service: General;  Laterality: Left;  With MESH  . IR GENERIC HISTORICAL  04/10/2016   IR US GUIDE VASC ACCESS RIGHT 04/10/2016 Markus Daft, MD [facility]  . IR GENERIC HISTORICAL  04/10/2016   IR FLUORO GUIDE PORT INSERTION RIGHT 04/10/2016 Markus Daft, MD [facility]  . IR GENERIC HISTORICAL  04/10/2016   IR FLUORO RM 30-60 MIN 04/10/2016 Markus Daft, MD [facility]  . IR GENERIC HISTORICAL  04/18/2016   IR FLUORO RM 30-60 MIN 04/18/2016 Greggory Keen, MD [facility]  . JOINT REPLACEMENT     knee both  . PORTACATH PLACEMENT Right 04/10/2016  . VIDEO BRONCHOSCOPY WITH ENDOBRONCHIAL ULTRASOUND N/A 03/26/2016   Procedure: VIDEO BRONCHOSCOPY WITH ENDOBRONCHIAL ULTRASOUND;  Surgeon: Collene Gobble, MD;  Location: [facility];  Service: Thoracic;  Laterality: N/A;   Current Outpatient Prescriptions on File Prior to Visit  Medication Sig Dispense Refill  . atorvastatin (LIPITOR) 40 MG tablet TAKE 1 TABLET(40 MG) BY MOUTH DAILY 90  tablet 0  . CARAFATE 1 GM/10ML suspension Take 1 g by mouth 2 (two) times daily.     . COMBIVENT RESPIMAT 20-100 MCG/ACT AERS respimat INHALE 1 PUFF BY MOUTH EVERY 6 HOURS AS NEEDED FOR WHEEZING 4 g 0  . HYDROcodone-acetaminophen (NORCO/VICODIN) 5-325 MG tablet Take 1 tablet by mouth 2 (two) times daily as needed for moderate pain. 90 tablet 0  . ipratropium-albuterol (DUONEB) 0.5-2.5 (3) MG/3ML SOLN USE 1 VIAL VIA NEBULIZER EVERY 6 HOURS AS NEEDED FOR SHORTNESS OF BREATH OR WHEEZING 990 mL 0  . metoprolol tartrate (LOPRESSOR) 25 MG tablet TAKE 1 TABLET(25 MG) BY MOUTH TWICE DAILY 180 tablet 0  . Multiple Vitamin (MULTIVITAMIN) tablet Take 1 tablet by mouth daily. Centrum Silver    . omeprazole (PRILOSEC) 20 MG capsule take 1 capsule by mouth once daily  1  . STIOLTO RESPIMAT 2.5-2.5 MCG/ACT AERS INHALE 2 PUFFS BY MOUTH DAILY 4 g 5  . triamterene-hydrochlorothiazide (MAXZIDE-25) 37.5-25 MG tablet TAKE 1 TABLET BY MOUTH EVERY DAY FOR 3 DAYS AS NEEDED FOR LEG SWELLING 90 tablet 3  . oxyCODONE (OXY IR/ROXICODONE) 5 MG immediate release tablet Take 1-2 tablets (5-10 mg total) by mouth every 4 (four) hours as needed for moderate pain, severe pain or breakthrough pain. (Patient not taking: Reported on 12/01/2016) 30 tablet 0   No current facility-administered medications on file prior to visit.    Allergies  Allergen Reactions  . No Known Allergies    Social History   Social History  . Marital status: Divorced    Spouse name: N/A  . Number of children: 3  . Years of education: 9   Occupational History  . RetiredAnimal nutritionist Retired   Social History Main Topics  . Smoking status: Former Smoker    Packs/day: 1.50    Years: 30.00    Types: Cigarettes    Quit date: 09/29/2015  . Smokeless tobacco: Never Used  . Alcohol use No  . Drug use: No  .  Sexual activity: Not Currently   Other Topics Concern  . Not on file   Social History Narrative   Grew up in [place] area.    Left  school after 9th grade. No father and had to work.    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       Health Care POA:    Emergency Contact: sister, Bradley Meadows (h) [phone]   End of Life Plan:    Who lives with you: self   Any pets: Lorrin Goodell   Diet: Pt limits sugars and starches and focuses on vegetables an protein. Pt has lost 50 lbs over several years.   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   Seatbelts: Pt reports wearing seatbelt when in vehicle.   Sun Exposure/Protection: Pt does not use sun protectin.   Hobbies: Racing, TV, gardening, Architect            Review of Systems  Neurological: Positive for dizziness.  All other systems reviewed and are negative.      Objective:   Physical Exam  Constitutional: He is oriented to person, place, and time. He appears well-developed and well-nourished. No distress.  Neck: Neck supple. No JVD present. No thyromegaly present.  Cardiovascular: Normal rate, regular rhythm, normal heart sounds and intact distal pulses.   No murmur heard. Pulmonary/Chest: Effort normal. No respiratory distress. He has decreased breath sounds. He has wheezes. He has no rhonchi. He has no rales.  Abdominal: Soft. Bowel sounds are normal. He exhibits no distension. There is no tenderness. There is no rebound and no guarding.  Musculoskeletal: He exhibits edema.  Lymphadenopathy:    He has no cervical adenopathy.  Neurological: He is alert and oriented to person, place, and time. He has normal reflexes. No cranial nerve deficit. He exhibits normal muscle tone. Coordination normal.  Skin: He is not diaphoretic.  Vitals reviewed.   hoarse      Assessment & Plan:  Dizziness - Plan: CBC with Differential/Platelet, COMPLETE METABOLIC PANEL WITH GFR  Needs flu shot - Plan: Flu vaccine HIGH DOSE PF (Fluzone High dose), CANCELED: Flu Vaccine QUAD 36+ mos IM  Diagnosis is  likely multifactorial.  Some of his dizziness could be due to age.  Some of it could be due to hypotension.  Some of it could be due to generalized deconditioning after treatment for malignancy.  I would like the patient to decrease his metoprolol to 12.5 mg p.o. twice daily and then reassess in 2 weeks.  I will gradually wean him off that medication as tolerated to see if his dizziness would be improved.  Some of his dizziness sounds like vertigo.  If the dizziness is not improving, I would proceed with an MRI of the brain to evaluate for posterior circulation infarct although his neurologic exam today is completely normal.  He has a negative Dix-Hallpike maneuver.  There is no evidence of a sinus infection or an ear infection.  I will also check a CBC as well as a CMP to evaluate for anemia or electrolyte abnormalities.  He still requires oxygen 2 L via nasal cannula with ambulation.  Without oxygen, his pulse oximetry dropped to 88% with ambulation.

## 2016-12-02 LAB — CBC WITH DIFFERENTIAL/PLATELET
BASOS ABS: 51 {cells}/uL (ref 0–200)
Basophils Relative: 0.7 %
EOS PCT: 4.1 %
Eosinophils Absolute: 299 cells/uL (ref 15–500)
HEMATOCRIT: 36 % — AB (ref 38.5–50.0)
Hemoglobin: 12.4 g/dL — ABNORMAL LOW (ref 13.2–17.1)
LYMPHS ABS: 986 {cells}/uL (ref 850–3900)
MCH: 31.2 pg (ref 27.0–33.0)
MCHC: 34.4 g/dL (ref 32.0–36.0)
MCV: 90.7 fL (ref 80.0–100.0)
MPV: 10.9 fL (ref 7.5–12.5)
Monocytes Relative: 11.4 %
NEUTROS PCT: 70.3 %
Neutro Abs: 5132 cells/uL (ref 1500–7800)
Platelets: 172 10*3/uL (ref 140–400)
RBC: 3.97 10*6/uL — ABNORMAL LOW (ref 4.20–5.80)
RDW: 13.6 % (ref 11.0–15.0)
TOTAL LYMPHOCYTE: 13.5 %
WBC mixed population: 832 cells/uL (ref 200–950)
WBC: 7.3 10*3/uL (ref 3.8–10.8)

## 2016-12-02 LAB — COMPLETE METABOLIC PANEL WITH GFR
AG RATIO: 1.8 (calc) (ref 1.0–2.5)
ALKALINE PHOSPHATASE (APISO): 74 U/L (ref 40–115)
ALT: 10 U/L (ref 9–46)
AST: 12 U/L (ref 10–35)
Albumin: 3.6 g/dL (ref 3.6–5.1)
BILIRUBIN TOTAL: 0.5 mg/dL (ref 0.2–1.2)
BUN: 7 mg/dL (ref 7–25)
CHLORIDE: 95 mmol/L — AB (ref 98–110)
CO2: 26 mmol/L (ref 20–32)
Calcium: 8.9 mg/dL (ref 8.6–10.3)
Creat: 0.71 mg/dL (ref 0.70–1.18)
GFR, Est African American: 106 mL/min/{1.73_m2} (ref >=60)
GFR, Est Non African American: 91 mL/min/{1.73_m2} (ref >=60)
GLUCOSE: 97 mg/dL (ref 65–99)
Globulin: 2 g/dL (calc) (ref 1.9–3.7)
POTASSIUM: 3.9 mmol/L (ref 3.5–5.3)
Sodium: 129 mmol/L — ABNORMAL LOW (ref 135–146)
TOTAL PROTEIN: 5.6 g/dL — AB (ref 6.1–8.1)

## 2016-12-03 ENCOUNTER — Ambulatory Visit (HOSPITAL_BASED_OUTPATIENT_CLINIC_OR_DEPARTMENT_OTHER): Payer: Medicare Other | Admitting: Hematology & Oncology

## 2016-12-03 ENCOUNTER — Ambulatory Visit (HOSPITAL_BASED_OUTPATIENT_CLINIC_OR_DEPARTMENT_OTHER): Payer: Medicare Other

## 2016-12-03 ENCOUNTER — Telehealth: Payer: Self-pay | Admitting: Family Medicine

## 2016-12-03 ENCOUNTER — Other Ambulatory Visit (HOSPITAL_BASED_OUTPATIENT_CLINIC_OR_DEPARTMENT_OTHER): Payer: Medicare Other

## 2016-12-03 VITALS — BP 113/53 | HR 63 | Temp 97.8°F | Wt 178.0 lb

## 2016-12-03 DIAGNOSIS — C3491 Malignant neoplasm of unspecified part of right bronchus or lung: Secondary | ICD-10-CM

## 2016-12-03 DIAGNOSIS — C772 Secondary and unspecified malignant neoplasm of intra-abdominal lymph nodes: Secondary | ICD-10-CM | POA: Diagnosis not present

## 2016-12-03 DIAGNOSIS — Z79899 Other long term (current) drug therapy: Secondary | ICD-10-CM | POA: Diagnosis not present

## 2016-12-03 DIAGNOSIS — R5383 Other fatigue: Secondary | ICD-10-CM

## 2016-12-03 LAB — CBC WITH DIFFERENTIAL (CANCER CENTER ONLY)
BASO#: 0 10*3/uL (ref 0.0–0.2)
BASO%: 0.6 % (ref 0.0–2.0)
EOS ABS: 0.3 10*3/uL (ref 0.0–0.5)
EOS%: 4.1 % (ref 0.0–7.0)
HEMATOCRIT: 36.4 % — AB (ref 38.7–49.9)
HEMOGLOBIN: 12.7 g/dL — AB (ref 13.0–17.1)
LYMPH#: 0.9 10*3/uL (ref 0.9–3.3)
LYMPH%: 12.3 % — ABNORMAL LOW (ref 14.0–48.0)
MCH: 31.6 pg (ref 28.0–33.4)
MCHC: 34.9 g/dL (ref 32.0–35.9)
MCV: 91 fL (ref 82–98)
MONO#: 0.6 10*3/uL (ref 0.1–0.9)
MONO%: 9.1 % (ref 0.0–13.0)
NEUT%: 73.9 % (ref 40.0–80.0)
NEUTROS ABS: 5.1 10*3/uL (ref 1.5–6.5)
Platelets: 169 10*3/uL (ref 145–400)
RBC: 4.02 10*6/uL — AB (ref 4.20–5.70)
RDW: 14.3 % (ref 11.1–15.7)
WBC: 6.9 10*3/uL (ref 4.0–10.0)

## 2016-12-03 LAB — CMP (CANCER CENTER ONLY)
ALBUMIN: 3.6 g/dL (ref 3.3–5.5)
ALT(SGPT): 17 U/L (ref 10–47)
AST: 21 U/L (ref 11–38)
Alkaline Phosphatase: 79 U/L (ref 26–84)
BILIRUBIN TOTAL: 0.9 mg/dL (ref 0.20–1.60)
BUN, Bld: 8 mg/dL (ref 7–22)
CALCIUM: 9.1 mg/dL (ref 8.0–10.3)
CHLORIDE: 94 meq/L — AB (ref 98–108)
CO2: 27 mEq/L (ref 18–33)
CREATININE: 0.6 mg/dL (ref 0.6–1.2)
Glucose, Bld: 122 mg/dL — ABNORMAL HIGH (ref 73–118)
Potassium: 3.3 mEq/L (ref 3.3–4.7)
SODIUM: 135 meq/L (ref 128–145)
TOTAL PROTEIN: 6 g/dL — AB (ref 6.4–8.1)

## 2016-12-03 LAB — TSH: TSH: 1.337 m[IU]/L (ref 0.320–4.118)

## 2016-12-03 MED ORDER — HEPARIN SOD (PORK) LOCK FLUSH 100 UNIT/ML IV SOLN
500.0000 [IU] | Freq: Once | INTRAVENOUS | Status: AC
  Administered 2016-12-03: 500 [IU] via INTRAVENOUS
  Filled 2016-12-03: qty 5

## 2016-12-03 MED ORDER — SODIUM CHLORIDE 0.9% FLUSH
10.0000 mL | INTRAVENOUS | Status: DC | PRN
  Administered 2016-12-03: 10 mL via INTRAVENOUS
  Filled 2016-12-03: qty 10

## 2016-12-03 NOTE — Patient Instructions (Signed)
Implanted Port Insertion, Care After °This sheet gives you information about how to care for yourself after your procedure. Your health care provider may also give you more specific instructions. If you have problems or questions, contact your health care provider. °What can I expect after the procedure? °After your procedure, it is common to have: °· Discomfort at the port insertion site. °· Bruising on the skin over the port. This should improve over 3-4 days. ° °Follow these instructions at home: °Port care °· After your port is placed, you will get a manufacturer's information card. The card has information about your port. Keep this card with you at all times. °· Take care of the port as told by your health care provider. Ask your health care provider if you or a family member can get training for taking care of the port at home. A home health care nurse may also take care of the port. °· Make sure to remember what type of port you have. °Incision care °· Follow instructions from your health care provider about how to take care of your port insertion site. Make sure you: °? Wash your hands with soap and water before you change your bandage (dressing). If soap and water are not available, use hand sanitizer. °? Change your dressing as told by your health care provider. °? Leave stitches (sutures), skin glue, or adhesive strips in place. These skin closures may need to stay in place for 2 weeks or longer. If adhesive strip edges start to loosen and curl up, you may trim the loose edges. Do not remove adhesive strips completely unless your health care provider tells you to do that. °· Check your port insertion site every day for signs of infection. Check for: °? More redness, swelling, or pain. °? More fluid or blood. °? Warmth. °? Pus or a bad smell. °General instructions °· Do not take baths, swim, or use a hot tub until your health care provider approves. °· Do not lift anything that is heavier than 10 lb (4.5  kg) for a week, or as told by your health care provider. °· Ask your health care provider when it is okay to: °? Return to work or school. °? Resume usual physical activities or sports. °· Do not drive for 24 hours if you were given a medicine to help you relax (sedative). °· Take over-the-counter and prescription medicines only as told by your health care provider. °· Wear a medical alert bracelet in case of an emergency. This will tell any health care providers that you have a port. °· Keep all follow-up visits as told by your health care provider. This is important. °Contact a health care provider if: °· You cannot flush your port with saline as directed, or you cannot draw blood from the port. °· You have a fever or chills. °· You have more redness, swelling, or pain around your port insertion site. °· You have more fluid or blood coming from your port insertion site. °· Your port insertion site feels warm to the touch. °· You have pus or a bad smell coming from the port insertion site. °Get help right away if: °· You have chest pain or shortness of breath. °· You have bleeding from your port that you cannot control. °Summary °· Take care of the port as told by your health care provider. °· Change your dressing as told by your health care provider. °· Keep all follow-up visits as told by your health care provider. °  This information is not intended to replace advice given to you by your health care provider. Make sure you discuss any questions you have with your health care provider. °Document Released: 11/10/2012 Document Revised: 12/12/2015 Document Reviewed: 12/12/2015 °[employer] Interactive Patient Education © 2017 [employer] ° °

## 2016-12-03 NOTE — Telephone Encounter (Signed)
Pt's daughter called and states that pt is taking Lopressor 25mg  1/2 tab bid.

## 2016-12-03 NOTE — Progress Notes (Signed)
Hematology and Oncology Follow Up Visit  Bradley Meadows [id number] 04-Dec-1940 76 y.o. 12/03/2016   Principle Diagnosis:   Locally Advanced - Stage III - NSCLC - SCCa of the RIGHT lung  Current Therapy:    S/P cycle #2 CDDP/VP-16  Durvalumab -Q2 week dosing-to start on 12/05/2016       XRT to the medistinal mass - 60 Gy in 30 fractions - 06/04/2016            done     Interim History:  Bradley Meadows is back for follow-up. Unfortunately, he has not yet started immunotherapy for his lung cancer. I had him to start Durvalumab about 2 months ago. However, he would did not start this because he had inguinal hernia surgery. He was supposed have both inguinal hernias repaired. Unfortunately he only had one repaired. This was on the left side. He got through this. He's not sure when the right one will be taking care of.  We really have to get started on the immunotherapy portion of treatment. The PACIFIC Clinical trial clearly showed a significant benefit to [facility] after chemotherapy and radiation therapy. However, because of this hernia surgery, this was put off. We really have to get him started.  He is eating well. His hair has come back nicely.  He's had no pain. He's had no cough. He's had no headache. He's had no rashes. He's had no leg swelling.  Overall,  his performance status is probably ECOG 1.  Medications:  Current Outpatient Prescriptions:  .  atorvastatin (LIPITOR) 40 MG tablet, TAKE 1 TABLET(40 MG) BY MOUTH DAILY, Disp: 90 tablet, Rfl: 0 .  CARAFATE 1 GM/10ML suspension, Take 1 g by mouth 2 (two) times daily. , Disp: , Rfl:  .  COMBIVENT RESPIMAT 20-100 MCG/ACT AERS respimat, INHALE 1 PUFF BY MOUTH EVERY 6 HOURS AS NEEDED FOR WHEEZING, Disp: 4 g, Rfl: 0 .  HYDROcodone-acetaminophen (NORCO/VICODIN) 5-325 MG tablet, Take 1 tablet by mouth 2 (two) times daily as needed for moderate pain., Disp: 90 tablet, Rfl: 0 .  ipratropium-albuterol (DUONEB) 0.5-2.5 (3) MG/3ML SOLN, USE 1  VIAL VIA NEBULIZER EVERY 6 HOURS AS NEEDED FOR SHORTNESS OF BREATH OR WHEEZING, Disp: 990 mL, Rfl: 0 .  metoprolol tartrate (LOPRESSOR) 25 MG tablet, TAKE 1 TABLET(25 MG) BY MOUTH TWICE DAILY (Patient taking differently: TAKE 1 TABLET(25 MG) BY MOUTH once daily.  Half tablet in the am , Half tablet in the pm), Disp: 180 tablet, Rfl: 0 .  Multiple Vitamin (MULTIVITAMIN) tablet, Take 1 tablet by mouth daily. Centrum Silver, Disp: , Rfl:  .  omeprazole (PRILOSEC) 20 MG capsule, take 1 capsule by mouth once daily, Disp: , Rfl: 1 .  STIOLTO RESPIMAT 2.5-2.5 MCG/ACT AERS, INHALE 2 PUFFS BY MOUTH DAILY, Disp: 4 g, Rfl: 5 .  triamterene-hydrochlorothiazide (MAXZIDE-25) 37.5-25 MG tablet, Take 1 tablet by mouth every morning., Disp: , Rfl: 3  Allergies:  Allergies  Allergen Reactions  . No Known Allergies     Past Medical History, Surgical history, Social history, and Family History were reviewed and updated.  Review of Systems: As stated in the interim history  Physical Exam:  weight is 178 lb (80.7 kg). His oral temperature is 97.8 F (36.6 C). His blood pressure is 113/53 (abnormal) and his pulse is 63.   Wt Readings from Last 3 Encounters:  12/03/16 178 lb (80.7 kg)  12/01/16 184 lb (83.5 kg)  09/26/16 180 lb (81.6 kg)     Well-developed and well-nourished  white male. Head and neck exam shows no ocular or oral lesions. There are no palpable cervical or supraclavicular lymph nodes. Lungs are clear bilaterally. Cardiac exam regular rate and rhythm with no murmurs, rubs or bruits. Abdomen is soft. He has good bowel sounds. There is no fluid wave. There is no palpable liver or spleen tip. Back exam shows no tenderness over the spine, ribs or hips. Extremities shows no clubbing, cyanosis or edema. Has good range of motion of his joints. Skin exam shows no rashes, ecchymoses or petechia. Neurological exam shows no focal neurological deficits.    Lab Results  Component Value Date   WBC 6.9  12/03/2016   HGB 12.7 (L) 12/03/2016   HCT 36.4 (L) 12/03/2016   MCV 91 12/03/2016   PLT 169 12/03/2016     Chemistry      Component Value Date/Time   NA 135 12/03/2016 0757   NA 128 (L) 04/25/2016 1555   K 3.3 12/03/2016 0757   K 4.5 04/25/2016 1555   CL 94 (L) 12/03/2016 0757   CO2 27 12/03/2016 0757   CO2 26 04/25/2016 1555   BUN 8 12/03/2016 0757   BUN 18.6 04/25/2016 1555   CREATININE 0.6 12/03/2016 0757   CREATININE 0.7 04/25/2016 1555      Component Value Date/Time   CALCIUM 9.1 12/03/2016 0757   CALCIUM 8.7 04/25/2016 1555   ALKPHOS 79 12/03/2016 0757   ALKPHOS 51 04/25/2016 1555   AST 21 12/03/2016 0757   AST 14 04/25/2016 1555   ALT 17 12/03/2016 0757   ALT 17 04/25/2016 1555   BILITOT 0.90 12/03/2016 0757   BILITOT 0.65 04/25/2016 1555         Impression and Plan: Bradley Meadows is A 76 year old white male. 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 This is not operable. 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  At this point, I think we should consider him for durvalumab. From the latest PACIFIC trial, the addition of durvalumab after chemoradiation therapy for locally advanced non-small cell lung cancer improves survival significantly. With the addition of durvalumab, the progression free survival is 17 months. Without, is 6 months.  Unfortunately, he has not started. We really have to get him started. We will try to get him started later on this week.  He wants to have his treatments at [facility]. This is okay by me.  I probably would repeat a PET scan on him sometime in December.  I am just glad that he is improving.     Volanda Napoleon, MD 10/31/20186:00 PM

## 2016-12-04 NOTE — Telephone Encounter (Signed)
I would have him stop the med for 1 week to see if dizziness improves. Recheck in 1 week please.

## 2016-12-04 NOTE — Telephone Encounter (Signed)
Patient aware of providers recommendations.  

## 2016-12-11 ENCOUNTER — Ambulatory Visit (INDEPENDENT_AMBULATORY_CARE_PROVIDER_SITE_OTHER): Payer: Medicare Other | Admitting: Family Medicine

## 2016-12-11 ENCOUNTER — Encounter: Payer: Self-pay | Admitting: Family Medicine

## 2016-12-11 VITALS — BP 140/72 | HR 80 | Temp 98.1°F | Resp 18 | Ht 72.0 in | Wt 188.0 lb

## 2016-12-11 DIAGNOSIS — R002 Palpitations: Secondary | ICD-10-CM

## 2016-12-11 DIAGNOSIS — I2510 Atherosclerotic heart disease of native coronary artery without angina pectoris: Secondary | ICD-10-CM

## 2016-12-11 NOTE — Progress Notes (Signed)
Subjective:    Patient ID: NOTNAMED SCHOLZ, male    DOB: 05/18/1940, 76 y.o.   MRN: [id number]  Dizziness   Medication Refill   08/14/15 Patient reports 2 weeks of worsening dyspnea on exertion. He is still taking stiolto daily and combivent bid.  However he sees very little benefit from his breathing medicines. He is smoking again. He is audibly wheezing today on exam. He has markedly diminished breath sounds bilaterally with rhonchorous breath sounds and expiratory wheezing. Heart was in normal sinus rhythm with frequent PVCs. His weight is down approximately 12 pounds since his last office visit. He does have trace pretibial edema bilaterally but not severe. There is no evidence of JVD or overt failure. Past medical history as documented above is significant for congestive heart failure but the patient does not appear fluid overloaded today on examination. He denies any chest pain or angina.  My plan was: Patient seems to be suffering from a COPD exacerbation. I will give the patient 60 mg of Depo-Medrol IM 1 now. Patient was also given DuoNeb 3 mg of albuterol and 0.5 mg of Atrovent 1 now. I want him to go immediately for a chest x-ray. I will obtain a CBC to evaluate for anemia. Given his weight loss and he also reports trace hemoptysis, I want him to go get a chest x-ray immediately. I will also repeat an echocardiogram of his heart. Ejection fraction was 45% 2 years ago but I will evaluate for worsening of his systolic function. Begin prednisone 60 mg a day in addition to Levaquin 500 mg a day and recheck in 48 hours or sooner if worse.  Combivent 2 puff QID.  Quit Smoking!!!!  08/16/15 Patient is doing substantially better. He denies any fever or chest pain. His cough has improved. He is coughing up less mucus. His wheezing has improved. Chest x-ray revealed chronic bronchitic changes but no evidence of pneumonia or CHF. Lab work revealed a normal BNP and no evidence of anemia.  At that time, my  plan was:  Clinically improving. Finish Levaquin and prednisone. Continue Combivent 4 times a day. Continue stiolto daily. Recheck in one week if not 100% better or sooner if worse  12/26/15 Since I last saw the patient, he has had an echocardiogram which revealed an ejection fraction of 45-50%. However it also showed diffuse hypokinesis. He never followed up with cardiology as planned for the stress test. He states that he is having worsening shortness of breath, worsening dyspnea on exertion. He quit taking stiolto.  He is currently using Combivent 4 times a day with no benefit. He is 93% on room air. With ambulation, his oxygen drops to 88% quickly. On 2 L via nasal cannula, his oxygen increased to 98% on room air.  At that time, my plan was:  Patient's dyspnea is multifactorial. The majority is due to COPD. The patient recently quit smoking. I instructed him to resume stiolto 2 inhalations daily. Begin DuoNeb nebs 2.5/0.5 mg nebs every 6 hours when necessary. I will arrange for the patient to have oxygen at home 2 L via nasal cannula. I will try to schedule follow-up with cardiologist for that the patient can have a stress test to evaluate for possible causes of his global hypokinesis on echocardiogram. Recheck next week. Start the patient on prednisone 40 mg daily given the severity of his COPD. Will discontinue prednisone in one week if his breathing is improving  01/01/16 Patient's breathing is noticeably better. He  is not wheezing as much as he was last week. His lung sounds are improved. His dyspnea on exertion has improved. He is now resting comfortably in bed at night on oxygen. Overall he feels much better however he feels extremely anxious and jittery on high-dose prednisone.  AT that time, my plan was: On examination, breath sounds have improved noticeably. Wheezing is much better although still present. Patient's respiratory status seems improved. Subjectively he feels better. He is resting  better at night since starting oxygen. He denies any chest pain. Discontinue prednisone. Continue Stiolto 2 inhalations daily. Use DuoNeb nebs 2.5/0.5 one inhalation every 6 hours as needed for wheezing. Continue oxygen at night and as needed with activity. Continue to refrain from smoking. Recheck in one month. Follow-up with cardiology as planned.  01/31/16 Patient is scheduled to see the heart doctor on the 12th. He continues to report dyspnea on exertion. He is audibly wheezing today in the exam room. He is wearing oxygen at night but not during the day. He is not wearing oxygen with activity. He is still being compliant with stiolto.  He uses Combivent or DuoNeb nebs every 6 hours as needed. Unfortunately, he reports hemoptysis. This has been occurring over the last 6 weeks. There is trace hemoptysis in his sputum. He also reports worsening hoarseness. After 20 seconds, his voice will simply "go out". He denies any chest pain or fevers.  At that time, my plan was: I believe his dyspnea on exertion is primarily due to COPD. He is scheduled to see the heart doctor to complete the workup for the hypokinesis found on his echocardiogram and to rule out ischemia as a potential cause of his dyspnea. I expect that this will likely confirm that his shortness of breath is due to his severe COPD.  Continue stiolto.  I recommended that he use oxygen with activity. We can consider putting him on a standing dose of prednisone if necessary for shortness of breath but will defer that at the present time. Right now I'm very concerned by his hemoptysis and hoarseness. I have recommended a CT scan of the lungs to evaluate for possible causes of hemoptysis. I will also schedule the patient to see ENT for laryngoscopy given his chronic hoarseness and hemoptysis to rule out a lesion on his vocal cords. Await the results of these referrals. Patient received his flu shot today.  07/15/16 Unfortunately, the workup at that time  revealed a mass near his esophagus. 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 PET scan shows possible metastasis to gastrohepatic ligament. 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 He has undergone 3 cycles of chemotherapy and received radiation treatments. He is scheduled for follow-up with his oncologist to repeat a PET scan. Surprisingly he has done extremely well. His cancer is nonoperable however his swallowing is much better after the chemotherapy and radiation treatments. For instance, yesterday he was able to eat eggs, ham sandwich, and a liver putting sandwich without difficulty. His strength is improving. His spirits seem upbeat. His shortness of breath is improving. He states that his breathing is approximately 80% improved compared to his last visit after apparent shrinking of the mass pressing on his trachea.  He continues to use hydrocodone as needed for joint pains and back pains. He is using 1 tablet daily as needed. He is requesting a refill on this. He is occasionally smoking and I counseled him not to do this. He is also  taking a protein supplement at least once a day with his meals.   At that time, my plan was:  Patient has multiple medical comorbidities. 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 That being said, he is doing remarkably well with treatment. His breathing is better. His swallowing is better. He denies any dysphagia. His strength is improving. All of this is reassuring. I reviewed his most recent lab work obtained in May which showed stable lab days regarding his CBC and CMP. I see no reason to repeat that at the present time. Currently he is asymptomatic from his congestive heart failure. Therefore I'll not add any other medications as he is still undergoing chemotherapy. His COPD is currently stable on his long-acting bronchodilators. I continue to encourage  the patient to avoid smoking. I refilled his pain medication and I gave him 90 tablets of that we will have to travel to the office once a month for medication.  12/01/16 Mr. Alldredge is A 76 year old white male. 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 This is not operable. 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  At this point, I think we should consider him for durvalumab.  He is scheduled to see his oncologist next week to discuss this.  His biggest concern today is dizziness.  He states that he has had dizziness ever since he initiated treatment for his cancer.  During that time, he has lost approximately 16 pounds at least although he believes he is lost more than that.  He was taking metoprolol 25 mg p.o. twice daily in addition to Maxzide.  Dizziness is worse when he stands up quickly.  Although he also has some vertigo-like component to the dizziness and that he becomes dizzy if he turns his head quickly side to side.  The majority of his dizziness is generalized weakness and unsteadiness on his feet and orthostatic dizziness.    His medication list today is uncertain.  He is not taking oxycodone.  He is taking hydrocodone occasionally.  He is not using Xanax.  He is not taking Dramamine.  He is compliant with STIOLTO and uses Combivent as needed.  He is no longer smoking.  He uses his oxygen as needed particular with ambulation.  Today in the office, on room air, he is 93% at rest.  With ambulation he drops to 88% on room air.  He benefits with oxygen when he is up walking around so he does not become short of breath quickly.  He is due for his flu shot.  At that time, my plan was: Diagnosis is likely multifactorial.  Some of his dizziness could be due to age.  Some of it could be due to hypotension.  Some of it could be due to generalized deconditioning after treatment for malignancy.  I would like the patient to  decrease his metoprolol to 12.5 mg p.o. twice daily and then reassess in 2 weeks.  I will gradually wean him off that medication as tolerated to see if his dizziness would be improved.  Some of his dizziness sounds like vertigo.  If the dizziness is not improving, I would proceed with an MRI of the brain to evaluate for posterior circulation infarct although his neurologic exam today is completely normal.  He has a negative Dix-Hallpike maneuver.  There is no evidence of a sinus infection or an ear infection.  I  will also check a CBC as well as a CMP to evaluate for anemia or electrolyte abnormalities.  He still requires oxygen 2 L via nasal cannula with ambulation.  Without oxygen, his pulse oximetry dropped to 88% with ambulation.  12/11/16 Ultimately loprerssor was discontinued.  He also was not taking maxzide.  Here for recheck.  His blood pressure today is borderline at 025 systolic.  However on examination, he appears to have an irregularly irregular rhythm that I have not previously appreciated on his exam.  There is no documented history of atrial fibrillation.  Symptomatically, the patient states that he feels much better.  He denies any further dizziness.  He denies any vertigo.  He is walking without any hesitation.  Therefore subjectively he is much improved   Past Medical History:  Diagnosis Date  . Arthritis   . Asthma   . CHF (congestive heart failure) (HCC)    ef=45-50%  . COPD (chronic obstructive pulmonary disease) ([place])    home O2  . Coronary artery disease    40-50% mLAD, 40% pRCA, 30% mRCA, occluded CX with retrograde filling 06/01/06 cath  . GERD (gastroesophageal reflux disease)    ulcers  . Goals of care, counseling/discussion 04/07/2016  . History of external beam radiation therapy 04/24/16-06/04/16   chest 60 Gy in 30 fractions  . Hyperlipidemia   . Hypertension   . Lung cancer, primary, 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, right ([place]) 04/07/2016  . PVC's (premature  ventricular contractions)    2017  . Squamous cell lung cancer, right ([place]) 04/07/2016   Past Surgical History:  Procedure Laterality Date  . EXPLORATORY LAPAROTOMY     for ulcer  "washed me out with a hose pipe"  . EYE SURGERY     cataracts  . HERNIA REPAIR    . IR GENERIC HISTORICAL  04/10/2016   IR US GUIDE VASC ACCESS RIGHT 04/10/2016 Markus Daft, MD [facility]  . IR GENERIC HISTORICAL  04/10/2016   IR FLUORO GUIDE PORT INSERTION RIGHT 04/10/2016 Markus Daft, MD [facility]  . IR GENERIC HISTORICAL  04/10/2016   IR FLUORO RM 30-60 MIN 04/10/2016 Markus Daft, MD [facility]  . IR GENERIC HISTORICAL  04/18/2016   IR FLUORO RM 30-60 MIN 04/18/2016 Greggory Keen, MD [facility]  . JOINT REPLACEMENT     knee both  . PORTACATH PLACEMENT Right 04/10/2016   Current Outpatient Medications on File Prior to Visit  Medication Sig Dispense Refill  . atorvastatin (LIPITOR) 40 MG tablet TAKE 1 TABLET(40 MG) BY MOUTH DAILY 90 tablet 0  . COMBIVENT RESPIMAT 20-100 MCG/ACT AERS respimat INHALE 1 PUFF BY MOUTH EVERY 6 HOURS AS NEEDED FOR WHEEZING 4 g 0  . HYDROcodone-acetaminophen (NORCO/VICODIN) 5-325 MG tablet Take 1 tablet by mouth 2 (two) times daily as needed for moderate pain. 90 tablet 0  . ipratropium-albuterol (DUONEB) 0.5-2.5 (3) MG/3ML SOLN USE 1 VIAL VIA NEBULIZER EVERY 6 HOURS AS NEEDED FOR SHORTNESS OF BREATH OR WHEEZING 990 mL 0  . Multiple Vitamin (MULTIVITAMIN) tablet Take 1 tablet by mouth daily. Centrum Silver    . omeprazole (PRILOSEC) 20 MG capsule take 1 capsule by mouth once daily  1  . STIOLTO RESPIMAT 2.5-2.5 MCG/ACT AERS INHALE 2 PUFFS BY MOUTH DAILY 4 g 5  . CARAFATE 1 GM/10ML suspension Take 1 g by mouth 2 (two) times daily.     . metoprolol tartrate (LOPRESSOR) 25 MG tablet TAKE 1 TABLET(25 MG) BY MOUTH TWICE DAILY (Patient not  taking: Reported on 12/11/2016) 180 tablet 0  . triamterene-hydrochlorothiazide (MAXZIDE-25) 37.5-25 MG tablet Take 1 tablet by mouth every morning.  3    No current facility-administered medications on file prior to visit.    Allergies  Allergen Reactions  . No Known Allergies    Social History   Socioeconomic History  . Marital status: Divorced    Spouse name: Not on file  . Number of children: 3  . Years of education: 9  . Highest education level: Not on file  Social Needs  . Financial resource strain: Not on file  . Food insecurity - worry: Not on file  . Food insecurity - inability: Not on file  . Transportation needs - medical: Not on file  . Transportation needs - non-medical: Not on file  Occupational History  . Occupation: RetiredResearch officer, political party: RETIRED  Tobacco Use  . Smoking status: Former Smoker    Packs/day: 1.50    Years: 30.00    Pack years: 45.00    Types: Cigarettes    Last attempt to quit: 09/29/2015    Years since quitting: 1.2  . Smokeless tobacco: Never Used  Substance and Sexual Activity  . Alcohol use: No  . Drug use: No  . Sexual activity: Not Currently  Other Topics Concern  . Not on file  Social History Narrative   Grew up in [place] area.    Left school after 9th grade. No father and had to work.    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       Health Care POA:    Emergency Contact: sister, Hector Brunswick (h) [phone]   End of Life Plan:    Who lives with you: self   Any pets: Lorrin Goodell   Diet: Pt limits sugars and starches and focuses on vegetables an protein. Pt has lost 50 lbs over several years.   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   Seatbelts: Pt reports wearing seatbelt when in vehicle.   Sun Exposure/Protection: Pt does not use sun protectin.   Hobbies: Racing, TV, gardening, Architect            Review of Systems  Neurological: Positive for dizziness.  All other systems reviewed and are negative.      Objective:   Physical Exam  Constitutional: He is oriented to person,  place, and time. He appears well-developed and well-nourished. No distress.  Neck: Neck supple. No JVD present. No thyromegaly present.  Cardiovascular: Normal rate, normal heart sounds and intact distal pulses. An irregularly irregular rhythm present.  No murmur heard. Pulmonary/Chest: Effort normal. No respiratory distress. He has decreased breath sounds. He has wheezes. He has no rhonchi. He has no rales.  Abdominal: Soft. Bowel sounds are normal. He exhibits no distension. There is no tenderness. There is no rebound and no guarding.  Musculoskeletal: He exhibits edema.  Lymphadenopathy:    He has no cervical adenopathy.  Neurological: He is alert and oriented to person, place, and time. He has normal reflexes. No cranial nerve deficit. He exhibits normal muscle tone. Coordination normal.  Skin: He is not diaphoretic.  Vitals reviewed.   hoarse      Assessment & Plan:  Palpitations - Plan: EKG 12-Lead  Symptomatically, the patient feels better since discontinuing his blood pressure medication.  His exam is concerning for an irregularly irregular rhythm.  I will obtain an EKG to rule out atrial fibrillation.  Patient has a history of congestive heart failure with an ejection fraction of 45-50%.  He would benefit from being on a beta-blocker.  However his dizziness which have persisted for months did not improve until we discontinue the medication making me think that hypotension/relative hypotension was the cause of his dizziness and orthostatic disequilibrium.  EKG today in the office reveals normal sinus rhythm with definite discernible P waves.  He does have a right bundle branch block and frequent ectopic beats explaining his irregularly irregular rhythm however there is no EKG finding that necessitates anticoagulation.  We will resume Lopressor to prevent and control PVCs as well as treat his congestive heart failure however we will resume it at a lower dose than before.  He will now be  taking 12.5 mg p.o. twice daily.  Recheck in 3 months

## 2016-12-12 ENCOUNTER — Ambulatory Visit: Payer: Medicare Other

## 2016-12-12 ENCOUNTER — Other Ambulatory Visit (HOSPITAL_BASED_OUTPATIENT_CLINIC_OR_DEPARTMENT_OTHER): Payer: Medicare Other

## 2016-12-12 ENCOUNTER — Ambulatory Visit (HOSPITAL_BASED_OUTPATIENT_CLINIC_OR_DEPARTMENT_OTHER): Payer: Medicare Other

## 2016-12-12 DIAGNOSIS — C3491 Malignant neoplasm of unspecified part of right bronchus or lung: Secondary | ICD-10-CM

## 2016-12-12 DIAGNOSIS — Z5112 Encounter for antineoplastic immunotherapy: Secondary | ICD-10-CM | POA: Diagnosis not present

## 2016-12-12 DIAGNOSIS — R5383 Other fatigue: Secondary | ICD-10-CM

## 2016-12-12 DIAGNOSIS — C772 Secondary and unspecified malignant neoplasm of intra-abdominal lymph nodes: Secondary | ICD-10-CM | POA: Diagnosis not present

## 2016-12-12 LAB — CBC WITH DIFFERENTIAL (CANCER CENTER ONLY)
BASO#: 0.1 10*3/uL (ref 0.0–0.2)
BASO%: 0.9 % (ref 0.0–2.0)
EOS ABS: 0.3 10*3/uL (ref 0.0–0.5)
EOS%: 4.8 % (ref 0.0–7.0)
HCT: 34.4 % — ABNORMAL LOW (ref 38.7–49.9)
HEMOGLOBIN: 11.8 g/dL — AB (ref 13.0–17.1)
LYMPH#: 1 10*3/uL (ref 0.9–3.3)
LYMPH%: 16.9 % (ref 14.0–48.0)
MCH: 31.9 pg (ref 28.0–33.4)
MCHC: 34.3 g/dL (ref 32.0–35.9)
MCV: 93 fL (ref 82–98)
MONO#: 0.6 10*3/uL (ref 0.1–0.9)
MONO%: 9.8 % (ref 0.0–13.0)
NEUT%: 67.6 % (ref 40.0–80.0)
NEUTROS ABS: 3.8 10*3/uL (ref 1.5–6.5)
Platelets: 161 10*3/uL (ref 145–400)
RBC: 3.7 10*6/uL — ABNORMAL LOW (ref 4.20–5.70)
RDW: 14.7 % (ref 11.1–15.7)
WBC: 5.6 10*3/uL (ref 4.0–10.0)

## 2016-12-12 LAB — CMP (CANCER CENTER ONLY)
ALT(SGPT): 16 U/L (ref 10–47)
AST: 18 U/L (ref 11–38)
Albumin: 3.2 g/dL — ABNORMAL LOW (ref 3.3–5.5)
Alkaline Phosphatase: 65 U/L (ref 26–84)
BUN: 7 mg/dL (ref 7–22)
CHLORIDE: 101 meq/L (ref 98–108)
CO2: 29 meq/L (ref 18–33)
CREATININE: 0.8 mg/dL (ref 0.6–1.2)
Calcium: 8.9 mg/dL (ref 8.0–10.3)
Glucose, Bld: 103 mg/dL (ref 73–118)
Potassium: 4 mEq/L (ref 3.3–4.7)
SODIUM: 138 meq/L (ref 128–145)
Total Bilirubin: 0.7 mg/dl (ref 0.20–1.60)
Total Protein: 5.5 g/dL — ABNORMAL LOW (ref 6.4–8.1)

## 2016-12-12 MED ORDER — DURVALUMAB 500 MG/10ML IV SOLN: 10.0000 mg/kg | Freq: Once | INTRAVENOUS | Status: DC

## 2016-12-12 MED ORDER — SODIUM CHLORIDE 0.9% FLUSH
10.0000 mL | INTRAVENOUS | Status: DC | PRN
  Administered 2016-12-12: 10 mL
  Filled 2016-12-12: qty 10

## 2016-12-12 MED ORDER — SODIUM CHLORIDE 0.9 % IV SOLN
10.6000 mg/kg | Freq: Once | INTRAVENOUS | Status: AC
  Administered 2016-12-12: 860 mg via INTRAVENOUS
  Filled 2016-12-12: qty 10

## 2016-12-12 MED ORDER — HEPARIN SOD (PORK) LOCK FLUSH 100 UNIT/ML IV SOLN
500.0000 [IU] | Freq: Once | INTRAVENOUS | Status: AC | PRN
  Administered 2016-12-12: 500 [IU]
  Filled 2016-12-12: qty 5

## 2016-12-12 MED ORDER — SODIUM CHLORIDE 0.9 % IV SOLN: Freq: Once | INTRAVENOUS | Status: DC

## 2016-12-12 NOTE — Patient Instructions (Signed)
Durvalumab injection  What is this medicine?  DURVALUMAB (dur VAL ue mab) is a monoclonal antibody. It is used to treat urothelial cancer.  This medicine may be used for other purposes; ask your health care provider or pharmacist if you have questions.  COMMON BRAND NAME(S): IMFINZI  What should I tell my health care provider before I take this medicine?  They need to know if you have any of these conditions:  -diabetes  -immune system problems  -infection  -inflammatory bowel disease  -kidney disease  -liver disease  -lung or breathing disease  -lupus  -organ transplant  -stomach or intestine problems  -thyroid disease  -an unusual or allergic reaction to durvalumab, other medicines, foods, dyes, or preservatives  -pregnant or trying to get pregnant  -breast-feeding  How should I use this medicine?  This medicine is for infusion into a vein. It is given by a health care professional in a hospital or clinic setting.  A special MedGuide will be given to you before each treatment. Be sure to read this information carefully each time.  Talk to your pediatrician regarding the use of this medicine in children. Special care may be needed.  Overdosage: If you think you have taken too much of this medicine contact a poison control center or emergency room at once.  NOTE: This medicine is only for you. Do not share this medicine with others.  What if I miss a dose?  It is important not to miss your dose. Call your doctor or health care professional if you are unable to keep an appointment.  What may interact with this medicine?  Interactions have not been studied.  This list may not describe all possible interactions. Give your health care provider a list of all the medicines, herbs, non-prescription drugs, or dietary supplements you use. Also tell them if you smoke, drink alcohol, or use illegal drugs. Some items may interact with your medicine.  What should I watch for while using this medicine?  This drug may make you  feel generally unwell. Continue your course of treatment even though you feel ill unless your doctor tells you to stop.  You may need blood work done while you are taking this medicine.  Do not become pregnant while taking this medicine or for 3 months after stopping it. Women should inform their doctor if they wish to become pregnant or think they might be pregnant. There is a potential for serious side effects to an unborn child. Talk to your health care professional or pharmacist for more information. Do not breast-feed an infant while taking this medicine or for 3 months after stopping it.  What side effects may I notice from receiving this medicine?  Side effects that you should report to your doctor or health care professional as soon as possible:  -allergic reactions like skin rash, itching or hives, swelling of the face, lips, or tongue  -black, tarry stools  -bloody or watery diarrhea  -breathing problems  -change in emotions or moods  -change in sex drive  -changes in vision  -chest pain or chest tightness  -chills  -confusion  -cough  -facial flushing  -fever  -headache  -signs and symptoms of high blood sugar such as dizziness; dry mouth; dry skin; fruity breath; nausea; stomach pain; increased hunger or thirst; increased urination  -signs and symptoms of liver injury like dark yellow or brown urine; general ill feeling or flu-like symptoms; light-colored stools; loss of appetite; nausea; right upper belly pain;   unusually weak or tired; yellowing of the eyes or skin  -stomach pain  -trouble passing urine or change in the amount of urine  -weight gain or weight loss  Side effects that usually do not require medical attention (report these to your doctor or health care professional if they continue or are bothersome):  -bone pain  -constipation  -loss of appetite  -muscle pain  -nausea  -swelling of the ankles, feet, hands  -tiredness  This list may not describe all possible side effects. Call your doctor  for medical advice about side effects. You may report side effects to FDA at 1-800-FDA-1088.  Where should I keep my medicine?  This drug is given in a hospital or clinic and will not be stored at home.  NOTE: This sheet is a summary. It may not cover all possible information. If you have questions about this medicine, talk to your doctor, pharmacist, or health care provider.  © 2018 [employer] (2015-08-24 15:50:36)

## 2016-12-15 ENCOUNTER — Telehealth: Payer: Self-pay

## 2016-12-15 NOTE — Telephone Encounter (Signed)
Message left on generic VM to contact office re: 1st Imfinzi infusion on Friday. dph

## 2016-12-16 ENCOUNTER — Telehealth: Payer: Self-pay | Admitting: Oncology

## 2016-12-16 ENCOUNTER — Other Ambulatory Visit: Payer: Self-pay | Admitting: *Deleted

## 2016-12-16 DIAGNOSIS — M7989 Other specified soft tissue disorders: Secondary | ICD-10-CM

## 2016-12-16 DIAGNOSIS — C3491 Malignant neoplasm of unspecified part of right bronchus or lung: Secondary | ICD-10-CM

## 2016-12-16 MED ORDER — CARAFATE 1 GM/10ML PO SUSP: 1.0000 g | Freq: Two times a day (BID) | ORAL | 6 refills | Status: DC

## 2016-12-16 NOTE — Telephone Encounter (Signed)
Called patient's daughter to see if he is still using Carafate.  She said he is for indigestion but that the refill request should have gone to Dr. Marin Olp.  Advised her that [facility] will be contacted to change the prescribing doctor.

## 2016-12-26 ENCOUNTER — Ambulatory Visit (HOSPITAL_BASED_OUTPATIENT_CLINIC_OR_DEPARTMENT_OTHER): Payer: Medicare Other

## 2016-12-26 ENCOUNTER — Other Ambulatory Visit (HOSPITAL_BASED_OUTPATIENT_CLINIC_OR_DEPARTMENT_OTHER): Payer: Medicare Other

## 2016-12-26 ENCOUNTER — Ambulatory Visit: Payer: Medicare Other

## 2016-12-26 VITALS — BP 106/86 | HR 56 | Temp 97.7°F | Resp 20

## 2016-12-26 DIAGNOSIS — C3491 Malignant neoplasm of unspecified part of right bronchus or lung: Secondary | ICD-10-CM

## 2016-12-26 DIAGNOSIS — C772 Secondary and unspecified malignant neoplasm of intra-abdominal lymph nodes: Secondary | ICD-10-CM

## 2016-12-26 DIAGNOSIS — Z7189 Other specified counseling: Secondary | ICD-10-CM

## 2016-12-26 DIAGNOSIS — Z5112 Encounter for antineoplastic immunotherapy: Secondary | ICD-10-CM | POA: Diagnosis not present

## 2016-12-26 DIAGNOSIS — C3400 Malignant neoplasm of unspecified main bronchus: Secondary | ICD-10-CM

## 2016-12-26 DIAGNOSIS — Z95828 Presence of other vascular implants and grafts: Secondary | ICD-10-CM

## 2016-12-26 LAB — COMPREHENSIVE METABOLIC PANEL
ALBUMIN: 3.7 g/dL (ref 3.5–5.0)
ALK PHOS: 78 U/L (ref 40–150)
ALT: 12 U/L (ref 0–55)
AST: 15 U/L (ref 5–34)
Anion Gap: 9 mEq/L (ref 3–11)
BILIRUBIN TOTAL: 0.55 mg/dL (ref 0.20–1.20)
BUN: 10.1 mg/dL (ref 7.0–26.0)
CO2: 26 mEq/L (ref 22–29)
CREATININE: 0.8 mg/dL (ref 0.7–1.3)
Calcium: 9.1 mg/dL (ref 8.4–10.4)
Chloride: 96 mEq/L — ABNORMAL LOW (ref 98–109)
EGFR: >60 mL/min/{1.73_m2} (ref >60)
GLUCOSE: 97 mg/dL (ref 70–140)
Potassium: 3.6 mEq/L (ref 3.5–5.1)
SODIUM: 131 meq/L — AB (ref 136–145)
TOTAL PROTEIN: 6.5 g/dL (ref 6.4–8.3)

## 2016-12-26 LAB — CBC WITH DIFFERENTIAL/PLATELET
BASO%: 0.8 % (ref 0.0–2.0)
Basophils Absolute: 0 10*3/uL (ref 0.0–0.1)
EOS ABS: 0.3 10*3/uL (ref 0.0–0.5)
EOS%: 4.6 % (ref 0.0–7.0)
HCT: 38.6 % (ref 38.4–49.9)
HEMOGLOBIN: 13.1 g/dL (ref 13.0–17.1)
LYMPH#: 1 10*3/uL (ref 0.9–3.3)
LYMPH%: 16.7 % (ref 14.0–49.0)
MCH: 30.9 pg (ref 27.2–33.4)
MCHC: 33.8 g/dL (ref 32.0–36.0)
MCV: 91.2 fL (ref 79.3–98.0)
MONO#: 0.5 10*3/uL (ref 0.1–0.9)
MONO%: 8.9 % (ref 0.0–14.0)
NEUT%: 69 % (ref 39.0–75.0)
NEUTROS ABS: 4.1 10*3/uL (ref 1.5–6.5)
Platelets: 174 10*3/uL (ref 140–400)
RBC: 4.23 10*6/uL (ref 4.20–5.82)
RDW: 15.1 % — AB (ref 11.0–14.6)
WBC: 5.9 10*3/uL (ref 4.0–10.3)

## 2016-12-26 MED ORDER — SODIUM CHLORIDE 0.9 % IV SOLN
10.5000 mg/kg | Freq: Once | INTRAVENOUS | Status: AC
  Administered 2016-12-26: 860 mg via INTRAVENOUS
  Filled 2016-12-26: qty 10

## 2016-12-26 MED ORDER — SODIUM CHLORIDE 0.9 % IV SOLN
Freq: Once | INTRAVENOUS | Status: AC
  Administered 2016-12-26: 13:00:00 via INTRAVENOUS

## 2016-12-26 MED ORDER — SODIUM CHLORIDE 0.9% FLUSH
10.0000 mL | INTRAVENOUS | Status: DC | PRN
  Administered 2016-12-26: 10 mL via INTRAVENOUS
  Filled 2016-12-26: qty 10

## 2016-12-26 MED ORDER — HEPARIN SOD (PORK) LOCK FLUSH 100 UNIT/ML IV SOLN
500.0000 [IU] | Freq: Once | INTRAVENOUS | Status: AC | PRN
  Administered 2016-12-26: 500 [IU]
  Filled 2016-12-26: qty 5

## 2016-12-26 MED ORDER — SODIUM CHLORIDE 0.9% FLUSH
10.0000 mL | INTRAVENOUS | Status: DC | PRN
  Administered 2016-12-26: 10 mL
  Filled 2016-12-26: qty 10

## 2016-12-26 NOTE — Patient Instructions (Signed)
[facility] Discharge Instructions for Patients Receiving Chemotherapy  Today you received the following chemotherapy agents: Imfinzi.  To help prevent nausea and vomiting after your treatment, we encourage you to take your nausea medication as directed.   If you develop nausea and vomiting that is not controlled by your nausea medication, call the clinic.   BELOW ARE SYMPTOMS THAT SHOULD BE REPORTED IMMEDIATELY:  *FEVER GREATER THAN 100.5 F  *CHILLS WITH OR WITHOUT FEVER  NAUSEA AND VOMITING THAT IS NOT CONTROLLED WITH YOUR NAUSEA MEDICATION  *UNUSUAL SHORTNESS OF BREATH  *UNUSUAL BRUISING OR BLEEDING  TENDERNESS IN MOUTH AND THROAT WITH OR WITHOUT PRESENCE OF ULCERS  *URINARY PROBLEMS  *BOWEL PROBLEMS  UNUSUAL RASH Items with * indicate a potential emergency and should be followed up as soon as possible.  Feel free to call the clinic you have any questions or concerns. The clinic phone number is [phone].  Please show the [facility] at check-in to the Emergency Department and triage nurse.

## 2016-12-28 ENCOUNTER — Other Ambulatory Visit: Payer: Self-pay | Admitting: Family Medicine

## 2016-12-28 DIAGNOSIS — Z8711 Personal history of peptic ulcer disease: Secondary | ICD-10-CM

## 2016-12-28 DIAGNOSIS — I10 Essential (primary) hypertension: Secondary | ICD-10-CM

## 2016-12-29 NOTE — Telephone Encounter (Signed)
Medication refilled per protocol. 

## 2017-01-08 ENCOUNTER — Other Ambulatory Visit: Payer: Self-pay | Admitting: *Deleted

## 2017-01-08 ENCOUNTER — Other Ambulatory Visit: Payer: Self-pay | Admitting: Family Medicine

## 2017-01-08 DIAGNOSIS — C3491 Malignant neoplasm of unspecified part of right bronchus or lung: Secondary | ICD-10-CM

## 2017-01-09 ENCOUNTER — Other Ambulatory Visit (HOSPITAL_BASED_OUTPATIENT_CLINIC_OR_DEPARTMENT_OTHER): Payer: Medicare Other

## 2017-01-09 ENCOUNTER — Ambulatory Visit (HOSPITAL_BASED_OUTPATIENT_CLINIC_OR_DEPARTMENT_OTHER): Payer: Medicare Other

## 2017-01-09 ENCOUNTER — Ambulatory Visit: Payer: Medicare Other

## 2017-01-09 VITALS — BP 147/68 | HR 63 | Temp 98.0°F | Resp 20 | Wt 173.2 lb

## 2017-01-09 DIAGNOSIS — C3491 Malignant neoplasm of unspecified part of right bronchus or lung: Secondary | ICD-10-CM

## 2017-01-09 DIAGNOSIS — Z5112 Encounter for antineoplastic immunotherapy: Secondary | ICD-10-CM

## 2017-01-09 DIAGNOSIS — Z95828 Presence of other vascular implants and grafts: Secondary | ICD-10-CM

## 2017-01-09 DIAGNOSIS — C772 Secondary and unspecified malignant neoplasm of intra-abdominal lymph nodes: Secondary | ICD-10-CM

## 2017-01-09 LAB — CBC WITH DIFFERENTIAL/PLATELET
BASO%: 0.3 % (ref 0.0–2.0)
Basophils Absolute: 0 10e3/uL (ref 0.0–0.1)
EOS%: 3.8 % (ref 0.0–7.0)
Eosinophils Absolute: 0.3 10e3/uL (ref 0.0–0.5)
HCT: 37.3 % — ABNORMAL LOW (ref 38.4–49.9)
HGB: 12.8 g/dL — ABNORMAL LOW (ref 13.0–17.1)
LYMPH%: 15.2 % (ref 14.0–49.0)
MCH: 31.2 pg (ref 27.2–33.4)
MCHC: 34.3 g/dL (ref 32.0–36.0)
MCV: 91 fL (ref 79.3–98.0)
MONO#: 0.7 10e3/uL (ref 0.1–0.9)
MONO%: 11.1 % (ref 0.0–14.0)
NEUT#: 4.6 10e3/uL (ref 1.5–6.5)
NEUT%: 69.6 % (ref 39.0–75.0)
Platelets: 152 10e3/uL (ref 140–400)
RBC: 4.1 10e6/uL — ABNORMAL LOW (ref 4.20–5.82)
RDW: 14.4 % (ref 11.0–14.6)
WBC: 6.7 10e3/uL (ref 4.0–10.3)
lymph#: 1 10e3/uL (ref 0.9–3.3)

## 2017-01-09 LAB — COMPREHENSIVE METABOLIC PANEL
ALT: 18 U/L (ref 0–55)
AST: 28 U/L (ref 5–34)
Albumin: 3.7 g/dL (ref 3.5–5.0)
Alkaline Phosphatase: 81 U/L (ref 40–150)
Anion Gap: 10 mEq/L (ref 3–11)
BUN: 9.9 mg/dL (ref 7.0–26.0)
CHLORIDE: 95 meq/L — AB (ref 98–109)
CO2: 25 meq/L (ref 22–29)
Calcium: 9 mg/dL (ref 8.4–10.4)
Creatinine: 0.8 mg/dL (ref 0.7–1.3)
GLUCOSE: 87 mg/dL (ref 70–140)
POTASSIUM: 3.6 meq/L (ref 3.5–5.1)
SODIUM: 130 meq/L — AB (ref 136–145)
Total Bilirubin: 0.66 mg/dL (ref 0.20–1.20)
Total Protein: 6.3 g/dL — ABNORMAL LOW (ref 6.4–8.3)

## 2017-01-09 MED ORDER — SODIUM CHLORIDE 0.9 % IV SOLN
Freq: Once | INTRAVENOUS | Status: AC
  Administered 2017-01-09: 13:00:00 via INTRAVENOUS

## 2017-01-09 MED ORDER — PEGFILGRASTIM 6 MG/0.6ML ~~LOC~~ PSKT
PREFILLED_SYRINGE | SUBCUTANEOUS | Status: AC
  Filled 2017-01-09: qty 0.6

## 2017-01-09 MED ORDER — DURVALUMAB 500 MG/10ML IV SOLN
10.5000 mg/kg | Freq: Once | INTRAVENOUS | Status: AC
  Administered 2017-01-09: 860 mg via INTRAVENOUS
  Filled 2017-01-09: qty 10

## 2017-01-09 MED ORDER — SODIUM CHLORIDE 0.9% FLUSH
10.0000 mL | INTRAVENOUS | Status: DC | PRN
  Administered 2017-01-09: 10 mL
  Filled 2017-01-09: qty 10

## 2017-01-09 MED ORDER — HEPARIN SOD (PORK) LOCK FLUSH 100 UNIT/ML IV SOLN
500.0000 [IU] | Freq: Once | INTRAVENOUS | Status: AC | PRN
  Administered 2017-01-09: 500 [IU]
  Filled 2017-01-09: qty 5

## 2017-01-09 MED ORDER — SODIUM CHLORIDE 0.9% FLUSH
10.0000 mL | INTRAVENOUS | Status: DC | PRN
  Administered 2017-01-09: 10 mL via INTRAVENOUS
  Filled 2017-01-09: qty 10

## 2017-01-09 NOTE — Patient Instructions (Signed)
[facility] Discharge Instructions for Patients Receiving Chemotherapy  Today you received the following chemotherapy agents: Imfinzi.  To help prevent nausea and vomiting after your treatment, we encourage you to take your nausea medication as directed.   If you develop nausea and vomiting that is not controlled by your nausea medication, call the clinic.   BELOW ARE SYMPTOMS THAT SHOULD BE REPORTED IMMEDIATELY:  *FEVER GREATER THAN 100.5 F  *CHILLS WITH OR WITHOUT FEVER  NAUSEA AND VOMITING THAT IS NOT CONTROLLED WITH YOUR NAUSEA MEDICATION  *UNUSUAL SHORTNESS OF BREATH  *UNUSUAL BRUISING OR BLEEDING  TENDERNESS IN MOUTH AND THROAT WITH OR WITHOUT PRESENCE OF ULCERS  *URINARY PROBLEMS  *BOWEL PROBLEMS  UNUSUAL RASH Items with * indicate a potential emergency and should be followed up as soon as possible.  Feel free to call the clinic you have any questions or concerns. The clinic phone number is [phone].  Please show the [facility] at check-in to the Emergency Department and triage nurse.

## 2017-01-09 NOTE — Patient Instructions (Signed)

## 2017-01-15 ENCOUNTER — Other Ambulatory Visit: Payer: Self-pay | Admitting: *Deleted

## 2017-01-15 DIAGNOSIS — C3491 Malignant neoplasm of unspecified part of right bronchus or lung: Secondary | ICD-10-CM

## 2017-01-16 ENCOUNTER — Ambulatory Visit (HOSPITAL_BASED_OUTPATIENT_CLINIC_OR_DEPARTMENT_OTHER): Payer: Medicare Other

## 2017-01-16 ENCOUNTER — Encounter: Payer: Self-pay | Admitting: Hematology & Oncology

## 2017-01-16 ENCOUNTER — Other Ambulatory Visit: Payer: Self-pay

## 2017-01-16 ENCOUNTER — Other Ambulatory Visit (HOSPITAL_BASED_OUTPATIENT_CLINIC_OR_DEPARTMENT_OTHER): Payer: Medicare Other

## 2017-01-16 ENCOUNTER — Ambulatory Visit (HOSPITAL_BASED_OUTPATIENT_CLINIC_OR_DEPARTMENT_OTHER): Payer: Medicare Other | Admitting: Family

## 2017-01-16 VITALS — BP 112/65 | HR 61 | Temp 98.2°F | Resp 18 | Wt 186.0 lb

## 2017-01-16 DIAGNOSIS — I2510 Atherosclerotic heart disease of native coronary artery without angina pectoris: Secondary | ICD-10-CM | POA: Diagnosis not present

## 2017-01-16 DIAGNOSIS — C3491 Malignant neoplasm of unspecified part of right bronchus or lung: Secondary | ICD-10-CM

## 2017-01-16 DIAGNOSIS — C7989 Secondary malignant neoplasm of other specified sites: Secondary | ICD-10-CM

## 2017-01-16 DIAGNOSIS — Z95828 Presence of other vascular implants and grafts: Secondary | ICD-10-CM

## 2017-01-16 LAB — CBC WITH DIFFERENTIAL (CANCER CENTER ONLY)
BASO#: 0 10*3/uL (ref 0.0–0.2)
BASO%: 0.5 % (ref 0.0–2.0)
EOS%: 6 % (ref 0.0–7.0)
Eosinophils Absolute: 0.4 10*3/uL (ref 0.0–0.5)
HEMATOCRIT: 35.8 % — AB (ref 38.7–49.9)
HGB: 12.4 g/dL — ABNORMAL LOW (ref 13.0–17.1)
LYMPH#: 1 10*3/uL (ref 0.9–3.3)
LYMPH%: 16.8 % (ref 14.0–48.0)
MCH: 31.6 pg (ref 28.0–33.4)
MCHC: 34.6 g/dL (ref 32.0–35.9)
MCV: 91 fL (ref 82–98)
MONO#: 0.7 10*3/uL (ref 0.1–0.9)
MONO%: 11.6 % (ref 0.0–13.0)
NEUT#: 3.8 10*3/uL (ref 1.5–6.5)
NEUT%: 65.1 % (ref 40.0–80.0)
Platelets: 175 10*3/uL (ref 145–400)
RBC: 3.92 10*6/uL — ABNORMAL LOW (ref 4.20–5.70)
RDW: 14.3 % (ref 11.1–15.7)
WBC: 5.9 10*3/uL (ref 4.0–10.0)

## 2017-01-16 LAB — CMP (CANCER CENTER ONLY)
ALT(SGPT): 30 U/L (ref 10–47)
AST: 33 U/L (ref 11–38)
Albumin: 3.4 g/dL (ref 3.3–5.5)
Alkaline Phosphatase: 77 U/L (ref 26–84)
BUN, Bld: 9 mg/dL (ref 7–22)
CALCIUM: 8.6 mg/dL (ref 8.0–10.3)
CO2: 30 meq/L (ref 18–33)
Chloride: 91 mEq/L — ABNORMAL LOW (ref 98–108)
Creat: 0.8 mg/dl (ref 0.6–1.2)
GLUCOSE: 102 mg/dL (ref 73–118)
POTASSIUM: 3.3 meq/L (ref 3.3–4.7)
Sodium: 134 mEq/L (ref 128–145)
Total Bilirubin: 0.7 mg/dl (ref 0.20–1.60)
Total Protein: 6 g/dL — ABNORMAL LOW (ref 6.4–8.1)

## 2017-01-16 MED ORDER — SODIUM CHLORIDE 0.9% FLUSH
10.0000 mL | INTRAVENOUS | Status: DC | PRN
  Administered 2017-01-16: 10 mL via INTRAVENOUS
  Filled 2017-01-16: qty 10

## 2017-01-16 MED ORDER — HEPARIN SOD (PORK) LOCK FLUSH 100 UNIT/ML IV SOLN
500.0000 [IU] | Freq: Once | INTRAVENOUS | Status: AC | PRN
  Administered 2017-01-16: 500 [IU] via INTRAVENOUS
  Filled 2017-01-16: qty 5

## 2017-01-16 NOTE — Progress Notes (Signed)
Hematology and Oncology Follow Up Visit  Bradley Meadows [id number] 07/04/1940 76 y.o. 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 He continues to do well and has tolerated treatment with Durvalumab nicely. Chloride today is 91 but he does not want fluids. He would rather hydrate himself at home.   He is still quite hoarse. He states his voice has been this way since radiation. He has no trouble swallowing.  He has a good appetite. His weight is stable.  No fever, chills, n/v, cough, rash, dizziness, SOB, chest pain, palpitations, abdominal pain or changes in bowel or bladder habits.   No swelling, tenderness, numbness or tingling in her extremities. No c/o pain.   ECOG Performance Status: 1 - Symptomatic but completely ambulatory  Medications:  Allergies as of 01/16/2017      Reactions   No Known Allergies       Medication List        Accurate as of 01/16/17  4:04 PM. Always use your most recent med list.          atorvastatin 40 MG tablet Commonly known as:  LIPITOR TAKE 1 TABLET(40 MG) BY MOUTH DAILY   CARAFATE 1 GM/10ML suspension Generic drug:  sucralfate Take 10 mLs (1 g total) 2 (two) times daily by mouth.   HYDROcodone-acetaminophen 5-325 MG tablet Commonly known as:  NORCO/VICODIN Take 1 tablet by mouth 2 (two) times daily as needed for moderate pain.   ipratropium-albuterol 0.5-2.5 (3) MG/3ML Soln Commonly known as:  DUONEB USE 1 VIAL VIA NEBULIZER EVERY 6 HOURS AS NEEDED FOR SHORTNESS OF BREATH OR WHEEZING   COMBIVENT RESPIMAT 20-100 MCG/ACT Aers respimat Generic drug:  Ipratropium-Albuterol INHALE 1 PUFF BY MOUTH EVERY 6 HOURS AS NEEDED FOR  WHEEZING   metoprolol tartrate 25 MG tablet Commonly known as:  LOPRESSOR TAKE 1 TABLET(25 MG) BY MOUTH TWICE DAILY   multivitamin tablet Take 1 tablet by mouth daily. Centrum Silver   omeprazole 20 MG capsule Commonly known as:  PRILOSEC TAKE ONE CAPSULE BY MOUTH EVERY DAY   STIOLTO RESPIMAT 2.5-2.5 MCG/ACT Aers Generic drug:  Tiotropium Bromide-Olodaterol INHALE 2 PUFFS BY MOUTH DAILY       Allergies:  Allergies  Allergen Reactions  . No Known Allergies     Past Medical History, Surgical history, Social history, and Family History were reviewed and updated.  Review of Systems: All other 10 point review of systems is negative.   Physical Exam:  weight is 186 lb (84.4 kg). His oral temperature is 98.2 F (36.8 C). His blood pressure is 112/65 and his pulse is 61. His respiration is 18 and oxygen saturation is 98%.   Wt Readings from Last 3 Encounters:  01/16/17 186 lb (84.4 kg)  01/09/17 173 lb 4 oz (78.6 kg)  12/11/16 188 lb (85.3 kg)    Ocular: Sclerae unicteric, pupils equal, round and reactive to light Ear-nose-throat: Oropharynx clear, dentition fair Lymphatic: No cervical, supraclavicular or axillary adenopathy Lungs no rales or rhonchi, good excursion bilaterally Heart regular rate and rhythm, no murmur appreciated Abd soft, nontender, positive bowel sounds, no liver or spleen tip palpated on  exam, no fluid wave  MSK no focal spinal tenderness, no joint edema Neuro: non-focal, well-oriented, appropriate affect Breasts: Deferred   Lab Results  Component Value Date   WBC 5.9 01/16/2017   HGB 12.4 (L) 01/16/2017   HCT 35.8 (L) 01/16/2017   MCV 91 01/16/2017   PLT 175 01/16/2017   Lab Results  Component Value Date   IRON 66 06/02/2012   TIBC 378 06/02/2012   UIBC 312 06/02/2012   IRONPCTSAT 17 (L) 06/02/2012   Lab Results  Component Value Date   RBC 3.92 (L) 01/16/2017   No results found for: KPAFRELGTCHN, LAMBDASER, KAPLAMBRATIO No results  found for: IGGSERUM, IGA, IGMSERUM No results found for: Odetta Pink, SPEI   Chemistry      Component Value Date/Time   NA 134 01/16/2017 1133   NA 130 (L) 01/09/2017 1039   K 3.3 01/16/2017 1133   K 3.6 01/09/2017 1039   CL 91 (L) 01/16/2017 1133   CO2 30 01/16/2017 1133   CO2 25 01/09/2017 1039   BUN 9 01/16/2017 1133   BUN 9.9 01/09/2017 1039   CREATININE 0.8 01/16/2017 1133   CREATININE 0.8 01/09/2017 1039      Component Value Date/Time   CALCIUM 8.6 01/16/2017 1133   CALCIUM 9.0 01/09/2017 1039   ALKPHOS 77 01/16/2017 1133   ALKPHOS 81 01/09/2017 1039   AST 33 01/16/2017 1133   AST 28 01/09/2017 1039   ALT 30 01/16/2017 1133   ALT 18 01/09/2017 1039   BILITOT 0.70 01/16/2017 1133   BILITOT 0.66 01/09/2017 1039      Impression and Plan: Bradley Meadows is a very pleasant 76 yo caucasian gentleman with 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 He is tolerating treatment with durvalumab nicely so far.  His only issue at this time is hydration. He did not want IV fluids today and promises to hydrate better at home.  He will be treated next week at [facility]. We will plan to see him back again in another 3 weeks for follow-up.  Both he and his daughter know to contact our office with any questions or concerns. We can certainly see him sooner if need be.   Laverna Peace, NP 12/14/20184:04 PM

## 2017-01-22 ENCOUNTER — Other Ambulatory Visit: Payer: Self-pay | Admitting: *Deleted

## 2017-01-22 DIAGNOSIS — C3491 Malignant neoplasm of unspecified part of right bronchus or lung: Secondary | ICD-10-CM

## 2017-01-23 ENCOUNTER — Ambulatory Visit (HOSPITAL_BASED_OUTPATIENT_CLINIC_OR_DEPARTMENT_OTHER): Payer: Medicare Other

## 2017-01-23 ENCOUNTER — Ambulatory Visit: Payer: Medicare Other

## 2017-01-23 ENCOUNTER — Other Ambulatory Visit: Payer: Self-pay | Admitting: Hematology & Oncology

## 2017-01-23 ENCOUNTER — Other Ambulatory Visit (HOSPITAL_BASED_OUTPATIENT_CLINIC_OR_DEPARTMENT_OTHER): Payer: Medicare Other

## 2017-01-23 VITALS — BP 130/71 | HR 65 | Temp 97.8°F | Resp 18

## 2017-01-23 DIAGNOSIS — Z95828 Presence of other vascular implants and grafts: Secondary | ICD-10-CM

## 2017-01-23 DIAGNOSIS — C772 Secondary and unspecified malignant neoplasm of intra-abdominal lymph nodes: Secondary | ICD-10-CM | POA: Diagnosis not present

## 2017-01-23 DIAGNOSIS — Z5112 Encounter for antineoplastic immunotherapy: Secondary | ICD-10-CM

## 2017-01-23 DIAGNOSIS — C3491 Malignant neoplasm of unspecified part of right bronchus or lung: Secondary | ICD-10-CM | POA: Diagnosis not present

## 2017-01-23 LAB — COMPREHENSIVE METABOLIC PANEL
ALT: 27 U/L (ref 0–55)
ANION GAP: 9 meq/L (ref 3–11)
AST: 39 U/L — ABNORMAL HIGH (ref 5–34)
Albumin: 3.6 g/dL (ref 3.5–5.0)
Alkaline Phosphatase: 80 U/L (ref 40–150)
BUN: 12.4 mg/dL (ref 7.0–26.0)
CHLORIDE: 93 meq/L — AB (ref 98–109)
CO2: 26 meq/L (ref 22–29)
Calcium: 8.9 mg/dL (ref 8.4–10.4)
Creatinine: 0.8 mg/dL (ref 0.7–1.3)
GLUCOSE: 107 mg/dL (ref 70–140)
POTASSIUM: 3.4 meq/L — AB (ref 3.5–5.1)
SODIUM: 129 meq/L — AB (ref 136–145)
Total Bilirubin: 0.51 mg/dL (ref 0.20–1.20)
Total Protein: 6 g/dL — ABNORMAL LOW (ref 6.4–8.3)

## 2017-01-23 LAB — CBC WITH DIFFERENTIAL/PLATELET
BASO%: 0.4 % (ref 0.0–2.0)
BASOS ABS: 0 10*3/uL (ref 0.0–0.1)
EOS ABS: 0.3 10*3/uL (ref 0.0–0.5)
EOS%: 4.1 % (ref 0.0–7.0)
HCT: 35.6 % — ABNORMAL LOW (ref 38.4–49.9)
HEMOGLOBIN: 12.5 g/dL — AB (ref 13.0–17.1)
LYMPH#: 1 10*3/uL (ref 0.9–3.3)
LYMPH%: 14.5 % (ref 14.0–49.0)
MCH: 31.8 pg (ref 27.2–33.4)
MCHC: 35.1 g/dL (ref 32.0–36.0)
MCV: 90.6 fL (ref 79.3–98.0)
MONO#: 0.5 10*3/uL (ref 0.1–0.9)
MONO%: 7.7 % (ref 0.0–14.0)
NEUT%: 73.3 % (ref 39.0–75.0)
NEUTROS ABS: 5.2 10*3/uL (ref 1.5–6.5)
NRBC: 0 % (ref 0–0)
Platelets: 157 10*3/uL (ref 140–400)
RBC: 3.93 10*6/uL — AB (ref 4.20–5.82)
RDW: 14.3 % (ref 11.0–14.6)
WBC: 7.1 10*3/uL (ref 4.0–10.3)

## 2017-01-23 MED ORDER — SODIUM CHLORIDE 0.9 % IV SOLN
Freq: Once | INTRAVENOUS | Status: AC
  Administered 2017-01-23: 13:00:00 via INTRAVENOUS

## 2017-01-23 MED ORDER — SODIUM CHLORIDE 0.9% FLUSH
10.0000 mL | INTRAVENOUS | Status: DC | PRN
  Administered 2017-01-23: 10 mL
  Filled 2017-01-23: qty 10

## 2017-01-23 MED ORDER — SODIUM CHLORIDE 0.9 % IV SOLN
10.6000 mg/kg | Freq: Once | INTRAVENOUS | Status: AC
  Administered 2017-01-23: 860 mg via INTRAVENOUS
  Filled 2017-01-23: qty 10

## 2017-01-23 MED ORDER — SODIUM CHLORIDE 0.9% FLUSH
10.0000 mL | INTRAVENOUS | Status: DC | PRN
  Administered 2017-01-23: 10 mL via INTRAVENOUS
  Filled 2017-01-23: qty 10

## 2017-01-23 MED ORDER — HEPARIN SOD (PORK) LOCK FLUSH 100 UNIT/ML IV SOLN
500.0000 [IU] | Freq: Once | INTRAVENOUS | Status: AC | PRN
  Administered 2017-01-23: 500 [IU]
  Filled 2017-01-23: qty 5

## 2017-01-23 NOTE — Patient Instructions (Signed)
[facility] Discharge Instructions for Patients Receiving Chemotherapy  Today you received the following chemotherapy agents: Imfinzi.  To help prevent nausea and vomiting after your treatment, we encourage you to take your nausea medication as directed.   If you develop nausea and vomiting that is not controlled by your nausea medication, call the clinic.   BELOW ARE SYMPTOMS THAT SHOULD BE REPORTED IMMEDIATELY:  *FEVER GREATER THAN 100.5 F  *CHILLS WITH OR WITHOUT FEVER  NAUSEA AND VOMITING THAT IS NOT CONTROLLED WITH YOUR NAUSEA MEDICATION  *UNUSUAL SHORTNESS OF BREATH  *UNUSUAL BRUISING OR BLEEDING  TENDERNESS IN MOUTH AND THROAT WITH OR WITHOUT PRESENCE OF ULCERS  *URINARY PROBLEMS  *BOWEL PROBLEMS  UNUSUAL RASH Items with * indicate a potential emergency and should be followed up as soon as possible.  Feel free to call the clinic you have any questions or concerns. The clinic phone number is [phone].  Please show the [place] at check-in to the Emergency Department and triage nurse.

## 2017-01-24 ENCOUNTER — Other Ambulatory Visit: Payer: Self-pay | Admitting: Family Medicine

## 2017-01-30 ENCOUNTER — Other Ambulatory Visit: Payer: Self-pay | Admitting: Family Medicine

## 2017-01-30 MED ORDER — HYDROCODONE-ACETAMINOPHEN 5-325 MG PO TABS: 1.0000 | ORAL_TABLET | Freq: Two times a day (BID) | ORAL | 0 refills | Status: DC | PRN

## 2017-01-30 NOTE — Telephone Encounter (Signed)
Pt needs refill on hydrocodone called in to [facility].

## 2017-01-30 NOTE — Telephone Encounter (Signed)
Patient is requesting a refill on Hydrocodone   Requesting refill      LOV: 12/11/16  LRF:  11/28/16

## 2017-02-05 ENCOUNTER — Other Ambulatory Visit: Payer: Medicare Other

## 2017-02-05 ENCOUNTER — Ambulatory Visit: Payer: Medicare Other | Admitting: Hematology & Oncology

## 2017-02-06 ENCOUNTER — Other Ambulatory Visit: Payer: Medicare Other

## 2017-02-06 ENCOUNTER — Ambulatory Visit: Payer: Medicare Other

## 2017-02-06 ENCOUNTER — Other Ambulatory Visit: Payer: Self-pay

## 2017-02-06 ENCOUNTER — Ambulatory Visit (HOSPITAL_BASED_OUTPATIENT_CLINIC_OR_DEPARTMENT_OTHER): Payer: Medicare Other | Admitting: Hematology & Oncology

## 2017-02-06 ENCOUNTER — Ambulatory Visit (HOSPITAL_BASED_OUTPATIENT_CLINIC_OR_DEPARTMENT_OTHER): Payer: Medicare Other

## 2017-02-06 ENCOUNTER — Other Ambulatory Visit (HOSPITAL_BASED_OUTPATIENT_CLINIC_OR_DEPARTMENT_OTHER): Payer: Medicare Other

## 2017-02-06 ENCOUNTER — Encounter: Payer: Self-pay | Admitting: Hematology & Oncology

## 2017-02-06 VITALS — BP 133/67 | HR 64

## 2017-02-06 VITALS — BP 128/64 | HR 70 | Temp 98.3°F | Resp 20 | Wt 184.0 lb

## 2017-02-06 DIAGNOSIS — E878 Other disorders of electrolyte and fluid balance, not elsewhere classified: Secondary | ICD-10-CM | POA: Diagnosis not present

## 2017-02-06 DIAGNOSIS — Z95828 Presence of other vascular implants and grafts: Secondary | ICD-10-CM

## 2017-02-06 DIAGNOSIS — Z5112 Encounter for antineoplastic immunotherapy: Secondary | ICD-10-CM

## 2017-02-06 DIAGNOSIS — C3491 Malignant neoplasm of unspecified part of right bronchus or lung: Secondary | ICD-10-CM

## 2017-02-06 DIAGNOSIS — J449 Chronic obstructive pulmonary disease, unspecified: Secondary | ICD-10-CM

## 2017-02-06 LAB — CMP (CANCER CENTER ONLY)
ALT(SGPT): 43 U/L (ref 10–47)
AST: 54 U/L — ABNORMAL HIGH (ref 11–38)
Albumin: 3.5 g/dL (ref 3.3–5.5)
Alkaline Phosphatase: 79 U/L (ref 26–84)
BUN, Bld: 8 mg/dL (ref 7–22)
CHLORIDE: 90 meq/L — AB (ref 98–108)
CO2: 29 mEq/L (ref 18–33)
Calcium: 9.3 mg/dL (ref 8.0–10.3)
Creat: 0.9 mg/dl (ref 0.6–1.2)
GLUCOSE: 104 mg/dL (ref 73–118)
POTASSIUM: 3.9 meq/L (ref 3.3–4.7)
Sodium: 133 mEq/L (ref 128–145)
Total Bilirubin: 0.9 mg/dl (ref 0.20–1.60)
Total Protein: 6.3 g/dL — ABNORMAL LOW (ref 6.4–8.1)

## 2017-02-06 LAB — CBC WITH DIFFERENTIAL (CANCER CENTER ONLY)
BASO#: 0 10*3/uL (ref 0.0–0.2)
BASO%: 0.3 % (ref 0.0–2.0)
EOS ABS: 0.1 10*3/uL (ref 0.0–0.5)
EOS%: 2 % (ref 0.0–7.0)
HEMATOCRIT: 35.7 % — AB (ref 38.7–49.9)
HGB: 12.6 g/dL — ABNORMAL LOW (ref 13.0–17.1)
LYMPH#: 0.7 10*3/uL — ABNORMAL LOW (ref 0.9–3.3)
LYMPH%: 9.9 % — AB (ref 14.0–48.0)
MCH: 31.9 pg (ref 28.0–33.4)
MCHC: 35.3 g/dL (ref 32.0–35.9)
MCV: 90 fL (ref 82–98)
MONO#: 0.7 10*3/uL (ref 0.1–0.9)
MONO%: 10.6 % (ref 0.0–13.0)
NEUT#: 5.4 10*3/uL (ref 1.5–6.5)
NEUT%: 77.2 % (ref 40.0–80.0)
PLATELETS: 179 10*3/uL (ref 145–400)
RBC: 3.95 10*6/uL — AB (ref 4.20–5.70)
RDW: 13.5 % (ref 11.1–15.7)
WBC: 7 10*3/uL (ref 4.0–10.0)

## 2017-02-06 MED ORDER — SODIUM CHLORIDE 0.9 % IV SOLN
INTRAVENOUS | Status: AC
  Administered 2017-02-06: 09:00:00 via INTRAVENOUS

## 2017-02-06 MED ORDER — SODIUM CHLORIDE 0.9% FLUSH
10.0000 mL | INTRAVENOUS | Status: DC | PRN
  Administered 2017-02-06: 10 mL
  Filled 2017-02-06: qty 10

## 2017-02-06 MED ORDER — SODIUM CHLORIDE 0.9 % IV SOLN
Freq: Once | INTRAVENOUS | Status: AC
  Administered 2017-02-06: 09:00:00 via INTRAVENOUS

## 2017-02-06 MED ORDER — DURVALUMAB 500 MG/10ML IV SOLN
860.0000 mg | Freq: Once | INTRAVENOUS | Status: AC
  Administered 2017-02-06: 860 mg via INTRAVENOUS
  Filled 2017-02-06: qty 7.2

## 2017-02-06 MED ORDER — SODIUM CHLORIDE 0.9% FLUSH
10.0000 mL | INTRAVENOUS | Status: DC | PRN
  Administered 2017-02-06: 10 mL via INTRAVENOUS
  Filled 2017-02-06: qty 10

## 2017-02-06 MED ORDER — HEPARIN SOD (PORK) LOCK FLUSH 100 UNIT/ML IV SOLN
500.0000 [IU] | Freq: Once | INTRAVENOUS | Status: AC | PRN
  Administered 2017-02-06: 500 [IU]
  Filled 2017-02-06: qty 5

## 2017-02-06 NOTE — Progress Notes (Signed)
Hematology and Oncology Follow Up Visit  Bradley Meadows [id number] July 25, 1940 77 y.o. 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  He got through the holidays without any difficulty.  He made through the snowstorm.  He was stuck inside for a few days.  He is still quite hoarse.  We will have to see if this can be fixed.  I will speak with otolaryngology to see if they can help [place] out.  He might need some Teflon injection into the vocal cord.  I do think another PET scan would be worthwhile.  His last PET scan was back in July.  He has had no problems with increased cough or shortness of breath.  He does wear oxygen on occasion.  He does have underlying COPD.  He has had no issues with bleeding.  He has had no diarrhea or constipation.  He has had no leg swelling.  Overall, his performance status is ECOG 1.   Medications:  Allergies as of 02/06/2017      Reactions   No Known Allergies       Medication List        Accurate as of 02/06/17  9:00 AM. Always use your most recent med list.          atorvastatin 40 MG tablet Commonly known as:  LIPITOR TAKE 1 TABLET(40 MG) BY MOUTH DAILY   CARAFATE 1 GM/10ML suspension Generic drug:  sucralfate Take 10 mLs (1 g total) 2 (two) times daily by mouth.   HYDROcodone-acetaminophen 5-325 MG tablet Commonly known as:  NORCO/VICODIN Take 1 tablet by mouth 2 (two) times daily as needed for moderate pain.   ipratropium-albuterol 0.5-2.5 (3) MG/3ML Soln Commonly known as:  DUONEB USE 1 VIAL VIA NEBULIZER EVERY 6 HOURS AS NEEDED FOR SHORTNESS OF BREATH OR WHEEZING   COMBIVENT RESPIMAT 20-100 MCG/ACT Aers respimat Generic drug:   Ipratropium-Albuterol INHALE 1 PUFF BY MOUTH EVERY 6 HOURS AS NEEDED FOR WHEEZING   metoprolol tartrate 25 MG tablet Commonly known as:  LOPRESSOR TAKE 1 TABLET(25 MG) BY MOUTH TWICE DAILY   multivitamin tablet Take 1 tablet by mouth daily. Centrum Silver   omeprazole 20 MG capsule Commonly known as:  PRILOSEC TAKE ONE CAPSULE BY MOUTH EVERY DAY   STIOLTO RESPIMAT 2.5-2.5 MCG/ACT Aers Generic drug:  Tiotropium Bromide-Olodaterol INHALE 2 PUFFS BY MOUTH DAILY   triamterene-hydrochlorothiazide 37.5-25 MG tablet Commonly known as:  MAXZIDE-25 TK 1 T PO QD FOR 3 DAYS PRF LEG SWELLING       Allergies:  Allergies  Allergen Reactions  . No Known Allergies     Past Medical History, Surgical history, Social history, and Family History were reviewed and updated.  Review of Systems: Review of Systems  HENT: Positive for sore throat.        He has hoarseness.  Eyes: Negative.   Respiratory: Positive for shortness of breath.   Cardiovascular: Negative.   Gastrointestinal: Positive for heartburn.  Genitourinary: Negative.   Musculoskeletal: Positive for neck pain.  Skin: Negative.   Neurological: Positive for weakness.  Endo/Heme/Allergies: Negative.   Psychiatric/Behavioral: Negative.      Physical Exam:  weight is 184 lb (83.5 kg). His oral temperature is 98.3 F (36.8 C). His blood pressure is 128/64 and his pulse is 70. His respiration is 20 and oxygen saturation is 100%.   Wt Readings from Last 3 Encounters:  02/06/17 184 lb (83.5 kg)  01/16/17 186 lb (84.4 kg)  01/09/17 173 lb 4 oz (78.6 kg)    Physical Exam  Constitutional: He is oriented to person, place, and time.  HENT:  Head: Normocephalic and atraumatic.  Mouth/Throat: Oropharynx is clear and moist.  Eyes: EOM are normal. Pupils are equal, round, and reactive to light.  Neck: Normal range of motion.  Cardiovascular: Normal rate, regular rhythm and normal heart sounds.  Pulmonary/Chest: Effort normal  and breath sounds normal.  Abdominal: Soft. Bowel sounds are normal.  Musculoskeletal: Normal range of motion. He exhibits no edema, tenderness or deformity.  Lymphadenopathy:    He has no cervical adenopathy.  Neurological: He is alert and oriented to person, place, and time.  Skin: Skin is warm and dry. No rash noted. No erythema.  Psychiatric: He has a normal mood and affect. His behavior is normal. Judgment and thought content normal.  Vitals reviewed.   Lab Results  Component Value Date   WBC 7.0 02/06/2017   HGB 12.6 (L) 02/06/2017   HCT 35.7 (L) 02/06/2017   MCV 90 02/06/2017   PLT 179 02/06/2017   Lab Results  Component Value Date   IRON 66 06/02/2012   TIBC 378 06/02/2012   UIBC 312 06/02/2012   IRONPCTSAT 17 (L) 06/02/2012   Lab Results  Component Value Date   RBC 3.95 (L) 02/06/2017   No results found for: KPAFRELGTCHN, LAMBDASER, KAPLAMBRATIO No results found for: IGGSERUM, IGA, IGMSERUM No results found for: Odetta Pink, SPEI   Chemistry      Component Value Date/Time   NA 133 02/06/2017 0757   NA 129 (L) 01/23/2017 1050   K 3.9 02/06/2017 0757   K 3.4 (L) 01/23/2017 1050   CL 90 (L) 02/06/2017 0757   CO2 29 02/06/2017 0757   CO2 26 01/23/2017 1050   BUN 8 02/06/2017 0757   BUN 12.4 01/23/2017 1050   CREATININE 0.9 02/06/2017 0757   CREATININE 0.8 01/23/2017 1050      Component Value Date/Time   CALCIUM 9.3 02/06/2017 0757   CALCIUM 8.9 01/23/2017 1050   ALKPHOS 79 02/06/2017 0757   ALKPHOS 80 01/23/2017 1050   AST 54 (H) 02/06/2017 0757   AST 39 (H) 01/23/2017 1050   ALT 43 02/06/2017 0757   ALT 27 01/23/2017 1050   BILITOT 0.90 02/06/2017 0757   BILITOT 0.51 01/23/2017 1050      Impression and Plan: Bradley Meadows is a very pleasant 77 yo caucasian gentleman with 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  He now is on maintenance therapy with durvalumab.  He has 1 year  of therapy.  He started this back in early November.  I would like to get a PET scan on him.  I think this would be helpful with respect to any management decisions.  I do think that he needs to be seen by ENT and see about Teflon injections of the vocal cord.  I taught him about this.  He is in agreement.  I think that he does need some IV fluids.  His chloride is quite low.    I will plan to see him back in about 6 weeks.  He gets his other treatments in between at [facility].   Volanda Napoleon, MD 1/4/20199:00 AM

## 2017-02-06 NOTE — Patient Instructions (Signed)
[facility] Discharge Instructions for Patients Receiving Chemotherapy  Today you received the following chemotherapy agents Imfinzi  To help prevent nausea and vomiting after your treatment, we encourage you to take your nausea medication as prescribed.   If you develop nausea and vomiting that is not controlled by your nausea medication, call the clinic.   BELOW ARE SYMPTOMS THAT SHOULD BE REPORTED IMMEDIATELY:  *FEVER GREATER THAN 100.5 F  *CHILLS WITH OR WITHOUT FEVER  NAUSEA AND VOMITING THAT IS NOT CONTROLLED WITH YOUR NAUSEA MEDICATION  *UNUSUAL SHORTNESS OF BREATH  *UNUSUAL BRUISING OR BLEEDING  TENDERNESS IN MOUTH AND THROAT WITH OR WITHOUT PRESENCE OF ULCERS  *URINARY PROBLEMS  *BOWEL PROBLEMS  UNUSUAL RASH Items with * indicate a potential emergency and should be followed up as soon as possible.  Feel free to call the clinic should you have any questions or concerns. The clinic phone number is [phone].  Please show the [facility] at check-in to the Emergency Department and triage nurse.    Dehydration, Adult Dehydration is a condition in which there is not enough fluid or water in the body. This happens when you lose more fluids than you take in. Important organs, such as the kidneys, brain, and heart, cannot function without a proper amount of fluids. Any loss of fluids from the body can lead to dehydration. Dehydration can range from mild to severe. This condition should be treated right away to prevent it from becoming severe. What are the causes? This condition may be caused by:  Vomiting.  Diarrhea.  Excessive sweating, such as from heat exposure or exercise.  Not drinking enough fluid, especially: ? When ill. ? While doing activity that requires a lot of energy.  Excessive urination.  Fever.  Infection.  Certain medicines, such as medicines that cause the body to lose excess fluid (diuretics).  Inability to access  safe drinking water.  Reduced physical ability to get adequate water and food.  What increases the risk? This condition is more likely to develop in people:  Who have a poorly controlled long-term (chronic) illness, such as diabetes, heart disease, or kidney disease.  Who are age 25 or older.  Who are disabled.  Who live in a place with high altitude.  Who play endurance sports.  What are the signs or symptoms? Symptoms of mild dehydration may include:  Thirst.  Dry lips.  Slightly dry mouth.  Dry, warm skin.  Dizziness. Symptoms of moderate dehydration may include:  Very dry mouth.  Muscle cramps.  Dark urine. Urine may be the color of tea.  Decreased urine production.  Decreased tear production.  Heartbeat that is irregular or faster than normal (palpitations).  Headache.  Light-headedness, especially when you stand up from a sitting position.  Fainting (syncope). Symptoms of severe dehydration may include:  Changes in skin, such as: ? Cold and clammy skin. ? Blotchy (mottled) or pale skin. ? Skin that does not quickly return to normal after being lightly pinched and released (poor skin turgor).  Changes in body fluids, such as: ? Extreme thirst. ? No tear production. ? Inability to sweat when body temperature is high, such as in hot weather. ? Very little urine production.  Changes in vital signs, such as: ? Weak pulse. ? Pulse that is more than 100 beats a minute when sitting still. ? Rapid breathing. ? Low blood pressure.  Other changes, such as: ? Sunken eyes. ? Cold hands and feet. ? Confusion. ?  Lack of energy (lethargy). ? Difficulty waking up from sleep. ? Short-term weight loss. ? Unconsciousness. How is this diagnosed? This condition is diagnosed based on your symptoms and a physical exam. Blood and urine tests may be done to help confirm the diagnosis. How is this treated? Treatment for this condition depends on the severity.  Mild or moderate dehydration can often be treated at home. Treatment should be started right away. Do not wait until dehydration becomes severe. Severe dehydration is an emergency and it needs to be treated in a hospital. Treatment for mild dehydration may include:  Drinking more fluids.  Replacing salts and minerals in your blood (electrolytes) that you may have lost. Treatment for moderate dehydration may include:  Drinking an oral rehydration solution (ORS). This is a drink that helps you replace fluids and electrolytes (rehydrate). It can be found at pharmacies and retail stores. Treatment for severe dehydration may include:  Receiving fluids through an IV tube.  Receiving an electrolyte solution through a feeding tube that is passed through your nose and into your stomach (nasogastric tube, or NG tube).  Correcting any abnormalities in electrolytes.  Treating the underlying cause of dehydration. Follow these instructions at home:  If directed by your health care provider, drink an ORS: ? Make an ORS by following instructions on the package. ? Start by drinking small amounts, about  cup (120 mL) every 5-10 minutes. ? Slowly increase how much you drink until you have taken the amount recommended by your health care provider.  Drink enough clear fluid to keep your urine clear or pale yellow. If you were told to drink an ORS, finish the ORS first, then start slowly drinking other clear fluids. Drink fluids such as: ? Water. Do not drink only water. Doing that can lead to having too little salt (sodium) in the body (hyponatremia). ? Ice chips. ? Fruit juice that you have added water to (diluted fruit juice). ? Low-calorie sports drinks.  Avoid: ? Alcohol. ? Drinks that contain a lot of sugar. These include high-calorie sports drinks, fruit juice that is not diluted, and soda. ? Caffeine. ? Foods that are greasy or contain a lot of fat or sugar.  Take over-the-counter and  prescription medicines only as told by your health care provider.  Do not take sodium tablets. This can lead to having too much sodium in the body (hypernatremia).  Eat foods that contain a healthy balance of electrolytes, such as bananas, oranges, potatoes, tomatoes, and spinach.  Keep all follow-up visits as told by your health care provider. This is important. Contact a health care provider if:  You have abdominal pain that: ? Gets worse. ? Stays in one area (localizes).  You have a rash.  You have a stiff neck.  You are more irritable than usual.  You are sleepier or more difficult to wake up than usual.  You feel weak or dizzy.  You feel very thirsty.  You have urinated only a small amount of very dark urine over 6-8 hours. Get help right away if:  You have symptoms of severe dehydration.  You cannot drink fluids without vomiting.  Your symptoms get worse with treatment.  You have a fever.  You have a severe headache.  You have vomiting or diarrhea that: ? Gets worse. ? Does not go away.  You have blood or green matter (bile) in your vomit.  You have blood in your stool. This may cause stool to look black and tarry.  You have not urinated in 6-8 hours.  You faint.  Your heart rate while sitting still is over 100 beats a minute.  You have trouble breathing. This information is not intended to replace advice given to you by your health care provider. Make sure you discuss any questions you have with your health care provider. Document Released: 01/20/2005 Document Revised: 08/17/2015 Document Reviewed: 03/16/2015 Elsevier Interactive Patient Education  [employer].

## 2017-02-06 NOTE — Patient Instructions (Signed)
Implanted Port Home Guide An implanted port is a type of central line that is placed under the skin. Central lines are used to provide IV access when treatment or nutrition needs to be given through a person's veins. Implanted ports are used for long-term IV access. An implanted port may be placed because:  You need IV medicine that would be irritating to the small veins in your hands or arms.  You need long-term IV medicines, such as antibiotics.  You need IV nutrition for a long period.  You need frequent blood draws for lab tests.  You need dialysis.  Implanted ports are usually placed in the chest area, but they can also be placed in the upper arm, the abdomen, or the leg. An implanted port has two main parts:  Reservoir. The reservoir is round and will appear as a small, raised area under your skin. The reservoir is the part where a needle is inserted to give medicines or draw blood.  Catheter. The catheter is a thin, flexible tube that extends from the reservoir. The catheter is placed into a large vein. Medicine that is inserted into the reservoir goes into the catheter and then into the vein.  How will I care for my incision site? Do not get the incision site wet. Bathe or shower as directed by your health care provider. How is my port accessed? Special steps must be taken to access the port:  Before the port is accessed, a numbing cream can be placed on the skin. This helps numb the skin over the port site.  Your health care provider uses a sterile technique to access the port. ? Your health care provider must put on a mask and sterile gloves. ? The skin over your port is cleaned carefully with an antiseptic and allowed to dry. ? The port is gently pinched between sterile gloves, and a needle is inserted into the port.  Only "non-coring" port needles should be used to access the port. Once the port is accessed, a blood return should be checked. This helps ensure that the port  is in the vein and is not clogged.  If your port needs to remain accessed for a constant infusion, a clear (transparent) bandage will be placed over the needle site. The bandage and needle will need to be changed every week, or as directed by your health care provider.  Keep the bandage covering the needle clean and dry. Do not get it wet. Follow your health care provider's instructions on how to take a shower or bath while the port is accessed.  If your port does not need to stay accessed, no bandage is needed over the port.  What is flushing? Flushing helps keep the port from getting clogged. Follow your health care provider's instructions on how and when to flush the port. Ports are usually flushed with saline solution or a medicine called heparin. The need for flushing will depend on how the port is used.  If the port is used for intermittent medicines or blood draws, the port will need to be flushed: ? After medicines have been given. ? After blood has been drawn. ? As part of routine maintenance.  If a constant infusion is running, the port may not need to be flushed.  How long will my port stay implanted? The port can stay in for as long as your health care provider thinks it is needed. When it is time for the port to come out, surgery will be   done to remove it. The procedure is similar to the one performed when the port was put in. When should I seek immediate medical care? When you have an implanted port, you should seek immediate medical care if:  You notice a bad smell coming from the incision site.  You have swelling, redness, or drainage at the incision site.  You have more swelling or pain at the port site or the surrounding area.  You have a fever that is not controlled with medicine.  This information is not intended to replace advice given to you by your health care provider. Make sure you discuss any questions you have with your health care provider. Document  Released: 01/20/2005 Document Revised: 06/28/2015 Document Reviewed: 09/27/2012 Elsevier Interactive Patient Education  2017 Elsevier Inc.  

## 2017-02-18 ENCOUNTER — Ambulatory Visit (HOSPITAL_COMMUNITY): Payer: Medicare Other

## 2017-02-20 ENCOUNTER — Telehealth: Payer: Self-pay | Admitting: *Deleted

## 2017-02-20 ENCOUNTER — Other Ambulatory Visit: Payer: Medicare Other

## 2017-02-20 ENCOUNTER — Ambulatory Visit: Payer: Medicare Other

## 2017-02-20 ENCOUNTER — Emergency Department (HOSPITAL_COMMUNITY)
Admission: EM | Admit: 2017-02-20 | Discharge: 2017-02-21 | Disposition: A | Discharge status: Home / Self Care | Payer: Medicare Other | Attending: Emergency Medicine | Admitting: Emergency Medicine

## 2017-02-20 ENCOUNTER — Other Ambulatory Visit: Payer: Self-pay

## 2017-02-20 ENCOUNTER — Encounter (HOSPITAL_COMMUNITY): Payer: Self-pay

## 2017-02-20 DIAGNOSIS — R109 Unspecified abdominal pain: Secondary | ICD-10-CM | POA: Diagnosis not present

## 2017-02-20 DIAGNOSIS — Z5321 Procedure and treatment not carried out due to patient leaving prior to being seen by health care provider: Secondary | ICD-10-CM | POA: Insufficient documentation

## 2017-02-20 NOTE — Telephone Encounter (Signed)
Patient is scheduled today for [facility] but is being sent to the ED by the surgeon regarding complications with an inguinal hernia. She wanted the office to be aware, but also wanted to follow up on the appointment he would be missing.   Reviewed with Dr Marin Olp. He states patient is to skip today's dose regardless of treatment/interventions.   Patient's daughter is aware to cancel today's appointment, without the need to reschedule.

## 2017-02-20 NOTE — ED Triage Notes (Signed)
Patient c/o right inguinal hernia pain all week. Patient states it does not hurt currently, but is having increased swelling. Patient is currently receiving Immunotherapy for lung cancer.

## 2017-02-20 NOTE — Telephone Encounter (Addendum)
"  This is patient's daughter calling to let you all know he will not be able to make his 12:45 appointments today.  We're in the ER.  Has a protruding hernia for several days now.  Advised to come to ED to make sure surgery is not needed.  Will contact Dr. Marin Olp about rescheduling "  Infusion notified.

## 2017-02-25 ENCOUNTER — Other Ambulatory Visit: Payer: Self-pay

## 2017-02-25 ENCOUNTER — Inpatient Hospital Stay (HOSPITAL_COMMUNITY)
Admission: AD | Admit: 2017-02-25 | Discharge: 2017-02-27 | DRG: 351 | Disposition: A | Discharge status: Home / Self Care | Payer: Medicare Other | Source: Ambulatory Visit | Attending: General Surgery | Admitting: General Surgery

## 2017-02-25 ENCOUNTER — Encounter (HOSPITAL_COMMUNITY): Payer: Self-pay

## 2017-02-25 ENCOUNTER — Observation Stay (HOSPITAL_COMMUNITY): Payer: Medicare Other

## 2017-02-25 DIAGNOSIS — E785 Hyperlipidemia, unspecified: Secondary | ICD-10-CM | POA: Diagnosis not present

## 2017-02-25 DIAGNOSIS — E876 Hypokalemia: Secondary | ICD-10-CM | POA: Diagnosis present

## 2017-02-25 DIAGNOSIS — L8990 Pressure ulcer of unspecified site, unspecified stage: Secondary | ICD-10-CM

## 2017-02-25 DIAGNOSIS — J449 Chronic obstructive pulmonary disease, unspecified: Secondary | ICD-10-CM | POA: Diagnosis not present

## 2017-02-25 DIAGNOSIS — C3491 Malignant neoplasm of unspecified part of right bronchus or lung: Secondary | ICD-10-CM | POA: Diagnosis present

## 2017-02-25 DIAGNOSIS — Z87891 Personal history of nicotine dependence: Secondary | ICD-10-CM

## 2017-02-25 DIAGNOSIS — E871 Hypo-osmolality and hyponatremia: Secondary | ICD-10-CM | POA: Diagnosis present

## 2017-02-25 DIAGNOSIS — Z923 Personal history of irradiation: Secondary | ICD-10-CM

## 2017-02-25 DIAGNOSIS — J9611 Chronic respiratory failure with hypoxia: Secondary | ICD-10-CM | POA: Diagnosis not present

## 2017-02-25 DIAGNOSIS — K219 Gastro-esophageal reflux disease without esophagitis: Secondary | ICD-10-CM | POA: Diagnosis present

## 2017-02-25 DIAGNOSIS — E440 Moderate protein-calorie malnutrition: Secondary | ICD-10-CM | POA: Diagnosis present

## 2017-02-25 DIAGNOSIS — M1990 Unspecified osteoarthritis, unspecified site: Secondary | ICD-10-CM | POA: Diagnosis present

## 2017-02-25 DIAGNOSIS — Z9981 Dependence on supplemental oxygen: Secondary | ICD-10-CM

## 2017-02-25 DIAGNOSIS — Z803 Family history of malignant neoplasm of breast: Secondary | ICD-10-CM

## 2017-02-25 DIAGNOSIS — R109 Unspecified abdominal pain: Secondary | ICD-10-CM | POA: Diagnosis not present

## 2017-02-25 DIAGNOSIS — Z8349 Family history of other endocrine, nutritional and metabolic diseases: Secondary | ICD-10-CM

## 2017-02-25 DIAGNOSIS — I5042 Chronic combined systolic (congestive) and diastolic (congestive) heart failure: Secondary | ICD-10-CM | POA: Diagnosis not present

## 2017-02-25 DIAGNOSIS — Z8249 Family history of ischemic heart disease and other diseases of the circulatory system: Secondary | ICD-10-CM

## 2017-02-25 DIAGNOSIS — Z6825 Body mass index (BMI) 25.0-25.9, adult: Secondary | ICD-10-CM

## 2017-02-25 DIAGNOSIS — I2510 Atherosclerotic heart disease of native coronary artery without angina pectoris: Secondary | ICD-10-CM | POA: Diagnosis present

## 2017-02-25 DIAGNOSIS — I110 Hypertensive heart disease with heart failure: Secondary | ICD-10-CM | POA: Diagnosis present

## 2017-02-25 DIAGNOSIS — K4030 Unilateral inguinal hernia, with obstruction, without gangrene, not specified as recurrent: Secondary | ICD-10-CM

## 2017-02-25 DIAGNOSIS — E782 Mixed hyperlipidemia: Secondary | ICD-10-CM | POA: Diagnosis present

## 2017-02-25 DIAGNOSIS — D696 Thrombocytopenia, unspecified: Secondary | ICD-10-CM | POA: Diagnosis present

## 2017-02-25 DIAGNOSIS — Z825 Family history of asthma and other chronic lower respiratory diseases: Secondary | ICD-10-CM

## 2017-02-25 DIAGNOSIS — I10 Essential (primary) hypertension: Secondary | ICD-10-CM | POA: Diagnosis not present

## 2017-02-25 DIAGNOSIS — Z8261 Family history of arthritis: Secondary | ICD-10-CM

## 2017-02-25 DIAGNOSIS — R188 Other ascites: Secondary | ICD-10-CM | POA: Diagnosis present

## 2017-02-25 DIAGNOSIS — K5669 Other partial intestinal obstruction: Secondary | ICD-10-CM | POA: Diagnosis not present

## 2017-02-25 DIAGNOSIS — E860 Dehydration: Secondary | ICD-10-CM | POA: Diagnosis present

## 2017-02-25 DIAGNOSIS — Z79899 Other long term (current) drug therapy: Secondary | ICD-10-CM

## 2017-02-25 LAB — CBC WITH DIFFERENTIAL/PLATELET
BASOS ABS: 0 10*3/uL (ref 0.0–0.1)
BASOS PCT: 0 %
Eosinophils Absolute: 0 10*3/uL (ref 0.0–0.7)
Eosinophils Relative: 0 %
HEMATOCRIT: 40.2 % (ref 39.0–52.0)
HEMOGLOBIN: 14.1 g/dL (ref 13.0–17.0)
LYMPHS PCT: 9 %
Lymphs Abs: 0.4 10*3/uL — ABNORMAL LOW (ref 0.7–4.0)
MCH: 31.1 pg (ref 26.0–34.0)
MCHC: 35.1 g/dL (ref 30.0–36.0)
MCV: 88.7 fL (ref 78.0–100.0)
MONOS PCT: 18 %
Monocytes Absolute: 0.9 10*3/uL (ref 0.1–1.0)
NEUTROS ABS: 3.6 10*3/uL (ref 1.7–7.7)
NEUTROS PCT: 73 %
Platelets: 153 10*3/uL (ref 150–400)
RBC: 4.53 MIL/uL (ref 4.22–5.81)
RDW: 14.2 % (ref 11.5–15.5)
WBC: 4.9 10*3/uL (ref 4.0–10.5)

## 2017-02-25 LAB — LACTIC ACID, PLASMA
Lactic Acid, Venous: 1.4 mmol/L (ref 0.5–1.9)
Lactic Acid, Venous: 1.4 mmol/L (ref 0.5–1.9)

## 2017-02-25 LAB — COMPREHENSIVE METABOLIC PANEL
ALBUMIN: 3.5 g/dL (ref 3.5–5.0)
ALT: 23 U/L (ref 17–63)
AST: 23 U/L (ref 15–41)
Alkaline Phosphatase: 64 U/L (ref 38–126)
Anion gap: 12 (ref 5–15)
BUN: 29 mg/dL — AB (ref 6–20)
CHLORIDE: 88 mmol/L — AB (ref 101–111)
CO2: 28 mmol/L (ref 22–32)
Calcium: 8.7 mg/dL — ABNORMAL LOW (ref 8.9–10.3)
Creatinine, Ser: 0.69 mg/dL (ref 0.61–1.24)
GFR calc Af Amer: >60 mL/min (ref >60)
Glucose, Bld: 111 mg/dL — ABNORMAL HIGH (ref 65–99)
POTASSIUM: 3.3 mmol/L — AB (ref 3.5–5.1)
SODIUM: 128 mmol/L — AB (ref 135–145)
Total Bilirubin: 1.2 mg/dL (ref 0.3–1.2)
Total Protein: 6.3 g/dL — ABNORMAL LOW (ref 6.5–8.1)

## 2017-02-25 LAB — SURGICAL PCR SCREEN
MRSA, PCR: NEGATIVE
Staphylococcus aureus: NEGATIVE

## 2017-02-25 MED ORDER — KCL IN DEXTROSE-NACL 20-5-0.45 MEQ/L-%-% IV SOLN
INTRAVENOUS | Status: DC
  Administered 2017-02-25: 16:00:00 via INTRAVENOUS
  Filled 2017-02-25 (×2): qty 1000

## 2017-02-25 MED ORDER — CEFAZOLIN SODIUM-DEXTROSE 2-4 GM/100ML-% IV SOLN
2.0000 g | INTRAVENOUS | Status: AC
  Administered 2017-02-26: 2 g via INTRAVENOUS
  Filled 2017-02-25: qty 100

## 2017-02-25 MED ORDER — METOPROLOL TARTRATE 5 MG/5ML IV SOLN
5.0000 mg | Freq: Three times a day (TID) | INTRAVENOUS | Status: DC
  Administered 2017-02-25 – 2017-02-26 (×2): 5 mg via INTRAVENOUS
  Filled 2017-02-25 (×2): qty 5

## 2017-02-25 MED ORDER — FENTANYL CITRATE (PF) 100 MCG/2ML IJ SOLN
25.0000 ug | INTRAMUSCULAR | Status: DC | PRN
  Administered 2017-02-25 – 2017-02-26 (×4): 25 ug via INTRAVENOUS
  Filled 2017-02-25 (×4): qty 2

## 2017-02-25 MED ORDER — POTASSIUM CHLORIDE 2 MEQ/ML IV SOLN: INTRAVENOUS | Status: DC

## 2017-02-25 MED ORDER — FLUTICASONE PROPIONATE 50 MCG/ACT NA SUSP
1.0000 | Freq: Every day | NASAL | Status: DC
  Administered 2017-02-25 – 2017-02-27 (×3): 1 via NASAL
  Filled 2017-02-25: qty 16

## 2017-02-25 MED ORDER — IPRATROPIUM-ALBUTEROL 0.5-2.5 (3) MG/3ML IN SOLN: 3.0000 mL | Freq: Four times a day (QID) | RESPIRATORY_TRACT | Status: DC | PRN

## 2017-02-25 MED ORDER — METOPROLOL TARTRATE 5 MG/5ML IV SOLN: 5.0000 mg | Freq: Four times a day (QID) | INTRAVENOUS | Status: DC | PRN

## 2017-02-25 MED ORDER — KCL IN DEXTROSE-NACL 40-5-0.9 MEQ/L-%-% IV SOLN
INTRAVENOUS | Status: DC
  Administered 2017-02-25 – 2017-02-26 (×2): via INTRAVENOUS
  Filled 2017-02-25 (×3): qty 1000

## 2017-02-25 MED ORDER — ONDANSETRON 4 MG PO TBDP: 4.0000 mg | ORAL_TABLET | Freq: Four times a day (QID) | ORAL | Status: DC | PRN

## 2017-02-25 MED ORDER — DIAZEPAM 5 MG/ML IJ SOLN: 5.0000 mg | Freq: Three times a day (TID) | INTRAMUSCULAR | Status: DC | PRN

## 2017-02-25 MED ORDER — IPRATROPIUM-ALBUTEROL 20-100 MCG/ACT IN AERS: 1.0000 | INHALATION_SPRAY | Freq: Four times a day (QID) | RESPIRATORY_TRACT | Status: DC | PRN

## 2017-02-25 MED ORDER — ONDANSETRON HCL 4 MG/2ML IJ SOLN: 4.0000 mg | Freq: Four times a day (QID) | INTRAMUSCULAR | Status: DC | PRN

## 2017-02-25 MED ORDER — SODIUM CHLORIDE 0.9% FLUSH
10.0000 mL | INTRAVENOUS | Status: DC | PRN
  Administered 2017-02-27: 20 mL
  Filled 2017-02-25: qty 40

## 2017-02-25 MED ORDER — PANTOPRAZOLE SODIUM 40 MG IV SOLR
40.0000 mg | Freq: Every day | INTRAVENOUS | Status: DC
  Administered 2017-02-25: 40 mg via INTRAVENOUS
  Filled 2017-02-25: qty 40

## 2017-02-25 NOTE — H&P (Signed)
[facility] Admission Note  Bradley Meadows April 07, 1940  [id number].    Chief Complaint: Incarcerated inguinal hernia  HPI:  Patient has previously been managed in our practice by Dr. Jackolyn Confer. Patient had undergone left inguinal hernia repair under spinal anesthesia in August 2018. Patient has lung cancer and is being treated with immunotherapy under the direction of Dr. Burney Gauze. Patient's primary care physician is Dr. Margaretmary Eddy.  Patient has developed a right inguinal hernia which is becoming incarcerated. This happened approximately 5 days ago. He presented to the emergency Department at [facility] long hospital bedside in the waiting room for 4 hours and then left and went home. They contacted our office and the patient was scheduled to see Dr. Harlow Asa this morning. He is accompanied by his daughter. She works in a [employer].  Patient states that he has not had anything significant to eat or drink in 3 or 4 days. He is not having bowel movements. He has passed a small amount of gas. Hernia has remained incarcerated and is tender. He presents today to the office for evaluation and recommendations for management.  Patient has had prior exploratory laparotomy for perforated ulcer. This was through a midline incision. He also underwent repair of ventral incisional hernia. He had left inguinal hernia repair by Dr. Zella Richer in August as noted above. Patient does have COPD. He also has a history of coronary artery disease.  ROS: Review of Systems  Constitutional: Positive for chills and diaphoresis. Negative for fever.  Respiratory: Positive for shortness of breath and wheezing.   Cardiovascular: Negative for chest pain.  Gastrointestinal: Positive for abdominal pain, constipation and nausea. Negative for vomiting.  Genitourinary: Negative for dysuria, frequency and urgency.  All other systems reviewed and are negative.   Family History  Problem  Relation Age of Onset  . Arthritis Mother   . COPD Mother   . Alcohol abuse Father   . Breast cancer Sister   . Heart disease Paternal Grandfather   . Hyperlipidemia Sister     Past Medical History:  Diagnosis Date  . Arthritis   . Asthma   . CHF (congestive heart failure) (HCC)    ef=45-50%  . COPD (chronic obstructive pulmonary disease) ([place])    home O2  . Coronary artery disease    40-50% mLAD, 40% pRCA, 30% mRCA, occluded CX with retrograde filling 06/01/06 cath  . GERD (gastroesophageal reflux disease)    ulcers  . Goals of care, counseling/discussion 04/07/2016  . History of external beam radiation therapy 04/24/16-06/04/16   chest 60 Gy in 30 fractions  . Hyperlipidemia   . Hypertension   . 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 PVC's (premature ventricular contractions)    2017  . 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 ESOPHAGOGASTRODUODENOSCOPY (EGD) WITH PROPOFOL N/A 02/26/2016   Procedure: ESOPHAGOGASTRODUODENOSCOPY (EGD) WITH PROPOFOL;  Surgeon: Irene Shipper, MD;  Location: [facility];  Service: Endoscopy;  Laterality: N/A;  . EXPLORATORY LAPAROTOMY     for ulcer  "washed me out with a hose pipe"  . EYE SURGERY     cataracts  . HERNIA REPAIR    . INGUINAL HERNIA REPAIR Left 09/26/2016   Procedure: HERNIA REPAIR INGUINAL LEFT;  Surgeon: Jackolyn Confer, MD;  Location: [facility];  Service: General;  Laterality: Left;  With MESH  . IR GENERIC HISTORICAL  04/10/2016  IR US GUIDE VASC ACCESS RIGHT 04/10/2016 Markus Daft, MD [facility]  . IR GENERIC HISTORICAL  04/10/2016   IR FLUORO GUIDE PORT INSERTION RIGHT 04/10/2016 Markus Daft, MD [facility]  . IR GENERIC HISTORICAL  04/10/2016   IR FLUORO RM 30-60 MIN 04/10/2016 Markus Daft, MD [facility]  . IR GENERIC HISTORICAL  04/18/2016   IR FLUORO RM 30-60 MIN 04/18/2016 Greggory Keen, MD [facility]  . JOINT  REPLACEMENT     knee both  . PORTACATH PLACEMENT Right 04/10/2016  . VIDEO BRONCHOSCOPY WITH ENDOBRONCHIAL ULTRASOUND N/A 03/26/2016   Procedure: VIDEO BRONCHOSCOPY WITH ENDOBRONCHIAL ULTRASOUND;  Surgeon: Collene Gobble, MD;  Location: [facility];  Service: Thoracic;  Laterality: N/A;    Social History:  reports that he quit smoking about 16 months ago. His smoking use included cigarettes. He has a 45.00 pack-year smoking history. he has never used smokeless tobacco. He reports that he does not drink alcohol or use drugs.  Allergies:  Allergies  Allergen Reactions  . No Known Allergies     Medications Prior to Admission  Medication Sig Dispense Refill  . atorvastatin (LIPITOR) 40 MG tablet TAKE 1 TABLET(40 MG) BY MOUTH DAILY 90 tablet 0  . CARAFATE 1 GM/10ML suspension Take 10 mLs (1 g total) 2 (two) times daily by mouth. 420 mL 6  . COMBIVENT RESPIMAT 20-100 MCG/ACT AERS respimat INHALE 1 PUFF BY MOUTH EVERY 6 HOURS AS NEEDED FOR WHEEZING 4 g 5  . HYDROcodone-acetaminophen (NORCO/VICODIN) 5-325 MG tablet Take 1 tablet by mouth 2 (two) times daily as needed for moderate pain. 90 tablet 0  . ipratropium-albuterol (DUONEB) 0.5-2.5 (3) MG/3ML SOLN USE 1 VIAL VIA NEBULIZER EVERY 6 HOURS AS NEEDED FOR SHORTNESS OF BREATH OR WHEEZING 990 mL 0  . metoprolol tartrate (LOPRESSOR) 25 MG tablet TAKE 1 TABLET(25 MG) BY MOUTH TWICE DAILY 180 tablet 1  . Multiple Vitamin (MULTIVITAMIN) tablet Take 1 tablet by mouth daily. Centrum Silver    . omeprazole (PRILOSEC) 20 MG capsule TAKE ONE CAPSULE BY MOUTH EVERY DAY 90 capsule 1  . STIOLTO RESPIMAT 2.5-2.5 MCG/ACT AERS INHALE 2 PUFFS BY MOUTH DAILY 4 g 5  . triamterene-hydrochlorothiazide (MAXZIDE-25) 37.5-25 MG tablet TK 1 T PO QD FOR 3 DAYS PRF LEG SWELLING  3    Blood pressure (!) 159/86, pulse (!) 109, temperature 98.1 F (36.7 C), temperature source Oral, SpO2 95 %. Physical Exam: GENERAL APPEARANCE Weak, unstable gait  SKIN Rash, lesions, ulcers:  none Induration, erythema: none Nodules: none palpable  EYES Conjunctiva and lids: normal Pupils: equal and reactive Iris: normal bilaterally  EARS, NOSE, MOUTH, THROAT External ears: no lesion or deformity External nose: no lesion or deformity Hearing: grossly normal Lips: no lesion or deformity Dentition: normal for age Oral mucosa: moist  NECK Symmetric: yes Trachea: midline Thyroid: no palpable nodules in the thyroid bed Voice is hoarse  CHEST Respiratory effort: Some increased work of breathing Retraction or accessory muscle use: yes Breath sounds: normal bilaterally Rales, rhonchi, wheeze: Scattered rhonchi  CARDIOVASCULAR Auscultation: regular rhythm, normal rate Murmurs: none Pulses: carotid and radial pulse 2+ palpable  ABDOMEN Distension: Mild-to-moderate Masses: none palpable Tenderness: none Hepatosplenomegaly: not present Hernia: Well-healed incision left inguinal region with no sign of recurrent hernia. Obvious moderate to large bulge right groin, approximately 10-12 cm in diameter, tense, tender to palpation, not reducible.  GENITOURINARY Penis: no lesions Scrotum: no masses  MUSCULOSKELETAL Station and gait: Unsteady Digits and nails: Some clubbing of nails Muscle strength:  grossly normal all extremities Range of motion: grossly normal all extremities  LYMPHATIC Cervical: none palpable Supraclavicular: none palpable  PSYCHIATRIC Oriented to person, place, and time: yes Mood and affect: normal for situation Judgment and insight: appropriate for situation  No results found for this or any previous visit (from the past 48 hour(s)). No results found.    Assessment/Plan Squamous Cell Lung Cancer COPD CAD CHF HTN GERD HLD  Incarcerated R inguinal hernia  - medicine consult for MMP - Plan on hernia repair either today or tomorrow - pain control with fentanyl/valium for now - keep NPO, and consent patient - IVF - labs and KUB  pending  Dr. Excell Seltzer to see and make further recommendations, may need to discuss with Dr. Marin Olp who is patient's oncologist.   Bradley Meadows, [facility] 02/25/2017, 1:20 PM Pager: [phone] Mon-Fri 7:00 am-4:30 pm Sat-Sun 7:00 am-11:30 am

## 2017-02-25 NOTE — Progress Notes (Signed)
Patient approved of daughter being in the room during the admission process questioning

## 2017-02-25 NOTE — Consult Note (Signed)
[facility] Medical Consultation  Bradley Meadows DGU:[id number] DOB: [birth year] DOA: 02/25/2017 PCP: Susy Frizzle, MD   Requesting physician: Dr. Excell Seltzer  Date of consultation: 02/25/17 Reason for consultation: Assistance with management of medical problems.  Impression/Recommendations 1-incarcerated right inguinal hernia: -Continue n.p.o. status -Further recommendations on treatment as per primary service -Patient will most likely require surgical repair of his right inguinal hernia. -moderate to high risk given cormobidities and age; discussed with family. No other work up needed that would change his risk for surgery at this time.  Minimize general anesthesia as much as possible. -use IS/flutter valve   2-hypertension: -Fair control -In the setting of nothing by mouth we will use IV Lopressor for blood pressure control -Follow vital signs  3-hyponatremia and hypokalemia -Most likely associated with dehydration and poor oral intake -Continue fluid resuscitation of these electrolytes and follow trend  4-chronic combined congestive heart failure -Compensated -Follow strict intake and output -Continue beta-blockers  5-chronic respiratory failure and COPD -Continue DuoNeb  -Continue oxygen supplementation -will start patient on flonase  6-GERD -continue PPI, IV for now  7-HLD -holding statins for now  8-lung cancer -Dr. Marin Olp made aware of his admission -further recommendations to be provided by oncology service    Our team will followup again tomorrow. Please contact [facility] if we can be of assistance in the meanwhile. Thank you for this consultation.  Chief Complaint: nausea, abd pain, no BM's  HPI:  77 year old male with a past medical history significant for hypertension, hyperlipidemia coronary artery disease, combined CHF with an ejection fraction of 45-50%, history of lung cancer (actively receiving immunotherapy under the care of Dr. Marin Olp), chronic  respiratory failure secondary to COPD (using 2 L of nasal cannula supplementation at home) and GERD; who presented to the emergency department secondary to abdominal pain, nausea and approximately 3-4 days of no bowel movements.  Patient also have significant decrease in appetite and was noted large nonreducible right inguinal hernia. Patient has been admitted by general surgery secondary to incarcerated inguinal hernia and [facility] has been consulted to provide management of his chronic medical problems.  Review of Systems:  Patient reports some chills, abdominal pain, constipation and intermittent nausea.  Past Medical History:  Diagnosis Date  . Arthritis   . Asthma   . CHF (congestive heart failure) (HCC)    ef=45-50%  . COPD (chronic obstructive pulmonary disease) ([place])    home O2  . Coronary artery disease    40-50% mLAD, 40% pRCA, 30% mRCA, occluded CX with retrograde filling 06/01/06 cath  . GERD (gastroesophageal reflux disease)    ulcers  . Goals of care, counseling/discussion 04/07/2016  . History of external beam radiation therapy 04/24/16-06/04/16   chest 60 Gy in 30 fractions  . Hyperlipidemia   . Hypertension   . 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 PVC's (premature ventricular contractions)    2017  . Squamous cell lung cancer, right ([place]) 04/07/2016   Past Surgical History:  Procedure Laterality Date  . ESOPHAGOGASTRODUODENOSCOPY (EGD) WITH PROPOFOL N/A 02/26/2016   Procedure: ESOPHAGOGASTRODUODENOSCOPY (EGD) WITH PROPOFOL;  Surgeon: Irene Shipper, MD;  [facility];  Service: Endoscopy;  Laterality: N/A;  . EXPLORATORY LAPAROTOMY     for ulcer  "washed me out with a hose pipe"  . EYE SURGERY     cataracts  . HERNIA REPAIR    . INGUINAL HERNIA REPAIR Left 09/26/2016   Procedure: HERNIA REPAIR  INGUINAL LEFT;  Surgeon: Jackolyn Confer, MD;  [facility];  Service: General;  Laterality: Left;  With  MESH  . IR GENERIC HISTORICAL  04/10/2016   IR US GUIDE VASC ACCESS RIGHT 04/10/2016 Markus Daft, MD [facility]  . IR GENERIC HISTORICAL  04/10/2016   IR FLUORO GUIDE PORT INSERTION RIGHT 04/10/2016 Markus Daft, MD [facility]  . IR GENERIC HISTORICAL  04/10/2016   IR FLUORO RM 30-60 MIN 04/10/2016 Markus Daft, MD [facility]  . IR GENERIC HISTORICAL  04/18/2016   IR FLUORO RM 30-60 MIN 04/18/2016 Greggory Keen, MD [facility]  . JOINT REPLACEMENT     knee both  . PORTACATH PLACEMENT Right 04/10/2016  . VIDEO BRONCHOSCOPY WITH ENDOBRONCHIAL ULTRASOUND N/A 03/26/2016   Procedure: VIDEO BRONCHOSCOPY WITH ENDOBRONCHIAL ULTRASOUND;  Surgeon: Collene Gobble, MD;  Location: [place];  Service: Thoracic;  Laterality: N/A;   Social History:  reports that he quit smoking about 16 months ago. His smoking use included cigarettes. He has a 45.00 pack-year smoking history. he has never used smokeless tobacco. He reports that he does not drink alcohol or use drugs.  Allergies  Allergen Reactions  . No Known Allergies    Family History  Problem Relation Age of Onset  . Arthritis Mother   . COPD Mother   . Alcohol abuse Father   . Breast cancer Sister   . Heart disease Paternal Grandfather   . Hyperlipidemia Sister     Prior to Admission medications   Medication Sig Start Date End Date Taking? Authorizing Provider  atorvastatin (LIPITOR) 40 MG tablet TAKE 1 TABLET(40 MG) BY MOUTH DAILY 01/08/17  Yes Pickard, Cammie Mcgee, MD  CARAFATE 1 GM/10ML suspension Take 10 mLs (1 g total) 2 (two) times daily by mouth. 12/16/16  Yes Ennever, Rudell Cobb, MD  COMBIVENT RESPIMAT 20-100 MCG/ACT AERS respimat INHALE 1 PUFF BY MOUTH EVERY 6 HOURS AS NEEDED FOR WHEEZING 01/26/17  Yes Susy Frizzle, MD  HYDROcodone-acetaminophen (NORCO/VICODIN) 5-325 MG tablet Take 1 tablet by mouth 2 (two) times daily as needed for moderate pain. 01/30/17  Yes Susy Frizzle, MD  ipratropium-albuterol (DUONEB) 0.5-2.5 (3) MG/3ML SOLN USE 1 VIAL  VIA NEBULIZER EVERY 6 HOURS AS NEEDED FOR SHORTNESS OF BREATH OR WHEEZING 03/26/16  Yes Young, Clinton D, MD  metoprolol tartrate (LOPRESSOR) 25 MG tablet TAKE 1 TABLET(25 MG) BY MOUTH TWICE DAILY 12/29/16  Yes Susy Frizzle, MD  Multiple Vitamin (MULTIVITAMIN) tablet Take 1 tablet by mouth daily. Centrum Silver   Yes [provider]  omeprazole (PRILOSEC) 20 MG capsule TAKE ONE CAPSULE BY MOUTH EVERY DAY 12/29/16  Yes Susy Frizzle, MD  STIOLTO RESPIMAT 2.5-2.5 MCG/ACT AERS INHALE 2 PUFFS BY MOUTH DAILY 10/07/16  Yes Susy Frizzle, MD  triamterene-hydrochlorothiazide (MAXZIDE-25) 37.5-25 MG tablet TK 1 T PO QD FOR 3 DAYS PRF LEG SWELLING 01/18/17  Yes [provider]   Physical Exam: Blood pressure (!) 159/86, pulse (!) 109, temperature 98.6 F (37 C), SpO2 95 %. Vitals:   02/25/17 1200 02/25/17 1849  BP: (!) 159/86   Pulse: (!) 109   Temp: 98.1 F (36.7 C) 98.6 F (37 C)  SpO2: 95%      General: Afebrile, denies chest pain, no shortness of breath.  Patient reports some pain in his abdomen and currently did not have any appetite.  Denies nausea and vomiting currently.  Eyes: PERRLA, extraocular muscles intact, no icterus  ENT: Congested speech and complaining of nasal congestion,  2 L of oxygen through nasal cannula in place, no thrush, no erythema in his throat.  Neck: No JVD, no bruits, no thyromegaly  Cardiovascular: No rubs, no gallops, no murmurs, S1 and S2 appreciated on exam.  Respiratory: Scattered rhonchi, no using accessory muscles, currently no wheezing.  Abdomen: Moderate distention, no bowel sounds appreciated on exam, patient with positive moderate to large bulge in the right groin none reducible  Skin: Well-healed midline scar in his abdomen, no open wounds, no erythema, no petechiae.  Musculoskeletal: No joint swelling, good range of motion, trace edema lower extremity bilaterally.  Psychiatric: Alert, awake and oriented x3, good  insight, no hallucinations.  Neurologic: Cranial nerves grossly intact, no focal neurologic deficits appreciated on exam.  Labs on Admission:  Basic Metabolic Panel: Recent Labs  Lab 02/25/17 1517  NA 128*  K 3.3*  CL 88*  CO2 28  GLUCOSE 111*  BUN 29*  CREATININE 0.69  CALCIUM 8.7*   Liver Function Tests: Recent Labs  Lab 02/25/17 1517  AST 23  ALT 23  ALKPHOS 64  BILITOT 1.2  PROT 6.3*  ALBUMIN 3.5   CBC: Recent Labs  Lab 02/25/17 1414  WBC 4.9  NEUTROABS 3.6  HGB 14.1  HCT 40.2  MCV 88.7  PLT 153   Radiological Exams on Admission: Dg Abd 2 Views  Result Date: 02/25/2017 CLINICAL DATA:  77 year old male with a history of abdominal pain EXAM: ABDOMEN - 2 VIEW COMPARISON:  PET-CT 08/08/2016 FINDINGS: Distended small bowel loops with decubitus imaging demonstrating multiple air-fluid levels. Formed stool within the right abdomen, potentially within dilated proximal colon. Relative absence of distal colonic gas. Osteopenia. IMPRESSION: Evidence of bowel obstruction with multiple air-fluid levels and distended loops of small bowel. Further evaluation with contrast-enhanced CT recommended. Electronically Signed   By: Corrie Mckusick D.O.   On: 02/25/2017 16:37    EKG: None    Time spent: 50 minutes  [facility] Pager [phone]  If 7PM-7AM, please contact night-coverage [email or web address] Password [facility] 02/25/2017, 7:21 PM

## 2017-02-26 ENCOUNTER — Observation Stay (HOSPITAL_COMMUNITY): Payer: Medicare Other | Admitting: Certified Registered Nurse Anesthetist

## 2017-02-26 ENCOUNTER — Encounter (HOSPITAL_COMMUNITY): Payer: Self-pay | Admitting: Emergency Medicine

## 2017-02-26 ENCOUNTER — Encounter (HOSPITAL_COMMUNITY): Admission: AD | Disposition: A | Discharge status: Home / Self Care | Payer: Self-pay | Source: Ambulatory Visit

## 2017-02-26 DIAGNOSIS — Z87891 Personal history of nicotine dependence: Secondary | ICD-10-CM | POA: Diagnosis not present

## 2017-02-26 DIAGNOSIS — C3491 Malignant neoplasm of unspecified part of right bronchus or lung: Secondary | ICD-10-CM | POA: Diagnosis not present

## 2017-02-26 DIAGNOSIS — D649 Anemia, unspecified: Secondary | ICD-10-CM | POA: Diagnosis not present

## 2017-02-26 DIAGNOSIS — R188 Other ascites: Secondary | ICD-10-CM | POA: Diagnosis present

## 2017-02-26 DIAGNOSIS — J9611 Chronic respiratory failure with hypoxia: Secondary | ICD-10-CM

## 2017-02-26 DIAGNOSIS — I5022 Chronic systolic (congestive) heart failure: Secondary | ICD-10-CM | POA: Diagnosis not present

## 2017-02-26 DIAGNOSIS — Z8261 Family history of arthritis: Secondary | ICD-10-CM | POA: Diagnosis not present

## 2017-02-26 DIAGNOSIS — J449 Chronic obstructive pulmonary disease, unspecified: Secondary | ICD-10-CM

## 2017-02-26 DIAGNOSIS — E876 Hypokalemia: Secondary | ICD-10-CM | POA: Diagnosis present

## 2017-02-26 DIAGNOSIS — E785 Hyperlipidemia, unspecified: Secondary | ICD-10-CM | POA: Diagnosis present

## 2017-02-26 DIAGNOSIS — Z825 Family history of asthma and other chronic lower respiratory diseases: Secondary | ICD-10-CM | POA: Diagnosis not present

## 2017-02-26 DIAGNOSIS — I10 Essential (primary) hypertension: Secondary | ICD-10-CM

## 2017-02-26 DIAGNOSIS — L8990 Pressure ulcer of unspecified site, unspecified stage: Secondary | ICD-10-CM | POA: Diagnosis present

## 2017-02-26 DIAGNOSIS — Z8249 Family history of ischemic heart disease and other diseases of the circulatory system: Secondary | ICD-10-CM | POA: Diagnosis not present

## 2017-02-26 DIAGNOSIS — D696 Thrombocytopenia, unspecified: Secondary | ICD-10-CM | POA: Diagnosis present

## 2017-02-26 DIAGNOSIS — I5042 Chronic combined systolic (congestive) and diastolic (congestive) heart failure: Secondary | ICD-10-CM

## 2017-02-26 DIAGNOSIS — I110 Hypertensive heart disease with heart failure: Secondary | ICD-10-CM | POA: Diagnosis present

## 2017-02-26 DIAGNOSIS — Z923 Personal history of irradiation: Secondary | ICD-10-CM | POA: Diagnosis not present

## 2017-02-26 DIAGNOSIS — I2510 Atherosclerotic heart disease of native coronary artery without angina pectoris: Secondary | ICD-10-CM | POA: Diagnosis present

## 2017-02-26 DIAGNOSIS — Z9981 Dependence on supplemental oxygen: Secondary | ICD-10-CM | POA: Diagnosis not present

## 2017-02-26 DIAGNOSIS — E871 Hypo-osmolality and hyponatremia: Secondary | ICD-10-CM | POA: Diagnosis not present

## 2017-02-26 DIAGNOSIS — K219 Gastro-esophageal reflux disease without esophagitis: Secondary | ICD-10-CM | POA: Diagnosis present

## 2017-02-26 DIAGNOSIS — K4030 Unilateral inguinal hernia, with obstruction, without gangrene, not specified as recurrent: Secondary | ICD-10-CM | POA: Diagnosis not present

## 2017-02-26 DIAGNOSIS — E440 Moderate protein-calorie malnutrition: Secondary | ICD-10-CM | POA: Diagnosis present

## 2017-02-26 DIAGNOSIS — Z8349 Family history of other endocrine, nutritional and metabolic diseases: Secondary | ICD-10-CM | POA: Diagnosis not present

## 2017-02-26 DIAGNOSIS — M1990 Unspecified osteoarthritis, unspecified site: Secondary | ICD-10-CM | POA: Diagnosis present

## 2017-02-26 DIAGNOSIS — E860 Dehydration: Secondary | ICD-10-CM | POA: Diagnosis present

## 2017-02-26 DIAGNOSIS — Z803 Family history of malignant neoplasm of breast: Secondary | ICD-10-CM | POA: Diagnosis not present

## 2017-02-26 HISTORY — PX: INGUINAL HERNIA REPAIR: SHX194

## 2017-02-26 HISTORY — PX: INSERTION OF MESH: SHX5868

## 2017-02-26 LAB — BASIC METABOLIC PANEL
Anion gap: 9 (ref 5–15)
BUN: 27 mg/dL — ABNORMAL HIGH (ref 6–20)
CALCIUM: 9 mg/dL (ref 8.9–10.3)
CO2: 29 mmol/L (ref 22–32)
CREATININE: 0.7 mg/dL (ref 0.61–1.24)
Chloride: 96 mmol/L — ABNORMAL LOW (ref 101–111)
GFR calc Af Amer: >60 mL/min (ref >60)
GFR calc non Af Amer: >60 mL/min (ref >60)
GLUCOSE: 131 mg/dL — AB (ref 65–99)
Potassium: 4.1 mmol/L (ref 3.5–5.1)
Sodium: 134 mmol/L — ABNORMAL LOW (ref 135–145)

## 2017-02-26 LAB — CBC
HEMATOCRIT: 40.2 % (ref 39.0–52.0)
Hemoglobin: 14 g/dL (ref 13.0–17.0)
MCH: 31.3 pg (ref 26.0–34.0)
MCHC: 34.8 g/dL (ref 30.0–36.0)
MCV: 89.7 fL (ref 78.0–100.0)
Platelets: 139 10*3/uL — ABNORMAL LOW (ref 150–400)
RBC: 4.48 MIL/uL (ref 4.22–5.81)
RDW: 14.2 % (ref 11.5–15.5)
WBC: 4.7 10*3/uL (ref 4.0–10.5)

## 2017-02-26 SURGERY — REPAIR, HERNIA, INGUINAL, INCARCERATED
Anesthesia: Spinal | Site: Groin | Laterality: Right

## 2017-02-26 MED ORDER — BUPIVACAINE-EPINEPHRINE 0.5% -1:200000 IJ SOLN
INTRAMUSCULAR | Status: DC | PRN
  Administered 2017-02-26: 20 mL

## 2017-02-26 MED ORDER — LACTATED RINGERS IV SOLN
INTRAVENOUS | Status: DC
  Administered 2017-02-26: 09:00:00 via INTRAVENOUS

## 2017-02-26 MED ORDER — ONDANSETRON HCL 4 MG/2ML IJ SOLN: 4.0000 mg | Freq: Once | INTRAMUSCULAR | Status: DC | PRN

## 2017-02-26 MED ORDER — PHENYLEPHRINE HCL 10 MG/ML IJ SOLN
INTRAMUSCULAR | Status: DC | PRN
  Administered 2017-02-26 (×5): 120 ug via INTRAVENOUS
  Administered 2017-02-26: 80 ug via INTRAVENOUS
  Administered 2017-02-26: 120 ug via INTRAVENOUS

## 2017-02-26 MED ORDER — BUPIVACAINE IN DEXTROSE 0.75-8.25 % IT SOLN
INTRATHECAL | Status: DC | PRN
  Administered 2017-02-26: 1.6 mL via INTRATHECAL

## 2017-02-26 MED ORDER — PANTOPRAZOLE SODIUM 40 MG PO TBEC
40.0000 mg | DELAYED_RELEASE_TABLET | Freq: Every day | ORAL | Status: DC
  Administered 2017-02-26 – 2017-02-27 (×2): 40 mg via ORAL
  Filled 2017-02-26 (×2): qty 1

## 2017-02-26 MED ORDER — METHOCARBAMOL 500 MG PO TABS: 500.0000 mg | ORAL_TABLET | Freq: Three times a day (TID) | ORAL | Status: DC | PRN

## 2017-02-26 MED ORDER — DEXAMETHASONE SODIUM PHOSPHATE 10 MG/ML IJ SOLN
INTRAMUSCULAR | Status: AC
  Filled 2017-02-26: qty 1

## 2017-02-26 MED ORDER — ONDANSETRON HCL 4 MG/2ML IJ SOLN
INTRAMUSCULAR | Status: DC | PRN
  Administered 2017-02-26: 4 mg via INTRAVENOUS

## 2017-02-26 MED ORDER — PHENYLEPHRINE 40 MCG/ML (10ML) SYRINGE FOR IV PUSH (FOR BLOOD PRESSURE SUPPORT)
PREFILLED_SYRINGE | INTRAVENOUS | Status: AC
  Filled 2017-02-26: qty 20

## 2017-02-26 MED ORDER — ATORVASTATIN CALCIUM 40 MG PO TABS
40.0000 mg | ORAL_TABLET | Freq: Every day | ORAL | Status: DC
  Administered 2017-02-26: 40 mg via ORAL
  Filled 2017-02-26: qty 1

## 2017-02-26 MED ORDER — CEFAZOLIN SODIUM-DEXTROSE 2-4 GM/100ML-% IV SOLN
INTRAVENOUS | Status: AC
  Filled 2017-02-26: qty 100

## 2017-02-26 MED ORDER — ALBUTEROL SULFATE HFA 108 (90 BASE) MCG/ACT IN AERS
INHALATION_SPRAY | RESPIRATORY_TRACT | Status: AC
  Filled 2017-02-26: qty 6.7

## 2017-02-26 MED ORDER — BUPIVACAINE LIPOSOME 1.3 % IJ SUSP
20.0000 mL | Freq: Once | INTRAMUSCULAR | Status: AC
  Administered 2017-02-26: 20 mL
  Filled 2017-02-26: qty 20

## 2017-02-26 MED ORDER — BUPIVACAINE-EPINEPHRINE 0.5% -1:200000 IJ SOLN
INTRAMUSCULAR | Status: AC
  Filled 2017-02-26: qty 1

## 2017-02-26 MED ORDER — SUCRALFATE 1 GM/10ML PO SUSP
1.0000 g | Freq: Two times a day (BID) | ORAL | Status: DC
  Administered 2017-02-26 – 2017-02-27 (×2): 1 g via ORAL
  Filled 2017-02-26 (×2): qty 10

## 2017-02-26 MED ORDER — PROPOFOL 10 MG/ML IV BOLUS
INTRAVENOUS | Status: AC
  Filled 2017-02-26: qty 40

## 2017-02-26 MED ORDER — HYDROMORPHONE HCL 1 MG/ML IJ SOLN: 0.2500 mg | INTRAMUSCULAR | Status: DC | PRN

## 2017-02-26 MED ORDER — ALBUTEROL SULFATE HFA 108 (90 BASE) MCG/ACT IN AERS
INHALATION_SPRAY | RESPIRATORY_TRACT | Status: DC | PRN
  Administered 2017-02-26: 2 via RESPIRATORY_TRACT

## 2017-02-26 MED ORDER — ONDANSETRON HCL 4 MG/2ML IJ SOLN
INTRAMUSCULAR | Status: AC
Start: 2017-02-26 — End: ?
  Filled 2017-02-26: qty 2

## 2017-02-26 MED ORDER — METOPROLOL TARTRATE 25 MG PO TABS
25.0000 mg | ORAL_TABLET | Freq: Two times a day (BID) | ORAL | Status: DC
  Administered 2017-02-26 – 2017-02-27 (×2): 25 mg via ORAL
  Filled 2017-02-26 (×2): qty 1

## 2017-02-26 MED ORDER — FENTANYL CITRATE (PF) 100 MCG/2ML IJ SOLN
INTRAMUSCULAR | Status: AC
  Filled 2017-02-26: qty 2

## 2017-02-26 MED ORDER — PROPOFOL 10 MG/ML IV BOLUS
INTRAVENOUS | Status: DC | PRN
  Administered 2017-02-26: 20 mg via INTRAVENOUS
  Administered 2017-02-26 (×2): 10 mg via INTRAVENOUS
  Administered 2017-02-26: 20 mg via INTRAVENOUS

## 2017-02-26 MED ORDER — HYDROCODONE-ACETAMINOPHEN 5-325 MG PO TABS
1.0000 | ORAL_TABLET | Freq: Four times a day (QID) | ORAL | Status: DC | PRN
  Administered 2017-02-26: 1 via ORAL
  Administered 2017-02-26: 2 via ORAL
  Filled 2017-02-26: qty 2
  Filled 2017-02-26: qty 1

## 2017-02-26 MED ORDER — MEPERIDINE HCL 50 MG/ML IJ SOLN: 6.2500 mg | INTRAMUSCULAR | Status: DC | PRN

## 2017-02-26 MED ORDER — 0.9 % SODIUM CHLORIDE (POUR BTL) OPTIME
TOPICAL | Status: DC | PRN
  Administered 2017-02-26: 1000 mL

## 2017-02-26 SURGICAL SUPPLY — 48 items
ADH SKN CLS APL DERMABOND .7 (GAUZE/BANDAGES/DRESSINGS) ×1
BLADE HEX COATED 2.75 (ELECTRODE) ×3 IMPLANT
BLADE SURG 15 STRL LF DISP TIS (BLADE) ×1 IMPLANT
BLADE SURG 15 STRL SS (BLADE) ×3
BLADE SURG SZ10 CARB STEEL (BLADE) ×3 IMPLANT
CLOSURE WOUND 1/2 X4 (GAUZE/BANDAGES/DRESSINGS)
COVER SURGICAL LIGHT HANDLE (MISCELLANEOUS) ×3 IMPLANT
DECANTER SPIKE VIAL GLASS SM (MISCELLANEOUS) IMPLANT
DERMABOND ADVANCED (GAUZE/BANDAGES/DRESSINGS) ×2
DERMABOND ADVANCED .7 DNX12 (GAUZE/BANDAGES/DRESSINGS) IMPLANT
DRAIN PENROSE 18X1/2 LTX STRL (DRAIN) ×3 IMPLANT
DRAPE INCISE IOBAN 66X45 STRL (DRAPES) ×3 IMPLANT
DRAPE LAPAROTOMY TRNSV 102X78 (DRAPE) ×3 IMPLANT
ELECT PENCIL ROCKER SW 15FT (MISCELLANEOUS) ×3 IMPLANT
ELECT REM PT RETURN 15FT ADLT (MISCELLANEOUS) ×3 IMPLANT
GAUZE SPONGE 4X4 12PLY STRL (GAUZE/BANDAGES/DRESSINGS) IMPLANT
GLOVE BIOGEL PI IND STRL 7.0 (GLOVE) ×1 IMPLANT
GLOVE BIOGEL PI IND STRL 7.5 (GLOVE) ×1 IMPLANT
GLOVE BIOGEL PI INDICATOR 7.0 (GLOVE) ×2
GLOVE BIOGEL PI INDICATOR 7.5 (GLOVE) ×2
GLOVE ECLIPSE 7.5 STRL STRAW (GLOVE) ×3 IMPLANT
GOWN STRL REUS W/TWL LRG LVL3 (GOWN DISPOSABLE) ×3 IMPLANT
GOWN STRL REUS W/TWL XL LVL3 (GOWN DISPOSABLE) ×6 IMPLANT
KIT BASIN OR (CUSTOM PROCEDURE TRAY) ×3 IMPLANT
MESH HERNIA 6X6 BARD (Mesh General) IMPLANT
MESH HERNIA BARD 6X6 (Mesh General) ×2 IMPLANT
NDL HYPO 25X1 1.5 SAFETY (NEEDLE) ×1 IMPLANT
NEEDLE HYPO 22GX1.5 SAFETY (NEEDLE) IMPLANT
NEEDLE HYPO 25X1 1.5 SAFETY (NEEDLE) IMPLANT
NS IRRIG 1000ML POUR BTL (IV SOLUTION) ×3 IMPLANT
PACK BASIC VI WITH GOWN DISP (CUSTOM PROCEDURE TRAY) ×3 IMPLANT
SPONGE LAP 4X18 X RAY DECT (DISPOSABLE) ×3 IMPLANT
STRIP CLOSURE SKIN 1/2X4 (GAUZE/BANDAGES/DRESSINGS) IMPLANT
SUCTION POOLE TIP (SUCTIONS) ×2 IMPLANT
SUT MNCRL AB 4-0 PS2 18 (SUTURE) ×3 IMPLANT
SUT PROLENE 2 0 CT2 30 (SUTURE) ×8 IMPLANT
SUT SILK 0 SH 30 (SUTURE) ×2 IMPLANT
SUT SILK 2 0 SH (SUTURE) ×3 IMPLANT
SUT VIC AB 3-0 54XBRD REEL (SUTURE) ×1 IMPLANT
SUT VIC AB 3-0 BRD 54 (SUTURE) ×3
SUT VIC AB 3-0 CT1 27 (SUTURE) ×3
SUT VIC AB 3-0 CT1 TAPERPNT 27 (SUTURE) ×1 IMPLANT
SUT VIC AB 3-0 SH 27 (SUTURE) ×3
SUT VIC AB 3-0 SH 27XBRD (SUTURE) IMPLANT
SYR BULB IRRIGATION 50ML (SYRINGE) ×3 IMPLANT
SYR CONTROL 10ML LL (SYRINGE) ×3 IMPLANT
TOWEL OR 17X26 10 PK STRL BLUE (TOWEL DISPOSABLE) ×3 IMPLANT
YANKAUER SUCT BULB TIP 10FT TU (MISCELLANEOUS) ×3 IMPLANT

## 2017-02-26 NOTE — Progress Notes (Signed)
Pt incontinent of moderately loose brown stools since 1645. Three total so far. Next shift nurse aware. Daughter reported before leaving that pt had not eaten or had a BM in 4 or 5 days.

## 2017-02-26 NOTE — Anesthesia Preprocedure Evaluation (Signed)
Anesthesia Evaluation  Patient identified by MRN, date of birth, ID band Patient awake    Reviewed: Allergy & Precautions, NPO status , Patient's Chart, lab work & pertinent test results  Airway Mallampati: I  TM Distance: >3 FB Neck ROM: Full    Dental   Pulmonary COPD,  oxygen dependent, former smoker,    Pulmonary exam normal        Cardiovascular hypertension, Pt. on medications Normal cardiovascular exam     Neuro/Psych Depression    GI/Hepatic GERD  Medicated and Controlled,  Endo/Other    Renal/GU      Musculoskeletal   Abdominal   Peds  Hematology   Anesthesia Other Findings   Reproductive/Obstetrics                             Anesthesia Physical Anesthesia Plan  ASA: III  Anesthesia Plan: Spinal   Post-op Pain Management:    Induction: Intravenous  PONV Risk Score and Plan: 2 and Ondansetron and Treatment may vary due to age or medical condition  Airway Management Planned: Simple Face Mask  Additional Equipment:   Intra-op Plan:   Post-operative Plan:   Informed Consent: I have reviewed the patients History and Physical, chart, labs and discussed the procedure including the risks, benefits and alternatives for the proposed anesthesia with the patient or authorized representative who has indicated his/her understanding and acceptance.     Plan Discussed with: CRNA and Surgeon  Anesthesia Plan Comments:         Anesthesia Quick Evaluation

## 2017-02-26 NOTE — Progress Notes (Signed)
Patient ID: Bradley Meadows, male   DOB: [birth year], 77 y.o.   MRN: [id number] Day of Surgery   Subjective: No change overnight.  No vomiting.  Abdomen remains somewhat distended.  Objective: Vital signs in last 24 hours: Temp:  [98.1 F (36.7 C)-99.2 F (37.3 C)] 98.8 F (37.1 C) (01/24 0841) Pulse Rate:  [97-116] 97 (01/24 0841) Resp:  [18-20] 20 (01/24 0841) BP: (120-159)/(70-89) 121/75 (01/24 0841) SpO2:  [93 %-95 %] 94 % (01/24 0841) Weight:  [83.9 kg (185 lb)] 83.9 kg (185 lb) (01/23 1935) Last BM Date: 02/20/17  Intake/Output from previous day: 01/23 0701 - 01/24 0700 In: 1462.9 [I.V.:1462.9] Out: 450 [Urine:450] Intake/Output this shift: Total I/O In: 310 [I.V.:310] Out: 0   General appearance: alert, cooperative and no distress GI: Abdomen moderately distended but nontender and not tense.  Palpable mass right groin unchanged  Lab Results:  Recent Labs    02/25/17 1414 02/26/17 0730  WBC 4.9 4.7  HGB 14.1 14.0  HCT 40.2 40.2  PLT 153 139*   BMET Recent Labs    02/25/17 1517  NA 128*  K 3.3*  CL 88*  CO2 28  GLUCOSE 111*  BUN 29*  CREATININE 0.69  CALCIUM 8.7*     Studies/Results: Dg Abd 2 Views  Result Date: 02/25/2017 CLINICAL DATA:  77 year old male with a history of abdominal pain EXAM: ABDOMEN - 2 VIEW COMPARISON:  PET-CT 08/08/2016 FINDINGS: Distended small bowel loops with decubitus imaging demonstrating multiple air-fluid levels. Formed stool within the right abdomen, potentially within dilated proximal colon. Relative absence of distal colonic gas. Osteopenia. IMPRESSION: Evidence of bowel obstruction with multiple air-fluid levels and distended loops of small bowel. Further evaluation with contrast-enhanced CT recommended. Electronically Signed   By: Corrie Mckusick D.O.   On: 02/25/2017 16:37    Anti-infectives: Anti-infectives (From admission, onward)   Start     Dose/Rate Route Frequency Ordered Stop   02/26/17 0600  [MAR Hold]  ceFAZolin  (ANCEF) IVPB 2g/100 mL premix     (MAR Hold since 02/26/17 0846)   2 g 200 mL/hr over 30 Minutes Intravenous On call to O.R. 02/25/17 1624 02/27/17 0559      Assessment/Plan: Incarcerated right inguinal hernia.  Plan to proceed this morning with repair under spinal anesthesia if okay with anesthesia.  Discussed the procedure again with the patient and his daughter and all questions answered and they agree to proceed.    LOS: 1 day    Edward Jolly 02/26/2017

## 2017-02-26 NOTE — Anesthesia Procedure Notes (Signed)
Spinal  Patient location during procedure: OR Start time: 02/26/2017 9:45 AM End time: 02/26/2017 9:50 AM Staffing Anesthesiologist: Lillia Abed, MD Performed: anesthesiologist  Preanesthetic Checklist Completed: patient identified, surgical consent, pre-op evaluation, timeout performed, IV checked, risks and benefits discussed and monitors and equipment checked Spinal Block Patient position: sitting Prep: DuraPrep Patient monitoring: heart rate, cardiac monitor, continuous pulse ox and blood pressure Approach: right paramedian Location: L3-4 Injection technique: single-shot Needle Needle type: Pencan  Needle gauge: 24 G Needle length: 9 cm Needle insertion depth: 7 cm

## 2017-02-26 NOTE — Progress Notes (Signed)
  PROGRESS NOTE  [name] SEG:[id number] DOB: 1940/06/13 DOA: 02/25/2017 PCP: Susy Frizzle, MD  Brief Narrative: 77yom presented with abd pain. Admitted for incarcerated inguinal hernia. TR[facility] consulted for medical management.  Assessment/Plan Incarcerated right inguinal hernia  - s/p surgery 11/24 - management per surgery  Essential HTN - stable, IV metoprolol until able to take PO  Hyponatremia - likely secondary to poor oral intake - resolving  Chronic combined CHF - appears compensated, continue beta-blocker  Chronic hypoxic resp failure, COPD - appears stable; continue nebs, oxygen supplementation  Lung cancer - per Dr. Marin Olp  DVT prophylaxis: per surgery Code Status: full   Murray Hodgkins, MD  [facility] Direct contact: [phone] --Via amion app OR  --[email or web address]; password [email or web address]  7PM-7AM contact night coverage as above 02/26/2017, 3:43 PM  LOS: 1 day    Interval history/Subjective: Feels ok.  Objective: Vitals:  Vitals:   02/26/17 1340 02/26/17 1429  BP: 129/70 126/66  Pulse: (!) 103 100  Resp: 20 20  Temp: 98.3 F (36.8 C) 98.6 F (37 C)  SpO2: 94% 92%    Exam:  Constitutional:  . Appears calm and comfortable Respiratory:  . CTA bilaterally, no w/r/r.  . Respiratory effort normal.  Cardiovascular:  . RRR, no m/r/g Psychiatric:  . Mental status o Mood, affect appropriate   I have personally reviewed the following:   Labs:  BMP unremarkable  CBC unremarkable  Scheduled Meds: . atorvastatin  40 mg Oral q1800  . fluticasone  1 spray Each Nare Daily  . metoprolol tartrate  25 mg Oral BID  . pantoprazole  40 mg Oral Daily  . sucralfate  1 g Oral BID AC   Continuous Infusions: . dextrose 5 % and 0.9 % NaCl with KCl 40 mEq/L 100 mL/hr (02/26/17 0934)    Active Problems:   Incarcerated right inguinal hernia   LOS: 1 day

## 2017-02-26 NOTE — Plan of Care (Signed)
  Pain Managment: General experience of comfort will improve 02/26/2017 0845 - Progressing by Dorene Sorrow, RN   Safety: Ability to remain free from injury will improve 02/26/2017 0845 - Progressing by Dorene Sorrow, RN   Skin Integrity: Risk for impaired skin integrity will decrease 02/26/2017 0845 - Progressing by Dorene Sorrow, RN

## 2017-02-26 NOTE — Op Note (Signed)
Preoperative Diagnosis: Incarcerated rt inguinal hernia  Postoprative Diagnosis: Incarcerated rt inguinal hernia  Procedure: Procedure(s): HERNIA REPAIR INGUINAL INCARCERATED WITH MESH INSERTION OF MESH   Surgeon: Excell Seltzer T   Assistants: None  Anesthesia:  Spinal anesthesia  Indications: Patient is a 77 year old male with history of lung cancer and severe COPD who presents with clinically an incarcerated right inguinal hernia and partial small bowel obstruction.  We have recommended urgent repair under spinal anesthesia.  Procedure and risks have been discussed with the patient and his daughter detailed elsewhere and he agrees to proceed.    Procedure Detail: Patient was brought to the operating room and spinal anesthesia induced.  He was positioned in the supine position in the entire abdomen and bilateral groins widely sterilely prepped and draped.  He received preoperative IV antibiotics.  Patient timeout was performed to correct procedure verified.  I used an oblique incision in the right groin and dissection was carried down through the subtenons tissue.  The external oblique was identified and divided along the lines of its fibers through the external ring.  I then began to dissect the hernia mass and cord up off the floor the inguinal canal.  The hernia spontaneously reduced.  The hernia sac was identified and completely dissected away from cord structures up to the level of the internal ring.  The cord was mobilized dividing cremasteric fibers.  I opened the hernia sac and there was a small amount of clear ascites.  The defect was fairly small.  I could examine some portions of nearby bowel loops but could not really completely run the small intestine.  However no secondary evidence of ischemia.  The hernia sac was suture ligated at the internal ring with 0 silk.  Prolene mesh was trimmed to size to fit the floor of the inguinal canal with tails to go around the cord at the  internal ring.  It was sutured initially to the pubic tubercle and then to the inguinal ligament and iliopubic tract working medial to lateral with running 2-0 Prolene until I was well lateral to the internal ring.  Medially the mesh was sutured to the edge of the rectus sheath with interrupted 2-0 Prolene.  Tails were then tacked together lateral to the cord creating a new internal ring snug to a fingertip.  Expelled diluted with Marcaine to 40 cc total was then used to infiltrate all soft tissues.  There was no bleeding.  The cord was returned to its anatomic position.  The external oblique was closed over this with running 3-0 Vicryl.  Subcu was closed with running 3-0 Vicryl and skin with running subcuticular 5-0 Monocryl and Dermabond.  Sponge needle and instrument counts were correct.    Findings: Incarcerated indirect right inguinal hernia without evidence of bowel ischemia  Estimated Blood Loss:  Minimal         Drains: None  Blood Given: none          Specimens: None        Complications:  * No complications entered in OR log *         Disposition: PACU - hemodynamically stable.         Condition: stable

## 2017-02-26 NOTE — Care Management Obs Status (Signed)
[place] NOTIFICATION   Patient Details  Name: Bradley Meadows MRN: [id number] Date of Birth: [birth year]   Medicare Observation Status Notification Given:  Yes    Guadalupe Maple, RN 02/26/2017, 2:15 PM

## 2017-02-26 NOTE — Transfer of Care (Signed)
Immediate Anesthesia Transfer of Care Note  Patient: Bradley Meadows  Procedure(s) Performed: HERNIA REPAIR INGUINAL INCARCERATED WITH MESH (Right Groin) INSERTION OF MESH (Right Groin)  Patient Location: PACU  Anesthesia Type:Spinal  Level of Consciousness: awake and alert   Airway & Oxygen Therapy: Patient Spontanous Breathing and Patient connected to face mask oxygen  Post-op Assessment: Report given to RN and Post -op Vital signs reviewed and stable  Post vital signs: Reviewed and stable  Last Vitals:  Vitals:   02/26/17 0621 02/26/17 0841  BP: 130/70 121/75  Pulse: (!) 103 97  Resp:  20  Temp:  37.1 C  SpO2:  94%    Last Pain:  Vitals:   02/26/17 0913  TempSrc:   PainSc: 8       Patients Stated Pain Goal: 4 (68/34/19 6222)  Complications: No apparent anesthesia complications

## 2017-02-26 NOTE — Discharge Instructions (Signed)
[facility] _______[facility] °INGUINAL HERNIA REPAIR: POST OP INSTRUCTIONS ° °Always review your discharge instruction sheet given to you by the facility where your surgery was performed. °IF YOU HAVE DISABILITY OR FAMILY LEAVE FORMS, YOU MUST BRING THEM TO THE OFFICE FOR PROCESSING.   °DO NOT GIVE THEM TO YOUR DOCTOR. ° °1. A  prescription for pain medication may be given to you upon discharge.  Take your pain medication as prescribed, if needed.  If narcotic pain medicine is not needed, then you may take acetaminophen (Tylenol) or ibuprofen (Advil) as needed. °2. Take your usually prescribed medications unless otherwise directed. °If you need a refill on your pain medication, please contact your pharmacy.  They will contact our office to request authorization. Prescriptions will not be filled after 5 pm or on week-ends. °3. You should follow a light diet the first 24 hours after arrival home, such as soup and crackers, etc.  Be sure to include lots of fluids daily.  Resume your normal diet the day after surgery. °4.Most patients will experience some swelling and bruising around the umbilicus or in the groin and scrotum.  Ice packs and reclining will help.  Swelling and bruising can take several days to resolve.  °6. It is common to experience some constipation if taking pain medication after surgery.  Increasing fluid intake and taking a stool softener (such as Colace) will usually help or prevent this problem from occurring.  A mild laxative (Milk of Magnesia or Miralax) should be taken according to package directions if there are no bowel movements after 48 hours. °7. Unless discharge instructions indicate otherwise, you may remove your bandages 24-48 hours after surgery, and you may shower at that time.  You may have steri-strips (small skin tapes) in place directly over the incision.  These strips should be left on the skin for 7-10 days.  If your surgeon used skin glue on the incision, you may  shower in 24 hours.  The glue will flake off over the next 2-3 weeks.  Any sutures or staples will be removed at the office during your follow-up visit. °8. ACTIVITIES:  You may resume regular (light) daily activities beginning the next day--such as daily self-care, walking, climbing stairs--gradually increasing activities as tolerated.  You may have sexual intercourse when it is comfortable.  Refrain from any heavy lifting or straining until approved by your doctor. ° °a.You may drive when you are no longer taking prescription pain medication, you can comfortably wear a seatbelt, and you can safely maneuver your car and apply brakes. °b.RETURN TO WORK:   °_____________________________________________ ° °9.You should see your doctor in the office for a follow-up appointment approximately 2-3 weeks after your surgery.  Make sure that you call for this appointment within a day or two after you arrive home to insure a convenient appointment time. °10.OTHER INSTRUCTIONS: _________________________ °   _____________________________________ ° °WHEN TO CALL YOUR DOCTOR: °1. Fever over 101.0 °2. Inability to urinate °3. Nausea and/or vomiting °4. Extreme swelling or bruising °5. Continued bleeding from incision. °6. Increased pain, redness, or drainage from the incision ° °The clinic staff is available to answer your questions during regular business hours.  Please Aveer’t hesitate to call and ask to speak to one of the nurses for clinical concerns.  If you have a medical emergency, go to the nearest emergency room or call 911.  A surgeon from [facility] is always on call at the hospital ° ° °[address], [address], [place], [place]  [address] ? ° [address] [address], [place], [place]   [address] °[phone] ? [phone] ? FAX [phone] °Web site: [email or web address] ° °

## 2017-02-26 NOTE — Consult Note (Signed)
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 : I have a hernia that needs to be fixed.  HPI: Bradley Meadows is well-known to me.  He is a 77 year old white male.  He has locally advanced non-small cell lung cancer of the right lung.  He initially underwent chemotherapy and radiation therapy.  He finished this in May 2018.  He currently is on durvalumab.  He gets this every 2 weeks.  His last treatment was back in January 4.  He has had issues with hernias.  However, recently, a right inguinal hernia became incarcerated.  He was having a lot of pain.  Is having some vomiting.  He was admitted for hernia repair.  On admission, his labs looked okay from a oncologic point of view.  His white cell count is 4.9.  Hemoglobin 14.1.  Platelet count 153,000.  His creatinine was 0.7.  Potassium 3.3.  Sodium 128.  We are asked to see him to make sure that there was nothing that need to be done to prepare him for surgery from a hematologic point of view.  Overall, I think he looks fairly good.  He is quite hoarse.  I have spoken with ENT a couple weeks ago.  They will be able to see him and try to help with his hoarseness.  This probably will not get done until February.  Overall, I said that his performance status is ECOG 1.    Past Medical History:  Diagnosis Date  . Arthritis   . Asthma   . CHF (congestive heart failure) (HCC)    ef=45-50%  . COPD (chronic obstructive pulmonary disease) ([place])    home O2  . Coronary artery disease    40-50% mLAD, 40% pRCA, 30% mRCA, occluded CX with retrograde filling 06/01/06 cath  . GERD (gastroesophageal reflux disease)    ulcers  . Goals of care, counseling/discussion 04/07/2016  . History of external beam radiation therapy 04/24/16-06/04/16   chest 60 Gy in 30 fractions  . Hyperlipidemia   . Hypertension   . 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 PVC's (premature ventricular contractions)    2017  . 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 ESOPHAGOGASTRODUODENOSCOPY (EGD) WITH PROPOFOL N/A 02/26/2016   Procedure: ESOPHAGOGASTRODUODENOSCOPY (EGD) WITH PROPOFOL;  Surgeon: Irene Shipper, MD;  Location: [facility];  Service: Endoscopy;  Laterality: N/A;  . EXPLORATORY LAPAROTOMY     for ulcer  "washed me out with a hose pipe"  . EYE SURGERY     cataracts  . HERNIA REPAIR    . INGUINAL HERNIA REPAIR Left 09/26/2016   Procedure: HERNIA REPAIR INGUINAL LEFT;  Surgeon: Jackolyn Confer, MD;  Location: [facility];  Service: General;  Laterality: Left;  With MESH  . IR GENERIC HISTORICAL  04/10/2016   IR US GUIDE VASC ACCESS RIGHT 04/10/2016 Markus Daft, MD [facility]  . IR GENERIC HISTORICAL  04/10/2016   IR FLUORO GUIDE PORT INSERTION RIGHT 04/10/2016 Markus Daft, MD [facility]  . IR GENERIC HISTORICAL  04/10/2016   IR FLUORO RM 30-60 MIN 04/10/2016 Markus Daft, MD [facility]  . IR GENERIC HISTORICAL  04/18/2016   IR FLUORO RM 30-60 MIN 04/18/2016 Greggory Keen, MD [facility]  . JOINT REPLACEMENT  knee both  . PORTACATH PLACEMENT Right 04/10/2016  . VIDEO BRONCHOSCOPY WITH ENDOBRONCHIAL ULTRASOUND N/A 03/26/2016   Procedure: VIDEO BRONCHOSCOPY WITH ENDOBRONCHIAL ULTRASOUND;  Surgeon: Collene Gobble, MD;  Location: [facility];  Service: Thoracic;  Laterality: N/A;  :   Current Facility-Administered Medications:  .  ceFAZolin (ANCEF) IVPB 2g/100 mL premix, 2 g, Intravenous, On Call to OR, Rayburn, Kelly A, PA-C .  dextrose 5 % and 0.9 % NaCl with KCl 40 mEq/L infusion, , Intravenous, Continuous, Barton Dubois, MD, Last Rate: 100 mL/hr at 02/25/17 2035 .  diazepam (VALIUM) injection 5 mg, 5 mg, Intravenous, Q8H PRN, Rayburn, Kelly A, PA-C .  fentaNYL (SUBLIMAZE) injection 25 mcg, 25 mcg, Intravenous, Q2H PRN,  Rayburn, Kelly A, PA-C, 25 mcg at 02/26/17 0425 .  fluticasone (FLONASE) 50 MCG/ACT nasal spray 1 spray, 1 spray, Each Nare, Daily, Barton Dubois, MD, 1 spray at 02/25/17 2215 .  ipratropium-albuterol (DUONEB) 0.5-2.5 (3) MG/3ML nebulizer solution 3 mL, 3 mL, Nebulization, Q6H PRN, Rayburn, Kelly A, PA-C .  metoprolol tartrate (LOPRESSOR) injection 5 mg, 5 mg, Intravenous, Q8H, Barton Dubois, MD, 5 mg at 02/26/17 [phone] .  ondansetron (ZOFRAN-ODT) disintegrating tablet 4 mg, 4 mg, Oral, Q6H PRN **OR** ondansetron (ZOFRAN) injection 4 mg, 4 mg, Intravenous, Q6H PRN, Rayburn, Kelly A, PA-C .  pantoprazole (PROTONIX) injection 40 mg, 40 mg, Intravenous, QHS, Rayburn, Kelly A, PA-C, 40 mg at 02/25/17 2215 .  sodium chloride flush (NS) 0.9 % injection 10-40 mL, 10-40 mL, Intracatheter, PRN, [facility], Wladmits, MD:  . fluticasone  1 spray Each Nare Daily  . metoprolol tartrate  5 mg Intravenous Q8H  . pantoprazole (PROTONIX) IV  40 mg Intravenous QHS  :  Allergies  Allergen Reactions  . No Known Allergies   :  Family History  Problem Relation Age of Onset  . Arthritis Mother   . COPD Mother   . Alcohol abuse Father   . Breast cancer Sister   . Heart disease Paternal Grandfather   . Hyperlipidemia Sister   :  Social History   Socioeconomic History  . Marital status: Divorced    Spouse name: Not on file  . Number of children: 3  . Years of education: 9  . Highest education level: Not on file  Social Needs  . Financial resource strain: Not on file  . Food insecurity - worry: Not on file  . Food insecurity - inability: Not on file  . Transportation needs - medical: Not on file  . Transportation needs - non-medical: Not on file  Occupational History  . Occupation: RetiredResearch officer, political party: RETIRED  Tobacco Use  . Smoking status: Former Smoker    Packs/day: 1.50    Years: 30.00    Pack years: 45.00    Types: Cigarettes    Last attempt to quit: 09/29/2015    Years  since quitting: 1.4  . Smokeless tobacco: Never Used  Substance and Sexual Activity  . Alcohol use: No  . Drug use: No  . Sexual activity: Not Currently  Other Topics Concern  . Not on file  Social History Narrative   Grew up in [place] area.    Left school after 9th grade. No father and had to work.    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       Health Care POA:    Emergency Contact: sister, Hector Brunswick (h) [phone]   End of Life Plan:    Who lives with you: self  Any pets: Lorrin Goodell   Diet: Pt limits sugars and starches and focuses on vegetables an protein. Pt has lost 50 lbs over several years.   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   Seatbelts: Pt reports wearing seatbelt when in vehicle.   Sun Exposure/Protection: Pt does not use sun protectin.   Hobbies: Racing, TV, gardening, construction        :  Pertinent items are noted in HPI.  Exam: As above.  He does have a palpable right inguinal hernia.  His abdomen is somewhat distended.  Bowel sounds are decreased.  Lungs are clear.  Cardiac exam regular rate and rhythm. Patient Vitals for the past 24 hrs:  BP Temp Temp src Pulse Resp SpO2 Height Weight  02/26/17 0621 130/70 - - (!) 103 - - - -  02/26/17 0427 120/72 98.4 F (36.9 C) Oral (!) 102 18 95 % - -  02/25/17 2215 135/89 99.1 F (37.3 C) Oral (!) 116 18 93 % - -  02/25/17 2057 125/73 99.2 F (37.3 C) Oral (!) 111 20 93 % - -  02/25/17 1935 - - - - - - 6' (1.829 m) 185 lb (83.9 kg)  02/25/17 1849 - 98.6 F (37 C) - - - - - -  02/25/17 1200 (!) 159/86 98.1 F (36.7 C) Oral (!) 109 - 95 % - -     Recent Labs    02/25/17 1414  WBC 4.9  HGB 14.1  HCT 40.2  PLT 153   Recent Labs    02/25/17 1517  NA 128*  K 3.3*  CL 88*  CO2 28  GLUCOSE 111*  BUN 29*  CREATININE 0.69  CALCIUM 8.7*    Blood smear review: None  Pathology: None    Assessment and Plan: Mr.  Thrall is a 77 year old white male.  He has a locally advanced non-small cell lung cancer of the right lung.  He has had a very good response to upfront radiation and chemotherapy.  Currently, he is just on immunotherapy.  This does not affect blood counts.  This really does not affect his immune system.  It is not immunosuppressive.  I just do not believe that there will be any complications from this procedure with respect to infection.  I do not think he has a high risk of infection because of his treatments right now.  Clearly, this hernia needs to be repaired.  It is affecting his appetite.  By the x-ray that he had, looks like there might be a partial obstruction.  He does want to have the procedure done with spinal anesthesia.  We will follow him along while he is in the hospital.  I do not see any preparations that need to be made especially for him because of his history of lung cancer and treatment.  I know that he is in fantastic hands with Dr. Excell Seltzer.  I know that the outcome will be very favorable.  Lattie Haw, MD  Bradley Meadows 41:10

## 2017-02-26 NOTE — Anesthesia Postprocedure Evaluation (Signed)
Anesthesia Post Note  Patient: Bradley Meadows  Procedure(s) Performed: HERNIA REPAIR INGUINAL INCARCERATED WITH MESH (Right Groin) INSERTION OF MESH (Right Groin)     Patient location during evaluation: PACU Anesthesia Type: Spinal Level of consciousness: oriented and awake and alert Pain management: pain level controlled Vital Signs Assessment: post-procedure vital signs reviewed and stable Respiratory status: spontaneous breathing, respiratory function stable and patient connected to nasal cannula oxygen Cardiovascular status: blood pressure returned to baseline and stable Postop Assessment: no headache, no backache and no apparent nausea or vomiting Anesthetic complications: no    Last Vitals:  Vitals:   02/26/17 1340 02/26/17 1429  BP: 129/70 126/66  Pulse: (!) 103 100  Resp: 20 20  Temp: 36.8 C 37 C  SpO2: 94% 94%    Last Pain:  Vitals:   02/26/17 1429  TempSrc: Oral  PainSc:                  [name]

## 2017-02-26 NOTE — Anesthesia Procedure Notes (Signed)
Procedure Name: MAC Date/Time: 02/26/2017 9:40 AM Performed by: West Pugh, CRNA Pre-anesthesia Checklist: Patient identified, Emergency Drugs available, Suction available, Patient being monitored and Timeout performed Patient Re-evaluated:Patient Re-evaluated prior to induction Oxygen Delivery Method: Simple face mask Preoxygenation: Pre-oxygenation with 100% oxygen Placement Confirmation: positive ETCO2 and CO2 detector Dental Injury: Teeth and Oropharynx as per pre-operative assessment

## 2017-02-26 NOTE — Progress Notes (Signed)
Dr. Marin Olp at bedside.

## 2017-02-27 ENCOUNTER — Encounter (HOSPITAL_COMMUNITY): Payer: Self-pay | Admitting: General Surgery

## 2017-02-27 ENCOUNTER — Encounter: Payer: Self-pay | Admitting: Hematology & Oncology

## 2017-02-27 DIAGNOSIS — K4030 Unilateral inguinal hernia, with obstruction, without gangrene, not specified as recurrent: Principal | ICD-10-CM

## 2017-02-27 DIAGNOSIS — L8990 Pressure ulcer of unspecified site, unspecified stage: Secondary | ICD-10-CM

## 2017-02-27 DIAGNOSIS — E440 Moderate protein-calorie malnutrition: Secondary | ICD-10-CM

## 2017-02-27 LAB — BASIC METABOLIC PANEL
Anion gap: 4 — ABNORMAL LOW (ref 5–15)
BUN: 24 mg/dL — AB (ref 6–20)
CO2: 27 mmol/L (ref 22–32)
CREATININE: 0.6 mg/dL — AB (ref 0.61–1.24)
Calcium: 8.5 mg/dL — ABNORMAL LOW (ref 8.9–10.3)
Chloride: 104 mmol/L (ref 101–111)
GFR calc Af Amer: >60 mL/min (ref >60)
GFR calc non Af Amer: >60 mL/min (ref >60)
Glucose, Bld: 125 mg/dL — ABNORMAL HIGH (ref 65–99)
Potassium: 4.9 mmol/L (ref 3.5–5.1)
Sodium: 135 mmol/L (ref 135–145)

## 2017-02-27 LAB — CBC
HEMATOCRIT: 35.9 % — AB (ref 39.0–52.0)
HEMOGLOBIN: 12.1 g/dL — AB (ref 13.0–17.0)
MCH: 31 pg (ref 26.0–34.0)
MCHC: 33.7 g/dL (ref 30.0–36.0)
MCV: 92.1 fL (ref 78.0–100.0)
Platelets: 121 10*3/uL — ABNORMAL LOW (ref 150–400)
RBC: 3.9 MIL/uL — ABNORMAL LOW (ref 4.22–5.81)
RDW: 14.6 % (ref 11.5–15.5)
WBC: 7.6 10*3/uL (ref 4.0–10.5)

## 2017-02-27 MED ORDER — HYDROCODONE-ACETAMINOPHEN 5-325 MG PO TABS: 1.0000 | ORAL_TABLET | Freq: Four times a day (QID) | ORAL | 0 refills | Status: DC | PRN

## 2017-02-27 MED ORDER — ENOXAPARIN SODIUM 40 MG/0.4ML ~~LOC~~ SOLN
40.0000 mg | SUBCUTANEOUS | Status: DC
  Administered 2017-02-27: 40 mg via SUBCUTANEOUS
  Filled 2017-02-27: qty 0.4

## 2017-02-27 MED ORDER — HEPARIN SOD (PORK) LOCK FLUSH 100 UNIT/ML IV SOLN
500.0000 [IU] | INTRAVENOUS | Status: AC | PRN
  Administered 2017-02-27: 500 [IU]

## 2017-02-27 MED ORDER — BOOST PLUS PO LIQD
237.0000 mL | Freq: Two times a day (BID) | ORAL | Status: DC
  Filled 2017-02-27: qty 237

## 2017-02-27 NOTE — Progress Notes (Signed)
Initial Nutrition Assessment  DOCUMENTATION CODES:   Non-severe (moderate) malnutrition in context of chronic illness  INTERVENTION:   Boost Plus BID, each supplement provides 360 kcal and 14 grams protein  NUTRITION DIAGNOSIS:   Moderate Malnutrition related to chronic illness, cancer and cancer related treatments as evidenced by moderate muscle depletion, moderate fat depletion.  GOAL:   Patient will meet greater than or equal to 90% of their needs  MONITOR:   PO intake, Supplement acceptance, Weight trends, I & O's, Skin  REASON FOR ASSESSMENT:   Malnutrition Screening Tool    ASSESSMENT:   Pt with PMH of lung cancer s/p radiation and chemotherapy, COPD, HLD, HTN, GERD, and CHF presents with incarcerated R inguinal hernia s/p repair with mesh insertion 02/26/17   Pt was followed closely by [facility], Ernestene Kiel and Jennet Maduro. Pt was having difficulty swallowing thus experienced unintentional weight loss during this time. Pt states he was drinking multiple Boost daily but is unable to afford them at this time. He was provided 3 complimentary cases of Ensure Plus.   Spoke with pt and pt's daughter at bedside. Pt reports he feels 100% better than prior to his repair. Reporting due to the hernia he was unable to eat for the past 4-5 days. Pt reports a good appetite now, eating breakfast of 2 eggs, a piece of bacon, coffee, and orange juice. Per chart, pt consumed 100% of this meal.  Pt's daughter reports his swallowing has improved, however, his food gets "stuck" occasionally. This can impact how much he consumes at a meal time.    Pt reports a UBW of 184 lbs and that he has slowly been increasing in weight since May. Pt's weight on admission was reported.   Labs reviewed Medications reviewed; Protonix, Carafate   NUTRITION - FOCUSED PHYSICAL EXAM:    Most Recent Value  Orbital Region  Moderate depletion  Upper Arm Region  Moderate depletion   Thoracic and Lumbar Region  No depletion  Buccal Region  Moderate depletion  Temple Region  Moderate depletion  Clavicle Bone Region  Moderate depletion  Clavicle and Acromion Bone Region  Moderate depletion  Scapular Bone Region  Unable to assess  Dorsal Hand  Unable to assess  Patellar Region  Mild depletion  Anterior Thigh Region  Mild depletion  Posterior Calf Region  Mild depletion  Edema (RD Assessment)  None      Diet Order:  DIET SOFT Room service appropriate? Yes; Fluid consistency: Thin  EDUCATION NEEDS:   Not appropriate for education at this time  Skin:  Skin Assessment: Skin Integrity Issues: Skin Integrity Issues:: Stage II Stage II: sacrum  Last BM:  02/26/17  Height:   Ht Readings from Last 1 Encounters:  02/25/17 6' (1.829 m)   Weight:   Wt Readings from Last 1 Encounters:  02/25/17 185 lb (83.9 kg)   Ideal Body Weight:  80.9 kg  BMI:  Body mass index is 25.09 kg/m.  Estimated Nutritional Needs:   Kcal:  2000-2200  Protein:  100-110 grams  Fluid:  >/= 2 L/d  Parks Ranger, MS, RDN, LDN 02/27/2017 11:15 AM

## 2017-02-27 NOTE — Progress Notes (Signed)
[facility] Progress Note  1 Day Post-Op  Subjective: CC-  Doing well this morning, daughter at bedside. States that he feels much better than prior to surgery. He denies any pain. He has been ambulating in his room without issues. Tolerating clears. Denies n/v. States that he has had a few episodes of incontinent loose stools; daughter concerned about caring for this if it does not resolve before he goes home.  Objective: Vital signs in last 24 hours: Temp:  [98 F (36.7 C)-98.8 F (37.1 C)] 98.2 F (36.8 C) (01/25 0606) Pulse Rate:  [60-103] 60 (01/25 0606) Resp:  [18-20] 18 (01/25 0606) BP: (114-146)/(57-82) 115/64 (01/25 0606) SpO2:  [92 %-98 %] 98 % (01/25 0606) Last BM Date: 02/26/17  Intake/Output from previous day: 01/24 0701 - 01/25 0700 In: 1620 [P.O.:850; I.V.:770] Out: 1201 [Urine:1200; Stool:1] Intake/Output this shift: No intake/output data recorded.  PE: Gen:  Alert, NAD, pleasant HEENT: EOM's intact, pupils equal and round Card:  RRR, no M/G/R heard Pulm:  effort normal on supplemental O2 Abd: Soft, NT/ND, +BS. Right inguinal incision cdi without erythema or drainage, no ehrnia Psych: A&Ox3  Skin: no rashes noted, warm and dry  Lab Results:  Recent Labs    02/26/17 0730 02/27/17 0320  WBC 4.7 7.6  HGB 14.0 12.1*  HCT 40.2 35.9*  PLT 139* 121*   BMET Recent Labs    02/26/17 0730 02/27/17 0320  NA 134* 135  K 4.1 4.9  CL 96* 104  CO2 29 27  GLUCOSE 131* 125*  BUN 27* 24*  CREATININE 0.70 0.60*  CALCIUM 9.0 8.5*   PT/INR No results for input(s): LABPROT, INR in the last 72 hours. CMP     Component Value Date/Time   NA 135 02/27/2017 0320   NA 133 02/06/2017 0757   NA 129 (L) 01/23/2017 1050   K 4.9 02/27/2017 0320   K 3.9 02/06/2017 0757   K 3.4 (L) 01/23/2017 1050   CL 104 02/27/2017 0320   CL 90 (L) 02/06/2017 0757   CO2 27 02/27/2017 0320   CO2 29 02/06/2017 0757   CO2 26 01/23/2017 1050   GLUCOSE 125 (H)  02/27/2017 0320   GLUCOSE 104 02/06/2017 0757   BUN 24 (H) 02/27/2017 0320   BUN 8 02/06/2017 0757   BUN 12.4 01/23/2017 1050   CREATININE 0.60 (L) 02/27/2017 0320   CREATININE 0.9 02/06/2017 0757   CREATININE 0.8 01/23/2017 1050   CALCIUM 8.5 (L) 02/27/2017 0320   CALCIUM 9.3 02/06/2017 0757   CALCIUM 8.9 01/23/2017 1050   PROT 6.3 (L) 02/25/2017 1517   PROT 6.3 (L) 02/06/2017 0757   PROT 6.0 (L) 01/23/2017 1050   ALBUMIN 3.5 02/25/2017 1517   ALBUMIN 3.5 02/06/2017 0757   ALBUMIN 3.6 01/23/2017 1050   AST 23 02/25/2017 1517   AST 54 (H) 02/06/2017 0757   AST 39 (H) 01/23/2017 1050   ALT 23 02/25/2017 1517   ALT 43 02/06/2017 0757   ALT 27 01/23/2017 1050   ALKPHOS 64 02/25/2017 1517   ALKPHOS 79 02/06/2017 0757   ALKPHOS 80 01/23/2017 1050   BILITOT 1.2 02/25/2017 1517   BILITOT 0.90 02/06/2017 0757   BILITOT 0.51 01/23/2017 1050   GFRNONAA >60 02/27/2017 0320   GFRNONAA 91 12/01/2016 1447   GFRAA >60 02/27/2017 0320   GFRAA 106 12/01/2016 1447   Lipase  No results found for: LIPASE     Studies/Results: Dg Abd 2 Views  Result Date: 02/25/2017 CLINICAL DATA:  77 year old male with a history of abdominal pain EXAM: ABDOMEN - 2 VIEW COMPARISON:  PET-CT 08/08/2016 FINDINGS: Distended small bowel loops with decubitus imaging demonstrating multiple air-fluid levels. Formed stool within the right abdomen, potentially within dilated proximal colon. Relative absence of distal colonic gas. Osteopenia. IMPRESSION: Evidence of bowel obstruction with multiple air-fluid levels and distended loops of small bowel. Further evaluation with contrast-enhanced CT recommended. Electronically Signed   By: Corrie Mckusick D.O.   On: 02/25/2017 16:37    Anti-infectives: Anti-infectives (From admission, onward)   Start     Dose/Rate Route Frequency Ordered Stop   02/26/17 0959  ceFAZolin (ANCEF) 2-4 GM/100ML-% IVPB    Comments:  [place] Session   : cabinet override      02/26/17 9767  02/26/17 0953   02/26/17 0600  ceFAZolin (ANCEF) IVPB 2g/100 mL premix     2 g 200 mL/hr over 30 Minutes Intravenous On call to O.R. 02/25/17 1624 02/26/17 1030       Assessment/Plan Squamous Cell Lung Cancer COPD CAD CHF HTN GERD HLD  Incarcerated rt inguinal hernia S/p HERNIA REPAIR INGUINAL INCARCERATED WITH MESH INSERTION OF MESH 1/24 Dr. Excell Seltzer - POD 1 - pain controlled, tolerating clears, having bowel function  ID - ancef perioperative FEN - IVF, soft diet VTE - SCDs, lovenox Foley - none Follow up - Dr. Excell Seltzer  Plan - Advance to soft diet. PT consult pending. Encourage OOB and IS today. Will plan to recheck patient later today for possible discharge. Follow up and discharge instructions on AVS; rx on chart   LOS: 2 days    Wellington Hampshire , [facility] 02/27/2017, 8:09 AM Pager: [phone] Consults: [phone] Mon-Fri 7:00 am-4:30 pm Sat-Sun 7:00 am-11:30 am

## 2017-02-27 NOTE — Progress Notes (Signed)
  PROGRESS NOTE  MONTRAIL MEHRER DXI:[id number] DOB: May 24, 1940 DOA: 02/25/2017 PCP: Susy Frizzle, MD  Brief Narrative: 77yom presented with abd pain. Admitted for incarcerated inguinal hernia. TR[facility] consulted for medical management.  Assessment/Plan Incarcerated right inguinal hernia  - s/p surgery 11/24 - management per surgery  Essential HTN - stable, continue oral metoprolol  Hyponatremia - resolved, likely secondary to poor oral intake  Chronic combined CHF - appears compensated 1/25, continue beta-blocker  Chronic hypoxic resp failure, COPD - appears stable 1/25; continue nebs, oxygen supplementation  Lung cancer - per Dr. Marin Olp  Thrombocytopenia, would expect spontaneous recovery - per Dr. Marin Olp.  [facility] for discharge from medical standpoint. I will sign off. Please call with questions.  DVT prophylaxis: per surgery Code Status: full   Murray Hodgkins, MD  [facility] Direct contact: [phone] --Via amion app OR  --[email or web address]; password [email or web address]  7PM-7AM contact night coverage as above 02/27/2017, 3:07 PM  LOS: 2 days    Interval history/Subjective: Feels fine today. Breathing fine.  Objective: Vitals:  Vitals:   02/27/17 1029 02/27/17 1411  BP: (!) 123/57 108/62  Pulse: 85 71  Resp: 16 16  Temp: 98.7 F (37.1 C) 98 F (36.7 C)  SpO2: 97% 99%    Exam:  Constitutional:   . Appears calm and comfortable Respiratory:  . CTA bilaterally, no w/r/r.  . Respiratory effort normal.  Cardiovascular:  . RRR, no m/r/g Psychiatric:  . Mental status o Mood, affect appropriate  I have personally reviewed the following:   Labs:  BMP unremarkable 1/25  Hgb stable  Plts 139 >> 121  Scheduled Meds: . atorvastatin  40 mg Oral q1800  . enoxaparin (LOVENOX) injection  40 mg Subcutaneous Q24H  . fluticasone  1 spray Each Nare Daily  . lactose free nutrition  237 mL Oral BID BM  . metoprolol tartrate  25 mg Oral BID  . pantoprazole  40 mg  Oral Daily  . sucralfate  1 g Oral BID AC   Continuous Infusions:   Active Problems:   Incarcerated right inguinal hernia   Pressure injury of skin   Malnutrition of moderate degree   LOS: 2 days

## 2017-02-27 NOTE — Evaluation (Signed)
Physical Therapy Evaluation Patient Details Name: Bradley Meadows MRN: [id number] DOB: 07/08/1940 Today's Date: 02/27/2017   History of Present Illness  Pt is a 77 year old male with hx of COPD, CHF, CAD, squamous cell lung cancer and s/p  HERNIA REPAIR INGUINAL INCARCERATED WITH MESH 02/26/17  Clinical Impression  Pt admitted with above diagnosis. Pt currently with functional limitations due to the deficits listed below (see PT Problem List).  Pt will benefit from skilled PT to increase their independence and safety with mobility to allow discharge to the venue listed below.  Pt assisted with ambulating in the hallway and states he will have family assist upon d/c home.  Pt typically on 4L O2 [place] at baseline.     Follow Up Recommendations No PT follow up    Equipment Recommendations  Rolling walker with 5" wheels    Recommendations for Other Services       Precautions / Restrictions Precautions Precautions: Fall Precaution Comments: 4L O2 [place] baseline      Mobility  Bed Mobility Overal bed mobility: Needs Assistance Bed Mobility: Supine to Sit     Supine to sit: Supervision        Transfers Overall transfer level: Needs assistance Equipment used: Rolling walker (2 wheeled) Transfers: Sit to/from Stand Sit to Stand: Min guard         General transfer comment: min/guard for safety  Ambulation/Gait Ambulation/Gait assistance: Min guard Ambulation Distance (Feet): 120 Feet Assistive device: Rolling walker (2 wheeled) Gait Pattern/deviations: Step-through pattern;Decreased stride length;Trunk flexed     General Gait Details: verbal cues for RW positioning and posture, remained on 4L O2 [place]  Stairs            Wheelchair Mobility    Modified Rankin (Stroke Patients Only)       Balance Overall balance assessment: Needs assistance         Standing balance support: Bilateral upper extremity supported Standing balance-Leahy Scale: Poor Standing balance  comment: requires UE support at this time                             Pertinent Vitals/Pain Pain Assessment: No/denies pain    Home Living Family/patient expects to be discharged to:: Private residence Living Arrangements: Children(daughter)   Type of Home: House Home Access: Stairs to enter   Technical brewer of Steps: 2 Home Layout: One level Home Equipment: Environmental consultant - 2 wheels      Prior Function Level of Independence: Independent               Hand Dominance        Extremity/Trunk Assessment        Lower Extremity Assessment Lower Extremity Assessment: Generalized weakness       Communication   Communication: No difficulties  Cognition Arousal/Alertness: Awake/alert Behavior During Therapy: WFL for tasks assessed/performed Overall Cognitive Status: Within Functional Limits for tasks assessed                                        General Comments      Exercises     Assessment/Plan    PT Assessment Patient needs continued PT services  PT Problem List Decreased strength;Decreased mobility;Decreased activity tolerance;Decreased balance;Decreased knowledge of use of DME       PT Treatment Interventions Therapeutic activities;DME instruction;Gait training;Therapeutic exercise;Functional mobility  training;Patient/family education;Balance training;Stair training    PT Goals (Current goals can be found in the Care Plan section)  Acute Rehab PT Goals PT Goal Formulation: With patient Time For Goal Achievement: 03/13/17 Potential to Achieve Goals: Good    Frequency Min 3X/week   Barriers to discharge        Co-evaluation               AM-PAC PT "6 Clicks" Daily Activity  Outcome Measure Difficulty turning over in bed (including adjusting bedclothes, sheets and blankets)?: None Difficulty moving from lying on back to sitting on the side of the bed? : A Lot Difficulty sitting down on and standing up from a  chair with arms (e.g., wheelchair, bedside commode, etc,.)?: A Lot Help needed moving to and from a bed to chair (including a wheelchair)?: A Little Help needed walking in hospital room?: A Little Help needed climbing 3-5 steps with a railing? : A Lot 6 Click Score: 16    End of Session Equipment Utilized During Treatment: Gait belt;Oxygen Activity Tolerance: Patient tolerated treatment well Patient left: in chair;with call bell/phone within reach;with family/visitor present   PT Visit Diagnosis: Difficulty in walking, not elsewhere classified (R26.2)    Time: 6967-8938 PT Time Calculation (min) (ACUTE ONLY): 14 min   Charges:   PT Evaluation $PT Eval Low Complexity: 1 Low     PT G Codes:        Carmelia Bake, PT, DPT 02/27/2017 Pager: [phone]  York Ram E 02/27/2017, 12:42 PM

## 2017-02-27 NOTE — Progress Notes (Signed)
Bradley Meadows did well with surgery yesterday.  He did have spinal anesthesia.  The surgery went without any complications.  There is no evidence of bowel ischemia.  Hopefully, he will go home today.  His labs look good.  His white cell count 7.6.  Hemoglobin 12.1.  Platelet count 121,000.  His creatinine is 0.6.  Potassium 4.9.  When I examined the hernia repair site, it was nice and clean.  There is no erythema.  There is really no swelling.  He had no tenderness at the site of surgery.  He did have bowel sounds.  His abdomen was not as distended.  His lungs were clear.  Cardiac exam regular rate and rhythm.  Bradley Meadows is due for his next dose of durvalumab I think next week.  I will place this back a couple more weeks.  I would think this would be a problem.  He is still due for his PET scan in a week or so.  Hopefully, he will go home today.  I did talk to his daughter in the hall today.  Lattie Haw, MD

## 2017-03-03 NOTE — Discharge Summary (Signed)
[facility] Discharge Summary   Patient ID: Bradley Meadows MRN: [id number] DOB/AGE: Dec 25, 1940 77 y.o.  Admit date: 02/25/2017 Discharge date: 02/27/2017  Admitting Diagnosis: Incarcerated right inguinal hernia   Discharge Diagnosis Patient Active Problem List   Diagnosis Date Noted  . Pressure injury of skin 02/27/2017  . Malnutrition of moderate degree 02/27/2017  . Port-A-Cath in place 12/26/2016  . Squamous cell lung cancer, right ([place]) 04/07/2016  . 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 Goals of care, counseling/discussion 04/07/2016  . Esophageal mass   . Abnormal CT of the chest   . Gastroparesis   . History of peptic ulcer 04/13/2014  . Depression 06/22/2013  . Incarcerated right inguinal hernia 01/14/2013  . Anemia 06/03/2012  . Chronic hyponatremia 06/03/2012  . Dizziness 05/26/2012  . Tobacco abuse 08/29/2010  . Mixed hyperlipidemia 03/26/2007  . Chronic systolic heart failure ([place]) 12/03/2006  . CAD (coronary artery disease), native coronary artery 09/04/2006  . COPD mixed type ([place]) 04/21/2006  . ERECTILE DYSFUNCTION 04/02/2006  . HYPERTENSION, BENIGN SYSTEMIC 04/02/2006  . Candiss Norse SITES 04/02/2006    Consultants Oncology Internal medicine  Imaging: No results found.  Procedures Dr. Excell Seltzer (02/28/16) - HERNIA REPAIR INGUINAL INCARCERATED WITH MESH INSERTION OF MESH   Hospital Course:  Bradley Meadows is a 77yo male PMH squamous cell lung cancer and COPD, who was directly admitted to [facility] 1/23 from Dr. Gala Lewandowsky office with an incarcerated right inguinal hernia.  Internal medicine and oncology were consulted for assistance with the patient's comorbidities. Patient was admitted and underwent procedure listed above.  Tolerated procedure well and was transferred to the floor.  Diet was advanced as tolerated.  Patient worked with therapies during this admission. On POD1, the patient was  voiding well, tolerating diet, ambulating well, pain well controlled, vital signs stable, incisions c/d/i and felt stable for discharge home.  Patient will follow up in our office in 2 weeks and knows to call with questions or concerns.   Allergies as of 02/27/2017      Reactions   No Known Allergies       Medication List    TAKE these medications   atorvastatin 40 MG tablet Commonly known as:  LIPITOR TAKE 1 TABLET(40 MG) BY MOUTH DAILY   CARAFATE 1 GM/10ML suspension Generic drug:  sucralfate Take 10 mLs (1 g total) 2 (two) times daily by mouth.   HYDROcodone-acetaminophen 5-325 MG tablet Commonly known as:  NORCO/VICODIN Take 1 tablet by mouth every 6 (six) hours as needed for severe pain. What changed:    when to take this  reasons to take this   ipratropium-albuterol 0.5-2.5 (3) MG/3ML Soln Commonly known as:  DUONEB USE 1 VIAL VIA NEBULIZER EVERY 6 HOURS AS NEEDED FOR SHORTNESS OF BREATH OR WHEEZING   COMBIVENT RESPIMAT 20-100 MCG/ACT Aers respimat Generic drug:  Ipratropium-Albuterol INHALE 1 PUFF BY MOUTH EVERY 6 HOURS AS NEEDED FOR WHEEZING   metoprolol tartrate 25 MG tablet Commonly known as:  LOPRESSOR TAKE 1 TABLET(25 MG) BY MOUTH TWICE DAILY   multivitamin tablet Take 1 tablet by mouth daily. Centrum Silver   omeprazole 20 MG capsule Commonly known as:  PRILOSEC TAKE ONE CAPSULE BY MOUTH EVERY DAY   STIOLTO RESPIMAT 2.5-2.5 MCG/ACT Aers Generic drug:  Tiotropium Bromide-Olodaterol INHALE 2 PUFFS BY MOUTH DAILY   triamterene-hydrochlorothiazide 37.5-25 MG tablet Commonly known as:  MAXZIDE-25 TK 1 T PO QD FOR 3 DAYS PRF LEG  SWELLING        Follow-up Information    Excell Seltzer, MD. Go on 03/12/2017.   Specialty:  General Surgery Why:  Your appointment is 03/12/17 at 9:45am. Please arrive 30 minutes prior to your appointment to check in and fill out paperwork. Bring photo ID and insurance information. Contact information: [address] [place] [address] [phone]           Signed: Wellington Hampshire, [facility] 03/03/2017, 10:39 AM Pager: [phone] Consults: [phone] Mon-Fri 7:00 am-4:30 pm Sat-Sun 7:00 am-11:30 am

## 2017-03-06 ENCOUNTER — Ambulatory Visit (HOSPITAL_COMMUNITY): Payer: Medicare Other

## 2017-03-06 ENCOUNTER — Ambulatory Visit: Payer: Medicare Other

## 2017-03-06 ENCOUNTER — Other Ambulatory Visit: Payer: Medicare Other

## 2017-03-13 ENCOUNTER — Ambulatory Visit (INDEPENDENT_AMBULATORY_CARE_PROVIDER_SITE_OTHER): Payer: Medicare Other | Admitting: Family Medicine

## 2017-03-13 ENCOUNTER — Encounter: Payer: Self-pay | Admitting: Family Medicine

## 2017-03-13 VITALS — BP 92/50 | HR 95 | Temp 98.5°F | Resp 16 | Ht 72.0 in | Wt 164.0 lb

## 2017-03-13 DIAGNOSIS — I952 Hypotension due to drugs: Secondary | ICD-10-CM

## 2017-03-13 NOTE — Progress Notes (Signed)
Subjective:    Patient ID: Bradley Meadows, male    DOB: 08/10/1940, 77 y.o.   MRN: [id number]  HPI Recently had emergency surgery for an incarcerated hernia.  Since recovery for his surgery, his blood pressure has been very low.  Today is 92/50.  He reports dizziness upon standing.  He reports feeling lightheaded.  He denies any chest pain shortness of breath or dyspnea on exertion beyond his baseline.  He denies any blood in his stool or melena.  He denies any fevers or chills or signs of any infection. Past Medical History:  Diagnosis Date  . Arthritis   . Asthma   . CHF (congestive heart failure) (HCC)    ef=45-50%  . COPD (chronic obstructive pulmonary disease) ([place])    home O2  . Coronary artery disease    40-50% mLAD, 40% pRCA, 30% mRCA, occluded CX with retrograde filling 06/01/06 cath  . GERD (gastroesophageal reflux disease)    ulcers  . Goals of care, counseling/discussion 04/07/2016  . History of external beam radiation therapy 04/24/16-06/04/16   chest 60 Gy in 30 fractions  . Hyperlipidemia   . Hypertension   . 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 PVC's (premature ventricular contractions)    2017  . Squamous cell lung cancer, right ([place]) 04/07/2016   Past Surgical History:  Procedure Laterality Date  . ESOPHAGOGASTRODUODENOSCOPY (EGD) WITH PROPOFOL N/A 02/26/2016   Procedure: ESOPHAGOGASTRODUODENOSCOPY (EGD) WITH PROPOFOL;  Surgeon: Irene Shipper, MD;  [facility];  Service: Endoscopy;  Laterality: N/A;  . EXPLORATORY LAPAROTOMY     for ulcer  "washed me out with a hose pipe"  . EYE SURGERY     cataracts  . HERNIA REPAIR    . INGUINAL HERNIA REPAIR Left 09/26/2016   Procedure: HERNIA REPAIR INGUINAL LEFT;  Surgeon: Jackolyn Confer, MD;  [facility];  Service: General;  Laterality: Left;  With MESH  . INGUINAL HERNIA REPAIR Right 02/26/2017   Procedure: HERNIA REPAIR INGUINAL INCARCERATED WITH MESH;   Surgeon: Excell Seltzer, MD;  [facility];  Service: General;  Laterality: Right;  . INSERTION OF MESH Right 02/26/2017   Procedure: INSERTION OF MESH;  Surgeon: Excell Seltzer, MD;  [facility];  Service: General;  Laterality: Right;  . IR GENERIC HISTORICAL  04/10/2016   IR US GUIDE VASC ACCESS RIGHT 04/10/2016 Markus Daft, MD [facility]  . IR GENERIC HISTORICAL  04/10/2016   IR FLUORO GUIDE PORT INSERTION RIGHT 04/10/2016 Markus Daft, MD [facility]  . IR GENERIC HISTORICAL  04/10/2016   IR FLUORO RM 30-60 MIN 04/10/2016 Markus Daft, MD [facility]  . IR GENERIC HISTORICAL  04/18/2016   IR FLUORO RM 30-60 MIN 04/18/2016 Greggory Keen, MD [facility]  . JOINT REPLACEMENT     knee both  . PORTACATH PLACEMENT Right 04/10/2016  . VIDEO BRONCHOSCOPY WITH ENDOBRONCHIAL ULTRASOUND N/A 03/26/2016   Procedure: VIDEO BRONCHOSCOPY WITH ENDOBRONCHIAL ULTRASOUND;  Surgeon: Collene Gobble, MD;  [facility];  Service: Thoracic;  Laterality: N/A;   Current Outpatient Medications on File Prior to Visit  Medication Sig Dispense Refill  . aspirin EC 81 MG tablet Take 81 mg by mouth daily.    [name] atorvastatin (LIPITOR) 40 MG tablet TAKE 1 TABLET(40 MG) BY MOUTH DAILY 90 tablet 0  . CARAFATE 1 GM/10ML suspension Take 10 mLs (1 g total) 2 (two) times daily by mouth. 420 mL 6  . COMBIVENT  RESPIMAT 20-100 MCG/ACT AERS respimat INHALE 1 PUFF BY MOUTH EVERY 6 HOURS AS NEEDED FOR WHEEZING 4 g 5  . HYDROcodone-acetaminophen (NORCO/VICODIN) 5-325 MG tablet Take 1 tablet by mouth every 6 (six) hours as needed for severe pain. 20 tablet 0  . ipratropium-albuterol (DUONEB) 0.5-2.5 (3) MG/3ML SOLN USE 1 VIAL VIA NEBULIZER EVERY 6 HOURS AS NEEDED FOR SHORTNESS OF BREATH OR WHEEZING 990 mL 0  . metoprolol tartrate (LOPRESSOR) 25 MG tablet TAKE 1 TABLET(25 MG) BY MOUTH TWICE DAILY 180 tablet 1  . Multiple Vitamin (MULTIVITAMIN) tablet Take 1 tablet by mouth daily. Centrum Silver    . omeprazole (PRILOSEC) 20 MG  capsule TAKE ONE CAPSULE BY MOUTH EVERY DAY 90 capsule 1  . STIOLTO RESPIMAT 2.5-2.5 MCG/ACT AERS INHALE 2 PUFFS BY MOUTH DAILY 4 g 5  . triamterene-hydrochlorothiazide (MAXZIDE-25) 37.5-25 MG tablet TK 1 T PO QD FOR 3 DAYS PRF LEG SWELLING  3   No current facility-administered medications on file prior to visit.    Allergies  Allergen Reactions  . No Known Allergies    Social History   Socioeconomic History  . Marital status: Divorced    Spouse name: Not on file  . Number of children: 3  . Years of education: 9  . Highest education level: Not on file  Social Needs  . Financial resource strain: Not on file  . Food insecurity - worry: Not on file  . Food insecurity - inability: Not on file  . Transportation needs - medical: Not on file  . Transportation needs - non-medical: Not on file  Occupational History  . Occupation: RetiredResearch officer, political party: RETIRED  Tobacco Use  . Smoking status: Former Smoker    Packs/day: 1.50    Years: 30.00    Pack years: 45.00    Types: Cigarettes    Last attempt to quit: 09/29/2015    Years since quitting: 1.4  . Smokeless tobacco: Never Used  Substance and Sexual Activity  . Alcohol use: No  . Drug use: No  . Sexual activity: Not Currently  Other Topics Concern  . Not on file  Social History Narrative   Grew up in [place] area.    Left school after 9th grade. No father and had to work.    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       Health Care POA:    Emergency Contact: sister, Hector Brunswick (h) [phone]   End of Life Plan:    Who lives with you: self   Any pets: Lorrin Goodell   Diet: Pt limits sugars and starches and focuses on vegetables an protein. Pt has lost 50 lbs over several years.   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   Seatbelts: Pt reports wearing seatbelt when in vehicle.   Sun Exposure/Protection: Pt does not use sun protectin.    Hobbies: Racing, TV, gardening, Architect            Review of Systems  All other systems reviewed and are negative.      Objective:   Physical Exam  Cardiovascular: Normal rate, regular rhythm and normal heart sounds.  Pulmonary/Chest: Effort normal and breath sounds normal. No respiratory distress. He has no wheezes. He has no rales.  Abdominal: Soft. Bowel sounds are normal. He exhibits no distension. There is no tenderness. There is no rebound.    Vitals reviewed.         Assessment &  Plan:  Hypotension due to drugs  Patient's blood pressure is low.  There is no evidence of an infection.  He denies any fevers or chills or coughing or dysuria.  He has had no further diarrhea since discharge from the hospital.  Therefore I do not believe his low blood pressures from sepsis.  He denies any blood loss.  Therefore I will discontinue his Maxide.  I will reduce his Lopressor to 12.5 mg twice daily and recheck blood pressure on Monday.  If blood pressure still low Monday, we will discontinue Lopressor altogether and also check CBC as well as a workup for possible underlying infection.  However today at the patient appears to be overmedicated as clinically he appears well and is laughing and smiling and in no apparent distress.  I believe that the blood pressure medication is too much given all the recent stress his body has been under

## 2017-03-16 ENCOUNTER — Encounter: Payer: Self-pay | Admitting: Family Medicine

## 2017-03-18 ENCOUNTER — Ambulatory Visit (HOSPITAL_COMMUNITY)
Admission: RE | Admit: 2017-03-18 | Discharge: 2017-03-18 | Disposition: A | Discharge status: Home / Self Care | Payer: Medicare Other | Source: Ambulatory Visit | Attending: Hematology & Oncology | Admitting: Hematology & Oncology

## 2017-03-18 DIAGNOSIS — I700 Atherosclerosis of aorta: Secondary | ICD-10-CM | POA: Diagnosis not present

## 2017-03-18 DIAGNOSIS — K8020 Calculus of gallbladder without cholecystitis without obstruction: Secondary | ICD-10-CM | POA: Insufficient documentation

## 2017-03-18 DIAGNOSIS — I2510 Atherosclerotic heart disease of native coronary artery without angina pectoris: Secondary | ICD-10-CM | POA: Diagnosis not present

## 2017-03-18 DIAGNOSIS — C3491 Malignant neoplasm of unspecified part of right bronchus or lung: Secondary | ICD-10-CM | POA: Diagnosis not present

## 2017-03-18 DIAGNOSIS — C3490 Malignant neoplasm of unspecified part of unspecified bronchus or lung: Secondary | ICD-10-CM | POA: Diagnosis not present

## 2017-03-18 DIAGNOSIS — J439 Emphysema, unspecified: Secondary | ICD-10-CM | POA: Diagnosis not present

## 2017-03-18 LAB — GLUCOSE, CAPILLARY: Glucose-Capillary: 92 mg/dL (ref 65–99)

## 2017-03-18 MED ORDER — FLUDEOXYGLUCOSE F - 18 (FDG) INJECTION
8.2000 | Freq: Once | INTRAVENOUS | Status: AC | PRN
  Administered 2017-03-18: 8.2 via INTRAVENOUS

## 2017-03-20 ENCOUNTER — Inpatient Hospital Stay: Payer: Medicare Other

## 2017-03-20 ENCOUNTER — Other Ambulatory Visit: Payer: Self-pay

## 2017-03-20 ENCOUNTER — Encounter: Payer: Self-pay | Admitting: Hematology & Oncology

## 2017-03-20 ENCOUNTER — Inpatient Hospital Stay: Payer: Medicare Other | Attending: Hematology & Oncology | Admitting: Hematology & Oncology

## 2017-03-20 DIAGNOSIS — R42 Dizziness and giddiness: Secondary | ICD-10-CM | POA: Diagnosis not present

## 2017-03-20 DIAGNOSIS — Z5112 Encounter for antineoplastic immunotherapy: Secondary | ICD-10-CM | POA: Insufficient documentation

## 2017-03-20 DIAGNOSIS — C3491 Malignant neoplasm of unspecified part of right bronchus or lung: Secondary | ICD-10-CM

## 2017-03-20 LAB — CBC WITH DIFFERENTIAL (CANCER CENTER ONLY)
BASOS ABS: 0 10*3/uL (ref 0.0–0.1)
BASOS PCT: 1 %
EOS PCT: 5 %
Eosinophils Absolute: 0.3 10*3/uL (ref 0.0–0.5)
HCT: 34 % — ABNORMAL LOW (ref 38.7–49.9)
Hemoglobin: 11.3 g/dL — ABNORMAL LOW (ref 13.0–17.1)
LYMPHS PCT: 20 %
Lymphs Abs: 1 10*3/uL (ref 0.9–3.3)
MCH: 31.4 pg (ref 28.0–33.4)
MCHC: 33.2 g/dL (ref 32.0–35.9)
MCV: 94.4 fL (ref 82.0–98.0)
MONO ABS: 0.6 10*3/uL (ref 0.1–0.9)
Monocytes Relative: 11 %
Neutro Abs: 3.1 10*3/uL (ref 1.5–6.5)
Neutrophils Relative %: 63 %
PLATELETS: 180 10*3/uL (ref 145–400)
RBC: 3.6 MIL/uL — AB (ref 4.20–5.70)
RDW: 14.9 % (ref 11.1–15.7)
WBC: 5 10*3/uL (ref 4.0–10.0)

## 2017-03-20 LAB — CMP (CANCER CENTER ONLY)
ALBUMIN: 2.9 g/dL — AB (ref 3.5–5.0)
ALT: 20 U/L (ref 10–47)
AST: 22 U/L (ref 11–38)
Alkaline Phosphatase: 67 U/L (ref 26–84)
Anion gap: 7 (ref 5–15)
BUN: 12 mg/dL (ref 7–22)
CALCIUM: 8.5 mg/dL (ref 8.0–10.3)
CO2: 32 mmol/L (ref 18–33)
CREATININE: 0.5 mg/dL — AB (ref 0.60–1.20)
Chloride: 100 mmol/L (ref 98–108)
GLUCOSE: 91 mg/dL (ref 73–118)
Potassium: 3.4 mmol/L (ref 3.3–4.7)
SODIUM: 139 mmol/L (ref 128–145)
TOTAL PROTEIN: 5.1 g/dL — AB (ref 6.4–8.1)
Total Bilirubin: 0.6 mg/dL (ref 0.2–1.6)

## 2017-03-20 MED ORDER — SODIUM CHLORIDE 0.9% FLUSH
10.0000 mL | INTRAVENOUS | Status: DC | PRN
  Administered 2017-03-20: 10 mL
  Filled 2017-03-20: qty 10

## 2017-03-20 MED ORDER — SODIUM CHLORIDE 0.9 % IV SOLN
860.0000 mg | Freq: Once | INTRAVENOUS | Status: AC
  Administered 2017-03-20: 860 mg via INTRAVENOUS
  Filled 2017-03-20: qty 10

## 2017-03-20 MED ORDER — MECLIZINE HCL 12.5 MG PO TABS: 12.5000 mg | ORAL_TABLET | Freq: Three times a day (TID) | ORAL | 4 refills | Status: DC | PRN

## 2017-03-20 MED ORDER — HEPARIN SOD (PORK) LOCK FLUSH 100 UNIT/ML IV SOLN
500.0000 [IU] | Freq: Once | INTRAVENOUS | Status: AC | PRN
  Administered 2017-03-20: 500 [IU]
  Filled 2017-03-20: qty 5

## 2017-03-20 MED ORDER — SODIUM CHLORIDE 0.9 % IV SOLN
Freq: Once | INTRAVENOUS | Status: AC
  Administered 2017-03-20: 15:00:00 via INTRAVENOUS

## 2017-03-20 NOTE — Progress Notes (Signed)
Hematology and Oncology Follow Up Visit  Bradley Meadows [id number] Aug 24, 1940 77 y.o. 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  He needed emergency surgery back in January for an incarcerated hernia.  This was in the right inguinal region.  He had a spinal anesthesia.  He got through surgery without much difficulty.  His main complaint has been dizziness.  I am not sure exactly what is going on.  He has had no visual changes.  Is had no headache.  He has had no weakness.  He says that the dizziness can happen any time.  We did go ahead and do a PET scan on him.  This was done on February 13.  Surprisingly, he has new bilateral hypermetabolic pulmonary opacities.  It is felt that these are likely metastasis.  However, infectious etiology is not ruled out.  In the right upper lobe there is a 12 mm area.  In the left upper lobe there is a 10 mm area.  His appetite is good.  His voice is coming back a little bit.  He has had no nausea or vomiting.  He has had no diarrhea.  He had no leg swelling.  Currently, his performance status is ECOG 2.    Medications:  Allergies as of 03/20/2017      Reactions   No Known Allergies       Medication List        Accurate as of 03/20/17  2:31 PM. Always use your most recent med list.          aspirin EC 81 MG tablet Take 81 mg by mouth daily.   atorvastatin 40 MG tablet Commonly known as:  LIPITOR TAKE 1 TABLET(40 MG) BY MOUTH DAILY   CARAFATE 1 GM/10ML suspension Generic drug:  sucralfate Take 10 mLs (1 g total) 2 (two) times daily by mouth.   HYDROcodone-acetaminophen 5-325 MG tablet Commonly known as:  NORCO/VICODIN Take 1 tablet by mouth  every 6 (six) hours as needed for severe pain.   ipratropium-albuterol 0.5-2.5 (3) MG/3ML Soln Commonly known as:  DUONEB USE 1 VIAL VIA NEBULIZER EVERY 6 HOURS AS NEEDED FOR SHORTNESS OF BREATH OR WHEEZING   COMBIVENT RESPIMAT 20-100 MCG/ACT Aers respimat Generic drug:  Ipratropium-Albuterol INHALE 1 PUFF BY MOUTH EVERY 6 HOURS AS NEEDED FOR WHEEZING   metoprolol tartrate 25 MG tablet Commonly known as:  LOPRESSOR TAKE 1 TABLET(25 MG) BY MOUTH TWICE DAILY   multivitamin tablet Take 1 tablet by mouth daily. Centrum Silver   omeprazole 20 MG capsule Commonly known as:  PRILOSEC TAKE ONE CAPSULE BY MOUTH EVERY DAY   STIOLTO RESPIMAT 2.5-2.5 MCG/ACT Aers Generic drug:  Tiotropium Bromide-Olodaterol INHALE 2 PUFFS BY MOUTH DAILY       Allergies:  Allergies  Allergen Reactions  . No Known Allergies     Past Medical History, Surgical history, Social history, and Family History were reviewed and updated.  Review of Systems: Review of Systems  HENT: Positive for sore throat.        He has hoarseness.  Eyes: Negative.   Respiratory: Positive for shortness of breath.   Cardiovascular: Negative.  Gastrointestinal: Positive for heartburn.  Genitourinary: Negative.   Musculoskeletal: Positive for neck pain.  Skin: Negative.   Neurological: Positive for weakness.  Endo/Heme/Allergies: Negative.   Psychiatric/Behavioral: Negative.      Physical Exam:  vitals were not taken for this visit.   Wt Readings from Last 3 Encounters:  03/20/17 177 lb (80.3 kg)  03/13/17 164 lb (74.4 kg)  02/25/17 185 lb (83.9 kg)    Physical Exam  Constitutional: He is oriented to person, place, and time.  HENT:  Head: Normocephalic and atraumatic.  Mouth/Throat: Oropharynx is clear and moist.  Eyes: EOM are normal. Pupils are equal, round, and reactive to light.  Neck: Normal range of motion.  Cardiovascular: Normal rate, regular rhythm and normal heart sounds.  Pulmonary/Chest:  Effort normal and breath sounds normal.  Abdominal: Soft. Bowel sounds are normal.  Musculoskeletal: Normal range of motion. He exhibits no edema, tenderness or deformity.  Lymphadenopathy:    He has no cervical adenopathy.  Neurological: He is alert and oriented to person, place, and time.  Skin: Skin is warm and dry. No rash noted. No erythema.  Psychiatric: He has a normal mood and affect. His behavior is normal. Judgment and thought content normal.  Vitals reviewed.   Lab Results  Component Value Date   WBC 7.6 02/27/2017   HGB 12.1 (L) 02/27/2017   HCT 35.9 (L) 02/27/2017   MCV 92.1 02/27/2017   PLT 121 (L) 02/27/2017   Lab Results  Component Value Date   IRON 66 06/02/2012   TIBC 378 06/02/2012   UIBC 312 06/02/2012   IRONPCTSAT 17 (L) 06/02/2012   Lab Results  Component Value Date   RBC 3.90 (L) 02/27/2017   No results found for: KPAFRELGTCHN, LAMBDASER, KAPLAMBRATIO No results found for: IGGSERUM, IGA, IGMSERUM No results found for: Odetta Pink, SPEI   Chemistry      Component Value Date/Time   NA 135 02/27/2017 0320   NA 133 02/06/2017 0757   NA 129 (L) 01/23/2017 1050   K 4.9 02/27/2017 0320   K 3.9 02/06/2017 0757   K 3.4 (L) 01/23/2017 1050   CL 104 02/27/2017 0320   CL 90 (L) 02/06/2017 0757   CO2 27 02/27/2017 0320   CO2 29 02/06/2017 0757   CO2 26 01/23/2017 1050   BUN 24 (H) 02/27/2017 0320   BUN 8 02/06/2017 0757   BUN 12.4 01/23/2017 1050   CREATININE 0.60 (L) 02/27/2017 0320   CREATININE 0.9 02/06/2017 0757   CREATININE 0.8 01/23/2017 1050      Component Value Date/Time   CALCIUM 8.5 (L) 02/27/2017 0320   CALCIUM 9.3 02/06/2017 0757   CALCIUM 8.9 01/23/2017 1050   ALKPHOS 64 02/25/2017 1517   ALKPHOS 79 02/06/2017 0757   ALKPHOS 80 01/23/2017 1050   AST 23 02/25/2017 1517   AST 54 (H) 02/06/2017 0757   AST 39 (H) 01/23/2017 1050   ALT 23 02/25/2017 1517   ALT 43 02/06/2017 0757     ALT 27 01/23/2017 1050   BILITOT 1.2 02/25/2017 1517   BILITOT 0.90 02/06/2017 0757   BILITOT 0.51 01/23/2017 1050      Impression and Plan: Bradley Meadows is a very pleasant 77 yo caucasian gentleman with 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  I am a little concerned about the PET scan.  I would be hard pressed to say that this is truly recurrent disease.  He is already on immunotherapy.  The radiologist recommended a follow-up CT scan in 4 weeks.  I will get one set up for him.  He will get his durvalumab today.  I will try some meclizine for his dizziness.  He will get his next dose of durvalumab at the [facility].  I will see him back in 1 month.  We will do his CT scan when I see him back.  I spent about 30 minutes with he and his daughter.  Over 50% of the time was spent face-to-face with them.  I reviewed the PET scan.  I was talking about my recommendations for the CT scan.  I reviewed the labs.  I answered their questions.   Volanda Napoleon, MD 2/15/20192:31 PM

## 2017-03-20 NOTE — Patient Instructions (Signed)

## 2017-03-27 ENCOUNTER — Other Ambulatory Visit (HOSPITAL_BASED_OUTPATIENT_CLINIC_OR_DEPARTMENT_OTHER): Payer: Medicare Other

## 2017-03-27 ENCOUNTER — Ambulatory Visit (INDEPENDENT_AMBULATORY_CARE_PROVIDER_SITE_OTHER): Payer: Medicare Other | Admitting: Family Medicine

## 2017-03-27 ENCOUNTER — Encounter: Payer: Self-pay | Admitting: Family Medicine

## 2017-03-27 VITALS — BP 110/56 | HR 70 | Temp 98.3°F | Resp 20 | Ht 72.0 in | Wt 180.0 lb

## 2017-03-27 DIAGNOSIS — L723 Sebaceous cyst: Secondary | ICD-10-CM

## 2017-03-27 DIAGNOSIS — I952 Hypotension due to drugs: Secondary | ICD-10-CM

## 2017-03-27 NOTE — Progress Notes (Signed)
Subjective:    Patient ID: Bradley Meadows, male    DOB: February 28, 1940, 77 y.o.   MRN: [id number]  HPI  03/13/17 Recently had emergency surgery for an incarcerated hernia.  Since recovery for his surgery, his blood pressure has been very low.  Today is 92/50.  He reports dizziness upon standing.  He reports feeling lightheaded.  He denies any chest pain shortness of breath or dyspnea on exertion beyond his baseline.  He denies any blood in his stool or melena.  He denies any fevers or chills or signs of any infection.  At that time, my plan was: Patient's blood pressure is low.  There is no evidence of an infection.  He denies any fevers or chills or coughing or dysuria.  He has had no further diarrhea since discharge from the hospital.  Therefore I do not believe his low blood pressures from sepsis.  He denies any blood loss.  Therefore I will discontinue his Maxide.  I will reduce his Lopressor to 12.5 mg twice daily and recheck blood pressure on Monday.  If blood pressure still low Monday, we will discontinue Lopressor altogether and also check CBC as well as a workup for possible underlying infection.  However today at the patient appears to be overmedicated as clinically he appears well and is laughing and smiling and in no apparent distress.  I believe that the blood pressure medication is too much given all the recent stress his body has been under  03/27/17 Patient continues to feel weak and tired despite the fact his blood pressure is better than his last visit.  He also continues to feel dizzy.  Recently his cancer doctor put him on Dramamine for dizziness.  His blood pressure today is better although still low given his age.  He is also concerned by a large painful "boil" on his upper back.  On exam, there is a large red inflamed sebaceous cyst approximately 3 cm in diameter which is extremely tender to the touch and fluctuant.  It is spontaneously draining cyst sac contents through pores on the surface  of the skin.   Past Medical History:  Diagnosis Date  . Arthritis   . Asthma   . CHF (congestive heart failure) (HCC)    ef=45-50%  . COPD (chronic obstructive pulmonary disease) ([place])    home O2  . Coronary artery disease    40-50% mLAD, 40% pRCA, 30% mRCA, occluded CX with retrograde filling 06/01/06 cath  . GERD (gastroesophageal reflux disease)    ulcers  . Goals of care, counseling/discussion 04/07/2016  . History of external beam radiation therapy 04/24/16-06/04/16   chest 60 Gy in 30 fractions  . Hyperlipidemia   . Hypertension   . 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 PVC's (premature ventricular contractions)    2017  . Squamous cell lung cancer, right ([place]) 04/07/2016   Past Surgical History:  Procedure Laterality Date  . ESOPHAGOGASTRODUODENOSCOPY (EGD) WITH PROPOFOL N/A 02/26/2016   Procedure: ESOPHAGOGASTRODUODENOSCOPY (EGD) WITH PROPOFOL;  Surgeon: Irene Shipper, MD;  [facility];  Service: Endoscopy;  Laterality: N/A;  . EXPLORATORY LAPAROTOMY     for ulcer  "washed me out with a hose pipe"  . EYE SURGERY     cataracts  . HERNIA REPAIR    . INGUINAL HERNIA REPAIR Left 09/26/2016   Procedure: HERNIA REPAIR INGUINAL LEFT;  Surgeon: Jackolyn Confer, MD;  [facility];  Service: General;  Laterality: Left;  With MESH  . INGUINAL HERNIA REPAIR Right 02/26/2017   Procedure: HERNIA REPAIR INGUINAL INCARCERATED WITH MESH;  Surgeon: Excell Seltzer, MD;  [facility];  Service: General;  Laterality: Right;  . INSERTION OF MESH Right 02/26/2017   Procedure: INSERTION OF MESH;  Surgeon: Excell Seltzer, MD;  [facility];  Service: General;  Laterality: Right;  . IR GENERIC HISTORICAL  04/10/2016   IR US GUIDE VASC ACCESS RIGHT 04/10/2016 Markus Daft, MD [facility]  . IR GENERIC HISTORICAL  04/10/2016   IR FLUORO GUIDE PORT INSERTION RIGHT 04/10/2016 Markus Daft, MD [facility]  . IR GENERIC HISTORICAL   04/10/2016   IR FLUORO RM 30-60 MIN 04/10/2016 Markus Daft, MD [facility]  . IR GENERIC HISTORICAL  04/18/2016   IR FLUORO RM 30-60 MIN 04/18/2016 Greggory Keen, MD [facility]  . JOINT REPLACEMENT     knee both  . PORTACATH PLACEMENT Right 04/10/2016  . VIDEO BRONCHOSCOPY WITH ENDOBRONCHIAL ULTRASOUND N/A 03/26/2016   Procedure: VIDEO BRONCHOSCOPY WITH ENDOBRONCHIAL ULTRASOUND;  Surgeon: Collene Gobble, MD;  [facility];  Service: Thoracic;  Laterality: N/A;   Current Outpatient Medications on File Prior to Visit  Medication Sig Dispense Refill  . aspirin EC 81 MG tablet Take 81 mg by mouth daily.    [name] atorvastatin (LIPITOR) 40 MG tablet TAKE 1 TABLET(40 MG) BY MOUTH DAILY 90 tablet 0  . CARAFATE 1 GM/10ML suspension Take 10 mLs (1 g total) 2 (two) times daily by mouth. 420 mL 6  . COMBIVENT RESPIMAT 20-100 MCG/ACT AERS respimat INHALE 1 PUFF BY MOUTH EVERY 6 HOURS AS NEEDED FOR WHEEZING 4 g 5  . HYDROcodone-acetaminophen (NORCO/VICODIN) 5-325 MG tablet Take 1 tablet by mouth every 6 (six) hours as needed for severe pain. 20 tablet 0  . ipratropium-albuterol (DUONEB) 0.5-2.5 (3) MG/3ML SOLN USE 1 VIAL VIA NEBULIZER EVERY 6 HOURS AS NEEDED FOR SHORTNESS OF BREATH OR WHEEZING 990 mL 0  . meclizine (ANTIVERT) 12.5 MG tablet Take 1 tablet (12.5 mg total) by mouth 3 (three) times daily as needed for dizziness. 60 tablet 4  . metoprolol tartrate (LOPRESSOR) 25 MG tablet TAKE 1 TABLET(25 MG) BY MOUTH TWICE DAILY (Patient taking differently: TAKE 1/2 TABLELET(12.5 MG) BY MOUTH TWICE DAILY) 180 tablet 1  . Multiple Vitamin (MULTIVITAMIN) tablet Take 1 tablet by mouth daily. Centrum Silver    . omeprazole (PRILOSEC) 20 MG capsule TAKE ONE CAPSULE BY MOUTH EVERY DAY 90 capsule 1  . STIOLTO RESPIMAT 2.5-2.5 MCG/ACT AERS INHALE 2 PUFFS BY MOUTH DAILY 4 g 5   No current facility-administered medications on file prior to visit.    Allergies  Allergen Reactions  . No Known Allergies    Social History     Socioeconomic History  . Marital status: Divorced    Spouse name: Not on file  . Number of children: 3  . Years of education: 9  . Highest education level: Not on file  Social Needs  . Financial resource strain: Not on file  . Food insecurity - worry: Not on file  . Food insecurity - inability: Not on file  . Transportation needs - medical: Not on file  . Transportation needs - non-medical: Not on file  Occupational History  . Occupation: RetiredResearch officer, political party: RETIRED  Tobacco Use  . Smoking status: Former Smoker    Packs/day: 1.50    Years: 30.00    Pack years: 45.00    Types: Cigarettes  Last attempt to quit: 09/29/2015    Years since quitting: 1.4  . Smokeless tobacco: Never Used  Substance and Sexual Activity  . Alcohol use: No  . Drug use: No  . Sexual activity: Not Currently  Other Topics Concern  . Not on file  Social History Narrative   Grew up in [place] area.    Left school after 9th grade. No father and had to work.    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       Health Care POA:    Emergency Contact: sister, Hector Brunswick (h) [phone]   End of Life Plan:    Who lives with you: self   Any pets: Lorrin Goodell   Diet: Pt limits sugars and starches and focuses on vegetables an protein. Pt has lost 50 lbs over several years.   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   Seatbelts: Pt reports wearing seatbelt when in vehicle.   Sun Exposure/Protection: Pt does not use sun protectin.   Hobbies: Racing, TV, gardening, Architect            Review of Systems  All other systems reviewed and are negative.      Objective:   Physical Exam  Cardiovascular: Normal rate, regular rhythm and normal heart sounds.  Pulmonary/Chest: Effort normal and breath sounds normal. No respiratory distress. He has no wheezes. He has no rales.  Abdominal: Soft. Bowel sounds are normal.  He exhibits no distension. There is no tenderness. There is no rebound.  Musculoskeletal:       Thoracic back: He exhibits deformity and pain.       Back:  Vitals reviewed.         Assessment & Plan:  Hypotension due to drugs  Inflamed sebaceous cyst I still feel that low blood pressure could be playing a role in his fatigue.  I believe his fatigue is likely multifactorial but I will have the patient temporarily discontinue metoprolol altogether and then reassess the patient next week to see if he is improving.  I anesthetized the inflamed sebaceous cyst on his upper back was 0.1% lidocaine with epinephrine.  I then made a 1 cm vertical incision in the center of the cyst and expressed cyst sac contents through the opening.  I then probed and cleaned the wound thoroughly with Q-tip soaked with hydrogen peroxide and then packed the cavity with approximately 10 inches of 1/4 inch iodoform gauze.  Recheck here on Tuesday.  Wound care was discussed with the patient's family.

## 2017-03-31 ENCOUNTER — Encounter: Payer: Self-pay | Admitting: Family Medicine

## 2017-03-31 ENCOUNTER — Ambulatory Visit (INDEPENDENT_AMBULATORY_CARE_PROVIDER_SITE_OTHER): Payer: Medicare Other | Admitting: Family Medicine

## 2017-03-31 VITALS — BP 110/56 | HR 100 | Temp 98.2°F | Resp 16 | Ht 72.0 in | Wt 172.0 lb

## 2017-03-31 DIAGNOSIS — K4030 Unilateral inguinal hernia, with obstruction, without gangrene, not specified as recurrent: Secondary | ICD-10-CM

## 2017-03-31 DIAGNOSIS — L03312 Cellulitis of back [any part except buttock]: Secondary | ICD-10-CM

## 2017-03-31 DIAGNOSIS — Z8711 Personal history of peptic ulcer disease: Secondary | ICD-10-CM

## 2017-03-31 DIAGNOSIS — L723 Sebaceous cyst: Secondary | ICD-10-CM

## 2017-03-31 DIAGNOSIS — M7989 Other specified soft tissue disorders: Secondary | ICD-10-CM

## 2017-03-31 MED ORDER — SULFAMETHOXAZOLE-TRIMETHOPRIM 800-160 MG PO TABS: 1.0000 | ORAL_TABLET | Freq: Two times a day (BID) | ORAL | 0 refills | Status: DC

## 2017-03-31 MED ORDER — HYDROCODONE-ACETAMINOPHEN 5-325 MG PO TABS: 1.0000 | ORAL_TABLET | Freq: Four times a day (QID) | ORAL | 0 refills | Status: DC | PRN

## 2017-03-31 MED ORDER — OMEPRAZOLE 20 MG PO CPDR: 20.0000 mg | DELAYED_RELEASE_CAPSULE | Freq: Every day | ORAL | 3 refills | Status: DC

## 2017-03-31 MED ORDER — CARAFATE 1 GM/10ML PO SUSP: 1.0000 g | Freq: Two times a day (BID) | ORAL | 6 refills | Status: DC

## 2017-03-31 NOTE — Progress Notes (Signed)
Subjective:    Patient ID: Bradley Meadows, male    DOB: 14-Dec-1940, 77 y.o.   MRN: [id number]  HPI  03/13/17 Recently had emergency surgery for an incarcerated hernia.  Since recovery for his surgery, his blood pressure has been very low.  Today is 92/50.  Bradley Meadows reports dizziness upon standing.  Bradley Meadows reports feeling lightheaded.  Bradley Meadows denies any chest pain shortness of breath or dyspnea on exertion beyond his baseline.  Bradley Meadows denies any blood in his stool or melena.  Bradley Meadows denies any fevers or chills or signs of any infection.  At that time, my plan was: Patient's blood pressure is low.  There is no evidence of an infection.  Bradley Meadows denies any fevers or chills or coughing or dysuria.  Bradley Meadows has had no further diarrhea since discharge from the hospital.  Therefore I do not believe his low blood pressures from sepsis.  Bradley Meadows denies any blood loss.  Therefore I will discontinue his Maxide.  I will reduce his Lopressor to 12.5 mg twice daily and recheck blood pressure on Monday.  If blood pressure still low Monday, we will discontinue Lopressor altogether and also check CBC as well as a workup for possible underlying infection.  However today at the patient appears to be overmedicated as clinically Bradley Meadows appears well and is laughing and smiling and in no apparent distress.  I believe that the blood pressure medication is too much given all the recent stress his body has been under  03/27/17 Patient continues to feel weak and tired despite the fact his blood pressure is better than his last visit.  Bradley Meadows also continues to feel dizzy.  Recently his cancer doctor put him on Dramamine for dizziness.  His blood pressure today is better although still low given his age.  Bradley Meadows is also concerned by a large painful "boil" on his upper back.  On exam, there is a large red inflamed sebaceous cyst approximately 3 cm in diameter which is extremely tender to the touch and fluctuant.  It is spontaneously draining cyst sac contents through pores on the surface  of the skin.  At that time, my plan was: I still feel that low blood pressure could be playing a role in his fatigue.  I believe his fatigue is likely multifactorial but I will have the patient temporarily discontinue metoprolol altogether and then reassess the patient next week to see if Bradley Meadows is improving.  I anesthetized the inflamed sebaceous cyst on his upper back was 0.1% lidocaine with epinephrine.  I then made a 1 cm vertical incision in the center of the cyst and expressed cyst sac contents through the opening.  I then probed and cleaned the wound thoroughly with Q-tip soaked with hydrogen peroxide and then packed the cavity with approximately 10 inches of 1/4 inch iodoform gauze.  Recheck here on Tuesday.  Wound care was discussed with the patient's family.  03/31/17 Here today for recheck.  All the packing has been removed.  There is now a 1 cm circular opening where the incision and drainage was performed.  The surrounding skin is now erythematous warm to the touch and tender.  There is purulent exudate coming from the wound.  The wound appears to be becoming secondarily infected.   Past Medical History:  Diagnosis Date  . Arthritis   . Asthma   . CHF (congestive heart failure) (HCC)    ef=45-50%  . COPD (chronic obstructive pulmonary disease) ([facility])    home O2  .  Coronary artery disease    40-50% mLAD, 40% pRCA, 30% mRCA, occluded CX with retrograde filling 06/01/06 cath  . GERD (gastroesophageal reflux disease)    ulcers  . Goals of care, counseling/discussion 04/07/2016  . History of external beam radiation therapy 04/24/16-06/04/16   chest 60 Gy in 30 fractions  . Hyperlipidemia   . Hypertension   . 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 PVC's (premature ventricular contractions)    2017  . Squamous cell lung cancer, right ([facility]) 04/07/2016   Past Surgical History:  Procedure Laterality Date  . ESOPHAGOGASTRODUODENOSCOPY (EGD) WITH  PROPOFOL N/A 02/26/2016   Procedure: ESOPHAGOGASTRODUODENOSCOPY (EGD) WITH PROPOFOL;  Surgeon: Irene Shipper, MD;  Location: [facility];  Service: Endoscopy;  Laterality: N/A;  . EXPLORATORY LAPAROTOMY     for ulcer  "washed me out with a hose pipe"  . EYE SURGERY     cataracts  . HERNIA REPAIR    . INGUINAL HERNIA REPAIR Left 09/26/2016   Procedure: HERNIA REPAIR INGUINAL LEFT;  Surgeon: Jackolyn Confer, MD;  Location: [facility];  Service: General;  Laterality: Left;  With MESH  . INGUINAL HERNIA REPAIR Right 02/26/2017   Procedure: HERNIA REPAIR INGUINAL INCARCERATED WITH MESH;  Surgeon: Excell Seltzer, MD;  Location: [facility];  Service: General;  Laterality: Right;  . INSERTION OF MESH Right 02/26/2017   Procedure: INSERTION OF MESH;  Surgeon: Excell Seltzer, MD;  Location: [facility];  Service: General;  Laterality: Right;  . IR GENERIC HISTORICAL  04/10/2016   IR US GUIDE VASC ACCESS RIGHT 04/10/2016 Markus Daft, MD [facility]  . IR GENERIC HISTORICAL  04/10/2016   IR FLUORO GUIDE PORT INSERTION RIGHT 04/10/2016 Markus Daft, MD [facility]  . IR GENERIC HISTORICAL  04/10/2016   IR FLUORO RM 30-60 MIN 04/10/2016 Markus Daft, MD [facility]  . IR GENERIC HISTORICAL  04/18/2016   IR FLUORO RM 30-60 MIN 04/18/2016 Greggory Keen, MD [facility]  . JOINT REPLACEMENT     knee both  . PORTACATH PLACEMENT Right 04/10/2016  . VIDEO BRONCHOSCOPY WITH ENDOBRONCHIAL ULTRASOUND N/A 03/26/2016   Procedure: VIDEO BRONCHOSCOPY WITH ENDOBRONCHIAL ULTRASOUND;  Surgeon: Collene Gobble, MD;  Location: [facility];  Service: Thoracic;  Laterality: N/A;   Current Outpatient Medications on File Prior to Visit  Medication Sig Dispense Refill  . aspirin EC 81 MG tablet Take 81 mg by mouth daily.    [name] atorvastatin (LIPITOR) 40 MG tablet TAKE 1 TABLET(40 MG) BY MOUTH DAILY 90 tablet 0  . COMBIVENT RESPIMAT 20-100 MCG/ACT AERS respimat INHALE 1 PUFF BY MOUTH EVERY 6 HOURS AS NEEDED FOR WHEEZING 4 g 5  . ipratropium-albuterol  (DUONEB) 0.5-2.5 (3) MG/3ML SOLN USE 1 VIAL VIA NEBULIZER EVERY 6 HOURS AS NEEDED FOR SHORTNESS OF BREATH OR WHEEZING 990 mL 0  . meclizine (ANTIVERT) 12.5 MG tablet Take 1 tablet (12.5 mg total) by mouth 3 (three) times daily as needed for dizziness. 60 tablet 4  . metoprolol tartrate (LOPRESSOR) 25 MG tablet TAKE 1 TABLET(25 MG) BY MOUTH TWICE DAILY (Patient taking differently: TAKE 1/2 TABLELET(12.5 MG) BY MOUTH TWICE DAILY) 180 tablet 1  . Multiple Vitamin (MULTIVITAMIN) tablet Take 1 tablet by mouth daily. Centrum Silver    . STIOLTO RESPIMAT 2.5-2.5 MCG/ACT AERS INHALE 2 PUFFS BY MOUTH DAILY 4 g 5   No current facility-administered medications on file prior to visit.    Allergies  Allergen Reactions  . No Known Allergies  Social History   Socioeconomic History  . Marital status: Divorced    Spouse name: Not on file  . Number of children: 3  . Years of education: 9  . Highest education level: Not on file  Social Needs  . Financial resource strain: Not on file  . Food insecurity - worry: Not on file  . Food insecurity - inability: Not on file  . Transportation needs - medical: Not on file  . Transportation needs - non-medical: Not on file  Occupational History  . Occupation: RetiredResearch officer, political party: RETIRED  Tobacco Use  . Smoking status: Former Smoker    Packs/day: 1.50    Years: 30.00    Pack years: 45.00    Types: Cigarettes    Last attempt to quit: 09/29/2015    Years since quitting: 1.5  . Smokeless tobacco: Never Used  Substance and Sexual Activity  . Alcohol use: No  . Drug use: No  . Sexual activity: Not Currently  Other Topics Concern  . Not on file  Social History Narrative   Grew up in [place] area.    Left school after 9th grade. No father and had to work.    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       Health Care POA:    Emergency Contact: sister, Hector Brunswick (h) [phone]   End of Life Plan:    Who lives with you: self   Any  pets: Lorrin Goodell   Diet: Pt limits sugars and starches and focuses on vegetables an protein. Pt has lost 50 lbs over several years.   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   Seatbelts: Pt reports wearing seatbelt when in vehicle.   Sun Exposure/Protection: Pt does not use sun protectin.   Hobbies: Racing, TV, gardening, Architect            Review of Systems  All other systems reviewed and are negative.      Objective:   Physical Exam  Cardiovascular: Normal rate, regular rhythm and normal heart sounds.  Pulmonary/Chest: Effort normal and breath sounds normal. No respiratory distress. Bradley Meadows has no wheezes. Bradley Meadows has no rales.  Abdominal: Soft. Bowel sounds are normal. Bradley Meadows exhibits no distension. There is no tenderness. There is no rebound.  Musculoskeletal:       Thoracic back: Bradley Meadows exhibits deformity and pain.       Back:  Vitals reviewed.         Assessment & Plan:  Inflamed sebaceous cyst  Incarcerated right inguinal hernia - Plan: HYDROcodone-acetaminophen (NORCO/VICODIN) 5-325 MG tablet  Right leg swelling - Plan: CARAFATE 1 GM/10ML suspension  History of peptic ulcer - Plan: omeprazole (PRILOSEC) 20 MG capsule  Cellulitis of back except buttock  Patient appears to have developed a secondary cellulitis at the site of the incision and drainage of his inflamed sebaceous cyst.  I will treat this with Bactrim double strength tablets twice daily for 7 days and then recheck in 1 week if no better or sooner if worse.  Patient requested a refill on his Carafate and his Prilosec.  Bradley Meadows also requested a refill on his pain medication which Bradley Meadows takes twice a day for chronic pain.

## 2017-04-02 ENCOUNTER — Other Ambulatory Visit: Payer: Self-pay | Admitting: *Deleted

## 2017-04-02 ENCOUNTER — Telehealth: Payer: Self-pay | Admitting: Family Medicine

## 2017-04-02 ENCOUNTER — Other Ambulatory Visit: Payer: Self-pay | Admitting: Pharmacist

## 2017-04-02 DIAGNOSIS — C3491 Malignant neoplasm of unspecified part of right bronchus or lung: Secondary | ICD-10-CM

## 2017-04-02 NOTE — Telephone Encounter (Signed)
Pt's sister joan apple called and states that since pt has started the bactrim he has been real shaky and dizzy and wanted to know if this was the antibx and if so can we change it???

## 2017-04-03 ENCOUNTER — Ambulatory Visit: Payer: Medicare Other

## 2017-04-03 ENCOUNTER — Inpatient Hospital Stay: Payer: Medicare Other | Attending: Hematology & Oncology

## 2017-04-03 ENCOUNTER — Other Ambulatory Visit: Payer: Medicare Other

## 2017-04-03 ENCOUNTER — Inpatient Hospital Stay: Payer: Medicare Other

## 2017-04-03 ENCOUNTER — Ambulatory Visit (HOSPITAL_BASED_OUTPATIENT_CLINIC_OR_DEPARTMENT_OTHER)
Admission: RE | Admit: 2017-04-03 | Discharge: 2017-04-03 | Disposition: A | Discharge status: Home / Self Care | Payer: Medicare Other | Source: Ambulatory Visit | Attending: Hematology & Oncology | Admitting: Hematology & Oncology

## 2017-04-03 ENCOUNTER — Other Ambulatory Visit: Payer: Self-pay

## 2017-04-03 VITALS — BP 110/71 | HR 78 | Temp 97.7°F | Resp 18 | Wt 168.2 lb

## 2017-04-03 DIAGNOSIS — R05 Cough: Secondary | ICD-10-CM | POA: Diagnosis not present

## 2017-04-03 DIAGNOSIS — R0602 Shortness of breath: Secondary | ICD-10-CM | POA: Insufficient documentation

## 2017-04-03 DIAGNOSIS — R531 Weakness: Secondary | ICD-10-CM | POA: Insufficient documentation

## 2017-04-03 DIAGNOSIS — R42 Dizziness and giddiness: Secondary | ICD-10-CM | POA: Insufficient documentation

## 2017-04-03 DIAGNOSIS — E860 Dehydration: Secondary | ICD-10-CM | POA: Insufficient documentation

## 2017-04-03 DIAGNOSIS — Z5112 Encounter for antineoplastic immunotherapy: Secondary | ICD-10-CM | POA: Insufficient documentation

## 2017-04-03 DIAGNOSIS — Z9181 History of falling: Secondary | ICD-10-CM | POA: Diagnosis not present

## 2017-04-03 DIAGNOSIS — C3491 Malignant neoplasm of unspecified part of right bronchus or lung: Secondary | ICD-10-CM | POA: Diagnosis not present

## 2017-04-03 DIAGNOSIS — I6523 Occlusion and stenosis of bilateral carotid arteries: Secondary | ICD-10-CM | POA: Insufficient documentation

## 2017-04-03 DIAGNOSIS — Z95828 Presence of other vascular implants and grafts: Secondary | ICD-10-CM

## 2017-04-03 DIAGNOSIS — Z923 Personal history of irradiation: Secondary | ICD-10-CM | POA: Diagnosis not present

## 2017-04-03 DIAGNOSIS — Z9221 Personal history of antineoplastic chemotherapy: Secondary | ICD-10-CM | POA: Insufficient documentation

## 2017-04-03 LAB — CBC WITH DIFFERENTIAL (CANCER CENTER ONLY)
BASOS PCT: 1 %
Basophils Absolute: 0.1 10*3/uL (ref 0.0–0.1)
Eosinophils Absolute: 0.1 10*3/uL (ref 0.0–0.5)
Eosinophils Relative: 2 %
HEMATOCRIT: 33.3 % — AB (ref 38.7–49.9)
Hemoglobin: 11.1 g/dL — ABNORMAL LOW (ref 13.0–17.1)
Lymphocytes Relative: 14 %
Lymphs Abs: 0.9 10*3/uL (ref 0.9–3.3)
MCH: 31.7 pg (ref 28.0–33.4)
MCHC: 33.3 g/dL (ref 32.0–35.9)
MCV: 95.1 fL (ref 82.0–98.0)
MONO ABS: 0.5 10*3/uL (ref 0.1–0.9)
MONOS PCT: 9 %
NEUTROS ABS: 4.6 10*3/uL (ref 1.5–6.5)
Neutrophils Relative %: 74 %
Platelet Count: 197 10*3/uL (ref 145–400)
RBC: 3.5 MIL/uL — ABNORMAL LOW (ref 4.20–5.70)
RDW: 15.2 % (ref 11.1–15.7)
WBC Count: 6.2 10*3/uL (ref 4.0–10.0)

## 2017-04-03 LAB — CMP (CANCER CENTER ONLY)
ALBUMIN: 3.3 g/dL — AB (ref 3.5–5.0)
ALT: 13 U/L (ref 10–47)
AST: 20 U/L (ref 11–38)
Alkaline Phosphatase: 78 U/L (ref 26–84)
Anion gap: 7 (ref 5–15)
BUN: 8 mg/dL (ref 7–22)
CALCIUM: 9.3 mg/dL (ref 8.0–10.3)
CO2: 29 mmol/L (ref 18–33)
Chloride: 100 mmol/L (ref 98–108)
Creatinine: 0.8 mg/dL (ref 0.60–1.20)
GLUCOSE: 100 mg/dL (ref 73–118)
Potassium: 3.7 mmol/L (ref 3.3–4.7)
SODIUM: 136 mmol/L (ref 128–145)
Total Bilirubin: 0.6 mg/dL (ref 0.2–1.6)
Total Protein: 5.8 g/dL — ABNORMAL LOW (ref 6.4–8.1)

## 2017-04-03 LAB — TSH: TSH: 1.44 u[IU]/mL (ref 0.320–4.118)

## 2017-04-03 MED ORDER — HEPARIN SOD (PORK) LOCK FLUSH 100 UNIT/ML IV SOLN
500.0000 [IU] | Freq: Once | INTRAVENOUS | Status: AC | PRN
  Administered 2017-04-03: 500 [IU]
  Filled 2017-04-03: qty 5

## 2017-04-03 MED ORDER — SODIUM CHLORIDE 0.9% FLUSH
10.0000 mL | INTRAVENOUS | Status: DC | PRN
  Administered 2017-04-03: 10 mL via INTRAVENOUS
  Filled 2017-04-03: qty 10

## 2017-04-03 MED ORDER — SODIUM CHLORIDE 0.9% FLUSH
10.0000 mL | INTRAVENOUS | Status: DC | PRN
  Administered 2017-04-03: 10 mL
  Filled 2017-04-03: qty 10

## 2017-04-03 MED ORDER — SODIUM CHLORIDE 0.9 % IV SOLN
Freq: Once | INTRAVENOUS | Status: AC
  Administered 2017-04-03: 12:00:00 via INTRAVENOUS

## 2017-04-03 MED ORDER — SODIUM CHLORIDE 0.9 % IV SOLN
860.0000 mg | Freq: Once | INTRAVENOUS | Status: AC
  Administered 2017-04-03: 860 mg via INTRAVENOUS
  Filled 2017-04-03: qty 10

## 2017-04-03 NOTE — Telephone Encounter (Signed)
Called Remo Lipps Apple back and she had called the drug store last night and they told her the same as Dr. Dennard Schaumann recommendations and that it could be his hydrocodone. She stated that he will only take that if in severe pain and see if the dizziness and shakiness continues.

## 2017-04-03 NOTE — Telephone Encounter (Signed)
He was dizzy before and the abx should not cause shakiness.  So I do not think the abx is causing this.

## 2017-04-03 NOTE — Patient Instructions (Signed)
Implanted Port Home Guide An implanted port is a type of central line that is placed under the skin. Central lines are used to provide IV access when treatment or nutrition needs to be given through a person's veins. Implanted ports are used for long-term IV access. An implanted port may be placed because:  You need IV medicine that would be irritating to the small veins in your hands or arms.  You need long-term IV medicines, such as antibiotics.  You need IV nutrition for a long period.  You need frequent blood draws for lab tests.  You need dialysis.  Implanted ports are usually placed in the chest area, but they can also be placed in the upper arm, the abdomen, or the leg. An implanted port has two main parts:  Reservoir. The reservoir is round and will appear as a small, raised area under your skin. The reservoir is the part where a needle is inserted to give medicines or draw blood.  Catheter. The catheter is a thin, flexible tube that extends from the reservoir. The catheter is placed into a large vein. Medicine that is inserted into the reservoir goes into the catheter and then into the vein.  How will I care for my incision site? Do not get the incision site wet. Bathe or shower as directed by your health care provider. How is my port accessed? Special steps must be taken to access the port:  Before the port is accessed, a numbing cream can be placed on the skin. This helps numb the skin over the port site.  Your health care provider uses a sterile technique to access the port. ? Your health care provider must put on a mask and sterile gloves. ? The skin over your port is cleaned carefully with an antiseptic and allowed to dry. ? The port is gently pinched between sterile gloves, and a needle is inserted into the port.  Only "non-coring" port needles should be used to access the port. Once the port is accessed, a blood return should be checked. This helps ensure that the port  is in the vein and is not clogged.  If your port needs to remain accessed for a constant infusion, a clear (transparent) bandage will be placed over the needle site. The bandage and needle will need to be changed every week, or as directed by your health care provider.  Keep the bandage covering the needle clean and dry. Do not get it wet. Follow your health care provider's instructions on how to take a shower or bath while the port is accessed.  If your port does not need to stay accessed, no bandage is needed over the port.  What is flushing? Flushing helps keep the port from getting clogged. Follow your health care provider's instructions on how and when to flush the port. Ports are usually flushed with saline solution or a medicine called heparin. The need for flushing will depend on how the port is used.  If the port is used for intermittent medicines or blood draws, the port will need to be flushed: ? After medicines have been given. ? After blood has been drawn. ? As part of routine maintenance.  If a constant infusion is running, the port may not need to be flushed.  How long will my port stay implanted? The port can stay in for as long as your health care provider thinks it is needed. When it is time for the port to come out, surgery will be   done to remove it. The procedure is similar to the one performed when the port was put in. When should I seek immediate medical care? When you have an implanted port, you should seek immediate medical care if:  You notice a bad smell coming from the incision site.  You have swelling, redness, or drainage at the incision site.  You have more swelling or pain at the port site or the surrounding area.  You have a fever that is not controlled with medicine.  This information is not intended to replace advice given to you by your health care provider. Make sure you discuss any questions you have with your health care provider. Document  Released: 01/20/2005 Document Revised: 06/28/2015 Document Reviewed: 09/27/2012 Elsevier Interactive Patient Education  2017 Elsevier Inc.  

## 2017-04-03 NOTE — Patient Instructions (Signed)
[facility] Discharge Instructions for Patients Receiving Chemotherapy  Today you received the following chemotherapy agents Imfinzi To help prevent nausea and vomiting after your treatment, we encourage you to take your nausea medication as prescribed.   If you develop nausea and vomiting that is not controlled by your nausea medication, call the clinic.   BELOW ARE SYMPTOMS THAT SHOULD BE REPORTED IMMEDIATELY:  *FEVER GREATER THAN 100.5 F  *CHILLS WITH OR WITHOUT FEVER  NAUSEA AND VOMITING THAT IS NOT CONTROLLED WITH YOUR NAUSEA MEDICATION  *UNUSUAL SHORTNESS OF BREATH  *UNUSUAL BRUISING OR BLEEDING  TENDERNESS IN MOUTH AND THROAT WITH OR WITHOUT PRESENCE OF ULCERS  *URINARY PROBLEMS  *BOWEL PROBLEMS  UNUSUAL RASH Items with * indicate a potential emergency and should be followed up as soon as possible.  Feel free to call the clinic should you have any questions or concerns. The clinic phone number is [phone].  Please show the [facility] at check-in to the Emergency Department and triage nurse.

## 2017-04-14 ENCOUNTER — Other Ambulatory Visit: Payer: Self-pay | Admitting: Family Medicine

## 2017-04-17 ENCOUNTER — Inpatient Hospital Stay: Payer: Medicare Other

## 2017-04-17 ENCOUNTER — Encounter: Payer: Self-pay | Admitting: Hematology & Oncology

## 2017-04-17 ENCOUNTER — Ambulatory Visit (HOSPITAL_BASED_OUTPATIENT_CLINIC_OR_DEPARTMENT_OTHER)
Admission: RE | Admit: 2017-04-17 | Discharge: 2017-04-17 | Disposition: A | Discharge status: Home / Self Care | Payer: Medicare Other | Source: Ambulatory Visit | Attending: Hematology & Oncology | Admitting: Hematology & Oncology

## 2017-04-17 ENCOUNTER — Other Ambulatory Visit: Payer: Medicare Other

## 2017-04-17 ENCOUNTER — Inpatient Hospital Stay (HOSPITAL_BASED_OUTPATIENT_CLINIC_OR_DEPARTMENT_OTHER): Payer: Medicare Other | Admitting: Hematology & Oncology

## 2017-04-17 ENCOUNTER — Other Ambulatory Visit: Payer: Self-pay

## 2017-04-17 ENCOUNTER — Ambulatory Visit: Payer: Medicare Other

## 2017-04-17 VITALS — BP 112/42 | HR 90 | Temp 98.3°F | Resp 20 | Wt 165.0 lb

## 2017-04-17 DIAGNOSIS — Z9221 Personal history of antineoplastic chemotherapy: Secondary | ICD-10-CM | POA: Diagnosis not present

## 2017-04-17 DIAGNOSIS — C3491 Malignant neoplasm of unspecified part of right bronchus or lung: Secondary | ICD-10-CM

## 2017-04-17 DIAGNOSIS — C3490 Malignant neoplasm of unspecified part of unspecified bronchus or lung: Secondary | ICD-10-CM | POA: Diagnosis not present

## 2017-04-17 DIAGNOSIS — I700 Atherosclerosis of aorta: Secondary | ICD-10-CM | POA: Insufficient documentation

## 2017-04-17 DIAGNOSIS — M4854XA Collapsed vertebra, not elsewhere classified, thoracic region, initial encounter for fracture: Secondary | ICD-10-CM | POA: Insufficient documentation

## 2017-04-17 DIAGNOSIS — R0602 Shortness of breath: Secondary | ICD-10-CM

## 2017-04-17 DIAGNOSIS — Z5112 Encounter for antineoplastic immunotherapy: Secondary | ICD-10-CM | POA: Diagnosis not present

## 2017-04-17 DIAGNOSIS — R531 Weakness: Secondary | ICD-10-CM

## 2017-04-17 DIAGNOSIS — Z923 Personal history of irradiation: Secondary | ICD-10-CM | POA: Diagnosis not present

## 2017-04-17 DIAGNOSIS — R070 Pain in throat: Secondary | ICD-10-CM | POA: Diagnosis not present

## 2017-04-17 DIAGNOSIS — M542 Cervicalgia: Secondary | ICD-10-CM

## 2017-04-17 LAB — CBC WITH DIFFERENTIAL (CANCER CENTER ONLY)
BASOS PCT: 1 %
Basophils Absolute: 0.1 10*3/uL (ref 0.0–0.1)
EOS ABS: 0.3 10*3/uL (ref 0.0–0.5)
Eosinophils Relative: 5 %
HCT: 35.1 % — ABNORMAL LOW (ref 38.7–49.9)
HEMOGLOBIN: 11.6 g/dL — AB (ref 13.0–17.1)
Lymphocytes Relative: 18 %
Lymphs Abs: 1.1 10*3/uL (ref 0.9–3.3)
MCH: 31.8 pg (ref 28.0–33.4)
MCHC: 33 g/dL (ref 32.0–35.9)
MCV: 96.2 fL (ref 82.0–98.0)
MONOS PCT: 7 %
Monocytes Absolute: 0.4 10*3/uL (ref 0.1–0.9)
NEUTROS PCT: 69 %
Neutro Abs: 4.1 10*3/uL (ref 1.5–6.5)
PLATELETS: 167 10*3/uL (ref 145–400)
RBC: 3.65 MIL/uL — ABNORMAL LOW (ref 4.20–5.70)
RDW: 14.9 % (ref 11.1–15.7)
WBC Count: 5.9 10*3/uL (ref 4.0–10.0)

## 2017-04-17 LAB — CMP (CANCER CENTER ONLY)
ALBUMIN: 3.3 g/dL — AB (ref 3.5–5.0)
ALK PHOS: 78 U/L (ref 26–84)
ALT: 14 U/L (ref 10–47)
AST: 18 U/L (ref 11–38)
Anion gap: 5 (ref 5–15)
BUN: 9 mg/dL (ref 7–22)
CALCIUM: 9.1 mg/dL (ref 8.0–10.3)
CO2: 30 mmol/L (ref 18–33)
CREATININE: 0.7 mg/dL (ref 0.60–1.20)
Chloride: 103 mmol/L (ref 98–108)
Glucose, Bld: 161 mg/dL — ABNORMAL HIGH (ref 73–118)
Potassium: 3.5 mmol/L (ref 3.3–4.7)
Sodium: 138 mmol/L (ref 128–145)
Total Bilirubin: 0.7 mg/dL (ref 0.2–1.6)
Total Protein: 5.8 g/dL — ABNORMAL LOW (ref 6.4–8.1)

## 2017-04-17 MED ORDER — SODIUM CHLORIDE 0.9% FLUSH
10.0000 mL | INTRAVENOUS | Status: DC | PRN
  Administered 2017-04-17: 10 mL
  Filled 2017-04-17: qty 10

## 2017-04-17 MED ORDER — HEPARIN SOD (PORK) LOCK FLUSH 100 UNIT/ML IV SOLN
500.0000 [IU] | Freq: Once | INTRAVENOUS | Status: AC | PRN
  Administered 2017-04-17: 500 [IU]
  Filled 2017-04-17: qty 5

## 2017-04-17 MED ORDER — SODIUM CHLORIDE 0.9 % IV SOLN
740.0000 mg | Freq: Once | INTRAVENOUS | Status: AC
  Administered 2017-04-17: 740 mg via INTRAVENOUS
  Filled 2017-04-17: qty 10

## 2017-04-17 MED ORDER — SODIUM CHLORIDE 0.9 % IV SOLN
Freq: Once | INTRAVENOUS | Status: AC
  Administered 2017-04-17: 13:00:00 via INTRAVENOUS

## 2017-04-17 MED ORDER — IOPAMIDOL (ISOVUE-300) INJECTION 61%
100.0000 mL | Freq: Once | INTRAVENOUS | Status: AC | PRN
  Administered 2017-04-17: 100 mL via INTRAVENOUS

## 2017-04-17 NOTE — Progress Notes (Signed)
Hematology and Oncology Follow Up Visit  Bradley Meadows [id number] 1940/11/28 77 y.o. 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 Born is here today for follow-up.  He is doing quite well.  We did go ahead and get a CT scan of his chest today.  Thankfully, the CT scan showed decrease in his pulmonary nodules.  As such, it was felt that we are probably looking at some type of infectious process.  However, the radiologist could not totally rule out a response to treatment.  His hoarseness is getting better.  He does not wish to see the otolaryngologist.  His appetite is doing well.  He is recovered quite nicely from his emergency hernia surgery that he had back in January.  He is tolerating the durvalumab very well.  He has had no rashes.  He has had no nausea or vomiting.  He has had no thyroid issues.  He has had no diarrhea.  Overall, his performance status is ECOG 2    Reaction is more from the Medications:  Allergies as of 04/17/2017      Reactions   No Known Allergies       Medication List        Accurate as of 04/17/17  1:55 PM. Always use your most recent med list.          aspirin EC 81 MG tablet Take 81 mg by mouth daily.   atorvastatin 40 MG tablet Commonly known as:  LIPITOR TAKE 1 TABLET(40 MG) BY MOUTH DAILY   CARAFATE 1 GM/10ML suspension Generic drug:  sucralfate Take 10 mLs (1 g total) by mouth 2 (two) times daily.   HYDROcodone-acetaminophen 5-325 MG tablet Commonly known as:  NORCO/VICODIN Take 1 tablet by mouth every 6 (six) hours as needed for severe pain.   ipratropium-albuterol 0.5-2.5 (3) MG/3ML Soln Commonly known as:  DUONEB USE 1 VIAL VIA NEBULIZER EVERY 6 HOURS AS NEEDED FOR  SHORTNESS OF BREATH OR WHEEZING   COMBIVENT RESPIMAT 20-100 MCG/ACT Aers respimat Generic drug:  Ipratropium-Albuterol INHALE 1 PUFF BY MOUTH EVERY 6 HOURS AS NEEDED FOR WHEEZING   meclizine 12.5 MG tablet Commonly known as:  ANTIVERT Take 1 tablet (12.5 mg total) by mouth 3 (three) times daily as needed for dizziness.   metoprolol tartrate 25 MG tablet Commonly known as:  LOPRESSOR TAKE 1 TABLET(25 MG) BY MOUTH TWICE DAILY   multivitamin tablet Take 1 tablet by mouth daily. Centrum Silver   omeprazole 20 MG capsule Commonly known as:  PRILOSEC Take 1 capsule (20 mg total) by mouth daily.   STIOLTO RESPIMAT 2.5-2.5 MCG/ACT Aers Generic drug:  Tiotropium Bromide-Olodaterol INHALE 2 PUFFS BY MOUTH DAILY   sulfamethoxazole-trimethoprim 800-160 MG tablet Commonly known as:  BACTRIM DS,SEPTRA DS Take 1 tablet by mouth 2 (two) times daily.       Allergies:  Allergies  Allergen Reactions  . No Known Allergies     Past Medical History, Surgical history, Social history, and Family History were reviewed and updated.  Review of Systems: Review of Systems  HENT: Positive for sore throat.        He has hoarseness.  Eyes: Negative.   Respiratory: Positive  for shortness of breath.   Cardiovascular: Negative.   Gastrointestinal: Positive for heartburn.  Genitourinary: Negative.   Musculoskeletal: Positive for neck pain.  Skin: Negative.   Neurological: Positive for weakness.  Endo/Heme/Allergies: Negative.   Psychiatric/Behavioral: Negative.      Physical Exam:  weight is 165 lb (74.8 kg). His oral temperature is 98.3 F (36.8 C). His blood pressure is 112/42 (abnormal) and his pulse is 90. His respiration is 20 and oxygen saturation is 100%.   Wt Readings from Last 3 Encounters:  04/17/17 165 lb (74.8 kg)  04/03/17 168 lb 4 oz (76.3 kg)  03/31/17 172 lb (78 kg)    Physical Exam  Constitutional: He is oriented to person, place, and time.  HENT:  Head:  Normocephalic and atraumatic.  Mouth/Throat: Oropharynx is clear and moist.  Eyes: EOM are normal. Pupils are equal, round, and reactive to light.  Neck: Normal range of motion.  Cardiovascular: Normal rate, regular rhythm and normal heart sounds.  Pulmonary/Chest: Effort normal and breath sounds normal.  Abdominal: Soft. Bowel sounds are normal.  Musculoskeletal: Normal range of motion. He exhibits no edema, tenderness or deformity.  Lymphadenopathy:    He has no cervical adenopathy.  Neurological: He is alert and oriented to person, place, and time.  Skin: Skin is warm and dry. No rash noted. No erythema.  Psychiatric: He has a normal mood and affect. His behavior is normal. Judgment and thought content normal.  Vitals reviewed.   Lab Results  Component Value Date   WBC 5.9 04/17/2017   HGB 12.1 (L) 02/27/2017   HCT 35.1 (L) 04/17/2017   MCV 96.2 04/17/2017   PLT 167 04/17/2017   Lab Results  Component Value Date   IRON 66 06/02/2012   TIBC 378 06/02/2012   UIBC 312 06/02/2012   IRONPCTSAT 17 (L) 06/02/2012   Lab Results  Component Value Date   RBC 3.65 (L) 04/17/2017   No results found for: KPAFRELGTCHN, LAMBDASER, KAPLAMBRATIO No results found for: IGGSERUM, IGA, IGMSERUM No results found for: Odetta Pink, SPEI   Chemistry      Component Value Date/Time   NA 138 04/17/2017 1210   NA 133 02/06/2017 0757   NA 129 (L) 01/23/2017 1050   K 3.5 04/17/2017 1210   K 3.9 02/06/2017 0757   K 3.4 (L) 01/23/2017 1050   CL 103 04/17/2017 1210   CL 90 (L) 02/06/2017 0757   CO2 30 04/17/2017 1210   CO2 29 02/06/2017 0757   CO2 26 01/23/2017 1050   BUN 9 04/17/2017 1210   BUN 8 02/06/2017 0757   BUN 12.4 01/23/2017 1050   CREATININE 0.70 04/17/2017 1210   CREATININE 0.9 02/06/2017 0757   CREATININE 0.8 01/23/2017 1050      Component Value Date/Time   CALCIUM 9.1 04/17/2017 1210   CALCIUM 9.3 02/06/2017 0757    CALCIUM 8.9 01/23/2017 1050   ALKPHOS 78 04/17/2017 1210   ALKPHOS 79 02/06/2017 0757   ALKPHOS 80 01/23/2017 1050   AST 18 04/17/2017 1210   AST 39 (H) 01/23/2017 1050   ALT 14 04/17/2017 1210   ALT 43 02/06/2017 0757   ALT 27 01/23/2017 1050   BILITOT 0.7 04/17/2017 1210   BILITOT 0.51 01/23/2017 1050      Impression and Plan: Mr. Shreve is a very pleasant 77 yo white male with locally advanced squamous cell carcinoma of the lung.  He received initial chemotherapy and radiation therapy.  He  now is on maintenance therapy with durvalumab.  We will go ahead with his treatment today.  Given the CT scan results today, I do not think we have to repeat a PET scan until May or June.  His daughter came with him today.  I reviewed the scans with him.  I spent over 30 minutes with Mr. Kirsch and his daughter.  Over 50% of the time was spent face-to-face.  Volanda Napoleon, MD 3/15/20191:55 PM

## 2017-04-17 NOTE — Patient Instructions (Signed)
Durvalumab injection  What is this medicine?  DURVALUMAB (dur VAL ue mab) is a monoclonal antibody. It is used to treat urothelial cancer.  This medicine may be used for other purposes; ask your health care provider or pharmacist if you have questions.  COMMON BRAND NAME(S): IMFINZI  What should I tell my health care provider before I take this medicine?  They need to know if you have any of these conditions:  -diabetes  -immune system problems  -infection  -inflammatory bowel disease  -kidney disease  -liver disease  -lung or breathing disease  -lupus  -organ transplant  -stomach or intestine problems  -thyroid disease  -an unusual or allergic reaction to durvalumab, other medicines, foods, dyes, or preservatives  -pregnant or trying to get pregnant  -breast-feeding  How should I use this medicine?  This medicine is for infusion into a vein. It is given by a health care professional in a hospital or clinic setting.  A special MedGuide will be given to you before each treatment. Be sure to read this information carefully each time.  Talk to your pediatrician regarding the use of this medicine in children. Special care may be needed.  Overdosage: If you think you have taken too much of this medicine contact a poison control center or emergency room at once.  NOTE: This medicine is only for you. Do not share this medicine with others.  What if I miss a dose?  It is important not to miss your dose. Call your doctor or health care professional if you are unable to keep an appointment.  What may interact with this medicine?  Interactions have not been studied.  This list may not describe all possible interactions. Give your health care provider a list of all the medicines, herbs, non-prescription drugs, or dietary supplements you use. Also tell them if you smoke, drink alcohol, or use illegal drugs. Some items may interact with your medicine.  What should I watch for while using this medicine?  This drug may make you  feel generally unwell. Continue your course of treatment even though you feel ill unless your doctor tells you to stop.  You may need blood work done while you are taking this medicine.  Do not become pregnant while taking this medicine or for 3 months after stopping it. Women should inform their doctor if they wish to become pregnant or think they might be pregnant. There is a potential for serious side effects to an unborn child. Talk to your health care professional or pharmacist for more information. Do not breast-feed an infant while taking this medicine or for 3 months after stopping it.  What side effects may I notice from receiving this medicine?  Side effects that you should report to your doctor or health care professional as soon as possible:  -allergic reactions like skin rash, itching or hives, swelling of the face, lips, or tongue  -black, tarry stools  -bloody or watery diarrhea  -breathing problems  -change in emotions or moods  -change in sex drive  -changes in vision  -chest pain or chest tightness  -chills  -confusion  -cough  -facial flushing  -fever  -headache  -signs and symptoms of high blood sugar such as dizziness; dry mouth; dry skin; fruity breath; nausea; stomach pain; increased hunger or thirst; increased urination  -signs and symptoms of liver injury like dark yellow or brown urine; general ill feeling or flu-like symptoms; light-colored stools; loss of appetite; nausea; right upper belly pain;   unusually weak or tired; yellowing of the eyes or skin  -stomach pain  -trouble passing urine or change in the amount of urine  -weight gain or weight loss  Side effects that usually do not require medical attention (report these to your doctor or health care professional if they continue or are bothersome):  -bone pain  -constipation  -loss of appetite  -muscle pain  -nausea  -swelling of the ankles, feet, hands  -tiredness  This list may not describe all possible side effects. Call your doctor  for medical advice about side effects. You may report side effects to FDA at 1-800-FDA-1088.  Where should I keep my medicine?  This drug is given in a hospital or clinic and will not be stored at home.  NOTE: This sheet is a summary. It may not cover all possible information. If you have questions about this medicine, talk to your doctor, pharmacist, or health care provider.  © 2018 [employer] (2015-08-24 15:50:36)

## 2017-05-01 ENCOUNTER — Inpatient Hospital Stay: Payer: Medicare Other

## 2017-05-01 ENCOUNTER — Other Ambulatory Visit: Payer: Medicare Other

## 2017-05-01 ENCOUNTER — Ambulatory Visit: Payer: Medicare Other | Admitting: Hematology & Oncology

## 2017-05-01 ENCOUNTER — Inpatient Hospital Stay (HOSPITAL_BASED_OUTPATIENT_CLINIC_OR_DEPARTMENT_OTHER): Payer: Medicare Other | Admitting: Family

## 2017-05-01 ENCOUNTER — Ambulatory Visit: Payer: Medicare Other

## 2017-05-01 VITALS — BP 122/59 | HR 71 | Temp 97.1°F | Resp 18 | Wt 169.0 lb

## 2017-05-01 DIAGNOSIS — C3491 Malignant neoplasm of unspecified part of right bronchus or lung: Secondary | ICD-10-CM | POA: Diagnosis not present

## 2017-05-01 DIAGNOSIS — R0602 Shortness of breath: Secondary | ICD-10-CM | POA: Diagnosis not present

## 2017-05-01 DIAGNOSIS — Z923 Personal history of irradiation: Secondary | ICD-10-CM | POA: Diagnosis not present

## 2017-05-01 DIAGNOSIS — E860 Dehydration: Secondary | ICD-10-CM

## 2017-05-01 DIAGNOSIS — R531 Weakness: Secondary | ICD-10-CM | POA: Diagnosis not present

## 2017-05-01 DIAGNOSIS — Z5112 Encounter for antineoplastic immunotherapy: Secondary | ICD-10-CM | POA: Diagnosis not present

## 2017-05-01 DIAGNOSIS — Z9221 Personal history of antineoplastic chemotherapy: Secondary | ICD-10-CM | POA: Diagnosis not present

## 2017-05-01 LAB — CBC WITH DIFFERENTIAL (CANCER CENTER ONLY)
BASOS ABS: 0.1 10*3/uL (ref 0.0–0.1)
BASOS PCT: 1 %
EOS ABS: 0.4 10*3/uL (ref 0.0–0.5)
Eosinophils Relative: 7 %
HCT: 34.4 % — ABNORMAL LOW (ref 38.7–49.9)
HEMOGLOBIN: 11.4 g/dL — AB (ref 13.0–17.1)
LYMPHS ABS: 1 10*3/uL (ref 0.9–3.3)
Lymphocytes Relative: 18 %
MCH: 31.8 pg (ref 28.0–33.4)
MCHC: 33.1 g/dL (ref 32.0–35.9)
MCV: 96.1 fL (ref 82.0–98.0)
Monocytes Absolute: 0.6 10*3/uL (ref 0.1–0.9)
Monocytes Relative: 10 %
NEUTROS PCT: 64 %
Neutro Abs: 3.8 10*3/uL (ref 1.5–6.5)
PLATELETS: 160 10*3/uL (ref 145–400)
RBC: 3.58 MIL/uL — AB (ref 4.20–5.70)
RDW: 14.3 % (ref 11.1–15.7)
WBC: 5.8 10*3/uL (ref 4.0–10.0)

## 2017-05-01 LAB — CMP (CANCER CENTER ONLY)
ALBUMIN: 3.5 g/dL (ref 3.5–5.0)
ALK PHOS: 76 U/L (ref 26–84)
ALT: 18 U/L (ref 10–47)
AST: 18 U/L (ref 11–38)
Anion gap: 9 (ref 5–15)
BILIRUBIN TOTAL: 0.7 mg/dL (ref 0.2–1.6)
BUN: 12 mg/dL (ref 7–22)
CHLORIDE: 95 mmol/L — AB (ref 98–108)
CO2: 32 mmol/L (ref 18–33)
CREATININE: 0.4 mg/dL — AB (ref 0.60–1.20)
Calcium: 9.2 mg/dL (ref 8.0–10.3)
Glucose, Bld: 110 mg/dL (ref 73–118)
Potassium: 3.9 mmol/L (ref 3.3–4.7)
Sodium: 136 mmol/L (ref 128–145)
TOTAL PROTEIN: 5.9 g/dL — AB (ref 6.4–8.1)

## 2017-05-01 LAB — LACTATE DEHYDROGENASE: LDH: 176 U/L (ref 125–245)

## 2017-05-01 MED ORDER — SODIUM CHLORIDE 0.9% FLUSH
10.0000 mL | INTRAVENOUS | Status: DC | PRN
  Administered 2017-05-01: 10 mL
  Filled 2017-05-01: qty 10

## 2017-05-01 MED ORDER — SODIUM CHLORIDE 0.9 % IV SOLN: Freq: Once | INTRAVENOUS | Status: AC

## 2017-05-01 MED ORDER — HEPARIN SOD (PORK) LOCK FLUSH 100 UNIT/ML IV SOLN
500.0000 [IU] | Freq: Once | INTRAVENOUS | Status: AC | PRN
  Administered 2017-05-01: 500 [IU]
  Filled 2017-05-01: qty 5

## 2017-05-01 MED ORDER — SODIUM CHLORIDE 0.9 % IV SOLN
Freq: Once | INTRAVENOUS | Status: AC
  Administered 2017-05-01: 12:00:00 via INTRAVENOUS

## 2017-05-01 MED ORDER — SODIUM CHLORIDE 0.9 % IV SOLN
740.0000 mg | Freq: Once | INTRAVENOUS | Status: AC
  Administered 2017-05-01: 740 mg via INTRAVENOUS
  Filled 2017-05-01: qty 10

## 2017-05-01 NOTE — Patient Instructions (Signed)
Durvalumab injection  What is this medicine?  DURVALUMAB (dur VAL ue mab) is a monoclonal antibody. It is used to treat urothelial cancer.  This medicine may be used for other purposes; ask your health care provider or pharmacist if you have questions.  COMMON BRAND NAME(S): IMFINZI  What should I tell my health care provider before I take this medicine?  They need to know if you have any of these conditions:  -diabetes  -immune system problems  -infection  -inflammatory bowel disease  -kidney disease  -liver disease  -lung or breathing disease  -lupus  -organ transplant  -stomach or intestine problems  -thyroid disease  -an unusual or allergic reaction to durvalumab, other medicines, foods, dyes, or preservatives  -pregnant or trying to get pregnant  -breast-feeding  How should I use this medicine?  This medicine is for infusion into a vein. It is given by a health care professional in a hospital or clinic setting.  A special MedGuide will be given to you before each treatment. Be sure to read this information carefully each time.  Talk to your pediatrician regarding the use of this medicine in children. Special care may be needed.  Overdosage: If you think you have taken too much of this medicine contact a poison control center or emergency room at once.  NOTE: This medicine is only for you. Do not share this medicine with others.  What if I miss a dose?  It is important not to miss your dose. Call your doctor or health care professional if you are unable to keep an appointment.  What may interact with this medicine?  Interactions have not been studied.  This list may not describe all possible interactions. Give your health care provider a list of all the medicines, herbs, non-prescription drugs, or dietary supplements you use. Also tell them if you smoke, drink alcohol, or use illegal drugs. Some items may interact with your medicine.  What should I watch for while using this medicine?  This drug may make you  feel generally unwell. Continue your course of treatment even though you feel ill unless your doctor tells you to stop.  You may need blood work done while you are taking this medicine.  Do not become pregnant while taking this medicine or for 3 months after stopping it. Women should inform their doctor if they wish to become pregnant or think they might be pregnant. There is a potential for serious side effects to an unborn child. Talk to your health care professional or pharmacist for more information. Do not breast-feed an infant while taking this medicine or for 3 months after stopping it.  What side effects may I notice from receiving this medicine?  Side effects that you should report to your doctor or health care professional as soon as possible:  -allergic reactions like skin rash, itching or hives, swelling of the face, lips, or tongue  -black, tarry stools  -bloody or watery diarrhea  -breathing problems  -change in emotions or moods  -change in sex drive  -changes in vision  -chest pain or chest tightness  -chills  -confusion  -cough  -facial flushing  -fever  -headache  -signs and symptoms of high blood sugar such as dizziness; dry mouth; dry skin; fruity breath; nausea; stomach pain; increased hunger or thirst; increased urination  -signs and symptoms of liver injury like dark yellow or brown urine; general ill feeling or flu-like symptoms; light-colored stools; loss of appetite; nausea; right upper belly pain;   unusually weak or tired; yellowing of the eyes or skin  -stomach pain  -trouble passing urine or change in the amount of urine  -weight gain or weight loss  Side effects that usually do not require medical attention (report these to your doctor or health care professional if they continue or are bothersome):  -bone pain  -constipation  -loss of appetite  -muscle pain  -nausea  -swelling of the ankles, feet, hands  -tiredness  This list may not describe all possible side effects. Call your doctor  for medical advice about side effects. You may report side effects to FDA at 1-800-FDA-1088.  Where should I keep my medicine?  This drug is given in a hospital or clinic and will not be stored at home.  NOTE: This sheet is a summary. It may not cover all possible information. If you have questions about this medicine, talk to your doctor, pharmacist, or health care provider.  © 2018 [employer] (2015-08-24 15:50:36)

## 2017-05-01 NOTE — Progress Notes (Signed)
Hematology and Oncology Follow Up Visit  CHEVELLE DURR [id number] [birth year] 77 y.o. 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 Susman is here today with his grandson for follow-up and treatment. He is doing well with Durvalumab.  His SOB is stable. He has a cough with occasional clear sputum.  He has had dizziness daily and tripped and fell earlier this week. Thankfully he was not injured. His CMP reflects some dehydration. He states that he is hydrating at home and has a good appetite. His weight is stable.  No fever, chills, n/v, rash, chest pain, palpitations or changes in bowel or bladder habits.  He has occasional abdominal discomfort since his hernia repair earlier this year.  No lymphadenopathy noted on exam.  No episodes of bleeding, no bruising or petechiae.  No swelling, tenderness, numbness or tingling in his extremities. No c/o pain.   ECOG Performance Status: 2 - Symptomatic, <50% confined to bed  Medications:  Allergies as of 05/01/2017      Reactions   No Known Allergies       Medication List        Accurate as of 05/01/17 11:36 AM. Always use your most recent med list.          aspirin EC 81 MG tablet Take 81 mg by mouth daily.   atorvastatin 40 MG tablet Commonly known as:  LIPITOR TAKE 1 TABLET(40 MG) BY MOUTH DAILY   CARAFATE 1 GM/10ML suspension Generic drug:  sucralfate Take 10 mLs (1 g total) by mouth 2 (two) times daily.   HYDROcodone-acetaminophen 5-325 MG tablet Commonly known as:  NORCO/VICODIN Take 1 tablet by mouth every 6 (six) hours as needed for severe pain.   ipratropium-albuterol 0.5-2.5 (3) MG/3ML Soln Commonly known as:  DUONEB USE 1 VIAL VIA NEBULIZER EVERY 6 HOURS AS NEEDED  FOR SHORTNESS OF BREATH OR WHEEZING   COMBIVENT RESPIMAT 20-100 MCG/ACT Aers respimat Generic drug:  Ipratropium-Albuterol INHALE 1 PUFF BY MOUTH EVERY 6 HOURS AS NEEDED FOR WHEEZING   meclizine 12.5 MG tablet Commonly known as:  ANTIVERT Take 1 tablet (12.5 mg total) by mouth 3 (three) times daily as needed for dizziness.   metoprolol tartrate 25 MG tablet Commonly known as:  LOPRESSOR TAKE 1 TABLET(25 MG) BY MOUTH TWICE DAILY   multivitamin tablet Take 1 tablet by mouth daily. Centrum Silver   omeprazole 20 MG capsule Commonly known as:  PRILOSEC Take 1 capsule (20 mg total) by mouth daily.   STIOLTO RESPIMAT 2.5-2.5 MCG/ACT Aers Generic drug:  Tiotropium Bromide-Olodaterol INHALE 2 PUFFS BY MOUTH DAILY   sulfamethoxazole-trimethoprim 800-160 MG tablet Commonly known as:  BACTRIM DS,SEPTRA DS Take 1 tablet by mouth 2 (two) times daily.       Allergies:  Allergies  Allergen Reactions  . No Known Allergies     Past Medical History, Surgical history, Social history, and Family History were reviewed and updated.  Review of Systems: All other 10 point review of systems is negative.   Physical Exam:  weight is 169 lb (76.7 kg). His oral temperature is 97.1 F (36.2 C) (abnormal). His blood pressure is 122/59 (abnormal) and his pulse is 71. His respiration is 18 and  oxygen saturation is 100%.   Wt Readings from Last 3 Encounters:  05/01/17 169 lb (76.7 kg)  04/17/17 165 lb (74.8 kg)  04/03/17 168 lb 4 oz (76.3 kg)    Ocular: Sclerae unicteric, pupils equal, round and reactive to light Ear-nose-throat: Oropharynx clear, dentition fair Lymphatic: No cervical, supraclavicular or axillary adenopathy Lungs no rales or rhonchi, good excursion bilaterally Heart regular rate and rhythm, no murmur appreciated Abd soft, nontender, positive bowel sounds, no liver or spleen tip palpated on exam, no fluid wave  MSK no focal spinal tenderness, no joint edema Neuro: non-focal,  well-oriented, appropriate affect Breasts: Deferred   Lab Results  Component Value Date   WBC 5.8 05/01/2017   HGB 12.1 (L) 02/27/2017   HCT 34.4 (L) 05/01/2017   MCV 96.1 05/01/2017   PLT 160 05/01/2017   Lab Results  Component Value Date   IRON 66 06/02/2012   TIBC 378 06/02/2012   UIBC 312 06/02/2012   IRONPCTSAT 17 (L) 06/02/2012   Lab Results  Component Value Date   RBC 3.58 (L) 05/01/2017   No results found for: KPAFRELGTCHN, LAMBDASER, KAPLAMBRATIO No results found for: IGGSERUM, IGA, IGMSERUM No results found for: Odetta Pink, SPEI   Chemistry      Component Value Date/Time   NA 138 04/17/2017 1210   NA 133 02/06/2017 0757   NA 129 (L) 01/23/2017 1050   K 3.5 04/17/2017 1210   K 3.9 02/06/2017 0757   K 3.4 (L) 01/23/2017 1050   CL 103 04/17/2017 1210   CL 90 (L) 02/06/2017 0757   CO2 30 04/17/2017 1210   CO2 29 02/06/2017 0757   CO2 26 01/23/2017 1050   BUN 9 04/17/2017 1210   BUN 8 02/06/2017 0757   BUN 12.4 01/23/2017 1050   CREATININE 0.70 04/17/2017 1210   CREATININE 0.9 02/06/2017 0757   CREATININE 0.8 01/23/2017 1050      Component Value Date/Time   CALCIUM 9.1 04/17/2017 1210   CALCIUM 9.3 02/06/2017 0757   CALCIUM 8.9 01/23/2017 1050   ALKPHOS 78 04/17/2017 1210   ALKPHOS 79 02/06/2017 0757   ALKPHOS 80 01/23/2017 1050   AST 18 04/17/2017 1210   AST 39 (H) 01/23/2017 1050   ALT 14 04/17/2017 1210   ALT 43 02/06/2017 0757   ALT 27 01/23/2017 1050   BILITOT 0.7 04/17/2017 1210   BILITOT 0.51 01/23/2017 1050      Impression and Plan: Mr. Kuhl is a very pleasant 77 yo caucasian gentleman with locally advanced squamous cell carcinoma of the lung. He initially received chemotherapy and radiation therapy. He continues to do well with Durvalumab. We will proceed with treatment today as planned.  He was also given fluids today for dehydration. He will do his best to increase his fluid  intake at home.  We will repeat a PEt scan in May or June.  We will plan to see him back in another 4 weeks for follow-up and treatment.  He will contact our office with any questions or concerns. We can certainly see him sooner if need be.   Laverna Peace, NP 3/29/201911:36 AM

## 2017-05-01 NOTE — Patient Instructions (Signed)
Implanted Port Insertion, Care After °This sheet gives you information about how to care for yourself after your procedure. Your health care provider may also give you more specific instructions. If you have problems or questions, contact your health care provider. °What can I expect after the procedure? °After your procedure, it is common to have: °· Discomfort at the port insertion site. °· Bruising on the skin over the port. This should improve over 3-4 days. ° °Follow these instructions at home: °Port care °· After your port is placed, you will get a manufacturer's information card. The card has information about your port. Keep this card with you at all times. °· Take care of the port as told by your health care provider. Ask your health care provider if you or a family member can get training for taking care of the port at home. A home health care nurse may also take care of the port. °· Make sure to remember what type of port you have. °Incision care °· Follow instructions from your health care provider about how to take care of your port insertion site. Make sure you: °? Wash your hands with soap and water before you change your bandage (dressing). If soap and water are not available, use hand sanitizer. °? Change your dressing as told by your health care provider. °? Leave stitches (sutures), skin glue, or adhesive strips in place. These skin closures may need to stay in place for 2 weeks or longer. If adhesive strip edges start to loosen and curl up, you may trim the loose edges. Do not remove adhesive strips completely unless your health care provider tells you to do that. °· Check your port insertion site every day for signs of infection. Check for: °? More redness, swelling, or pain. °? More fluid or blood. °? Warmth. °? Pus or a bad smell. °General instructions °· Do not take baths, swim, or use a hot tub until your health care provider approves. °· Do not lift anything that is heavier than 10 lb (4.5  kg) for a week, or as told by your health care provider. °· Ask your health care provider when it is okay to: °? Return to work or school. °? Resume usual physical activities or sports. °· Do not drive for 24 hours if you were given a medicine to help you relax (sedative). °· Take over-the-counter and prescription medicines only as told by your health care provider. °· Wear a medical alert bracelet in case of an emergency. This will tell any health care providers that you have a port. °· Keep all follow-up visits as told by your health care provider. This is important. °Contact a health care provider if: °· You cannot flush your port with saline as directed, or you cannot draw blood from the port. °· You have a fever or chills. °· You have more redness, swelling, or pain around your port insertion site. °· You have more fluid or blood coming from your port insertion site. °· Your port insertion site feels warm to the touch. °· You have pus or a bad smell coming from the port insertion site. °Get help right away if: °· You have chest pain or shortness of breath. °· You have bleeding from your port that you cannot control. °Summary °· Take care of the port as told by your health care provider. °· Change your dressing as told by your health care provider. °· Keep all follow-up visits as told by your health care provider. °  This information is not intended to replace advice given to you by your health care provider. Make sure you discuss any questions you have with your health care provider. °Document Released: 11/10/2012 Document Revised: 12/12/2015 Document Reviewed: 12/12/2015 °[employer] Interactive Patient Education © 2017 [employer] ° °

## 2017-05-08 ENCOUNTER — Other Ambulatory Visit: Payer: Self-pay | Admitting: Family Medicine

## 2017-05-08 DIAGNOSIS — I10 Essential (primary) hypertension: Secondary | ICD-10-CM

## 2017-05-08 DIAGNOSIS — M7989 Other specified soft tissue disorders: Secondary | ICD-10-CM

## 2017-05-08 DIAGNOSIS — K4030 Unilateral inguinal hernia, with obstruction, without gangrene, not specified as recurrent: Secondary | ICD-10-CM

## 2017-05-08 MED ORDER — METOPROLOL TARTRATE 25 MG PO TABS: ORAL_TABLET | ORAL | 1 refills | Status: DC

## 2017-05-08 MED ORDER — HYDROCODONE-ACETAMINOPHEN 5-325 MG PO TABS: 1.0000 | ORAL_TABLET | Freq: Four times a day (QID) | ORAL | 0 refills | Status: DC | PRN

## 2017-05-08 MED ORDER — ATORVASTATIN CALCIUM 40 MG PO TABS: ORAL_TABLET | ORAL | 2 refills | Status: DC

## 2017-05-08 MED ORDER — CARAFATE 1 GM/10ML PO SUSP: 1.0000 g | Freq: Two times a day (BID) | ORAL | 6 refills | Status: DC

## 2017-05-08 NOTE — Telephone Encounter (Signed)
Pt needs refill on carafate, hydrocodone, atorvastatin, and metoprolol tartrate to [facility].

## 2017-05-08 NOTE — Telephone Encounter (Signed)
Patient is requesting a refill on Hydrocodone   LOV: 03/31/17  LRF:   03/31/17 

## 2017-05-12 ENCOUNTER — Encounter: Payer: Self-pay | Admitting: Family Medicine

## 2017-05-12 ENCOUNTER — Ambulatory Visit (INDEPENDENT_AMBULATORY_CARE_PROVIDER_SITE_OTHER): Payer: Medicare Other | Admitting: Family Medicine

## 2017-05-12 VITALS — BP 128/64 | HR 80 | Temp 98.1°F | Resp 18 | Ht 72.0 in | Wt 169.0 lb

## 2017-05-12 DIAGNOSIS — H6122 Impacted cerumen, left ear: Secondary | ICD-10-CM

## 2017-05-12 DIAGNOSIS — R42 Dizziness and giddiness: Secondary | ICD-10-CM | POA: Diagnosis not present

## 2017-05-12 NOTE — Progress Notes (Signed)
Subjective:    Patient ID: Bradley Meadows, male    DOB: 08/29/1940, 77 y.o.   MRN: [id number]  HPI  03/13/17 Recently had emergency surgery for an incarcerated hernia.  Since recovery for his surgery, his blood pressure has been very low.  Today is 92/50.  He reports dizziness upon standing.  He reports feeling lightheaded.  He denies any chest pain shortness of breath or dyspnea on exertion beyond his baseline.  He denies any blood in his stool or melena.  He denies any fevers or chills or signs of any infection.  At that time, my plan was: Patient's blood pressure is low.  There is no evidence of an infection.  He denies any fevers or chills or coughing or dysuria.  He has had no further diarrhea since discharge from the hospital.  Therefore I do not believe his low blood pressures from sepsis.  He denies any blood loss.  Therefore I will discontinue his Maxide.  I will reduce his Lopressor to 12.5 mg twice daily and recheck blood pressure on Monday.  If blood pressure still low Monday, we will discontinue Lopressor altogether and also check CBC as well as a workup for possible underlying infection.  However today at the patient appears to be overmedicated as clinically he appears well and is laughing and smiling and in no apparent distress.  I believe that the blood pressure medication is too much given all the recent stress his body has been under  03/27/17 Patient continues to feel weak and tired despite the fact his blood pressure is better than his last visit.  He also continues to feel dizzy.  Recently his cancer doctor put him on Dramamine for dizziness.  His blood pressure today is better although still low given his age.  He is also concerned by a large painful "boil" on his upper back.  On exam, there is a large red inflamed sebaceous cyst approximately 3 cm in diameter which is extremely tender to the touch and fluctuant.  It is spontaneously draining cyst sac contents through pores on the surface  of the skin.  At that time, my plan was: I  still feel that low blood pressure could be playing a role in his fatigue.  I believe his fatigue is likely multifactorial but I will have the patient temporarily discontinue metoprolol altogether and then reassess the patient next week to see if he is improving.  I anesthetized the inflamed sebaceous cyst on his upper back was 0.1% lidocaine with epinephrine.  I then made a 1 cm vertical incision in the center of the cyst and expressed cyst sac contents through the opening.  I then probed and cleaned the wound thoroughly with Q-tip soaked with hydrogen peroxide and then packed the cavity with approximately 10 inches of 1/4 inch iodoform gauze.  Recheck here on Tuesday.  Wound care was discussed with the patient's family.  05/12/17 Patient is here today requesting irrigation and lavage to remove a cerumen impaction in his left ear.  Right ear is relatively clear.  There is a cerumen impaction in the left ear.  Irrigation and lavage was attempted today in clinic however patient had a severe Arnold reflex and could not stop coughing.  He requested that we quit irrigation and lavage.  We were able to remove the majority of the wax but there is still a cerumen impaction in place.  I have recommended trying over-the-counter Murine eardrops for 5 days versus Debrox to try  to remove the residual impaction.  He also continues to complain of dizziness.  He has not discontinued metoprolol.  He states that he feels fine when he first wakes up in the morning however he takes his medication and begins to feel lightheaded and dizzy.  Throughout the day he suffers from dizziness and then approximately 1 hour before bedtime the dizziness will improve.  It seems to be related to taking his medication.  His cancer specialist did obtain carotid Dopplers which revealed no significant limitation in cerebral blood flow.  Patient refuses MRI to evaluate dizziness and he denies any vertigo  symptoms.   Past Medical History:  Diagnosis Date  . Arthritis   . Asthma   . CHF (congestive heart failure) (HCC)    ef=45-50%  . COPD (chronic obstructive pulmonary disease) ([place])    home O2  . Coronary artery disease    40-50% mLAD, 40% pRCA, 30% mRCA, occluded CX with retrograde filling 06/01/06 cath  . GERD (gastroesophageal reflux disease)    ulcers  . Goals of care, counseling/discussion 04/07/2016  . History of external beam radiation therapy 04/24/16-06/04/16   chest 60 Gy in 30 fractions  . Hyperlipidemia   . Hypertension   . 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 PVC's (premature ventricular contractions)    2017  . Squamous cell lung cancer, right ([place]) 04/07/2016   Past Surgical History:  Procedure Laterality Date  . ESOPHAGOGASTRODUODENOSCOPY (EGD) WITH PROPOFOL N/A 02/26/2016   Procedure: ESOPHAGOGASTRODUODENOSCOPY (EGD) WITH PROPOFOL;  Surgeon: Irene Shipper, MD;  Location: [facility];  Service: Endoscopy;  Laterality: N/A;  . EXPLORATORY LAPAROTOMY     for ulcer  "washed me out with a hose pipe"  . EYE SURGERY     cataracts  . HERNIA REPAIR    . INGUINAL HERNIA REPAIR Left 09/26/2016   Procedure: HERNIA REPAIR INGUINAL LEFT;  Surgeon: Jackolyn Confer, MD;  Location: [facility];  Service: General;  Laterality: Left;  With MESH  . INGUINAL HERNIA REPAIR Right 02/26/2017   Procedure: HERNIA REPAIR INGUINAL INCARCERATED WITH MESH;  Surgeon: Excell Seltzer, MD;  Location: [facility];  Service: General;  Laterality: Right;  . INSERTION OF MESH Right 02/26/2017   Procedure: INSERTION OF MESH;  Surgeon: Excell Seltzer, MD;  Location: [facility];  Service: General;  Laterality: Right;  . IR GENERIC HISTORICAL  04/10/2016   IR US GUIDE VASC ACCESS RIGHT 04/10/2016 Markus Daft, MD [facility]  . IR GENERIC HISTORICAL  04/10/2016   IR FLUORO GUIDE PORT INSERTION RIGHT 04/10/2016 Markus Daft, MD [facility]  . IR GENERIC HISTORICAL   04/10/2016   IR FLUORO RM 30-60 MIN 04/10/2016 Markus Daft, MD [facility]  . IR GENERIC HISTORICAL  04/18/2016   IR FLUORO RM 30-60 MIN 04/18/2016 Greggory Keen, MD [facility]  . JOINT REPLACEMENT     knee both  . PORTACATH PLACEMENT Right 04/10/2016  . VIDEO BRONCHOSCOPY WITH ENDOBRONCHIAL ULTRASOUND N/A 03/26/2016   Procedure: VIDEO BRONCHOSCOPY WITH ENDOBRONCHIAL ULTRASOUND;  Surgeon: Collene Gobble, MD;  Location: [facility];  Service: Thoracic;  Laterality: N/A;   Current Outpatient Medications on File Prior to Visit  Medication Sig Dispense Refill  . aspirin EC 81 MG tablet Take 81 mg by mouth daily.    [name] atorvastatin (LIPITOR) 40 MG tablet TAKE 1 TABLET(40 MG) BY MOUTH DAILY 90 tablet 2  . CARAFATE 1 GM/10ML suspension Take 10 mLs (1 g total) by mouth  2 (two) times daily. 420 mL 6  . COMBIVENT RESPIMAT 20-100 MCG/ACT AERS respimat INHALE 1 PUFF BY MOUTH EVERY 6 HOURS AS NEEDED FOR WHEEZING 4 g 5  . HYDROcodone-acetaminophen (NORCO/VICODIN) 5-325 MG tablet Take 1 tablet by mouth every 6 (six) hours as needed for severe pain. 60 tablet 0  . ipratropium-albuterol (DUONEB) 0.5-2.5 (3) MG/3ML SOLN USE 1 VIAL VIA NEBULIZER EVERY 6 HOURS AS NEEDED FOR SHORTNESS OF BREATH OR WHEEZING 990 mL 0  . meclizine (ANTIVERT) 12.5 MG tablet Take 1 tablet (12.5 mg total) by mouth 3 (three) times daily as needed for dizziness. 60 tablet 4  . metoprolol tartrate (LOPRESSOR) 25 MG tablet TAKE 1/2 TABLELET(12.5 MG) BY MOUTH TWICE DAILY 90 tablet 1  . Multiple Vitamin (MULTIVITAMIN) tablet Take 1 tablet by mouth daily. Centrum Silver    . omeprazole (PRILOSEC) 20 MG capsule Take 1 capsule (20 mg total) by mouth daily. 90 capsule 3  . STIOLTO RESPIMAT 2.5-2.5 MCG/ACT AERS INHALE 2 PUFFS BY MOUTH DAILY 4 g 5  . sulfamethoxazole-trimethoprim (BACTRIM DS,SEPTRA DS) 800-160 MG tablet Take 1 tablet by mouth 2 (two) times daily. 14 tablet 0   No current facility-administered medications on file prior to visit.     Allergies  Allergen Reactions  . No Known Allergies    Social History   Socioeconomic History  . Marital status: Divorced    Spouse name: Not on file  . Number of children: 3  . Years of education: 9  . Highest education level: Not on file  Occupational History  . Occupation: RetiredResearch officer, political party: RETIRED  Social Needs  . Financial resource strain: Not on file  . Food insecurity:    Worry: Not on file    Inability: Not on file  . Transportation needs:    Medical: Not on file    Non-medical: Not on file  Tobacco Use  . Smoking status: Former Smoker    Packs/day: 1.50    Years: 30.00    Pack years: 45.00    Types: Cigarettes    Last attempt to quit: 09/29/2015    Years since quitting: 1.6  . Smokeless tobacco: Never Used  Substance and Sexual Activity  . Alcohol use: No  . Drug use: No  . Sexual activity: Not Currently  Lifestyle  . Physical activity:    Days per week: Not on file    Minutes per session: Not on file  . Stress: Not on file  Relationships  . Social connections:    Talks on phone: Not on file    Gets together: Not on file    Attends religious service: Not on file    Active member of club or organization: Not on file    Attends meetings of clubs or organizations: Not on file    Relationship status: Not on file  . Intimate partner violence:    Fear of current or ex partner: Not on file    Emotionally abused: Not on file    Physically abused: Not on file    Forced sexual activity: Not on file  Other Topics Concern  . Not on file  Social History Narrative   Grew up in [place] area.    Left school after 9th grade. No father and had to work.    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       Health Care POA:    Emergency Contact: sister, Hector Brunswick (h) [phone]   End of Life Plan:  Who lives with you: self   Any pets: Lorrin Goodell   Diet: Pt limits sugars and starches and focuses on vegetables an protein. Pt has lost 50  lbs over several years.   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   Seatbelts: Pt reports wearing seatbelt when in vehicle.   Sun Exposure/Protection: Pt does not use sun protectin.   Hobbies: Racing, TV, gardening, Architect            Review of Systems  All other systems reviewed and are negative.      Objective:   Physical Exam  Constitutional: He is oriented to person, place, and time.  HENT:  Head: Normocephalic and atraumatic.  Right Ear: External ear normal.  Left Ear: External ear normal.  Nose: Nose normal.  Mouth/Throat: Oropharynx is clear and moist. No oropharyngeal exudate.  Cardiovascular: Normal rate, regular rhythm and normal heart sounds.  Pulmonary/Chest: Effort normal and breath sounds normal. No respiratory distress. He has no wheezes. He has no rales.  Abdominal: Soft. Bowel sounds are normal. He exhibits no distension. There is no tenderness. There is no rebound.  Neurological: He is alert and oriented to person, place, and time. No cranial nerve deficit. He exhibits normal muscle tone. Coordination normal.  Vitals reviewed.         Assessment & Plan:   Dizziness  Impacted cerumen of left ear  Patient will try over-the-counter Murine or Debrox eardrops to remove the residual impaction that we were unable to remove with irrigation and lavage.  I have recommended discontinuing metoprolol to see if relative hypotension could be causing the patient's dizziness particular given the fact symptoms worsen as soon as he takes his medication and improve the longer he is off the medication.  If symptoms do not improve, the next step would be an MRI of the brain.

## 2017-05-15 ENCOUNTER — Inpatient Hospital Stay: Payer: Medicare Other

## 2017-05-15 ENCOUNTER — Inpatient Hospital Stay: Payer: Medicare Other | Attending: Hematology & Oncology

## 2017-05-15 VITALS — BP 116/60 | HR 69 | Temp 98.2°F | Resp 18

## 2017-05-15 DIAGNOSIS — Z5112 Encounter for antineoplastic immunotherapy: Secondary | ICD-10-CM | POA: Insufficient documentation

## 2017-05-15 DIAGNOSIS — Z79899 Other long term (current) drug therapy: Secondary | ICD-10-CM | POA: Insufficient documentation

## 2017-05-15 DIAGNOSIS — C3491 Malignant neoplasm of unspecified part of right bronchus or lung: Secondary | ICD-10-CM

## 2017-05-15 LAB — CMP (CANCER CENTER ONLY)
ALT: 17 U/L (ref 10–47)
ANION GAP: 6 (ref 5–15)
AST: 18 U/L (ref 11–38)
Albumin: 3.5 g/dL (ref 3.5–5.0)
Alkaline Phosphatase: 75 U/L (ref 26–84)
BUN: 10 mg/dL (ref 7–22)
CALCIUM: 9.2 mg/dL (ref 8.0–10.3)
CO2: 31 mmol/L (ref 18–33)
Chloride: 99 mmol/L (ref 98–108)
Creatinine: 0.5 mg/dL — ABNORMAL LOW (ref 0.60–1.20)
GLUCOSE: 110 mg/dL (ref 73–118)
POTASSIUM: 4 mmol/L (ref 3.3–4.7)
Sodium: 136 mmol/L (ref 128–145)
TOTAL PROTEIN: 6.2 g/dL — AB (ref 6.4–8.1)
Total Bilirubin: 0.7 mg/dL (ref 0.2–1.6)

## 2017-05-15 LAB — CBC WITH DIFFERENTIAL (CANCER CENTER ONLY)
BASOS ABS: 0 10*3/uL (ref 0.0–0.1)
Basophils Relative: 0 %
EOS PCT: 6 %
Eosinophils Absolute: 0.3 10*3/uL (ref 0.0–0.5)
HEMATOCRIT: 35.6 % — AB (ref 38.7–49.9)
Hemoglobin: 11.9 g/dL — ABNORMAL LOW (ref 13.0–17.1)
LYMPHS ABS: 0.9 10*3/uL (ref 0.9–3.3)
LYMPHS PCT: 17 %
MCH: 32.1 pg (ref 28.0–33.4)
MCHC: 33.4 g/dL (ref 32.0–35.9)
MCV: 96 fL (ref 82.0–98.0)
MONO ABS: 0.5 10*3/uL (ref 0.1–0.9)
MONOS PCT: 9 %
NEUTROS ABS: 3.9 10*3/uL (ref 1.5–6.5)
Neutrophils Relative %: 68 %
PLATELETS: 148 10*3/uL (ref 145–400)
RBC: 3.71 MIL/uL — ABNORMAL LOW (ref 4.20–5.70)
RDW: 13.8 % (ref 11.1–15.7)
WBC Count: 5.7 10*3/uL (ref 4.0–10.0)

## 2017-05-15 LAB — LACTATE DEHYDROGENASE: LDH: 168 U/L (ref 125–245)

## 2017-05-15 MED ORDER — SODIUM CHLORIDE 0.9 % IV SOLN
Freq: Once | INTRAVENOUS | Status: AC
  Administered 2017-05-15: 11:00:00 via INTRAVENOUS

## 2017-05-15 MED ORDER — DURVALUMAB 500 MG/10ML IV SOLN
740.0000 mg | Freq: Once | INTRAVENOUS | Status: AC
  Administered 2017-05-15: 740 mg via INTRAVENOUS
  Filled 2017-05-15: qty 4.8

## 2017-05-15 MED ORDER — SODIUM CHLORIDE 0.9% FLUSH
10.0000 mL | INTRAVENOUS | Status: DC | PRN
  Administered 2017-05-15: 10 mL
  Filled 2017-05-15: qty 10

## 2017-05-15 MED ORDER — HEPARIN SOD (PORK) LOCK FLUSH 100 UNIT/ML IV SOLN
500.0000 [IU] | Freq: Once | INTRAVENOUS | Status: AC | PRN
  Administered 2017-05-15: 500 [IU]
  Filled 2017-05-15: qty 5

## 2017-05-15 NOTE — Patient Instructions (Signed)
Durvalumab injection  What is this medicine?  DURVALUMAB (dur VAL ue mab) is a monoclonal antibody. It is used to treat urothelial cancer.  This medicine may be used for other purposes; ask your health care provider or pharmacist if you have questions.  COMMON BRAND NAME(S): IMFINZI  What should I tell my health care provider before I take this medicine?  They need to know if you have any of these conditions:  -diabetes  -immune system problems  -infection  -inflammatory bowel disease  -kidney disease  -liver disease  -lung or breathing disease  -lupus  -organ transplant  -stomach or intestine problems  -thyroid disease  -an unusual or allergic reaction to durvalumab, other medicines, foods, dyes, or preservatives  -pregnant or trying to get pregnant  -breast-feeding  How should I use this medicine?  This medicine is for infusion into a vein. It is given by a health care professional in a hospital or clinic setting.  A special MedGuide will be given to you before each treatment. Be sure to read this information carefully each time.  Talk to your pediatrician regarding the use of this medicine in children. Special care may be needed.  Overdosage: If you think you have taken too much of this medicine contact a poison control center or emergency room at once.  NOTE: This medicine is only for you. Do not share this medicine with others.  What if I miss a dose?  It is important not to miss your dose. Call your doctor or health care professional if you are unable to keep an appointment.  What may interact with this medicine?  Interactions have not been studied.  This list may not describe all possible interactions. Give your health care provider a list of all the medicines, herbs, non-prescription drugs, or dietary supplements you use. Also tell them if you smoke, drink alcohol, or use illegal drugs. Some items may interact with your medicine.  What should I watch for while using this medicine?  This drug may make you  feel generally unwell. Continue your course of treatment even though you feel ill unless your doctor tells you to stop.  You may need blood work done while you are taking this medicine.  Do not become pregnant while taking this medicine or for 3 months after stopping it. Women should inform their doctor if they wish to become pregnant or think they might be pregnant. There is a potential for serious side effects to an unborn child. Talk to your health care professional or pharmacist for more information. Do not breast-feed an infant while taking this medicine or for 3 months after stopping it.  What side effects may I notice from receiving this medicine?  Side effects that you should report to your doctor or health care professional as soon as possible:  -allergic reactions like skin rash, itching or hives, swelling of the face, lips, or tongue  -black, tarry stools  -bloody or watery diarrhea  -breathing problems  -change in emotions or moods  -change in sex drive  -changes in vision  -chest pain or chest tightness  -chills  -confusion  -cough  -facial flushing  -fever  -headache  -signs and symptoms of high blood sugar such as dizziness; dry mouth; dry skin; fruity breath; nausea; stomach pain; increased hunger or thirst; increased urination  -signs and symptoms of liver injury like dark yellow or brown urine; general ill feeling or flu-like symptoms; light-colored stools; loss of appetite; nausea; right upper belly pain;   unusually weak or tired; yellowing of the eyes or skin  -stomach pain  -trouble passing urine or change in the amount of urine  -weight gain or weight loss  Side effects that usually do not require medical attention (report these to your doctor or health care professional if they continue or are bothersome):  -bone pain  -constipation  -loss of appetite  -muscle pain  -nausea  -swelling of the ankles, feet, hands  -tiredness  This list may not describe all possible side effects. Call your doctor  for medical advice about side effects. You may report side effects to FDA at 1-800-FDA-1088.  Where should I keep my medicine?  This drug is given in a hospital or clinic and will not be stored at home.  NOTE: This sheet is a summary. It may not cover all possible information. If you have questions about this medicine, talk to your doctor, pharmacist, or health care provider.  © 2018 [employer] (2015-08-24 15:50:36)

## 2017-05-28 ENCOUNTER — Other Ambulatory Visit: Payer: Self-pay | Admitting: Family

## 2017-05-28 DIAGNOSIS — E032 Hypothyroidism due to medicaments and other exogenous substances: Secondary | ICD-10-CM

## 2017-05-28 DIAGNOSIS — C3491 Malignant neoplasm of unspecified part of right bronchus or lung: Secondary | ICD-10-CM

## 2017-05-29 ENCOUNTER — Other Ambulatory Visit: Payer: Self-pay

## 2017-05-29 ENCOUNTER — Encounter: Payer: Self-pay | Admitting: Family

## 2017-05-29 ENCOUNTER — Inpatient Hospital Stay: Payer: Medicare Other

## 2017-05-29 ENCOUNTER — Inpatient Hospital Stay (HOSPITAL_BASED_OUTPATIENT_CLINIC_OR_DEPARTMENT_OTHER): Payer: Medicare Other | Admitting: Family

## 2017-05-29 VITALS — BP 110/57 | HR 69 | Temp 97.9°F | Resp 20 | Wt 168.0 lb

## 2017-05-29 DIAGNOSIS — C3491 Malignant neoplasm of unspecified part of right bronchus or lung: Secondary | ICD-10-CM | POA: Diagnosis not present

## 2017-05-29 DIAGNOSIS — R5383 Other fatigue: Secondary | ICD-10-CM

## 2017-05-29 DIAGNOSIS — E032 Hypothyroidism due to medicaments and other exogenous substances: Secondary | ICD-10-CM

## 2017-05-29 DIAGNOSIS — Z79899 Other long term (current) drug therapy: Secondary | ICD-10-CM | POA: Diagnosis not present

## 2017-05-29 DIAGNOSIS — R42 Dizziness and giddiness: Secondary | ICD-10-CM

## 2017-05-29 DIAGNOSIS — Z5112 Encounter for antineoplastic immunotherapy: Secondary | ICD-10-CM | POA: Diagnosis not present

## 2017-05-29 LAB — CBC WITH DIFFERENTIAL (CANCER CENTER ONLY)
BASOS PCT: 0 %
Basophils Absolute: 0 10*3/uL (ref 0.0–0.1)
EOS ABS: 0.3 10*3/uL (ref 0.0–0.5)
Eosinophils Relative: 6 %
HCT: 35 % — ABNORMAL LOW (ref 38.7–49.9)
Hemoglobin: 11.7 g/dL — ABNORMAL LOW (ref 13.0–17.1)
Lymphocytes Relative: 18 %
Lymphs Abs: 1 10*3/uL (ref 0.9–3.3)
MCH: 31.9 pg (ref 28.0–33.4)
MCHC: 33.4 g/dL (ref 32.0–35.9)
MCV: 95.4 fL (ref 82.0–98.0)
MONO ABS: 0.6 10*3/uL (ref 0.1–0.9)
MONOS PCT: 11 %
NEUTROS PCT: 65 %
Neutro Abs: 3.5 10*3/uL (ref 1.5–6.5)
Platelet Count: 147 10*3/uL (ref 145–400)
RBC: 3.67 MIL/uL — ABNORMAL LOW (ref 4.20–5.70)
RDW: 13.3 % (ref 11.1–15.7)
WBC Count: 5.3 10*3/uL (ref 4.0–10.0)

## 2017-05-29 LAB — CMP (CANCER CENTER ONLY)
ALBUMIN: 3.4 g/dL — AB (ref 3.5–5.0)
ALT: 20 U/L (ref 10–47)
AST: 19 U/L (ref 11–38)
Alkaline Phosphatase: 72 U/L (ref 26–84)
Anion gap: 6 (ref 5–15)
BUN: 13 mg/dL (ref 7–22)
CHLORIDE: 100 mmol/L (ref 98–108)
CO2: 30 mmol/L (ref 18–33)
CREATININE: 0.9 mg/dL (ref 0.60–1.20)
Calcium: 9.3 mg/dL (ref 8.0–10.3)
GLUCOSE: 126 mg/dL — AB (ref 73–118)
Potassium: 3.8 mmol/L (ref 3.3–4.7)
SODIUM: 136 mmol/L (ref 128–145)
Total Bilirubin: 0.7 mg/dL (ref 0.2–1.6)
Total Protein: 5.9 g/dL — ABNORMAL LOW (ref 6.4–8.1)

## 2017-05-29 LAB — LACTATE DEHYDROGENASE: LDH: 148 U/L (ref 125–245)

## 2017-05-29 MED ORDER — SODIUM CHLORIDE 0.9 % IV SOLN
740.0000 mg | Freq: Once | INTRAVENOUS | Status: AC
  Administered 2017-05-29: 740 mg via INTRAVENOUS
  Filled 2017-05-29: qty 10

## 2017-05-29 MED ORDER — SODIUM CHLORIDE 0.9% FLUSH
10.0000 mL | INTRAVENOUS | Status: DC | PRN
  Administered 2017-05-29: 10 mL
  Filled 2017-05-29: qty 10

## 2017-05-29 MED ORDER — SODIUM CHLORIDE 0.9 % IV SOLN
Freq: Once | INTRAVENOUS | Status: AC
  Administered 2017-05-29: 13:00:00 via INTRAVENOUS

## 2017-05-29 MED ORDER — HEPARIN SOD (PORK) LOCK FLUSH 100 UNIT/ML IV SOLN
500.0000 [IU] | Freq: Once | INTRAVENOUS | Status: AC | PRN
  Administered 2017-05-29: 500 [IU]
  Filled 2017-05-29: qty 5

## 2017-05-29 NOTE — Patient Instructions (Signed)
Implanted Port Home Guide An implanted port is a type of central line that is placed under the skin. Central lines are used to provide IV access when treatment or nutrition needs to be given through a person's veins. Implanted ports are used for long-term IV access. An implanted port may be placed because:  You need IV medicine that would be irritating to the small veins in your hands or arms.  You need long-term IV medicines, such as antibiotics.  You need IV nutrition for a long period.  You need frequent blood draws for lab tests.  You need dialysis.  Implanted ports are usually placed in the chest area, but they can also be placed in the upper arm, the abdomen, or the leg. An implanted port has two main parts:  Reservoir. The reservoir is round and will appear as a small, raised area under your skin. The reservoir is the part where a needle is inserted to give medicines or draw blood.  Catheter. The catheter is a thin, flexible tube that extends from the reservoir. The catheter is placed into a large vein. Medicine that is inserted into the reservoir goes into the catheter and then into the vein.  How will I care for my incision site? Do not get the incision site wet. Bathe or shower as directed by your health care provider. How is my port accessed? Special steps must be taken to access the port:  Before the port is accessed, a numbing cream can be placed on the skin. This helps numb the skin over the port site.  Your health care provider uses a sterile technique to access the port. ? Your health care provider must put on a mask and sterile gloves. ? The skin over your port is cleaned carefully with an antiseptic and allowed to dry. ? The port is gently pinched between sterile gloves, and a needle is inserted into the port.  Only "non-coring" port needles should be used to access the port. Once the port is accessed, a blood return should be checked. This helps ensure that the port  is in the vein and is not clogged.  If your port needs to remain accessed for a constant infusion, a clear (transparent) bandage will be placed over the needle site. The bandage and needle will need to be changed every week, or as directed by your health care provider.  Keep the bandage covering the needle clean and dry. Do not get it wet. Follow your health care provider's instructions on how to take a shower or bath while the port is accessed.  If your port does not need to stay accessed, no bandage is needed over the port.  What is flushing? Flushing helps keep the port from getting clogged. Follow your health care provider's instructions on how and when to flush the port. Ports are usually flushed with saline solution or a medicine called heparin. The need for flushing will depend on how the port is used.  If the port is used for intermittent medicines or blood draws, the port will need to be flushed: ? After medicines have been given. ? After blood has been drawn. ? As part of routine maintenance.  If a constant infusion is running, the port may not need to be flushed.  How long will my port stay implanted? The port can stay in for as long as your health care provider thinks it is needed. When it is time for the port to come out, surgery will be   done to remove it. The procedure is similar to the one performed when the port was put in. When should I seek immediate medical care? When you have an implanted port, you should seek immediate medical care if:  You notice a bad smell coming from the incision site.  You have swelling, redness, or drainage at the incision site.  You have more swelling or pain at the port site or the surrounding area.  You have a fever that is not controlled with medicine.  This information is not intended to replace advice given to you by your health care provider. Make sure you discuss any questions you have with your health care provider. Document  Released: 01/20/2005 Document Revised: 06/28/2015 Document Reviewed: 09/27/2012 Elsevier Interactive Patient Education  2017 Elsevier Inc.  

## 2017-05-29 NOTE — Patient Instructions (Signed)
Durvalumab injection  What is this medicine?  DURVALUMAB (dur VAL ue mab) is a monoclonal antibody. It is used to treat urothelial cancer.  This medicine may be used for other purposes; ask your health care provider or pharmacist if you have questions.  COMMON BRAND NAME(S): IMFINZI  What should I tell my health care provider before I take this medicine?  They need to know if you have any of these conditions:  -diabetes  -immune system problems  -infection  -inflammatory bowel disease  -kidney disease  -liver disease  -lung or breathing disease  -lupus  -organ transplant  -stomach or intestine problems  -thyroid disease  -an unusual or allergic reaction to durvalumab, other medicines, foods, dyes, or preservatives  -pregnant or trying to get pregnant  -breast-feeding  How should I use this medicine?  This medicine is for infusion into a vein. It is given by a health care professional in a hospital or clinic setting.  A special MedGuide will be given to you before each treatment. Be sure to read this information carefully each time.  Talk to your pediatrician regarding the use of this medicine in children. Special care may be needed.  Overdosage: If you think you have taken too much of this medicine contact a poison control center or emergency room at once.  NOTE: This medicine is only for you. Do not share this medicine with others.  What if I miss a dose?  It is important not to miss your dose. Call your doctor or health care professional if you are unable to keep an appointment.  What may interact with this medicine?  Interactions have not been studied.  This list may not describe all possible interactions. Give your health care provider a list of all the medicines, herbs, non-prescription drugs, or dietary supplements you use. Also tell them if you smoke, drink alcohol, or use illegal drugs. Some items may interact with your medicine.  What should I watch for while using this medicine?  This drug may make you  feel generally unwell. Continue your course of treatment even though you feel ill unless your doctor tells you to stop.  You may need blood work done while you are taking this medicine.  Do not become pregnant while taking this medicine or for 3 months after stopping it. Women should inform their doctor if they wish to become pregnant or think they might be pregnant. There is a potential for serious side effects to an unborn child. Talk to your health care professional or pharmacist for more information. Do not breast-feed an infant while taking this medicine or for 3 months after stopping it.  What side effects may I notice from receiving this medicine?  Side effects that you should report to your doctor or health care professional as soon as possible:  -allergic reactions like skin rash, itching or hives, swelling of the face, lips, or tongue  -black, tarry stools  -bloody or watery diarrhea  -breathing problems  -change in emotions or moods  -change in sex drive  -changes in vision  -chest pain or chest tightness  -chills  -confusion  -cough  -facial flushing  -fever  -headache  -signs and symptoms of high blood sugar such as dizziness; dry mouth; dry skin; fruity breath; nausea; stomach pain; increased hunger or thirst; increased urination  -signs and symptoms of liver injury like dark yellow or brown urine; general ill feeling or flu-like symptoms; light-colored stools; loss of appetite; nausea; right upper belly pain;   unusually weak or tired; yellowing of the eyes or skin  -stomach pain  -trouble passing urine or change in the amount of urine  -weight gain or weight loss  Side effects that usually do not require medical attention (report these to your doctor or health care professional if they continue or are bothersome):  -bone pain  -constipation  -loss of appetite  -muscle pain  -nausea  -swelling of the ankles, feet, hands  -tiredness  This list may not describe all possible side effects. Call your doctor  for medical advice about side effects. You may report side effects to FDA at 1-800-FDA-1088.  Where should I keep my medicine?  This drug is given in a hospital or clinic and will not be stored at home.  NOTE: This sheet is a summary. It may not cover all possible information. If you have questions about this medicine, talk to your doctor, pharmacist, or health care provider.  © 2018 [employer] (2015-08-24 15:50:36)

## 2017-05-29 NOTE — Progress Notes (Signed)
Hematology and Oncology Follow Up Visit  Bradley Meadows [id number] [birth year] 77 y.o. 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 He is doing well but having some fatigue. He did not sleep well last night.  He still has dizziness in the mornings that dissipates throughout the day. His PCP has adjusted his BP medication and that has not made a difference. He has also tried taking Antivert.  He stumbled earlier this week and bumped his left shoulder on a door frame. He has a bruise on his shoulder from it.  His SOB will get a little worse when he is outside in the pollen. Otherwise this has remained stable.  No fever, chills, n/v, cough, rash, dizziness, SOB, chest pain, palpitations, abdominal pain or changed in bowel or bladder habits.  No episodes of bleeding, bruising in excess or petechiae.  No lymphadenopathy noted on exam.  No swelling, tenderness, numbness or tingling in his extremities at this time. No c/o pain.  He has a good appetite and is staying well hydrated. His weight is stable.   ECOG Performance Status: 1 - Symptomatic but completely ambulatory  Medications:  Allergies as of 05/29/2017      Reactions   No Known Allergies       Medication List        Accurate as of 05/29/17 12:49 PM. Always use your most recent med list.          aspirin EC 81 MG tablet Take 81 mg by mouth daily.   atorvastatin 40 MG tablet Commonly known as:  LIPITOR TAKE 1 TABLET(40 MG) BY MOUTH DAILY   CARAFATE 1 GM/10ML suspension Generic drug:  sucralfate Take 10 mLs (1 g total) by mouth 2 (two) times daily.   HYDROcodone-acetaminophen 5-325 MG tablet Commonly known as:   NORCO/VICODIN Take 1 tablet by mouth every 6 (six) hours as needed for severe pain.   ipratropium-albuterol 0.5-2.5 (3) MG/3ML Soln Commonly known as:  DUONEB USE 1 VIAL VIA NEBULIZER EVERY 6 HOURS AS NEEDED FOR SHORTNESS OF BREATH OR WHEEZING   COMBIVENT RESPIMAT 20-100 MCG/ACT Aers respimat Generic drug:  Ipratropium-Albuterol INHALE 1 PUFF BY MOUTH EVERY 6 HOURS AS NEEDED FOR WHEEZING   meclizine 12.5 MG tablet Commonly known as:  ANTIVERT Take 1 tablet (12.5 mg total) by mouth 3 (three) times daily as needed for dizziness.   metoprolol tartrate 25 MG tablet Commonly known as:  LOPRESSOR TAKE 1/2 TABLELET(12.5 MG) BY MOUTH TWICE DAILY   multivitamin tablet Take 1 tablet by mouth daily. Centrum Silver   omeprazole 20 MG capsule Commonly known as:  PRILOSEC Take 1 capsule (20 mg total) by mouth daily.   STIOLTO RESPIMAT 2.5-2.5 MCG/ACT Aers Generic drug:  Tiotropium Bromide-Olodaterol INHALE 2 PUFFS BY MOUTH DAILY       Allergies:  Allergies  Allergen Reactions  . No Known Allergies     Past Medical History, Surgical history, Social history, and Family History were reviewed and updated.  Review of Systems: All other 10 point review of systems is negative.   Physical Exam:  weight is 168 lb (76.2 kg). His oral temperature is 97.9 F (  36.6 C). His blood pressure is 110/57 (abnormal) and his pulse is 69. His respiration is 20 and oxygen saturation is 99%.   Wt Readings from Last 3 Encounters:  05/29/17 168 lb (76.2 kg)  05/12/17 169 lb (76.7 kg)  05/01/17 169 lb (76.7 kg)    Ocular: Sclerae unicteric, pupils equal, round and reactive to light Ear-nose-throat: Oropharynx clear, dentition fair Lymphatic: No cervical, supraclavicular or axillary adenopathy Lungs no rales or rhonchi, good excursion bilaterally Heart regular rate and rhythm, no murmur appreciated Abd soft, nontender, positive bowel sounds, no liver or spleen tip palpated on exam, no fluid wave    MSK no focal spinal tenderness, no joint edema Neuro: non-focal, well-oriented, appropriate affect Breasts: Deferred   Lab Results  Component Value Date   WBC 5.7 05/15/2017   HGB 11.9 (L) 05/15/2017   HCT 35.6 (L) 05/15/2017   MCV 96.0 05/15/2017   PLT 148 05/15/2017   Lab Results  Component Value Date   IRON 66 06/02/2012   TIBC 378 06/02/2012   UIBC 312 06/02/2012   IRONPCTSAT 17 (L) 06/02/2012   Lab Results  Component Value Date   RBC 3.71 (L) 05/15/2017   No results found for: KPAFRELGTCHN, LAMBDASER, KAPLAMBRATIO No results found for: IGGSERUM, IGA, IGMSERUM No results found for: Odetta Pink, SPEI   Chemistry      Component Value Date/Time   NA 136 05/15/2017 1105   NA 133 02/06/2017 0757   NA 129 (L) 01/23/2017 1050   K 4.0 05/15/2017 1105   K 3.9 02/06/2017 0757   K 3.4 (L) 01/23/2017 1050   CL 99 05/15/2017 1105   CL 90 (L) 02/06/2017 0757   CO2 31 05/15/2017 1105   CO2 29 02/06/2017 0757   CO2 26 01/23/2017 1050   BUN 10 05/15/2017 1105   BUN 8 02/06/2017 0757   BUN 12.4 01/23/2017 1050   CREATININE 0.50 (L) 05/15/2017 1105   CREATININE 0.9 02/06/2017 0757   CREATININE 0.8 01/23/2017 1050      Component Value Date/Time   CALCIUM 9.2 05/15/2017 1105   CALCIUM 9.3 02/06/2017 0757   CALCIUM 8.9 01/23/2017 1050   ALKPHOS 75 05/15/2017 1105   ALKPHOS 79 02/06/2017 0757   ALKPHOS 80 01/23/2017 1050   AST 18 05/15/2017 1105   AST 39 (H) 01/23/2017 1050   ALT 17 05/15/2017 1105   ALT 43 02/06/2017 0757   ALT 27 01/23/2017 1050   BILITOT 0.7 05/15/2017 1105   BILITOT 0.51 01/23/2017 1050      Impression and Plan: Bradley Meadows is a very pleasant 77 yo caucasian gentleman with locally advanced squamous cell carcinoma of the lung. He initially received chemotherapy and radiation therapy. He is doing well with his current treatment regimen and we will proceed with cycle 11 today as planned.  We will  schedule his PET scan for in the next 2-3 weeks.  We will plan to see him back in another 4 weeks for follow-up with treatment every 2 weeks.  They will contact our office with any questions or concerns. We can certainly see him sooner if need be.   Laverna Peace, NP 4/26/201912:49 PM

## 2017-06-01 LAB — TSH: TSH: 0.947 u[IU]/mL (ref 0.320–4.118)

## 2017-06-08 ENCOUNTER — Other Ambulatory Visit: Payer: Self-pay | Admitting: Family Medicine

## 2017-06-08 DIAGNOSIS — K4030 Unilateral inguinal hernia, with obstruction, without gangrene, not specified as recurrent: Secondary | ICD-10-CM

## 2017-06-08 DIAGNOSIS — M7989 Other specified soft tissue disorders: Secondary | ICD-10-CM

## 2017-06-08 NOTE — Telephone Encounter (Signed)
Patient needs refills on his carafate, hydrocodone, stiolto and combivent  Sent to [facility] if possible  [phone]

## 2017-06-08 NOTE — Telephone Encounter (Signed)
Patient is requesting a refill on Hydrocodone   LOV: 05/12/17  LRF:   05/08/17

## 2017-06-09 MED ORDER — HYDROCODONE-ACETAMINOPHEN 5-325 MG PO TABS: 1.0000 | ORAL_TABLET | Freq: Four times a day (QID) | ORAL | 0 refills | Status: DC | PRN

## 2017-06-09 MED ORDER — TIOTROPIUM BROMIDE-OLODATEROL 2.5-2.5 MCG/ACT IN AERS: 2.0000 | INHALATION_SPRAY | Freq: Every day | RESPIRATORY_TRACT | 5 refills | Status: DC

## 2017-06-09 MED ORDER — IPRATROPIUM-ALBUTEROL 20-100 MCG/ACT IN AERS: INHALATION_SPRAY | RESPIRATORY_TRACT | 5 refills | Status: DC

## 2017-06-09 MED ORDER — CARAFATE 1 GM/10ML PO SUSP: 1.0000 g | Freq: Two times a day (BID) | ORAL | 6 refills | Status: DC

## 2017-06-12 ENCOUNTER — Inpatient Hospital Stay: Payer: Medicare Other

## 2017-06-12 ENCOUNTER — Inpatient Hospital Stay: Payer: Medicare Other | Attending: Hematology & Oncology | Admitting: Family

## 2017-06-12 ENCOUNTER — Other Ambulatory Visit: Payer: Self-pay

## 2017-06-12 VITALS — BP 117/53 | HR 69 | Temp 97.7°F | Resp 22 | Wt 170.8 lb

## 2017-06-12 DIAGNOSIS — D508 Other iron deficiency anemias: Secondary | ICD-10-CM | POA: Insufficient documentation

## 2017-06-12 DIAGNOSIS — E559 Vitamin D deficiency, unspecified: Secondary | ICD-10-CM

## 2017-06-12 DIAGNOSIS — R5383 Other fatigue: Secondary | ICD-10-CM

## 2017-06-12 DIAGNOSIS — C3491 Malignant neoplasm of unspecified part of right bronchus or lung: Secondary | ICD-10-CM

## 2017-06-12 DIAGNOSIS — R42 Dizziness and giddiness: Secondary | ICD-10-CM

## 2017-06-12 DIAGNOSIS — E032 Hypothyroidism due to medicaments and other exogenous substances: Secondary | ICD-10-CM

## 2017-06-12 DIAGNOSIS — Z5112 Encounter for antineoplastic immunotherapy: Secondary | ICD-10-CM | POA: Insufficient documentation

## 2017-06-12 DIAGNOSIS — Z79899 Other long term (current) drug therapy: Secondary | ICD-10-CM | POA: Insufficient documentation

## 2017-06-12 LAB — CMP (CANCER CENTER ONLY)
ALT: 21 U/L (ref 10–47)
ANION GAP: 7 (ref 5–15)
AST: 14 U/L (ref 11–38)
Albumin: 3.4 g/dL — ABNORMAL LOW (ref 3.5–5.0)
Alkaline Phosphatase: 75 U/L (ref 26–84)
BUN: 9 mg/dL (ref 7–22)
CO2: 30 mmol/L (ref 18–33)
Calcium: 9.1 mg/dL (ref 8.0–10.3)
Chloride: 101 mmol/L (ref 98–108)
Creatinine: 0.9 mg/dL (ref 0.60–1.20)
GLUCOSE: 108 mg/dL (ref 73–118)
POTASSIUM: 4.2 mmol/L (ref 3.3–4.7)
SODIUM: 138 mmol/L (ref 128–145)
TOTAL PROTEIN: 6 g/dL — AB (ref 6.4–8.1)
Total Bilirubin: 0.7 mg/dL (ref 0.2–1.6)

## 2017-06-12 LAB — CBC WITH DIFFERENTIAL (CANCER CENTER ONLY)
Basophils Absolute: 0 10*3/uL (ref 0.0–0.1)
Basophils Relative: 1 %
EOS ABS: 0.4 10*3/uL (ref 0.0–0.5)
EOS PCT: 7 %
HCT: 34.7 % — ABNORMAL LOW (ref 38.7–49.9)
Hemoglobin: 11.8 g/dL — ABNORMAL LOW (ref 13.0–17.1)
LYMPHS ABS: 0.9 10*3/uL (ref 0.9–3.3)
LYMPHS PCT: 16 %
MCH: 32 pg (ref 28.0–33.4)
MCHC: 34 g/dL (ref 32.0–35.9)
MCV: 94 fL (ref 82.0–98.0)
MONO ABS: 0.5 10*3/uL (ref 0.1–0.9)
Monocytes Relative: 9 %
Neutro Abs: 4.1 10*3/uL (ref 1.5–6.5)
Neutrophils Relative %: 67 %
PLATELETS: 145 10*3/uL (ref 145–400)
RBC: 3.69 MIL/uL — ABNORMAL LOW (ref 4.20–5.70)
RDW: 13 % (ref 11.1–15.7)
WBC: 6 10*3/uL (ref 4.0–10.0)

## 2017-06-12 LAB — LACTATE DEHYDROGENASE: LDH: 170 U/L (ref 125–245)

## 2017-06-12 MED ORDER — HEPARIN SOD (PORK) LOCK FLUSH 100 UNIT/ML IV SOLN
500.0000 [IU] | Freq: Once | INTRAVENOUS | Status: AC | PRN
  Administered 2017-06-12: 500 [IU]
  Filled 2017-06-12: qty 5

## 2017-06-12 MED ORDER — SODIUM CHLORIDE 0.9% FLUSH
10.0000 mL | INTRAVENOUS | Status: DC | PRN
  Administered 2017-06-12: 10 mL
  Filled 2017-06-12: qty 10

## 2017-06-12 MED ORDER — SODIUM CHLORIDE 0.9 % IV SOLN
740.0000 mg | Freq: Once | INTRAVENOUS | Status: AC
  Administered 2017-06-12: 740 mg via INTRAVENOUS
  Filled 2017-06-12: qty 4.8

## 2017-06-12 MED ORDER — SODIUM CHLORIDE 0.9 % IV SOLN
Freq: Once | INTRAVENOUS | Status: AC
  Administered 2017-06-12: 13:00:00 via INTRAVENOUS

## 2017-06-12 NOTE — Patient Instructions (Signed)
Durvalumab injection  What is this medicine?  DURVALUMAB (dur VAL ue mab) is a monoclonal antibody. It is used to treat urothelial cancer.  This medicine may be used for other purposes; ask your health care provider or pharmacist if you have questions.  COMMON BRAND NAME(S): IMFINZI  What should I tell my health care provider before I take this medicine?  They need to know if you have any of these conditions:  -diabetes  -immune system problems  -infection  -inflammatory bowel disease  -kidney disease  -liver disease  -lung or breathing disease  -lupus  -organ transplant  -stomach or intestine problems  -thyroid disease  -an unusual or allergic reaction to durvalumab, other medicines, foods, dyes, or preservatives  -pregnant or trying to get pregnant  -breast-feeding  How should I use this medicine?  This medicine is for infusion into a vein. It is given by a health care professional in a hospital or clinic setting.  A special MedGuide will be given to you before each treatment. Be sure to read this information carefully each time.  Talk to your pediatrician regarding the use of this medicine in children. Special care may be needed.  Overdosage: If you think you have taken too much of this medicine contact a poison control center or emergency room at once.  NOTE: This medicine is only for you. Do not share this medicine with others.  What if I miss a dose?  It is important not to miss your dose. Call your doctor or health care professional if you are unable to keep an appointment.  What may interact with this medicine?  Interactions have not been studied.  This list may not describe all possible interactions. Give your health care provider a list of all the medicines, herbs, non-prescription drugs, or dietary supplements you use. Also tell them if you smoke, drink alcohol, or use illegal drugs. Some items may interact with your medicine.  What should I watch for while using this medicine?  This drug may make you  feel generally unwell. Continue your course of treatment even though you feel ill unless your doctor tells you to stop.  You may need blood work done while you are taking this medicine.  Do not become pregnant while taking this medicine or for 3 months after stopping it. Women should inform their doctor if they wish to become pregnant or think they might be pregnant. There is a potential for serious side effects to an unborn child. Talk to your health care professional or pharmacist for more information. Do not breast-feed an infant while taking this medicine or for 3 months after stopping it.  What side effects may I notice from receiving this medicine?  Side effects that you should report to your doctor or health care professional as soon as possible:  -allergic reactions like skin rash, itching or hives, swelling of the face, lips, or tongue  -black, tarry stools  -bloody or watery diarrhea  -breathing problems  -change in emotions or moods  -change in sex drive  -changes in vision  -chest pain or chest tightness  -chills  -confusion  -cough  -facial flushing  -fever  -headache  -signs and symptoms of high blood sugar such as dizziness; dry mouth; dry skin; fruity breath; nausea; stomach pain; increased hunger or thirst; increased urination  -signs and symptoms of liver injury like dark yellow or brown urine; general ill feeling or flu-like symptoms; light-colored stools; loss of appetite; nausea; right upper belly pain;   unusually weak or tired; yellowing of the eyes or skin  -stomach pain  -trouble passing urine or change in the amount of urine  -weight gain or weight loss  Side effects that usually do not require medical attention (report these to your doctor or health care professional if they continue or are bothersome):  -bone pain  -constipation  -loss of appetite  -muscle pain  -nausea  -swelling of the ankles, feet, hands  -tiredness  This list may not describe all possible side effects. Call your doctor  for medical advice about side effects. You may report side effects to FDA at 1-800-FDA-1088.  Where should I keep my medicine?  This drug is given in a hospital or clinic and will not be stored at home.  NOTE: This sheet is a summary. It may not cover all possible information. If you have questions about this medicine, talk to your doctor, pharmacist, or health care provider.  © 2018 [employer] (2015-08-24 15:50:36)

## 2017-06-12 NOTE — Progress Notes (Signed)
Hematology and Oncology Follow Up Visit  Bradley Meadows [id number] 1940/12/02 77 y.o. 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 He is doing well but still feeling fatigued and having dizziness. Thankfully he has not fallen or had a syncopal episode. We will add iron studies to his lab work. He has tried twice to have an MRI of the brain but becomes claustrophobic and cancelled.  He is scheduled for a PET scan next week on Friday.  His SOB with exertion is unchanged. No fever, chills, n/v, cough, rash, chest pain, palpitations, abdominal pain or changes in bowel or bladder habits.  No swelling, tenderness, numbness or tingling in his extremities at this time.  No lymphadenopathy found on exam.  No episodes of bleeding. He does bruise easily on aspirin.  He has a good appetite and is staying well hydrated. His weight is up 2 lbs.   ECOG Performance Status: 1 - Symptomatic but completely ambulatory  Medications:  Allergies as of 06/12/2017      Reactions   No Known Allergies       Medication List        Accurate as of 06/12/17 11:46 AM. Always use your most recent med list.          aspirin EC 81 MG tablet Take 81 mg by mouth daily.   atorvastatin 40 MG tablet Commonly known as:  LIPITOR TAKE 1 TABLET(40 MG) BY MOUTH DAILY   CARAFATE 1 GM/10ML suspension Generic drug:  sucralfate Take 10 mLs (1 g total) by mouth 2 (two) times daily.   HYDROcodone-acetaminophen 5-325 MG tablet Commonly known as:  NORCO/VICODIN Take 1 tablet by mouth every 6 (six) hours as needed for severe pain.   ipratropium-albuterol 0.5-2.5 (3) MG/3ML Soln Commonly known as:  DUONEB USE 1 VIAL  VIA NEBULIZER EVERY 6 HOURS AS NEEDED FOR SHORTNESS OF BREATH OR WHEEZING   Ipratropium-Albuterol 20-100 MCG/ACT Aers respimat Commonly known as:  COMBIVENT RESPIMAT INHALE 1 PUFF BY MOUTH EVERY 6 HOURS AS NEEDED FOR WHEEZING   meclizine 12.5 MG tablet Commonly known as:  ANTIVERT Take 1 tablet (12.5 mg total) by mouth 3 (three) times daily as needed for dizziness.   metoprolol tartrate 25 MG tablet Commonly known as:  LOPRESSOR TAKE 1/2 TABLELET(12.5 MG) BY MOUTH TWICE DAILY   multivitamin tablet Take 1 tablet by mouth daily. Centrum Silver   omeprazole 20 MG capsule Commonly known as:  PRILOSEC Take 1 capsule (20 mg total) by mouth daily.   Tiotropium Bromide-Olodaterol 2.5-2.5 MCG/ACT Aers Commonly known as:  STIOLTO RESPIMAT Inhale 2 puffs into the lungs daily.       Allergies:  Allergies  Allergen Reactions  . No Known Allergies     Past Medical History, Surgical history, Social history, and Family History were reviewed and updated.  Review of Systems: All other 10 point review of systems is negative.   Physical Exam:  vitals were not taken for this visit.   Wt Readings from Last 3 Encounters:  05/29/17 168 lb (76.2 kg)  05/12/17 169 lb (76.7 kg)  05/01/17 169 lb (76.7 kg)    Ocular: Sclerae unicteric, pupils equal,  round and reactive to light Ear-nose-throat: Oropharynx clear, dentition fair Lymphatic: No cervical, supraclavicular or axillary adenopathy Lungs no rales or rhonchi, good excursion bilaterally Heart regular rate and rhythm, no murmur appreciated Abd soft, nontender, positive bowel sounds, no liver or spleen tip palpated on exam, no fluid wave  MSK no focal spinal tenderness, no joint edema Neuro: non-focal, well-oriented, appropriate affect Breasts: Deferred   Lab Results  Component Value Date   WBC 5.3 05/29/2017   HGB 11.7 (L) 05/29/2017   HCT 35.0 (L) 05/29/2017   MCV 95.4 05/29/2017   PLT 147 05/29/2017   Lab Results  Component  Value Date   IRON 66 06/02/2012   TIBC 378 06/02/2012   UIBC 312 06/02/2012   IRONPCTSAT 17 (L) 06/02/2012   Lab Results  Component Value Date   RBC 3.67 (L) 05/29/2017   No results found for: KPAFRELGTCHN, LAMBDASER, KAPLAMBRATIO No results found for: IGGSERUM, IGA, IGMSERUM No results found for: Odetta Pink, SPEI   Chemistry      Component Value Date/Time   NA 136 05/29/2017 1157   NA 133 02/06/2017 0757   NA 129 (L) 01/23/2017 1050   K 3.8 05/29/2017 1157   K 3.9 02/06/2017 0757   K 3.4 (L) 01/23/2017 1050   CL 100 05/29/2017 1157   CL 90 (L) 02/06/2017 0757   CO2 30 05/29/2017 1157   CO2 29 02/06/2017 0757   CO2 26 01/23/2017 1050   BUN 13 05/29/2017 1157   BUN 8 02/06/2017 0757   BUN 12.4 01/23/2017 1050   CREATININE 0.90 05/29/2017 1157   CREATININE 0.9 02/06/2017 0757   CREATININE 0.8 01/23/2017 1050      Component Value Date/Time   CALCIUM 9.3 05/29/2017 1157   CALCIUM 9.3 02/06/2017 0757   CALCIUM 8.9 01/23/2017 1050   ALKPHOS 72 05/29/2017 1157   ALKPHOS 79 02/06/2017 0757   ALKPHOS 80 01/23/2017 1050   AST 19 05/29/2017 1157   AST 39 (H) 01/23/2017 1050   ALT 20 05/29/2017 1157   ALT 43 02/06/2017 0757   ALT 27 01/23/2017 1050   BILITOT 0.7 05/29/2017 1157   BILITOT 0.51 01/23/2017 1050      Impression and Plan: Bradley Meadows is a very pleasant 77 yo caucasian gentleman with locally advanced squamous cell carcinoma of the lung. He initially received chemotherapy and radiation. He is still tolerating treatment with Durvalumab nicely.  He is scheduled for a PET scan next Friday.  He is still having the fatigued and dizziness. We will see what his iron studies look like and replace with IV iron if needed.  We can certainly give him Valium prior to an MRI if needed.  He has his current treatment and appointment schedule and we will see him back for follow-up in 4 weeks.  They will contact our office  with any questions or concerns. We can certainly see him sooner if need be.   Laverna Peace, NP 5/10/201911:46 AM

## 2017-06-15 ENCOUNTER — Other Ambulatory Visit: Payer: Self-pay | Admitting: Family

## 2017-06-15 ENCOUNTER — Telehealth: Payer: Self-pay | Admitting: *Deleted

## 2017-06-15 DIAGNOSIS — D508 Other iron deficiency anemias: Secondary | ICD-10-CM

## 2017-06-15 DIAGNOSIS — D509 Iron deficiency anemia, unspecified: Secondary | ICD-10-CM | POA: Insufficient documentation

## 2017-06-15 LAB — IRON AND TIBC
IRON: 43 ug/dL (ref 42–163)
SATURATION RATIOS: 19 % — AB (ref 42–163)
TIBC: 231 ug/dL (ref 202–409)
UIBC: 188 ug/dL

## 2017-06-15 LAB — FERRITIN: Ferritin: 87 ng/mL (ref 22–316)

## 2017-06-15 LAB — TSH: TSH: 1.082 u[IU]/mL (ref 0.320–4.118)

## 2017-06-15 NOTE — Telephone Encounter (Signed)
Called patients daughter to let her know that patient needs one dose of Iron per Dr. Marin Olp.  [facility] to call us back to schedule

## 2017-06-19 ENCOUNTER — Ambulatory Visit (HOSPITAL_COMMUNITY)
Admission: RE | Admit: 2017-06-19 | Discharge: 2017-06-19 | Disposition: A | Discharge status: Home / Self Care | Payer: Medicare Other | Source: Ambulatory Visit | Attending: Family | Admitting: Family

## 2017-06-19 DIAGNOSIS — C3491 Malignant neoplasm of unspecified part of right bronchus or lung: Secondary | ICD-10-CM | POA: Insufficient documentation

## 2017-06-19 DIAGNOSIS — I2510 Atherosclerotic heart disease of native coronary artery without angina pectoris: Secondary | ICD-10-CM | POA: Insufficient documentation

## 2017-06-19 DIAGNOSIS — I700 Atherosclerosis of aorta: Secondary | ICD-10-CM | POA: Insufficient documentation

## 2017-06-19 DIAGNOSIS — K8020 Calculus of gallbladder without cholecystitis without obstruction: Secondary | ICD-10-CM | POA: Diagnosis not present

## 2017-06-19 DIAGNOSIS — J439 Emphysema, unspecified: Secondary | ICD-10-CM | POA: Diagnosis not present

## 2017-06-19 DIAGNOSIS — C3490 Malignant neoplasm of unspecified part of unspecified bronchus or lung: Secondary | ICD-10-CM | POA: Diagnosis not present

## 2017-06-19 LAB — GLUCOSE, CAPILLARY: GLUCOSE-CAPILLARY: 84 mg/dL (ref 65–99)

## 2017-06-19 MED ORDER — FLUDEOXYGLUCOSE F - 18 (FDG) INJECTION
8.4000 | Freq: Once | INTRAVENOUS | Status: AC | PRN
  Administered 2017-06-19: 8.4 via INTRAVENOUS

## 2017-06-22 ENCOUNTER — Inpatient Hospital Stay: Payer: Medicare Other

## 2017-06-22 VITALS — BP 126/55 | HR 62 | Temp 98.3°F | Resp 20

## 2017-06-22 DIAGNOSIS — Z5112 Encounter for antineoplastic immunotherapy: Secondary | ICD-10-CM | POA: Diagnosis not present

## 2017-06-22 DIAGNOSIS — Z95828 Presence of other vascular implants and grafts: Secondary | ICD-10-CM

## 2017-06-22 DIAGNOSIS — Z79899 Other long term (current) drug therapy: Secondary | ICD-10-CM | POA: Diagnosis not present

## 2017-06-22 DIAGNOSIS — D508 Other iron deficiency anemias: Secondary | ICD-10-CM

## 2017-06-22 DIAGNOSIS — C3491 Malignant neoplasm of unspecified part of right bronchus or lung: Secondary | ICD-10-CM | POA: Diagnosis not present

## 2017-06-22 MED ORDER — SODIUM CHLORIDE 0.9% FLUSH
10.0000 mL | INTRAVENOUS | Status: DC | PRN
  Administered 2017-06-22: 10 mL via INTRAVENOUS
  Filled 2017-06-22: qty 10

## 2017-06-22 MED ORDER — SODIUM CHLORIDE 0.9% FLUSH
10.0000 mL | INTRAVENOUS | Status: DC | PRN
  Filled 2017-06-22: qty 10

## 2017-06-22 MED ORDER — SODIUM CHLORIDE 0.9 % IV SOLN
Freq: Once | INTRAVENOUS | Status: AC
  Administered 2017-06-22: 12:00:00 via INTRAVENOUS

## 2017-06-22 MED ORDER — SODIUM CHLORIDE 0.9% FLUSH
3.0000 mL | Freq: Once | INTRAVENOUS | Status: DC | PRN
  Filled 2017-06-22: qty 10

## 2017-06-22 MED ORDER — SODIUM CHLORIDE 0.9 % IV SOLN
510.0000 mg | Freq: Once | INTRAVENOUS | Status: AC
  Administered 2017-06-22: 510 mg via INTRAVENOUS
  Filled 2017-06-22: qty 17

## 2017-06-22 MED ORDER — HEPARIN SOD (PORK) LOCK FLUSH 100 UNIT/ML IV SOLN
500.0000 [IU] | Freq: Once | INTRAVENOUS | Status: AC | PRN
  Administered 2017-06-22: 500 [IU] via INTRAVENOUS
  Filled 2017-06-22: qty 5

## 2017-06-22 NOTE — Patient Instructions (Signed)

## 2017-06-23 ENCOUNTER — Other Ambulatory Visit: Payer: Self-pay | Admitting: Family

## 2017-06-26 ENCOUNTER — Inpatient Hospital Stay: Payer: Medicare Other

## 2017-06-26 DIAGNOSIS — C3491 Malignant neoplasm of unspecified part of right bronchus or lung: Secondary | ICD-10-CM

## 2017-06-26 DIAGNOSIS — Z79899 Other long term (current) drug therapy: Secondary | ICD-10-CM | POA: Diagnosis not present

## 2017-06-26 DIAGNOSIS — Z5112 Encounter for antineoplastic immunotherapy: Secondary | ICD-10-CM | POA: Diagnosis not present

## 2017-06-26 DIAGNOSIS — D508 Other iron deficiency anemias: Secondary | ICD-10-CM | POA: Diagnosis not present

## 2017-06-26 LAB — CBC WITH DIFFERENTIAL (CANCER CENTER ONLY)
BASOS ABS: 0 10*3/uL (ref 0.0–0.1)
Basophils Relative: 1 %
Eosinophils Absolute: 0.5 10*3/uL (ref 0.0–0.5)
Eosinophils Relative: 7 %
HEMATOCRIT: 36.3 % — AB (ref 38.7–49.9)
HEMOGLOBIN: 12.1 g/dL — AB (ref 13.0–17.1)
LYMPHS PCT: 16 %
Lymphs Abs: 1 10*3/uL (ref 0.9–3.3)
MCH: 31.1 pg (ref 28.0–33.4)
MCHC: 33.3 g/dL (ref 32.0–35.9)
MCV: 93.3 fL (ref 82.0–98.0)
Monocytes Absolute: 0.7 10*3/uL (ref 0.1–0.9)
Monocytes Relative: 11 %
NEUTROS ABS: 4 10*3/uL (ref 1.5–6.5)
NEUTROS PCT: 65 %
Platelet Count: 141 10*3/uL — ABNORMAL LOW (ref 145–400)
RBC: 3.89 MIL/uL — ABNORMAL LOW (ref 4.20–5.70)
RDW: 12.8 % (ref 11.1–15.7)
WBC: 6.2 10*3/uL (ref 4.0–10.0)

## 2017-06-26 LAB — CMP (CANCER CENTER ONLY)
ALT: 16 U/L (ref 10–47)
ANION GAP: 7 (ref 5–15)
AST: 19 U/L (ref 11–38)
Albumin: 3.5 g/dL (ref 3.5–5.0)
Alkaline Phosphatase: 82 U/L (ref 26–84)
BILIRUBIN TOTAL: 0.6 mg/dL (ref 0.2–1.6)
BUN: 11 mg/dL (ref 7–22)
CALCIUM: 9.4 mg/dL (ref 8.0–10.3)
CO2: 29 mmol/L (ref 18–33)
Chloride: 99 mmol/L (ref 98–108)
Creatinine: 0.8 mg/dL (ref 0.60–1.20)
Glucose, Bld: 116 mg/dL (ref 73–118)
Potassium: 3.8 mmol/L (ref 3.3–4.7)
Sodium: 135 mmol/L (ref 128–145)
TOTAL PROTEIN: 6.1 g/dL — AB (ref 6.4–8.1)

## 2017-06-26 MED ORDER — SODIUM CHLORIDE 0.9% FLUSH
10.0000 mL | INTRAVENOUS | Status: DC | PRN
  Administered 2017-06-26: 10 mL
  Filled 2017-06-26: qty 10

## 2017-06-26 MED ORDER — HEPARIN SOD (PORK) LOCK FLUSH 100 UNIT/ML IV SOLN
500.0000 [IU] | Freq: Once | INTRAVENOUS | Status: AC | PRN
  Administered 2017-06-26: 500 [IU]
  Filled 2017-06-26: qty 5

## 2017-06-26 MED ORDER — SODIUM CHLORIDE 0.9 % IV SOLN
740.0000 mg | Freq: Once | INTRAVENOUS | Status: AC
  Administered 2017-06-26: 740 mg via INTRAVENOUS
  Filled 2017-06-26: qty 4.8

## 2017-06-26 MED ORDER — SODIUM CHLORIDE 0.9 % IV SOLN
Freq: Once | INTRAVENOUS | Status: AC
  Administered 2017-06-26: 10:00:00 via INTRAVENOUS

## 2017-06-26 NOTE — Patient Instructions (Signed)
Durvalumab injection  What is this medicine?  DURVALUMAB (dur VAL ue mab) is a monoclonal antibody. It is used to treat urothelial cancer.  This medicine may be used for other purposes; ask your health care provider or pharmacist if you have questions.  COMMON BRAND NAME(S): IMFINZI  What should I tell my health care provider before I take this medicine?  They need to know if you have any of these conditions:  -diabetes  -immune system problems  -infection  -inflammatory bowel disease  -kidney disease  -liver disease  -lung or breathing disease  -lupus  -organ transplant  -stomach or intestine problems  -thyroid disease  -an unusual or allergic reaction to durvalumab, other medicines, foods, dyes, or preservatives  -pregnant or trying to get pregnant  -breast-feeding  How should I use this medicine?  This medicine is for infusion into a vein. It is given by a health care professional in a hospital or clinic setting.  A special MedGuide will be given to you before each treatment. Be sure to read this information carefully each time.  Talk to your pediatrician regarding the use of this medicine in children. Special care may be needed.  Overdosage: If you think you have taken too much of this medicine contact a poison control center or emergency room at once.  NOTE: This medicine is only for you. Do not share this medicine with others.  What if I miss a dose?  It is important not to miss your dose. Call your doctor or health care professional if you are unable to keep an appointment.  What may interact with this medicine?  Interactions have not been studied.  This list may not describe all possible interactions. Give your health care provider a list of all the medicines, herbs, non-prescription drugs, or dietary supplements you use. Also tell them if you smoke, drink alcohol, or use illegal drugs. Some items may interact with your medicine.  What should I watch for while using this medicine?  This drug may make you  feel generally unwell. Continue your course of treatment even though you feel ill unless your doctor tells you to stop.  You may need blood work done while you are taking this medicine.  Do not become pregnant while taking this medicine or for 3 months after stopping it. Women should inform their doctor if they wish to become pregnant or think they might be pregnant. There is a potential for serious side effects to an unborn child. Talk to your health care professional or pharmacist for more information. Do not breast-feed an infant while taking this medicine or for 3 months after stopping it.  What side effects may I notice from receiving this medicine?  Side effects that you should report to your doctor or health care professional as soon as possible:  -allergic reactions like skin rash, itching or hives, swelling of the face, lips, or tongue  -black, tarry stools  -bloody or watery diarrhea  -breathing problems  -change in emotions or moods  -change in sex drive  -changes in vision  -chest pain or chest tightness  -chills  -confusion  -cough  -facial flushing  -fever  -headache  -signs and symptoms of high blood sugar such as dizziness; dry mouth; dry skin; fruity breath; nausea; stomach pain; increased hunger or thirst; increased urination  -signs and symptoms of liver injury like dark yellow or brown urine; general ill feeling or flu-like symptoms; light-colored stools; loss of appetite; nausea; right upper belly pain;   unusually weak or tired; yellowing of the eyes or skin  -stomach pain  -trouble passing urine or change in the amount of urine  -weight gain or weight loss  Side effects that usually do not require medical attention (report these to your doctor or health care professional if they continue or are bothersome):  -bone pain  -constipation  -loss of appetite  -muscle pain  -nausea  -swelling of the ankles, feet, hands  -tiredness  This list may not describe all possible side effects. Call your doctor  for medical advice about side effects. You may report side effects to FDA at 1-800-FDA-1088.  Where should I keep my medicine?  This drug is given in a hospital or clinic and will not be stored at home.  NOTE: This sheet is a summary. It may not cover all possible information. If you have questions about this medicine, talk to your doctor, pharmacist, or health care provider.  © 2018 [employer] (2015-08-24 15:50:36)

## 2017-06-26 NOTE — Patient Instructions (Signed)
Implanted Port Home Guide An implanted port is a type of central line that is placed under the skin. Central lines are used to provide IV access when treatment or nutrition needs to be given through a person's veins. Implanted ports are used for long-term IV access. An implanted port may be placed because:  You need IV medicine that would be irritating to the small veins in your hands or arms.  You need long-term IV medicines, such as antibiotics.  You need IV nutrition for a long period.  You need frequent blood draws for lab tests.  You need dialysis.  Implanted ports are usually placed in the chest area, but they can also be placed in the upper arm, the abdomen, or the leg. An implanted port has two main parts:  Reservoir. The reservoir is round and will appear as a small, raised area under your skin. The reservoir is the part where a needle is inserted to give medicines or draw blood.  Catheter. The catheter is a thin, flexible tube that extends from the reservoir. The catheter is placed into a large vein. Medicine that is inserted into the reservoir goes into the catheter and then into the vein.  How will I care for my incision site? Do not get the incision site wet. Bathe or shower as directed by your health care provider. How is my port accessed? Special steps must be taken to access the port:  Before the port is accessed, a numbing cream can be placed on the skin. This helps numb the skin over the port site.  Your health care provider uses a sterile technique to access the port. ? Your health care provider must put on a mask and sterile gloves. ? The skin over your port is cleaned carefully with an antiseptic and allowed to dry. ? The port is gently pinched between sterile gloves, and a needle is inserted into the port.  Only "non-coring" port needles should be used to access the port. Once the port is accessed, a blood return should be checked. This helps ensure that the port  is in the vein and is not clogged.  If your port needs to remain accessed for a constant infusion, a clear (transparent) bandage will be placed over the needle site. The bandage and needle will need to be changed every week, or as directed by your health care provider.  Keep the bandage covering the needle clean and dry. Do not get it wet. Follow your health care provider's instructions on how to take a shower or bath while the port is accessed.  If your port does not need to stay accessed, no bandage is needed over the port.  What is flushing? Flushing helps keep the port from getting clogged. Follow your health care provider's instructions on how and when to flush the port. Ports are usually flushed with saline solution or a medicine called heparin. The need for flushing will depend on how the port is used.  If the port is used for intermittent medicines or blood draws, the port will need to be flushed: ? After medicines have been given. ? After blood has been drawn. ? As part of routine maintenance.  If a constant infusion is running, the port may not need to be flushed.  How long will my port stay implanted? The port can stay in for as long as your health care provider thinks it is needed. When it is time for the port to come out, surgery will be   done to remove it. The procedure is similar to the one performed when the port was put in. When should I seek immediate medical care? When you have an implanted port, you should seek immediate medical care if:  You notice a bad smell coming from the incision site.  You have swelling, redness, or drainage at the incision site.  You have more swelling or pain at the port site or the surrounding area.  You have a fever that is not controlled with medicine.  This information is not intended to replace advice given to you by your health care provider. Make sure you discuss any questions you have with your health care provider. Document  Released: 01/20/2005 Document Revised: 06/28/2015 Document Reviewed: 09/27/2012 Elsevier Interactive Patient Education  2017 Elsevier Inc.  

## 2017-06-26 NOTE — Progress Notes (Signed)
Patient reports numbness in bilateral hands with no additional neurological symptoms. Pt continues to report dizziness "all the time." Labs reviewed with Dr Marin Olp. Per vov Dr Marin Olp, pt to begin vitamin B12 1064mcg daily to see is sx improve. dph

## 2017-07-10 ENCOUNTER — Inpatient Hospital Stay: Payer: Medicare Other

## 2017-07-10 ENCOUNTER — Other Ambulatory Visit: Payer: Self-pay | Admitting: *Deleted

## 2017-07-10 ENCOUNTER — Other Ambulatory Visit: Payer: Self-pay | Admitting: Family Medicine

## 2017-07-10 ENCOUNTER — Inpatient Hospital Stay: Payer: Medicare Other | Attending: Hematology & Oncology | Admitting: Hematology & Oncology

## 2017-07-10 ENCOUNTER — Other Ambulatory Visit: Payer: Self-pay

## 2017-07-10 ENCOUNTER — Encounter: Payer: Self-pay | Admitting: Hematology & Oncology

## 2017-07-10 VITALS — BP 119/62 | HR 85 | Temp 99.2°F | Resp 20 | Wt 170.0 lb

## 2017-07-10 DIAGNOSIS — C3491 Malignant neoplasm of unspecified part of right bronchus or lung: Secondary | ICD-10-CM

## 2017-07-10 DIAGNOSIS — E032 Hypothyroidism due to medicaments and other exogenous substances: Secondary | ICD-10-CM

## 2017-07-10 DIAGNOSIS — R42 Dizziness and giddiness: Secondary | ICD-10-CM | POA: Diagnosis not present

## 2017-07-10 DIAGNOSIS — E559 Vitamin D deficiency, unspecified: Secondary | ICD-10-CM

## 2017-07-10 DIAGNOSIS — E611 Iron deficiency: Secondary | ICD-10-CM

## 2017-07-10 DIAGNOSIS — K4030 Unilateral inguinal hernia, with obstruction, without gangrene, not specified as recurrent: Secondary | ICD-10-CM

## 2017-07-10 DIAGNOSIS — R200 Anesthesia of skin: Secondary | ICD-10-CM | POA: Diagnosis not present

## 2017-07-10 DIAGNOSIS — D508 Other iron deficiency anemias: Secondary | ICD-10-CM

## 2017-07-10 DIAGNOSIS — Z5112 Encounter for antineoplastic immunotherapy: Secondary | ICD-10-CM | POA: Diagnosis present

## 2017-07-10 DIAGNOSIS — I2510 Atherosclerotic heart disease of native coronary artery without angina pectoris: Secondary | ICD-10-CM

## 2017-07-10 LAB — CMP (CANCER CENTER ONLY)
ALT: 14 U/L (ref 10–47)
ANION GAP: 6 (ref 5–15)
AST: 16 U/L (ref 11–38)
Albumin: 3.4 g/dL — ABNORMAL LOW (ref 3.5–5.0)
Alkaline Phosphatase: 69 U/L (ref 26–84)
BUN: 11 mg/dL (ref 7–22)
CO2: 29 mmol/L (ref 18–33)
Calcium: 8.9 mg/dL (ref 8.0–10.3)
Chloride: 100 mmol/L (ref 98–108)
Creatinine: 0.7 mg/dL (ref 0.60–1.20)
GLUCOSE: 61 mg/dL — AB (ref 73–118)
POTASSIUM: 4 mmol/L (ref 3.3–4.7)
SODIUM: 135 mmol/L (ref 128–145)
TOTAL PROTEIN: 5.9 g/dL — AB (ref 6.4–8.1)
Total Bilirubin: 0.7 mg/dL (ref 0.2–1.6)

## 2017-07-10 LAB — CBC WITH DIFFERENTIAL (CANCER CENTER ONLY)
Basophils Absolute: 0 10*3/uL (ref 0.0–0.1)
Basophils Relative: 1 %
EOS PCT: 6 %
Eosinophils Absolute: 0.4 10*3/uL (ref 0.0–0.5)
HCT: 36.1 % — ABNORMAL LOW (ref 38.7–49.9)
Hemoglobin: 12.2 g/dL — ABNORMAL LOW (ref 13.0–17.1)
LYMPHS ABS: 0.9 10*3/uL (ref 0.9–3.3)
LYMPHS PCT: 15 %
MCH: 31.5 pg (ref 28.0–33.4)
MCHC: 33.8 g/dL (ref 32.0–35.9)
MCV: 93.3 fL (ref 82.0–98.0)
MONO ABS: 0.6 10*3/uL (ref 0.1–0.9)
Monocytes Relative: 10 %
Neutro Abs: 4.1 10*3/uL (ref 1.5–6.5)
Neutrophils Relative %: 68 %
PLATELETS: 145 10*3/uL (ref 145–400)
RBC: 3.87 MIL/uL — AB (ref 4.20–5.70)
RDW: 13.3 % (ref 11.1–15.7)
WBC: 6 10*3/uL (ref 4.0–10.0)

## 2017-07-10 LAB — LACTATE DEHYDROGENASE: LDH: 161 U/L (ref 125–245)

## 2017-07-10 MED ORDER — SODIUM CHLORIDE 0.9 % IV SOLN: Freq: Once | INTRAVENOUS | Status: DC

## 2017-07-10 MED ORDER — VITAMIN B-6 250 MG PO TABS: 250.0000 mg | ORAL_TABLET | Freq: Every day | ORAL | 6 refills | Status: DC

## 2017-07-10 MED ORDER — SODIUM CHLORIDE 0.9 % IV SOLN
9.0000 mg/kg | Freq: Once | INTRAVENOUS | Status: DC
  Filled 2017-07-10: qty 14.8

## 2017-07-10 MED ORDER — HYDROCODONE-ACETAMINOPHEN 5-325 MG PO TABS: 1.0000 | ORAL_TABLET | Freq: Four times a day (QID) | ORAL | 0 refills | Status: DC | PRN

## 2017-07-10 MED ORDER — SODIUM CHLORIDE 0.9% FLUSH
10.0000 mL | INTRAVENOUS | Status: DC | PRN
  Administered 2017-07-10: 10 mL
  Filled 2017-07-10: qty 10

## 2017-07-10 MED ORDER — HEPARIN SOD (PORK) LOCK FLUSH 100 UNIT/ML IV SOLN
500.0000 [IU] | Freq: Once | INTRAVENOUS | Status: AC | PRN
  Administered 2017-07-10: 500 [IU]
  Filled 2017-07-10: qty 5

## 2017-07-10 NOTE — Progress Notes (Signed)
Hematology and Oncology Follow Up Visit  Bradley Meadows [id number] 31-Aug-1940 77 y.o. 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  He is not feeling well at all.  His daughter says that he just lays around the house all day long.  He now has numbness in both arms from his elbows down to his fingers.  I am not sure why he has this.  He is eating a little bit better.  He has had no problems with bowels or bladder.  He has had no cough.  It is very disappointing that he is feeling so poorly.  I will go ahead and stop the Durvalumab.  I just think that this is the only possible etiology for his current symptomatic state.  I know that immunotherapy can definitely cause issues.  There is no weakness in his hands.  He is dizzy but this is chronic.  There is no neuropathy or tingling in his legs.  I know that he did receive cis-platinum based chemotherapy initially.  However, this was really not full dose.  Again, he does not have any neuropathic type symptoms in his legs.  I will call in some vitamin B6 (250 mg p.o. daily) and we will see if this helps.  His last PET scan showed that he was in remission.  I am going to get an echocardiogram on him.  I want to make sure that his echocardiogram is doing okay and that his heart function is not compromised.  I just hate that we have to stop his treatment right now.  Currently, his performance status is ECOG 2.   Medications:  Allergies as of 07/10/2017      Reactions   No Known Allergies       Medication List        Accurate as of 07/10/17 12:44 PM. Always use your most recent med list.            aspirin EC 81 MG tablet Take 81 mg by mouth daily.   atorvastatin 40 MG tablet Commonly known as:  LIPITOR TAKE 1 TABLET(40 MG) BY MOUTH DAILY   CARAFATE 1 GM/10ML suspension Generic drug:  sucralfate Take 10 mLs (1 g total) by mouth 2 (two) times daily.   HYDROcodone-acetaminophen 5-325 MG tablet Commonly known as:  NORCO/VICODIN Take 1 tablet by mouth every 6 (six) hours as needed for severe pain.   ipratropium-albuterol 0.5-2.5 (3) MG/3ML Soln Commonly known as:  DUONEB USE 1 VIAL VIA NEBULIZER EVERY 6 HOURS AS NEEDED FOR SHORTNESS OF BREATH OR WHEEZING   Ipratropium-Albuterol 20-100 MCG/ACT Aers respimat Commonly known as:  COMBIVENT RESPIMAT INHALE 1 PUFF BY MOUTH EVERY 6 HOURS AS NEEDED FOR WHEEZING   meclizine 12.5 MG tablet Commonly known as:  ANTIVERT Take 1 tablet (12.5 mg total) by mouth 3 (three) times daily as needed for dizziness.   metoprolol tartrate 25 MG tablet Commonly known as:  LOPRESSOR TAKE 1/2 TABLELET(12.5 MG) BY MOUTH TWICE DAILY   multivitamin tablet Take 1 tablet by mouth daily. Centrum Silver   omeprazole 20 MG capsule Commonly known as:  PRILOSEC Take 1 capsule (  20 mg total) by mouth daily.   Tiotropium Bromide-Olodaterol 2.5-2.5 MCG/ACT Aers Commonly known as:  STIOLTO RESPIMAT Inhale 2 puffs into the lungs daily.       Allergies:  Allergies  Allergen Reactions  . No Known Allergies     Past Medical History, Surgical history, Social history, and Family History were reviewed and updated.  Review of Systems: Review of Systems  Constitutional: Positive for malaise/fatigue.  HENT: Negative.   Eyes: Negative.   Respiratory: Positive for cough and shortness of breath.   Cardiovascular: Positive for chest pain and leg swelling.  Gastrointestinal: Positive for abdominal pain and constipation.  Genitourinary: Positive for dysuria and hematuria.  Musculoskeletal: Positive for joint pain and neck pain.  Skin: Negative.    Neurological: Positive for dizziness, tingling and focal weakness.  Endo/Heme/Allergies: Negative.   Psychiatric/Behavioral: Negative.       Physical Exam:  weight is 170 lb (77.1 kg). His oral temperature is 99.2 F (37.3 C). His blood pressure is 119/62 and his pulse is 85. His respiration is 20 and oxygen saturation is 100%.   Wt Readings from Last 3 Encounters:  07/10/17 170 lb (77.1 kg)  06/12/17 170 lb 12 oz (77.5 kg)  05/29/17 168 lb (76.2 kg)    Physical Exam  Constitutional: He is oriented to person, place, and time.  HENT:  Head: Normocephalic and atraumatic.  Mouth/Throat: Oropharynx is clear and moist.  Eyes: Pupils are equal, round, and reactive to light. EOM are normal.  Neck: Normal range of motion.  Cardiovascular: Normal rate, regular rhythm and normal heart sounds.  Pulmonary/Chest: Effort normal and breath sounds normal.  Abdominal: Soft. Bowel sounds are normal.  Musculoskeletal: Normal range of motion. He exhibits no edema, tenderness or deformity.  Lymphadenopathy:    He has no cervical adenopathy.  Neurological: He is alert and oriented to person, place, and time.  Skin: Skin is warm and dry. No rash noted. No erythema.  Psychiatric: He has a normal mood and affect. His behavior is normal. Judgment and thought content normal.  Vitals reviewed.    Lab Results  Component Value Date   WBC 6.0 07/10/2017   HGB 12.2 (L) 07/10/2017   HCT 36.1 (L) 07/10/2017   MCV 93.3 07/10/2017   PLT 145 07/10/2017   Lab Results  Component Value Date   FERRITIN 87 06/12/2017   IRON 43 06/12/2017   TIBC 231 06/12/2017   UIBC 188 06/12/2017   IRONPCTSAT 19 (L) 06/12/2017   Lab Results  Component Value Date   RBC 3.87 (L) 07/10/2017   No results found for: KPAFRELGTCHN, LAMBDASER, KAPLAMBRATIO No results found for: IGGSERUM, IGA, IGMSERUM No results found for: Odetta Pink, SPEI   Chemistry       Component Value Date/Time   NA 135 07/10/2017 1140   NA 133 02/06/2017 0757   NA 129 (L) 01/23/2017 1050   K 4.0 07/10/2017 1140   K 3.9 02/06/2017 0757   K 3.4 (L) 01/23/2017 1050   CL 100 07/10/2017 1140   CL 90 (L) 02/06/2017 0757   CO2 29 07/10/2017 1140   CO2 29 02/06/2017 0757   CO2 26 01/23/2017 1050   BUN 11 07/10/2017 1140   BUN 8 02/06/2017 0757   BUN 12.4 01/23/2017 1050   CREATININE 0.70 07/10/2017 1140   CREATININE 0.9 02/06/2017 0757   CREATININE 0.8 01/23/2017 1050      Component Value Date/Time   CALCIUM 8.9 07/10/2017 1140  CALCIUM 9.3 02/06/2017 0757   CALCIUM 8.9 01/23/2017 1050   ALKPHOS 69 07/10/2017 1140   ALKPHOS 79 02/06/2017 0757   ALKPHOS 80 01/23/2017 1050   AST 16 07/10/2017 1140   AST 39 (H) 01/23/2017 1050   ALT 14 07/10/2017 1140   ALT 43 02/06/2017 0757   ALT 27 01/23/2017 1050   BILITOT 0.7 07/10/2017 1140   BILITOT 0.51 01/23/2017 1050      Impression and Plan: Bradley Meadows is a very pleasant 76 yo caucasian gentleman with locally advanced squamous cell carcinoma of the lung. He initially received chemotherapy and radiation.  We then used Durvalumab according to the PACIFIC clinical trial.  Again, I am not sure why he feels so poorly.  I just have a hard time believing that this is the Durvalumab but I cannot think of anything else.  He had his thyroid tested back in May and that his TSH was okay.  Gave him iron because of iron deficiency.  This is not helped.  I spent about 45 minutes with he and his daughter.  All the time was spent counseling those to as to what might be going on and how we can help him.  They understand it.  We will get him back in 1 month.  I would do a PET scan in August.  He does have a significant risk of recurrence.     Volanda Napoleon, MD 6/7/201912:44 PM

## 2017-07-10 NOTE — Telephone Encounter (Signed)
Patient is requesting a refill on Hydrocodone   LOV: 05/12/17  LRF:   06/09/17

## 2017-07-11 LAB — VITAMIN D 25 HYDROXY (VIT D DEFICIENCY, FRACTURES): VIT D 25 HYDROXY: 34 ng/mL (ref 30.0–100.0)

## 2017-07-13 LAB — IRON AND TIBC
Iron: 57 ug/dL (ref 42–163)
SATURATION RATIOS: 26 % — AB (ref 42–163)
TIBC: 223 ug/dL (ref 202–409)
UIBC: 166 ug/dL

## 2017-07-13 LAB — FERRITIN: Ferritin: 240 ng/mL (ref 22–316)

## 2017-07-13 LAB — TSH: TSH: 1.649 u[IU]/mL (ref 0.320–4.118)

## 2017-07-14 ENCOUNTER — Other Ambulatory Visit (HOSPITAL_COMMUNITY): Payer: Medicare Other

## 2017-07-17 ENCOUNTER — Other Ambulatory Visit (HOSPITAL_COMMUNITY): Payer: Medicare Other

## 2017-07-24 ENCOUNTER — Ambulatory Visit (HOSPITAL_COMMUNITY)
Admission: RE | Admit: 2017-07-24 | Discharge: 2017-07-24 | Disposition: A | Discharge status: Home / Self Care | Payer: Medicare Other | Source: Ambulatory Visit | Attending: Hematology & Oncology | Admitting: Hematology & Oncology

## 2017-07-24 DIAGNOSIS — I110 Hypertensive heart disease with heart failure: Secondary | ICD-10-CM | POA: Insufficient documentation

## 2017-07-24 DIAGNOSIS — K219 Gastro-esophageal reflux disease without esophagitis: Secondary | ICD-10-CM | POA: Diagnosis not present

## 2017-07-24 DIAGNOSIS — E119 Type 2 diabetes mellitus without complications: Secondary | ICD-10-CM | POA: Diagnosis not present

## 2017-07-24 DIAGNOSIS — I509 Heart failure, unspecified: Secondary | ICD-10-CM | POA: Insufficient documentation

## 2017-07-24 DIAGNOSIS — Z85118 Personal history of other malignant neoplasm of bronchus and lung: Secondary | ICD-10-CM | POA: Insufficient documentation

## 2017-07-24 DIAGNOSIS — J449 Chronic obstructive pulmonary disease, unspecified: Secondary | ICD-10-CM | POA: Diagnosis not present

## 2017-07-24 DIAGNOSIS — I2510 Atherosclerotic heart disease of native coronary artery without angina pectoris: Secondary | ICD-10-CM | POA: Diagnosis not present

## 2017-07-24 NOTE — Progress Notes (Signed)
  Echocardiogram 2D Echocardiogram has been performed.  Bradley Meadows G Rabon Scholle 07/24/2017, 12:01 PM

## 2017-08-03 ENCOUNTER — Telehealth: Payer: Self-pay | Admitting: *Deleted

## 2017-08-03 NOTE — Telephone Encounter (Signed)
Received call from patients daughter Bradley Meadows asking about the use of Gabapentin for her fathers peripheral neuropathy.  Told her that Dr. Marin Olp is out of town this week but we will ask him when he comes back.  Patient currently on B6 for neuropathy.

## 2017-08-10 ENCOUNTER — Other Ambulatory Visit: Payer: Self-pay | Admitting: Family Medicine

## 2017-08-10 ENCOUNTER — Other Ambulatory Visit: Payer: Self-pay | Admitting: *Deleted

## 2017-08-10 DIAGNOSIS — K4030 Unilateral inguinal hernia, with obstruction, without gangrene, not specified as recurrent: Secondary | ICD-10-CM

## 2017-08-10 DIAGNOSIS — G609 Hereditary and idiopathic neuropathy, unspecified: Secondary | ICD-10-CM

## 2017-08-10 MED ORDER — GABAPENTIN 300 MG PO CAPS: 300.0000 mg | ORAL_CAPSULE | Freq: Three times a day (TID) | ORAL | 2 refills | Status: DC

## 2017-08-10 NOTE — Telephone Encounter (Signed)
Patient is requesting a refill on Hydrocodone   LOV: 05/12/17  LRF:   07/10/17

## 2017-08-10 NOTE — Telephone Encounter (Signed)
Patient is calling to get refill on his hydrocodone  To be sent to [facility] on [address]  [phone]

## 2017-08-11 MED ORDER — HYDROCODONE-ACETAMINOPHEN 5-325 MG PO TABS: 1.0000 | ORAL_TABLET | Freq: Four times a day (QID) | ORAL | 0 refills | Status: DC | PRN

## 2017-08-14 ENCOUNTER — Other Ambulatory Visit: Payer: Medicare Other

## 2017-08-14 ENCOUNTER — Ambulatory Visit: Payer: Medicare Other | Admitting: Hematology & Oncology

## 2017-08-20 ENCOUNTER — Other Ambulatory Visit: Payer: Self-pay

## 2017-08-20 DIAGNOSIS — C3491 Malignant neoplasm of unspecified part of right bronchus or lung: Secondary | ICD-10-CM

## 2017-08-20 DIAGNOSIS — E559 Vitamin D deficiency, unspecified: Secondary | ICD-10-CM

## 2017-08-21 ENCOUNTER — Encounter: Payer: Self-pay | Admitting: Hematology & Oncology

## 2017-08-21 ENCOUNTER — Inpatient Hospital Stay: Payer: Medicare Other

## 2017-08-21 ENCOUNTER — Other Ambulatory Visit: Payer: Self-pay

## 2017-08-21 ENCOUNTER — Inpatient Hospital Stay: Payer: Medicare Other | Attending: Hematology & Oncology

## 2017-08-21 ENCOUNTER — Ambulatory Visit (HOSPITAL_BASED_OUTPATIENT_CLINIC_OR_DEPARTMENT_OTHER)
Admission: RE | Admit: 2017-08-21 | Discharge: 2017-08-21 | Disposition: A | Discharge status: Home / Self Care | Payer: Medicare Other | Source: Ambulatory Visit | Attending: Hematology & Oncology | Admitting: Hematology & Oncology

## 2017-08-21 ENCOUNTER — Inpatient Hospital Stay (HOSPITAL_BASED_OUTPATIENT_CLINIC_OR_DEPARTMENT_OTHER): Payer: Medicare Other | Admitting: Hematology & Oncology

## 2017-08-21 VITALS — BP 123/61 | HR 66 | Temp 98.3°F | Resp 19 | Wt 163.0 lb

## 2017-08-21 DIAGNOSIS — C3491 Malignant neoplasm of unspecified part of right bronchus or lung: Secondary | ICD-10-CM

## 2017-08-21 DIAGNOSIS — D509 Iron deficiency anemia, unspecified: Secondary | ICD-10-CM | POA: Diagnosis not present

## 2017-08-21 DIAGNOSIS — R937 Abnormal findings on diagnostic imaging of other parts of musculoskeletal system: Secondary | ICD-10-CM | POA: Insufficient documentation

## 2017-08-21 DIAGNOSIS — M85842 Other specified disorders of bone density and structure, left hand: Secondary | ICD-10-CM | POA: Diagnosis not present

## 2017-08-21 DIAGNOSIS — M25649 Stiffness of unspecified hand, not elsewhere classified: Secondary | ICD-10-CM

## 2017-08-21 DIAGNOSIS — D508 Other iron deficiency anemias: Secondary | ICD-10-CM

## 2017-08-21 DIAGNOSIS — M795 Residual foreign body in soft tissue: Secondary | ICD-10-CM | POA: Insufficient documentation

## 2017-08-21 DIAGNOSIS — M79642 Pain in left hand: Secondary | ICD-10-CM | POA: Insufficient documentation

## 2017-08-21 DIAGNOSIS — Z95828 Presence of other vascular implants and grafts: Secondary | ICD-10-CM

## 2017-08-21 DIAGNOSIS — M79641 Pain in right hand: Secondary | ICD-10-CM | POA: Diagnosis not present

## 2017-08-21 DIAGNOSIS — G629 Polyneuropathy, unspecified: Secondary | ICD-10-CM | POA: Diagnosis not present

## 2017-08-21 DIAGNOSIS — E559 Vitamin D deficiency, unspecified: Secondary | ICD-10-CM

## 2017-08-21 DIAGNOSIS — D500 Iron deficiency anemia secondary to blood loss (chronic): Secondary | ICD-10-CM

## 2017-08-21 DIAGNOSIS — I429 Cardiomyopathy, unspecified: Secondary | ICD-10-CM

## 2017-08-21 LAB — CMP (CANCER CENTER ONLY)
ALBUMIN: 3.7 g/dL (ref 3.5–5.0)
ALK PHOS: 65 U/L (ref 26–84)
ALT: 15 U/L (ref 10–47)
AST: 21 U/L (ref 11–38)
Anion gap: 5 (ref 5–15)
BUN: 6 mg/dL — AB (ref 7–22)
CALCIUM: 9.2 mg/dL (ref 8.0–10.3)
CHLORIDE: 97 mmol/L — AB (ref 98–108)
CO2: 32 mmol/L (ref 18–33)
CREATININE: 0.6 mg/dL (ref 0.60–1.20)
Glucose, Bld: 125 mg/dL — ABNORMAL HIGH (ref 73–118)
Potassium: 3.9 mmol/L (ref 3.3–4.7)
SODIUM: 134 mmol/L (ref 128–145)
TOTAL PROTEIN: 6.1 g/dL — AB (ref 6.4–8.1)
Total Bilirubin: 0.9 mg/dL (ref 0.2–1.6)

## 2017-08-21 LAB — CBC WITH DIFFERENTIAL (CANCER CENTER ONLY)
BASOS PCT: 0 %
Basophils Absolute: 0 10*3/uL (ref 0.0–0.1)
EOS ABS: 0.3 10*3/uL (ref 0.0–0.5)
Eosinophils Relative: 4 %
HCT: 35.9 % — ABNORMAL LOW (ref 38.7–49.9)
HEMOGLOBIN: 12.1 g/dL — AB (ref 13.0–17.1)
LYMPHS ABS: 0.8 10*3/uL — AB (ref 0.9–3.3)
Lymphocytes Relative: 13 %
MCH: 31.3 pg (ref 28.0–33.4)
MCHC: 33.7 g/dL (ref 32.0–35.9)
MCV: 92.8 fL (ref 82.0–98.0)
MONOS PCT: 9 %
Monocytes Absolute: 0.6 10*3/uL (ref 0.1–0.9)
NEUTROS PCT: 74 %
Neutro Abs: 4.5 10*3/uL (ref 1.5–6.5)
Platelet Count: 159 10*3/uL (ref 145–400)
RBC: 3.87 MIL/uL — ABNORMAL LOW (ref 4.20–5.70)
RDW: 13.8 % (ref 11.1–15.7)
WBC: 6.1 10*3/uL (ref 4.0–10.0)

## 2017-08-21 LAB — LACTATE DEHYDROGENASE: LDH: 173 U/L (ref 98–192)

## 2017-08-21 MED ORDER — SODIUM CHLORIDE 0.9 % IV SOLN
510.0000 mg | Freq: Once | INTRAVENOUS | Status: AC
  Administered 2017-08-21: 510 mg via INTRAVENOUS
  Filled 2017-08-21: qty 17

## 2017-08-21 MED ORDER — KETOROLAC TROMETHAMINE 15 MG/ML IJ SOLN
30.0000 mg | Freq: Once | INTRAMUSCULAR | Status: AC
  Administered 2017-08-21: 30 mg via INTRAVENOUS

## 2017-08-21 MED ORDER — TRAMADOL HCL 50 MG PO TABS: 50.0000 mg | ORAL_TABLET | Freq: Four times a day (QID) | ORAL | 0 refills | Status: DC | PRN

## 2017-08-21 MED ORDER — KETOROLAC TROMETHAMINE 15 MG/ML IJ SOLN
INTRAMUSCULAR | Status: AC
  Filled 2017-08-21: qty 2

## 2017-08-21 MED ORDER — SODIUM CHLORIDE 0.9 % IV SOLN
Freq: Once | INTRAVENOUS | Status: AC
  Administered 2017-08-21: 11:00:00 via INTRAVENOUS

## 2017-08-21 MED ORDER — HEPARIN SOD (PORK) LOCK FLUSH 100 UNIT/ML IV SOLN
500.0000 [IU] | Freq: Once | INTRAVENOUS | Status: AC | PRN
  Administered 2017-08-21: 500 [IU] via INTRAVENOUS
  Filled 2017-08-21: qty 5

## 2017-08-21 MED ORDER — SODIUM CHLORIDE 0.9% FLUSH
10.0000 mL | INTRAVENOUS | Status: DC | PRN
  Administered 2017-08-21: 10 mL via INTRAVENOUS
  Filled 2017-08-21: qty 10

## 2017-08-21 NOTE — Patient Instructions (Signed)
Implanted Port Insertion, Care After °This sheet gives you information about how to care for yourself after your procedure. Your health care provider may also give you more specific instructions. If you have problems or questions, contact your health care provider. °What can I expect after the procedure? °After your procedure, it is common to have: °· Discomfort at the port insertion site. °· Bruising on the skin over the port. This should improve over 3-4 days. ° °Follow these instructions at home: °Port care °· After your port is placed, you will get a manufacturer's information card. The card has information about your port. Keep this card with you at all times. °· Take care of the port as told by your health care provider. Ask your health care provider if you or a family member can get training for taking care of the port at home. A home health care nurse may also take care of the port. °· Make sure to remember what type of port you have. °Incision care °· Follow instructions from your health care provider about how to take care of your port insertion site. Make sure you: °? Wash your hands with soap and water before you change your bandage (dressing). If soap and water are not available, use hand sanitizer. °? Change your dressing as told by your health care provider. °? Leave stitches (sutures), skin glue, or adhesive strips in place. These skin closures may need to stay in place for 2 weeks or longer. If adhesive strip edges start to loosen and curl up, you may trim the loose edges. Do not remove adhesive strips completely unless your health care provider tells you to do that. °· Check your port insertion site every day for signs of infection. Check for: °? More redness, swelling, or pain. °? More fluid or blood. °? Warmth. °? Pus or a bad smell. °General instructions °· Do not take baths, swim, or use a hot tub until your health care provider approves. °· Do not lift anything that is heavier than 10 lb (4.5  kg) for a week, or as told by your health care provider. °· Ask your health care provider when it is okay to: °? Return to work or school. °? Resume usual physical activities or sports. °· Do not drive for 24 hours if you were given a medicine to help you relax (sedative). °· Take over-the-counter and prescription medicines only as told by your health care provider. °· Wear a medical alert bracelet in case of an emergency. This will tell any health care providers that you have a port. °· Keep all follow-up visits as told by your health care provider. This is important. °Contact a health care provider if: °· You cannot flush your port with saline as directed, or you cannot draw blood from the port. °· You have a fever or chills. °· You have more redness, swelling, or pain around your port insertion site. °· You have more fluid or blood coming from your port insertion site. °· Your port insertion site feels warm to the touch. °· You have pus or a bad smell coming from the port insertion site. °Get help right away if: °· You have chest pain or shortness of breath. °· You have bleeding from your port that you cannot control. °Summary °· Take care of the port as told by your health care provider. °· Change your dressing as told by your health care provider. °· Keep all follow-up visits as told by your health care provider. °  This information is not intended to replace advice given to you by your health care provider. Make sure you discuss any questions you have with your health care provider. °Document Released: 11/10/2012 Document Revised: 12/12/2015 Document Reviewed: 12/12/2015 °[employer] Interactive Patient Education © 2017 [employer] ° °

## 2017-08-21 NOTE — Patient Instructions (Signed)

## 2017-08-21 NOTE — Progress Notes (Signed)
Hematology and Oncology Follow Up Visit  Bradley Meadows [id number] 1940/09/25 77 y.o. 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  He might be feeling a little bit better.  He does have issues.  He does have some heart trouble.  We did an echocardiogram on him.  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  His echocardiogram shows an ejection fraction of 40-45%.  I think this is on the low side.  He does have a cardiologist.  We will have to make a referral to him for management.  He is complaining of hand pain.  He has stiffness in his hands.  We will get some x-rays of his hands today.  I will then see about making a referral to orthopedic surgery.  He will get some Toradol in the office today.   He seems to be eating a little bit better.  He has no nausea or vomiting.  He has no diarrhea.  His voice might be a little bit stronger.  His lab work today looked pretty good.  I really saw nothing that we needed to correct.  I did start him on some gabapentin for his neuropathy.  I do not know if the hand pain might be from neuropathy.  He has been on gabapentin for about a week.  Is not done all that much yet.  There is been no fever.  He has had no bleeding.  He still has a Port-A-Cath in place.    Currently, his performance status is ECOG 2.   Medications:  Allergies as of 08/21/2017      Reactions   No Known Allergies       Medication List        Accurate as of 08/21/17 10:43 AM. Always use your most recent med list.          aspirin EC 81 MG tablet Take 81 mg by mouth  daily.   atorvastatin 40 MG tablet Commonly known as:  LIPITOR TAKE 1 TABLET(40 MG) BY MOUTH DAILY   CARAFATE 1 GM/10ML suspension Generic drug:  sucralfate Take 10 mLs (1 g total) by mouth 2 (two) times daily.   gabapentin 300 MG capsule Commonly known as:  NEURONTIN Take 1 capsule (300 mg total) by mouth 3 (three) times daily.   HYDROcodone-acetaminophen 5-325 MG tablet Commonly known as:  NORCO/VICODIN Take 1 tablet by mouth every 6 (six) hours as needed for severe pain.   ipratropium-albuterol 0.5-2.5 (3) MG/3ML Soln Commonly known as:  DUONEB USE 1 VIAL VIA NEBULIZER EVERY 6 HOURS AS NEEDED FOR SHORTNESS OF BREATH OR WHEEZING   Ipratropium-Albuterol 20-100 MCG/ACT Aers respimat Commonly known as:  COMBIVENT RESPIMAT INHALE 1 PUFF BY MOUTH EVERY 6 HOURS AS NEEDED FOR WHEEZING   meclizine 12.5 MG tablet Commonly known as:  ANTIVERT Take 1 tablet (12.5 mg total) by mouth 3 (three) times daily as needed for dizziness.   metoprolol tartrate 25 MG tablet Commonly known as:  LOPRESSOR TAKE 1/2 TABLELET(12.5 MG) BY MOUTH TWICE DAILY   multivitamin tablet Take 1 tablet  by mouth daily. Centrum Silver   omeprazole 20 MG capsule Commonly known as:  PRILOSEC Take 1 capsule (20 mg total) by mouth daily.   Tiotropium Bromide-Olodaterol 2.5-2.5 MCG/ACT Aers Commonly known as:  STIOLTO RESPIMAT Inhale 2 puffs into the lungs daily.   vitamin B-6 250 MG tablet Take 1 tablet (250 mg total) by mouth daily.       Allergies:  Allergies  Allergen Reactions  . No Known Allergies     Past Medical History, Surgical history, Social history, and Family History were reviewed and updated.  Review of Systems: Review of Systems  Constitutional: Positive for malaise/fatigue.  HENT: Negative.   Eyes: Negative.   Respiratory: Positive for cough and shortness of breath.   Cardiovascular: Positive for chest pain and leg swelling.  Gastrointestinal: Positive for abdominal pain and  constipation.  Genitourinary: Positive for dysuria and hematuria.  Musculoskeletal: Positive for joint pain and neck pain.  Skin: Negative.   Neurological: Positive for dizziness, tingling and focal weakness.  Endo/Heme/Allergies: Negative.   Psychiatric/Behavioral: Negative.       Physical Exam:  weight is 163 lb (73.9 kg). His oral temperature is 98.3 F (36.8 C). His blood pressure is 123/61 and his pulse is 66. His respiration is 19 and oxygen saturation is 95%.   Wt Readings from Last 3 Encounters:  08/21/17 163 lb (73.9 kg)  07/10/17 170 lb (77.1 kg)  06/12/17 170 lb 12 oz (77.5 kg)    Physical Exam  Constitutional: He is oriented to person, place, and time.  HENT:  Head: Normocephalic and atraumatic.  Mouth/Throat: Oropharynx is clear and moist.  Eyes: Pupils are equal, round, and reactive to light. EOM are normal.  Neck: Normal range of motion.  Cardiovascular: Normal rate, regular rhythm and normal heart sounds.  Pulmonary/Chest: Effort normal and breath sounds normal.  Abdominal: Soft. Bowel sounds are normal.  Musculoskeletal: Normal range of motion. He exhibits no edema, tenderness or deformity.  Lymphadenopathy:    He has no cervical adenopathy.  Neurological: He is alert and oriented to person, place, and time.  Skin: Skin is warm and dry. No rash noted. No erythema.  Psychiatric: He has a normal mood and affect. His behavior is normal. Judgment and thought content normal.  Vitals reviewed.    Lab Results  Component Value Date   WBC 6.1 08/21/2017   HGB 12.1 (L) 08/21/2017   HCT 35.9 (L) 08/21/2017   MCV 92.8 08/21/2017   PLT 159 08/21/2017   Lab Results  Component Value Date   FERRITIN 240 07/10/2017   IRON 57 07/10/2017   TIBC 223 07/10/2017   UIBC 166 07/10/2017   IRONPCTSAT 26 (L) 07/10/2017   Lab Results  Component Value Date   RBC 3.87 (L) 08/21/2017   No results found for: KPAFRELGTCHN, LAMBDASER, KAPLAMBRATIO No results found for:  IGGSERUM, IGA, IGMSERUM No results found for: Odetta Pink, SPEI   Chemistry      Component Value Date/Time   NA 135 07/10/2017 1140   NA 133 02/06/2017 0757   NA 129 (L) 01/23/2017 1050   K 4.0 07/10/2017 1140   K 3.9 02/06/2017 0757   K 3.4 (L) 01/23/2017 1050   CL 100 07/10/2017 1140   CL 90 (L) 02/06/2017 0757   CO2 29 07/10/2017 1140   CO2 29 02/06/2017 0757   CO2 26 01/23/2017 1050   BUN 11 07/10/2017 1140   BUN 8 02/06/2017 0757   BUN  12.4 01/23/2017 1050   CREATININE 0.70 07/10/2017 1140   CREATININE 0.9 02/06/2017 0757   CREATININE 0.8 01/23/2017 1050      Component Value Date/Time   CALCIUM 8.9 07/10/2017 1140   CALCIUM 9.3 02/06/2017 0757   CALCIUM 8.9 01/23/2017 1050   ALKPHOS 69 07/10/2017 1140   ALKPHOS 79 02/06/2017 0757   ALKPHOS 80 01/23/2017 1050   AST 16 07/10/2017 1140   AST 39 (H) 01/23/2017 1050   ALT 14 07/10/2017 1140   ALT 43 02/06/2017 0757   ALT 27 01/23/2017 1050   BILITOT 0.7 07/10/2017 1140   BILITOT 0.51 01/23/2017 1050      Impression and Plan: Bradley Meadows is a very pleasant 77 yo caucasian gentleman with locally advanced squamous cell carcinoma of the lung. He initially received chemotherapy and radiation.  We then used Durvalumab according to the PACIFIC clinical trial.  There are lot of issues that are not oncologic.  Hopefully, everything can be fixed or improved so Bradley Meadows will have a better quality of life.  We will plan to get him back in about 4 5 weeks.  He will get some IV iron today.  Hopefully this might help with his fatigue and with his joint pain.  Hopefully the cardiologist will be able to help his heart failure.    Volanda Napoleon, MD 7/19/201910:43 AM

## 2017-08-22 LAB — VITAMIN D 25 HYDROXY (VIT D DEFICIENCY, FRACTURES): VIT D 25 HYDROXY: 30.5 ng/mL (ref 30.0–100.0)

## 2017-08-24 ENCOUNTER — Telehealth: Payer: Self-pay | Admitting: *Deleted

## 2017-08-24 LAB — IRON AND TIBC
Iron: 56 ug/dL (ref 42–163)
Saturation Ratios: 27 % — ABNORMAL LOW (ref 42–163)
TIBC: 211 ug/dL (ref 202–409)
UIBC: 155 ug/dL

## 2017-08-24 LAB — FERRITIN: Ferritin: 211 ng/mL (ref 24–336)

## 2017-08-24 NOTE — Telephone Encounter (Addendum)
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  - in the right hand he has calcium deposits around his joints.  I am not sure what this means, but Dr. Rhona Raider can help!!  Laurey Arrow

## 2017-08-27 ENCOUNTER — Other Ambulatory Visit: Payer: Self-pay | Admitting: Hematology & Oncology

## 2017-09-04 ENCOUNTER — Ambulatory Visit: Payer: Medicare Other | Admitting: Physician Assistant

## 2017-09-11 ENCOUNTER — Encounter (HOSPITAL_COMMUNITY)
Admission: RE | Admit: 2017-09-11 | Discharge: 2017-09-11 | Disposition: A | Discharge status: Home / Self Care | Payer: Medicare Other | Source: Ambulatory Visit | Attending: Hematology & Oncology | Admitting: Hematology & Oncology

## 2017-09-11 DIAGNOSIS — C3490 Malignant neoplasm of unspecified part of unspecified bronchus or lung: Secondary | ICD-10-CM | POA: Diagnosis not present

## 2017-09-11 DIAGNOSIS — C3491 Malignant neoplasm of unspecified part of right bronchus or lung: Secondary | ICD-10-CM

## 2017-09-11 LAB — GLUCOSE, CAPILLARY: Glucose-Capillary: 97 mg/dL (ref 70–99)

## 2017-09-11 MED ORDER — FLUDEOXYGLUCOSE F - 18 (FDG) INJECTION
8.1700 | Freq: Once | INTRAVENOUS | Status: AC | PRN
  Administered 2017-09-11: 8.17 via INTRAVENOUS

## 2017-09-14 ENCOUNTER — Telehealth: Payer: Self-pay | Admitting: *Deleted

## 2017-09-14 NOTE — Telephone Encounter (Addendum)
Patient's daughter is aware of results.   ----- Message from Volanda Napoleon, MD sent at 09/14/2017  3:43 PM EDT ----- Call - looks like the PET scan does NOT show any cancer!!!!  Great job!!!  Laurey Arrow

## 2017-09-16 ENCOUNTER — Other Ambulatory Visit: Payer: Self-pay | Admitting: Family Medicine

## 2017-09-16 DIAGNOSIS — K4030 Unilateral inguinal hernia, with obstruction, without gangrene, not specified as recurrent: Secondary | ICD-10-CM

## 2017-09-16 NOTE — Telephone Encounter (Signed)
Called and spoke to pt about the Tramadol and hydrocodone - he is not taking them together and he did not like the Tramadol. Wanted to know if we would refill his Hydrocodone for him?    Hydrocodone   LOV: 05/12/17  LRF:   08/11/17

## 2017-09-17 MED ORDER — HYDROCODONE-ACETAMINOPHEN 5-325 MG PO TABS: 1.0000 | ORAL_TABLET | Freq: Four times a day (QID) | ORAL | 0 refills | Status: DC | PRN

## 2017-09-17 NOTE — Telephone Encounter (Signed)
NTBS to discuss.  Any concerned family member should attend as well (sister?)

## 2017-09-17 NOTE — Telephone Encounter (Signed)
Spoke to pt's dtr and she made an apt for 10/02/17 as he has other apts she has to take him to and that is her first available to bring him as she works full time. She states she will go by there tomorrow and take the Tramadol out of the house but he needs a refill on the hydrocodone at least until apt.

## 2017-09-18 DIAGNOSIS — G5602 Carpal tunnel syndrome, left upper limb: Secondary | ICD-10-CM | POA: Diagnosis not present

## 2017-09-18 DIAGNOSIS — G5601 Carpal tunnel syndrome, right upper limb: Secondary | ICD-10-CM | POA: Diagnosis not present

## 2017-09-25 ENCOUNTER — Inpatient Hospital Stay: Payer: Medicare Other | Admitting: Hematology & Oncology

## 2017-09-25 ENCOUNTER — Inpatient Hospital Stay: Payer: Medicare Other

## 2017-09-30 ENCOUNTER — Encounter

## 2017-09-30 ENCOUNTER — Ambulatory Visit (INDEPENDENT_AMBULATORY_CARE_PROVIDER_SITE_OTHER): Payer: Medicare Other | Admitting: Physician Assistant

## 2017-09-30 ENCOUNTER — Encounter: Payer: Self-pay | Admitting: Physician Assistant

## 2017-09-30 VITALS — BP 120/66 | HR 73 | Ht 72.0 in | Wt 162.2 lb

## 2017-09-30 DIAGNOSIS — Z720 Tobacco use: Secondary | ICD-10-CM

## 2017-09-30 DIAGNOSIS — J449 Chronic obstructive pulmonary disease, unspecified: Secondary | ICD-10-CM | POA: Diagnosis not present

## 2017-09-30 DIAGNOSIS — I5022 Chronic systolic (congestive) heart failure: Secondary | ICD-10-CM

## 2017-09-30 DIAGNOSIS — I10 Essential (primary) hypertension: Secondary | ICD-10-CM | POA: Diagnosis not present

## 2017-09-30 DIAGNOSIS — E785 Hyperlipidemia, unspecified: Secondary | ICD-10-CM | POA: Diagnosis not present

## 2017-09-30 DIAGNOSIS — I2510 Atherosclerotic heart disease of native coronary artery without angina pectoris: Secondary | ICD-10-CM | POA: Diagnosis not present

## 2017-09-30 NOTE — Patient Instructions (Signed)
Medication Instructions:  Your physician recommends that you continue on your current medications as directed. Please refer to the Current Medication list given to you today.   Labwork: Please have a fasting lipid and hepatic panel drawn at Dr.Pickards office  Testing/Procedures: None ordered  Follow-Up: Your physician wants you to follow-up in: 5-6 months with Dr.Croitoru You will receive a reminder letter in the mail two months in advance. If you Damin't receive a letter, please call our office to schedule the follow-up appointment.   Any Other Special Instructions Will Be Listed Below (If Applicable).     If you need a refill on your cardiac medications before your next appointment, please call your pharmacy.

## 2017-09-30 NOTE — Progress Notes (Signed)
Cardiology Office Note    Date:  10/02/2017   ID:  Bradley Meadows, DOB 02/25/1940, MRN [id number]  PCP:  Susy Frizzle, MD  Cardiologist:  Dr. Sallyanne Kuster   Chief Complaint  Patient presents with  . Follow-up    seen for Dr. Sallyanne Kuster    History of Present Illness:  Bradley Meadows is a 77 y.o. male with PMH of COPD, GERD, hypertension, hyperlipidemia, PVCs, right squamous cell lung cancer, and history of coronary artery disease.  Myoview obtained in March 2008 showed lateral wall scar and mildly reduced EF of 49%.  Cardiac catheterization April 2018 showed occluded left circumflex artery with collaterals, medical therapy was recommended.  Myoview obtained on 02/19/2016 showed EF 53, medium defect of moderate severity present in the basal inferolateral and apical lateral location, overall considered low risk study.  He has underwent chemotherapy and radiation therapy for his lung cancer.  Recent PET scan showed improvement in bilateral pulmonary nodule and obesity consistent with improved pulmonary fracture or inflammatory process.  No evidence of lung cancer recurrence or metastasis.  Recently, he obtain echocardiogram on 07/24/2017 which showed EF 40 to 45%, hypokinesis in the basal to mid inferior, inferolateral, anterolateral myocardium, grade 1 DD, mild MR, severely dilated LAD, PA peak pressure 42 mmHg.  He presents today for cardiology office visit.  He remains fairly weak.  Unfortunately he continues to smoke at this time.  He denies any chest pain.  His shortness of breath at baseline is unchanged.  However his shortness of breath is likely more related to pulmonary issue than cardiac issue.  Recent echocardiogram has been reviewed, ejection fraction has not changed when compared to the previous EF.  The hypokinesis in the basal to mid inferolateral myocardium likely correspond to the occluded left circumflex.  Given lack of chest pain, this does not warrant any further work-up.  From cardiology  perspective, there is no significant solution for his baseline weakness.  Unfortunately he has multiple comorbidities that can cause to weakness including chronic respiratory failure and lung cancer that required chemotherapy and radiation therapy.     Past Medical History:  Diagnosis Date  . Arthritis   . Asthma   . CHF (congestive heart failure) (HCC)    ef=45-50%  . COPD (chronic obstructive pulmonary disease) ([place])    home O2  . Coronary artery disease    40-50% mLAD, 40% pRCA, 30% mRCA, occluded CX with retrograde filling 06/01/06 cath  . GERD (gastroesophageal reflux disease)    ulcers  . Goals of care, counseling/discussion 04/07/2016  . History of external beam radiation therapy 04/24/16-06/04/16   chest 60 Gy in 30 fractions  . Hyperlipidemia   . Hypertension   . 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 PVC's (premature ventricular contractions)    2017  . Squamous cell lung cancer, right ([place]) 04/07/2016    Past Surgical History:  Procedure Laterality Date  . ESOPHAGOGASTRODUODENOSCOPY (EGD) WITH PROPOFOL N/A 02/26/2016   Procedure: ESOPHAGOGASTRODUODENOSCOPY (EGD) WITH PROPOFOL;  Surgeon: Irene Shipper, MD;  Location: [facility];  Service: Endoscopy;  Laterality: N/A;  . EXPLORATORY LAPAROTOMY     for ulcer  "washed me out with a hose pipe"  . EYE SURGERY     cataracts  . HERNIA REPAIR    . INGUINAL HERNIA REPAIR Left 09/26/2016   Procedure: HERNIA REPAIR INGUINAL LEFT;  Surgeon: Jackolyn Confer, MD;  Location: [facility];  Service: General;  Laterality: Left;  With MESH  . INGUINAL HERNIA REPAIR Right 02/26/2017   Procedure: HERNIA REPAIR INGUINAL INCARCERATED WITH MESH;  Surgeon: Excell Seltzer, MD;  Location: [facility];  Service: General;  Laterality: Right;  . INSERTION OF MESH Right 02/26/2017   Procedure: INSERTION OF MESH;  Surgeon: Excell Seltzer, MD;  Location: [facility];  Service: General;  Laterality: Right;  . IR  GENERIC HISTORICAL  04/10/2016   IR US GUIDE VASC ACCESS RIGHT 04/10/2016 Markus Daft, MD [facility]  . IR GENERIC HISTORICAL  04/10/2016   IR FLUORO GUIDE PORT INSERTION RIGHT 04/10/2016 Markus Daft, MD [facility]  . IR GENERIC HISTORICAL  04/10/2016   IR FLUORO RM 30-60 MIN 04/10/2016 Markus Daft, MD [facility]  . IR GENERIC HISTORICAL  04/18/2016   IR FLUORO RM 30-60 MIN 04/18/2016 Greggory Keen, MD [facility]  . JOINT REPLACEMENT     knee both  . PORTACATH PLACEMENT Right 04/10/2016  . VIDEO BRONCHOSCOPY WITH ENDOBRONCHIAL ULTRASOUND N/A 03/26/2016   Procedure: VIDEO BRONCHOSCOPY WITH ENDOBRONCHIAL ULTRASOUND;  Surgeon: Collene Gobble, MD;  Location: [facility];  Service: Thoracic;  Laterality: N/A;    Current Medications: Outpatient Medications Prior to Visit  Medication Sig Dispense Refill  . aspirin EC 81 MG tablet Take 81 mg by mouth daily.    [name] atorvastatin (LIPITOR) 40 MG tablet TAKE 1 TABLET(40 MG) BY MOUTH DAILY 90 tablet 2  . CARAFATE 1 GM/10ML suspension Take 10 mLs (1 g total) by mouth 2 (two) times daily. 420 mL 6  . HYDROcodone-acetaminophen (NORCO/VICODIN) 5-325 MG tablet Take 1 tablet by mouth every 6 (six) hours as needed for severe pain. 60 tablet 0  . Ipratropium-Albuterol (COMBIVENT RESPIMAT) 20-100 MCG/ACT AERS respimat INHALE 1 PUFF BY MOUTH EVERY 6 HOURS AS NEEDED FOR WHEEZING 4 g 5  . ipratropium-albuterol (DUONEB) 0.5-2.5 (3) MG/3ML SOLN USE 1 VIAL VIA NEBULIZER EVERY 6 HOURS AS NEEDED FOR SHORTNESS OF BREATH OR WHEEZING 990 mL 0  . metoprolol tartrate (LOPRESSOR) 25 MG tablet TAKE 1/2 TABLELET(12.5 MG) BY MOUTH TWICE DAILY 90 tablet 1  . Multiple Vitamin (MULTIVITAMIN) tablet Take 1 tablet by mouth daily. Centrum Silver    . omeprazole (PRILOSEC) 20 MG capsule Take 1 capsule (20 mg total) by mouth daily. 90 capsule 3  . Pyridoxine HCl (VITAMIN B-6) 250 MG tablet Take 1 tablet (250 mg total) by mouth daily. 30 tablet 6  . Tiotropium Bromide-Olodaterol (STIOLTO RESPIMAT)  2.5-2.5 MCG/ACT AERS Inhale 2 puffs into the lungs daily. 4 g 5  . gabapentin (NEURONTIN) 300 MG capsule Take 1 capsule (300 mg total) by mouth 3 (three) times daily. 90 capsule 2  . meclizine (ANTIVERT) 12.5 MG tablet TAKE 1 TABLET(12.5 MG) BY MOUTH THREE TIMES DAILY AS NEEDED FOR DIZZINESS 60 tablet 0   No facility-administered medications prior to visit.      Allergies:   No known allergies   Social History   Socioeconomic History  . Marital status: Divorced    Spouse name: Not on file  . Number of children: 3  . Years of education: 9  . Highest education level: Not on file  Occupational History  . Occupation: RetiredResearch officer, political party: RETIRED  Social Needs  . Financial resource strain: Not on file  . Food insecurity:    Worry: Not on file    Inability: Not on file  . Transportation needs:    Medical: Not on file    Non-medical: Not on file  Tobacco Use  . Smoking status: Former Smoker    Packs/day: 1.50    Years: 30.00    Pack years: 45.00    Types: Cigarettes    Last attempt to quit: 09/29/2015    Years since quitting: 2.0  . Smokeless tobacco: Never Used  Substance and Sexual Activity  . Alcohol use: No  . Drug use: No  . Sexual activity: Not Currently  Lifestyle  . Physical activity:    Days per week: Not on file    Minutes per session: Not on file  . Stress: Not on file  Relationships  . Social connections:    Talks on phone: Not on file    Gets together: Not on file    Attends religious service: Not on file    Active member of club or organization: Not on file    Attends meetings of clubs or organizations: Not on file    Relationship status: Not on file  Other Topics Concern  . Not on file  Social History Narrative   Grew up in [place] area.    Left school after 9th grade. No father and had to work.    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       Health Care POA:    Emergency Contact: sister, Hector Brunswick (h) [phone]   End of Life  Plan:    Who lives with you: self   Any pets: Lorrin Goodell   Diet: Pt limits sugars and starches and focuses on vegetables an protein. Pt has lost 50 lbs over several years.   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   Seatbelts: Pt reports wearing seatbelt when in vehicle.   Sun Exposure/Protection: Pt does not use sun protectin.   Hobbies: Racing, TV, gardening, Architect           Family History:  The patient's family history includes Alcohol abuse in his father; Arthritis in his mother; Breast cancer in his sister; COPD in his mother; Heart disease in his paternal grandfather; Hyperlipidemia in his sister.   ROS:   Please see the history of present illness.    ROS All other systems reviewed and are negative.   PHYSICAL EXAM:   VS:  BP 120/66   Pulse 73   Ht 6' (1.829 m)   Wt 162 lb 3.2 oz (73.6 kg)   BMI 22.00 kg/m    GEN: Well nourished, well developed, in no acute distress  HEENT: normal  Neck: no JVD, carotid bruits, or masses Cardiac: RRR; no murmurs, rubs, or gallops,no edema  Respiratory:  clear to auscultation bilaterally, normal work of breathing GI: soft, nontender, nondistended, + BS MS: no deformity or atrophy  Skin: warm and dry, no rash Neuro:  Alert and Oriented x 3, Strength and sensation are intact Psych: euthymic mood, full affect  Wt Readings from Last 3 Encounters:  09/30/17 162 lb 3.2 oz (73.6 kg)  08/21/17 163 lb (73.9 kg)  07/10/17 170 lb (77.1 kg)      Studies/Labs Reviewed:   EKG:  EKG is ordered today.  The ekg ordered today demonstrates normal sinus rhythm, right bundle branch block.  Recent Labs: 07/10/2017: TSH 1.649 08/21/2017: ALT 15; BUN 6; Creatinine 0.60; Hemoglobin 12.1; Platelet Count 159; Potassium 3.9; Sodium 134   Lipid Panel    Component Value Date/Time   CHOL 114 (L) 04/25/2015 0825   TRIG 92 04/25/2015 0825   HDL 35 (L) 04/25/2015  0825   CHOLHDL  3.3 04/25/2015 0825   VLDL 18 04/25/2015 0825   LDLCALC 61 04/25/2015 0825   LDLDIRECT 82 05/26/2012 1141    Additional studies/ records that were reviewed today include:   Myoview 02/19/2016 Study Highlights    The left ventricular ejection fraction is normal (55-65%).  Nuclear stress EF: 53%. Visually,  Defect 1: There is a medium defect of moderate severity present in the basal inferolateral and apical lateral location.  The study is normal.  This is a low risk study.    Echo 07/24/2017 LV EF: 40% -   45% Study Conclusions  - Left ventricle: The cavity size was mildly dilated. Systolic   function was mildly to moderately reduced. The estimated ejection   fraction was in the range of 40% to 45%. Hypokinesis in the basal   to mid inferior, inferolateral, and anterolateral myocardium.   Doppler parameters are consistent with abnormal left ventricular   relaxation (grade 1 diastolic dysfunction). - Aortic valve: Transvalvular velocity was within the normal range.   There was no stenosis. There was no regurgitation. - Mitral valve: Moderately calcified annulus. Transvalvular   velocity was within the normal range. There was no evidence for   stenosis. There was mild regurgitation. - Left atrium: The atrium was severely dilated. - Right ventricle: The cavity size was normal. Wall thickness was   normal. Systolic function was normal. - Right atrium: The atrium was moderately dilated. - Tricuspid valve: There was trivial regurgitation. - Pulmonary arteries: Systolic pressure was mildly increased. PA   peak pressure: 42 mm Hg (S).    ASSESSMENT:    1. Chronic systolic heart failure ([place])   2. Essential hypertension   3. Hyperlipidemia, unspecified hyperlipidemia type   4. Coronary artery disease involving native coronary artery of native heart without angina pectoris   5. Chronic obstructive pulmonary disease, unspecified COPD type ([place])   6. Tobacco abuse       PLAN:  In order of problems listed above:  1. Chronic systolic heart failure: Recent echocardiogram shows ejection fraction stable at 40 to 45%.  He appears to be euvolemic on physical exam  2. CAD: History of occluded left circumflex artery on the last cardiac catheterization, medical therapy was recommended.  The wall motion abnormality that was seen on recent echocardiogram likely correspond to the left circumflex disease noted on the last cardiac cath.  Continue aspirin and Lipitor.  Given lack of chest pain, no further work-up is warranted at this time  3. Hypertension: Blood pressure stable  4. Hyperlipidemia: On Lipitor 40 mg daily.  Due for fasting lipid panel and LFT at PCPs office.  5. Tobacco abuse: Unfortunately he continued to smoke at this time even with pulmonary issue and history of lung cancer.  He really need to stop smoking.  Tobacco cessation discussed.    Medication Adjustments/Labs and Tests Ordered: Current medicines are reviewed at length with the patient today.  Concerns regarding medicines are outlined above.  Medication changes, Labs and Tests ordered today are listed in the Patient Instructions below. Patient Instructions  Medication Instructions:  Your physician recommends that you continue on your current medications as directed. Please refer to the Current Medication list given to you today.   Labwork: Please have a fasting lipid and hepatic panel drawn at Dr.Pickards office  Testing/Procedures: None ordered  Follow-Up: Your physician wants you to follow-up in: 5-6 months with Dr.Croitoru You will receive a reminder letter in the mail two months  in advance. If you Ekam't receive a letter, please call our office to schedule the follow-up appointment.   Any Other Special Instructions Will Be Listed Below (If Applicable).     If you need a refill on your cardiac medications before your next appointment, please call your pharmacy.       Hilbert Corrigan, [place]  10/02/2017 11:52 PM    [facility] [address], [place], [place]  [address] Phone: [phone]; Fax: [phone]

## 2017-10-01 ENCOUNTER — Other Ambulatory Visit: Payer: Self-pay | Admitting: Hematology & Oncology

## 2017-10-02 ENCOUNTER — Ambulatory Visit: Payer: Medicare Other | Admitting: Family Medicine

## 2017-10-02 ENCOUNTER — Encounter: Payer: Self-pay | Admitting: Physician Assistant

## 2017-10-09 ENCOUNTER — Encounter: Payer: Self-pay | Admitting: Family Medicine

## 2017-10-09 ENCOUNTER — Ambulatory Visit (INDEPENDENT_AMBULATORY_CARE_PROVIDER_SITE_OTHER): Payer: Medicare Other | Admitting: Family Medicine

## 2017-10-09 VITALS — BP 118/48 | HR 84 | Temp 98.1°F | Resp 22 | Ht 72.0 in | Wt 163.0 lb

## 2017-10-09 DIAGNOSIS — E7800 Pure hypercholesterolemia, unspecified: Secondary | ICD-10-CM

## 2017-10-09 DIAGNOSIS — R5382 Chronic fatigue, unspecified: Secondary | ICD-10-CM | POA: Diagnosis not present

## 2017-10-09 DIAGNOSIS — I2510 Atherosclerotic heart disease of native coronary artery without angina pectoris: Secondary | ICD-10-CM

## 2017-10-09 DIAGNOSIS — R42 Dizziness and giddiness: Secondary | ICD-10-CM

## 2017-10-09 DIAGNOSIS — R6889 Other general symptoms and signs: Secondary | ICD-10-CM | POA: Diagnosis not present

## 2017-10-09 DIAGNOSIS — Z23 Encounter for immunization: Secondary | ICD-10-CM | POA: Diagnosis not present

## 2017-10-09 NOTE — Progress Notes (Signed)
Subjective:    Patient ID: Bradley Meadows, male    DOB: 12-24-1940, 77 y.o.   MRN: [id number]  HPI  03/13/17 Recently had emergency surgery for an incarcerated hernia.  Since recovery for his surgery, his blood pressure has been very low.  Today is 92/50.  He reports dizziness upon standing.  He reports feeling lightheaded.  He denies any chest pain shortness of breath or dyspnea on exertion beyond his baseline.  He denies any blood in his stool or melena.  He denies any fevers or chills or signs of any infection.  At that time, my plan was: Patient's blood pressure is low.  There is no evidence of an infection.  He denies any fevers or chills or coughing or dysuria.  He has had no further diarrhea since discharge from the hospital.  Therefore I do not believe his low blood pressures from sepsis.  He denies any blood loss.  Therefore I will discontinue his Maxide.  I will reduce his Lopressor to 12.5 mg twice daily and recheck blood pressure on Monday.  If blood pressure still low Monday, we will discontinue Lopressor altogether and also check CBC as well as a workup for possible underlying infection.  However today at the patient appears to be overmedicated as clinically he appears well and is laughing and smiling and in no apparent distress.  I believe that the blood pressure medication is too much given all the recent stress his body has been under  03/27/17 Patient continues to feel weak and tired despite the fact his blood pressure is better than his last visit.  He also continues to feel dizzy.  Recently his cancer doctor put him on Dramamine for dizziness.  His blood pressure today is better although still low given his age.  He is also concerned by a large painful "boil" on his upper back.  On exam, there is a large red inflamed sebaceous cyst approximately 3 cm in diameter which is extremely tender to the touch and fluctuant.  It is spontaneously draining cyst sac contents through pores on the surface  of the skin.  At that time, my plan was: I  still feel that low blood pressure could be playing a role in his fatigue.  I believe his fatigue is likely multifactorial but I will have the patient temporarily discontinue metoprolol altogether and then reassess the patient next week to see if he is improving.  I anesthetized the inflamed sebaceous cyst on his upper back was 0.1% lidocaine with epinephrine.  I then made a 1 cm vertical incision in the center of the cyst and expressed cyst sac contents through the opening.  I then probed and cleaned the wound thoroughly with Q-tip soaked with hydrogen peroxide and then packed the cavity with approximately 10 inches of 1/4 inch iodoform gauze.  Recheck here on Tuesday.  Wound care was discussed with the patient's family.  05/12/17 Patient is here today requesting irrigation and lavage to remove a cerumen impaction in his left ear.  Right ear is relatively clear.  There is a cerumen impaction in the left ear.  Irrigation and lavage was attempted today in clinic however patient had a severe Arnold reflex and could not stop coughing.  He requested that we quit irrigation and lavage.  We were able to remove the majority of the wax but there is still a cerumen impaction in place.  I have recommended trying over-the-counter Murine eardrops for 5 days versus Debrox to try  to remove the residual impaction.  He also continues to complain of dizziness.  He has not discontinued metoprolol.  He states that he feels fine when he first wakes up in the morning however he takes his medication and begins to feel lightheaded and dizzy.  Throughout the day he suffers from dizziness and then approximately 1 hour before bedtime the dizziness will improve.  It seems to be related to taking his medication.  His cancer specialist did obtain carotid Dopplers which revealed no significant limitation in cerebral blood flow.  Patient refuses MRI to evaluate dizziness and he denies any vertigo  symptoms.  At that time, my plan was: Patient will try over-the-counter Murine or Debrox eardrops to remove the residual impaction that we were unable to remove with irrigation and lavage.  I have recommended discontinuing metoprolol to see if relative hypotension could be causing the patient's dizziness particular given the fact symptoms worsen as soon as he takes his medication and improve the longer he is off the medication.  If symptoms do not improve, the next step would be an MRI of the brain.  10/09/17  Patient is here today for follow-up.  He continues to report dizziness.  He was unable to tolerate the MRI of the brain due to claustrophobia.  His oncologist has performed carotid Dopplers which showed less than 50% stenosis of the internal carotid arteries bilaterally.  He states that he is no longer taking metoprolol and has seen no improvement in the dizziness since stopping the medication.  He does not associate dizziness with taking his hydrocodone.  Usually he takes this once a day.  Occasionally he will take it twice a day.  However the dizziness seems to be unrelated to this.  He denies any falls.  He denies any syncope.  He denies any confusion or disorientation on hydrocodone.  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  He saw no benefit from the tramadol and discontinued it and elects to stay on hydrocodone instead.  He also said no benefit from the gabapentin although it did make his dizziness slightly worse.  Therefore he discontinued the gabapentin.  He is seen orthopedics and it sounds like he had a cortisone injection for carpal tunnel which helped temporarily.  He has follow-up scheduled with them next Friday.  He continues to endorse severe fatigue and just lack of energy.  His thyroid has been tested this year and is normal.  He does have mild anemia with a hemoglobin of 12 and has  received 2 iron infusions however has seen no benefit from this.  I do not see that he is ever had his vitamin B12 tested or his testosterone level tested Past Medical History:  Diagnosis Date  . Arthritis   . Asthma   . CHF (congestive heart failure) (HCC)    ef=45-50%  . COPD (chronic obstructive pulmonary disease) ([place])    home O2  . Coronary artery disease    40-50% mLAD, 40% pRCA, 30% mRCA, occluded CX with retrograde filling 06/01/06 cath  . GERD (gastroesophageal reflux disease)    ulcers  . Goals of care, counseling/discussion 04/07/2016  . History of external beam radiation therapy 04/24/16-06/04/16   chest 60 Gy in 30 fractions  . Hyperlipidemia   . Hypertension   . 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 PVC's (premature ventricular contractions)  2017  . Squamous cell lung cancer, right ([place]) 04/07/2016   Past Surgical History:  Procedure Laterality Date  . ESOPHAGOGASTRODUODENOSCOPY (EGD) WITH PROPOFOL N/A 02/26/2016   Procedure: ESOPHAGOGASTRODUODENOSCOPY (EGD) WITH PROPOFOL;  Surgeon: Irene Shipper, MD;  Location: [facility];  Service: Endoscopy;  Laterality: N/A;  . EXPLORATORY LAPAROTOMY     for ulcer  "washed me out with a hose pipe"  . EYE SURGERY     cataracts  . HERNIA REPAIR    . INGUINAL HERNIA REPAIR Left 09/26/2016   Procedure: HERNIA REPAIR INGUINAL LEFT;  Surgeon: Jackolyn Confer, MD;  Location: [facility];  Service: General;  Laterality: Left;  With MESH  . INGUINAL HERNIA REPAIR Right 02/26/2017   Procedure: HERNIA REPAIR INGUINAL INCARCERATED WITH MESH;  Surgeon: Excell Seltzer, MD;  Location: [facility];  Service: General;  Laterality: Right;  . INSERTION OF MESH Right 02/26/2017   Procedure: INSERTION OF MESH;  Surgeon: Excell Seltzer, MD;  Location: [facility];  Service: General;  Laterality: Right;  . IR GENERIC HISTORICAL  04/10/2016   IR US GUIDE VASC ACCESS RIGHT 04/10/2016 Markus Daft, MD [facility]  . IR  GENERIC HISTORICAL  04/10/2016   IR FLUORO GUIDE PORT INSERTION RIGHT 04/10/2016 Markus Daft, MD [facility]  . IR GENERIC HISTORICAL  04/10/2016   IR FLUORO RM 30-60 MIN 04/10/2016 Markus Daft, MD [facility]  . IR GENERIC HISTORICAL  04/18/2016   IR FLUORO RM 30-60 MIN 04/18/2016 Greggory Keen, MD [facility]  . JOINT REPLACEMENT     knee both  . PORTACATH PLACEMENT Right 04/10/2016  . VIDEO BRONCHOSCOPY WITH ENDOBRONCHIAL ULTRASOUND N/A 03/26/2016   Procedure: VIDEO BRONCHOSCOPY WITH ENDOBRONCHIAL ULTRASOUND;  Surgeon: Collene Gobble, MD;  Location: [facility];  Service: Thoracic;  Laterality: N/A;   Current Outpatient Medications on File Prior to Visit  Medication Sig Dispense Refill  . aspirin EC 81 MG tablet Take 81 mg by mouth daily.    [name] atorvastatin (LIPITOR) 40 MG tablet TAKE 1 TABLET(40 MG) BY MOUTH DAILY 90 tablet 2  . CARAFATE 1 GM/10ML suspension Take 10 mLs (1 g total) by mouth 2 (two) times daily. 420 mL 6  . HYDROcodone-acetaminophen (NORCO/VICODIN) 5-325 MG tablet Take 1 tablet by mouth every 6 (six) hours as needed for severe pain. 60 tablet 0  . Ipratropium-Albuterol (COMBIVENT RESPIMAT) 20-100 MCG/ACT AERS respimat INHALE 1 PUFF BY MOUTH EVERY 6 HOURS AS NEEDED FOR WHEEZING 4 g 5  . ipratropium-albuterol (DUONEB) 0.5-2.5 (3) MG/3ML SOLN USE 1 VIAL VIA NEBULIZER EVERY 6 HOURS AS NEEDED FOR SHORTNESS OF BREATH OR WHEEZING 990 mL 0  . meclizine (ANTIVERT) 12.5 MG tablet TAKE 1 TABLET(12.5 MG) BY MOUTH THREE TIMES DAILY AS NEEDED FOR DIZZINESS 60 tablet 0  . metoprolol tartrate (LOPRESSOR) 25 MG tablet TAKE 1/2 TABLELET(12.5 MG) BY MOUTH TWICE DAILY 90 tablet 1  . Multiple Vitamin (MULTIVITAMIN) tablet Take 1 tablet by mouth daily. Centrum Silver    . omeprazole (PRILOSEC) 20 MG capsule Take 1 capsule (20 mg total) by mouth daily. 90 capsule 3  . Pyridoxine HCl (VITAMIN B-6) 250 MG tablet Take 1 tablet (250 mg total) by mouth daily. 30 tablet 6  . Tiotropium Bromide-Olodaterol (STIOLTO  RESPIMAT) 2.5-2.5 MCG/ACT AERS Inhale 2 puffs into the lungs daily. 4 g 5   No current facility-administered medications on file prior to visit.    Allergies  Allergen Reactions  . No Known Allergies    Social History   Socioeconomic History  .  Marital status: Divorced    Spouse name: Not on file  . Number of children: 3  . Years of education: 9  . Highest education level: Not on file  Occupational History  . Occupation: RetiredResearch officer, political party: RETIRED  Social Needs  . Financial resource strain: Not on file  . Food insecurity:    Worry: Not on file    Inability: Not on file  . Transportation needs:    Medical: Not on file    Non-medical: Not on file  Tobacco Use  . Smoking status: Former Smoker    Packs/day: 1.50    Years: 30.00    Pack years: 45.00    Types: Cigarettes    Last attempt to quit: 09/29/2015    Years since quitting: 2.0  . Smokeless tobacco: Never Used  Substance and Sexual Activity  . Alcohol use: No  . Drug use: No  . Sexual activity: Not Currently  Lifestyle  . Physical activity:    Days per week: Not on file    Minutes per session: Not on file  . Stress: Not on file  Relationships  . Social connections:    Talks on phone: Not on file    Gets together: Not on file    Attends religious service: Not on file    Active member of club or organization: Not on file    Attends meetings of clubs or organizations: Not on file    Relationship status: Not on file  . Intimate partner violence:    Fear of current or ex partner: Not on file    Emotionally abused: Not on file    Physically abused: Not on file    Forced sexual activity: Not on file  Other Topics Concern  . Not on file  Social History Narrative   Grew up in [place] area.    Left school after 9th grade. No father and had to work.    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       Health Care POA:    Emergency Contact: sister, Hector Brunswick (h) [phone]   End of Life Plan:      Who lives with you: self   Any pets: Lorrin Goodell   Diet: Pt limits sugars and starches and focuses on vegetables an protein. Pt has lost 50 lbs over several years.   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   Seatbelts: Pt reports wearing seatbelt when in vehicle.   Sun Exposure/Protection: Pt does not use sun protectin.   Hobbies: Racing, TV, gardening, Architect            Review of Systems  All other systems reviewed and are negative.      Objective:   Physical Exam  Constitutional: He is oriented to person, place, and time.  HENT:  Head: Normocephalic and atraumatic.  Right Ear: External ear normal.  Left Ear: External ear normal.  Nose: Nose normal.  Mouth/Throat: Oropharynx is clear and moist. No oropharyngeal exudate.  Cardiovascular: Normal rate, regular rhythm and normal heart sounds.  Pulmonary/Chest: Effort normal and breath sounds normal. No respiratory distress. He has no wheezes. He has no rales.  Abdominal: Soft. Bowel sounds are normal. He exhibits no distension. There is no tenderness. There is no rebound.  Neurological: He is alert and oriented to person, place, and time. No cranial nerve deficit. He exhibits normal muscle tone. Coordination normal.  Vitals reviewed.  Assessment & Plan:   Chronic fatigue - Plan: Vitamin B12, Testosterone Total,Free,Bio, Males  Pure hypercholesterolemia - Plan: COMPLETE METABOLIC PANEL WITH GFR, Lipid panel  Need for prophylactic vaccination and inoculation against influenza - Plan: Flu Vaccine QUAD 36+ mos IM Patient's daughter went home and found that the patient had been confused.  He has been taking metoprolol one half a tablet twice a day.  I have recommended that they temporarily discontinue this given his relatively low blood pressure and his dizziness and see if his dizziness improves.  If it does not, I would recommend resuming the  metoprolol given his history of coronary artery disease and CHF given the mortality benefits associated with beta-blocker.  This is a temporary trial to see if the medication may be inducing his dizziness.  Also given his fatigue, I will check a vitamin B12 level and a testosterone level.  If his vitamin B12 is low perhaps he would benefit from parenteral B12 injections.  If his testosterone is low, we could talk about testosterone replacement as a means to try to help with his energy level although this would also carry increased cardiovascular risk as well.  His cardiologist has requested that we check a fasting lipid panel and CMP and I will be glad to do this.  If no better, I will schedule a CT scan of the brain to determine if the patient has suffered from a cerebrovascular accident that may be contributing to his chronic dizziness given he cannot tolerate MRI.

## 2017-10-12 ENCOUNTER — Other Ambulatory Visit: Payer: Medicare Other

## 2017-10-13 LAB — VITAMIN B12: Vitamin B-12: 355 pg/mL (ref 200–1100)

## 2017-10-13 LAB — TESTOSTERONE TOTAL,FREE,BIO, MALES
Albumin: 3.6 g/dL (ref 3.6–5.1)
Sex Hormone Binding: 95 nmol/L — ABNORMAL HIGH (ref 22–77)
Testosterone, Bioavailable: 37 ng/dL (ref 15.0–150.0)
Testosterone, Free: 22.2 pg/mL (ref 6.0–73.0)
Testosterone: 429 ng/dL (ref 250–827)

## 2017-10-13 LAB — COMPLETE METABOLIC PANEL WITHOUT GFR
AG Ratio: 1.7 (calc) (ref 1.0–2.5)
ALT: 12 U/L (ref 9–46)
AST: 15 U/L (ref 10–35)
Albumin: 3.6 g/dL (ref 3.6–5.1)
Alkaline phosphatase (APISO): 63 U/L (ref 40–115)
BUN: 9 mg/dL (ref 7–25)
CO2: 32 mmol/L (ref 20–32)
Calcium: 9.4 mg/dL (ref 8.6–10.3)
Chloride: 97 mmol/L — ABNORMAL LOW (ref 98–110)
Creat: 0.72 mg/dL (ref 0.70–1.18)
GFR, Est African American: 104 mL/min/{1.73_m2}
GFR, Est Non African American: 90 mL/min/{1.73_m2}
Globulin: 2.1 g/dL (ref 1.9–3.7)
Glucose, Bld: 95 mg/dL (ref 65–99)
Potassium: 4.2 mmol/L (ref 3.5–5.3)
Sodium: 137 mmol/L (ref 135–146)
Total Bilirubin: 0.6 mg/dL (ref 0.2–1.2)
Total Protein: 5.7 g/dL — ABNORMAL LOW (ref 6.1–8.1)

## 2017-10-13 LAB — LIPID PANEL
Cholesterol: 127 mg/dL (ref <200)
HDL: 51 mg/dL (ref >40)
LDL CHOLESTEROL (CALC): 62 mg/dL
NON-HDL CHOLESTEROL (CALC): 76 mg/dL (ref <130)
TRIGLYCERIDES: 61 mg/dL (ref <150)
Total CHOL/HDL Ratio: 2.5 (calc) (ref <5.0)

## 2017-10-16 DIAGNOSIS — G5603 Carpal tunnel syndrome, bilateral upper limbs: Secondary | ICD-10-CM | POA: Diagnosis not present

## 2017-10-19 ENCOUNTER — Other Ambulatory Visit: Payer: Self-pay | Admitting: Family Medicine

## 2017-10-19 DIAGNOSIS — K4030 Unilateral inguinal hernia, with obstruction, without gangrene, not specified as recurrent: Secondary | ICD-10-CM

## 2017-10-19 NOTE — Telephone Encounter (Signed)
Patient is requesting a refill on Hydrocodone   LOV: 10/09/16  LRF: 10/18/17

## 2017-10-20 MED ORDER — HYDROCODONE-ACETAMINOPHEN 5-325 MG PO TABS: 1.0000 | ORAL_TABLET | Freq: Four times a day (QID) | ORAL | 0 refills | Status: DC | PRN

## 2017-10-23 ENCOUNTER — Inpatient Hospital Stay: Payer: Medicare Other | Attending: Hematology & Oncology

## 2017-10-23 ENCOUNTER — Encounter: Payer: Self-pay | Admitting: Hematology & Oncology

## 2017-10-23 ENCOUNTER — Inpatient Hospital Stay: Payer: Medicare Other

## 2017-10-23 ENCOUNTER — Other Ambulatory Visit: Payer: Self-pay

## 2017-10-23 ENCOUNTER — Inpatient Hospital Stay (HOSPITAL_BASED_OUTPATIENT_CLINIC_OR_DEPARTMENT_OTHER): Payer: Medicare Other | Admitting: Hematology & Oncology

## 2017-10-23 VITALS — BP 135/62 | HR 63 | Temp 97.5°F | Resp 17 | Wt 159.0 lb

## 2017-10-23 DIAGNOSIS — C3491 Malignant neoplasm of unspecified part of right bronchus or lung: Secondary | ICD-10-CM | POA: Diagnosis not present

## 2017-10-23 DIAGNOSIS — D509 Iron deficiency anemia, unspecified: Secondary | ICD-10-CM | POA: Diagnosis not present

## 2017-10-23 DIAGNOSIS — D500 Iron deficiency anemia secondary to blood loss (chronic): Secondary | ICD-10-CM

## 2017-10-23 LAB — CBC WITH DIFFERENTIAL (CANCER CENTER ONLY)
Basophils Absolute: 0 10*3/uL (ref 0.0–0.1)
Basophils Relative: 1 %
EOS ABS: 0.2 10*3/uL (ref 0.0–0.5)
EOS PCT: 4 %
HCT: 37.1 % — ABNORMAL LOW (ref 38.7–49.9)
Hemoglobin: 12.4 g/dL — ABNORMAL LOW (ref 13.0–17.1)
LYMPHS ABS: 0.8 10*3/uL — AB (ref 0.9–3.3)
LYMPHS PCT: 13 %
MCH: 32 pg (ref 28.0–33.4)
MCHC: 33.4 g/dL (ref 32.0–35.9)
MCV: 95.6 fL (ref 82.0–98.0)
MONO ABS: 0.6 10*3/uL (ref 0.1–0.9)
MONOS PCT: 10 %
Neutro Abs: 4.2 10*3/uL (ref 1.5–6.5)
Neutrophils Relative %: 72 %
PLATELETS: 151 10*3/uL (ref 145–400)
RBC: 3.88 MIL/uL — AB (ref 4.20–5.70)
RDW: 13.6 % (ref 11.1–15.7)
WBC: 5.8 10*3/uL (ref 4.0–10.0)

## 2017-10-23 LAB — CMP (CANCER CENTER ONLY)
ALK PHOS: 62 U/L (ref 26–84)
ALT: 15 U/L (ref 10–47)
ANION GAP: 2 — AB (ref 5–15)
AST: 19 U/L (ref 11–38)
Albumin: 3.7 g/dL (ref 3.5–5.0)
BILIRUBIN TOTAL: 0.7 mg/dL (ref 0.2–1.6)
BUN: 10 mg/dL (ref 7–22)
CO2: 32 mmol/L (ref 18–33)
Calcium: 9.2 mg/dL (ref 8.0–10.3)
Chloride: 100 mmol/L (ref 98–108)
Creatinine: 0.7 mg/dL (ref 0.60–1.20)
Glucose, Bld: 99 mg/dL (ref 73–118)
POTASSIUM: 4.2 mmol/L (ref 3.3–4.7)
Sodium: 134 mmol/L (ref 128–145)
TOTAL PROTEIN: 6.2 g/dL — AB (ref 6.4–8.1)

## 2017-10-23 LAB — IRON AND TIBC
IRON: 69 ug/dL (ref 42–163)
Saturation Ratios: 31 % — ABNORMAL LOW (ref 42–163)
TIBC: 220 ug/dL (ref 202–409)
UIBC: 151 ug/dL

## 2017-10-23 LAB — FERRITIN: Ferritin: 278 ng/mL (ref 24–336)

## 2017-10-23 LAB — RETICULOCYTES
RBC.: 3.93 MIL/uL — AB (ref 4.20–5.82)
RETIC CT PCT: 0.8 % (ref 0.8–1.8)
Retic Count, Absolute: 31.4 10*3/uL — ABNORMAL LOW (ref 34.8–93.9)

## 2017-10-23 NOTE — Progress Notes (Signed)
Hematology and Oncology Follow Up Visit  Bradley Meadows [id number] Nov 19, 1940 77 y.o. 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  He seems to be doing a little bit better.  His weight is still down a little bit.  His voice sounds stronger.  He has had no problems with nausea or vomiting.  He has had no problems with bowels or bladder.  There is been no rashes.  He has had no leg swelling.  He is mostly at home.  He does not do a whole lot.  He has had no bleeding.  He has had no cough.  Is been no hemoptysis.  He does have cardiac issues.  He had an echocardiogram that was done back in June or July.  This showed an ejection fraction of 40-45%.  I am sure the cardiologist is watching him.  Overall, he does seem to be making progress.  We probably do not need to do a PET scan until November to see how things are going.  Currently, his performance status is ECOG 1.  Medications:  Allergies as of 10/23/2017      Reactions   No Known Allergies       Medication List        Accurate as of 10/23/17  3:19 PM. Always use your most recent med list.          aspirin EC 81 MG tablet Take 81 mg by mouth daily.   atorvastatin 40 MG tablet Commonly known as:  LIPITOR TAKE 1 TABLET(40 MG) BY MOUTH DAILY   CARAFATE 1 GM/10ML suspension Generic drug:  sucralfate Take 10 mLs (1 g total) by mouth 2 (two) times daily.   HYDROcodone-acetaminophen 5-325 MG tablet Commonly known as:  NORCO/VICODIN Take 1 tablet by mouth every 6 (six) hours as needed for severe pain.     ipratropium-albuterol 0.5-2.5 (3) MG/3ML Soln Commonly known as:  DUONEB USE 1 VIAL VIA NEBULIZER EVERY 6 HOURS AS NEEDED FOR SHORTNESS OF BREATH OR WHEEZING   Ipratropium-Albuterol 20-100 MCG/ACT Aers respimat Commonly known as:  COMBIVENT INHALE 1 PUFF BY MOUTH EVERY 6 HOURS AS NEEDED FOR WHEEZING   meclizine 12.5 MG tablet Commonly known as:  ANTIVERT TAKE 1 TABLET(12.5 MG) BY MOUTH THREE TIMES DAILY AS NEEDED FOR DIZZINESS   metoprolol tartrate 25 MG tablet Commonly known as:  LOPRESSOR TAKE 1/2 TABLELET(12.5 MG) BY MOUTH TWICE DAILY   multivitamin tablet Take 1 tablet by mouth daily. Centrum Silver   omeprazole 20 MG capsule Commonly known as:  PRILOSEC Take 1 capsule (20 mg total) by mouth daily.   Tiotropium Bromide-Olodaterol 2.5-2.5 MCG/ACT Aers Inhale 2 puffs into the lungs daily.   vitamin B-6 250 MG tablet Take 1 tablet (250 mg total) by mouth daily.       Allergies:  Allergies  Allergen Reactions  . No Known Allergies     Past Medical History, Surgical history, Social history, and Family History were reviewed and updated.  Review of Systems: Review of Systems  Constitutional: Positive for malaise/fatigue.  HENT: Negative.  Eyes: Negative.   Respiratory: Positive for cough and shortness of breath.   Cardiovascular: Positive for chest pain and leg swelling.  Gastrointestinal: Positive for abdominal pain and constipation.  Genitourinary: Positive for dysuria and hematuria.  Musculoskeletal: Positive for joint pain and neck pain.  Skin: Negative.   Neurological: Positive for dizziness, tingling and focal weakness.  Endo/Heme/Allergies: Negative.   Psychiatric/Behavioral: Negative.       Physical Exam:  weight is 159 lb (72.1 kg). His oral temperature is 97.5 F (36.4 C) (abnormal). His blood pressure is 135/62 and his pulse is 63. His respiration is 17 and oxygen saturation is 93%.   Wt Readings from Last 3 Encounters:  10/23/17 159 lb (72.1  kg)  10/09/17 163 lb (73.9 kg)  09/30/17 162 lb 3.2 oz (73.6 kg)    Physical Exam  Constitutional: He is oriented to person, place, and time.  HENT:  Head: Normocephalic and atraumatic.  Mouth/Throat: Oropharynx is clear and moist.  Eyes: Pupils are equal, round, and reactive to light. EOM are normal.  Neck: Normal range of motion.  Cardiovascular: Normal rate, regular rhythm and normal heart sounds.  Pulmonary/Chest: Effort normal and breath sounds normal.  Abdominal: Soft. Bowel sounds are normal.  Musculoskeletal: Normal range of motion. He exhibits no edema, tenderness or deformity.  Lymphadenopathy:    He has no cervical adenopathy.  Neurological: He is alert and oriented to person, place, and time.  Skin: Skin is warm and dry. No rash noted. No erythema.  Psychiatric: He has a normal mood and affect. His behavior is normal. Judgment and thought content normal.  Vitals reviewed.    Lab Results  Component Value Date   WBC 5.8 10/23/2017   HGB 12.4 (L) 10/23/2017   HCT 37.1 (L) 10/23/2017   MCV 95.6 10/23/2017   PLT 151 10/23/2017   Lab Results  Component Value Date   FERRITIN 278 10/23/2017   IRON 69 10/23/2017   TIBC 220 10/23/2017   UIBC 151 10/23/2017   IRONPCTSAT 31 (L) 10/23/2017   Lab Results  Component Value Date   RETICCTPCT 0.8 10/23/2017   RBC 3.93 (L) 10/23/2017   RBC 3.88 (L) 10/23/2017   No results found for: KPAFRELGTCHN, LAMBDASER, KAPLAMBRATIO No results found for: Kandis Cocking, IGMSERUM No results found for: Odetta Pink, SPEI   Chemistry      Component Value Date/Time   NA 134 10/23/2017 1011   NA 133 02/06/2017 0757   NA 129 (L) 01/23/2017 1050   K 4.2 10/23/2017 1011   K 3.9 02/06/2017 0757   K 3.4 (L) 01/23/2017 1050   CL 100 10/23/2017 1011   CL 90 (L) 02/06/2017 0757   CO2 32 10/23/2017 1011   CO2 29 02/06/2017 0757   CO2 26 01/23/2017 1050   BUN 10 10/23/2017 1011   BUN  8 02/06/2017 0757   BUN 12.4 01/23/2017 1050   CREATININE 0.70 10/23/2017 1011   CREATININE 0.72 10/09/2017 1053   CREATININE 0.8 01/23/2017 1050      Component Value Date/Time   CALCIUM 9.2 10/23/2017 1011   CALCIUM 9.3 02/06/2017 0757   CALCIUM 8.9 01/23/2017 1050   ALKPHOS 62 10/23/2017 1011   ALKPHOS 79 02/06/2017 0757   ALKPHOS 80 01/23/2017 1050   AST 19 10/23/2017 1011   AST 39 (H) 01/23/2017 1050   ALT 15 10/23/2017 1011   ALT 43 02/06/2017 0757   ALT 27 01/23/2017 1050   BILITOT 0.7 10/23/2017  1011   BILITOT 0.51 01/23/2017 1050      Impression and Plan: Mr. Bolle is a very pleasant 77 yo caucasian gentleman with locally advanced squamous cell carcinoma of the lung. He initially received chemotherapy and radiation.  We then used Durvalumab according to the [facility] clinical trial.  Again, I am glad that his quality of life is getting a little bit better.  This is always been important for him.  I will set up a PET scan for November.  We will then plan to see him back about a week or so afterwards.  Of note, he did have iron deficiency.  We did give him some IV iron.  It is hard to say if this really helped him.  [name]    Volanda Napoleon, MD 9/20/20193:19 PM

## 2017-10-25 IMAGING — XA IR FLUORO GUIDE CV LINE*R*
1 series · 1 of 1 positions shown · non-contrast
Comparison: None.

INDICATION: Metastatic squamous cell carcinoma. Request for Port-A-Cath
placement and gastrostomy tube prior to treatment. Patient has
recent compression on the esophagus from a subcarinal mass.

EXAM:
FLUOROSCOPIC AND ULTRASOUND GUIDED PLACEMENT OF A SUBCUTANEOUS PORT
FLUOROSCOPY FOR ATTEMPTED GASTROSTOMY TUBE PLACEMENT

[Series 300: line placements · 1 of 1 slices shown]
[im 1/1]
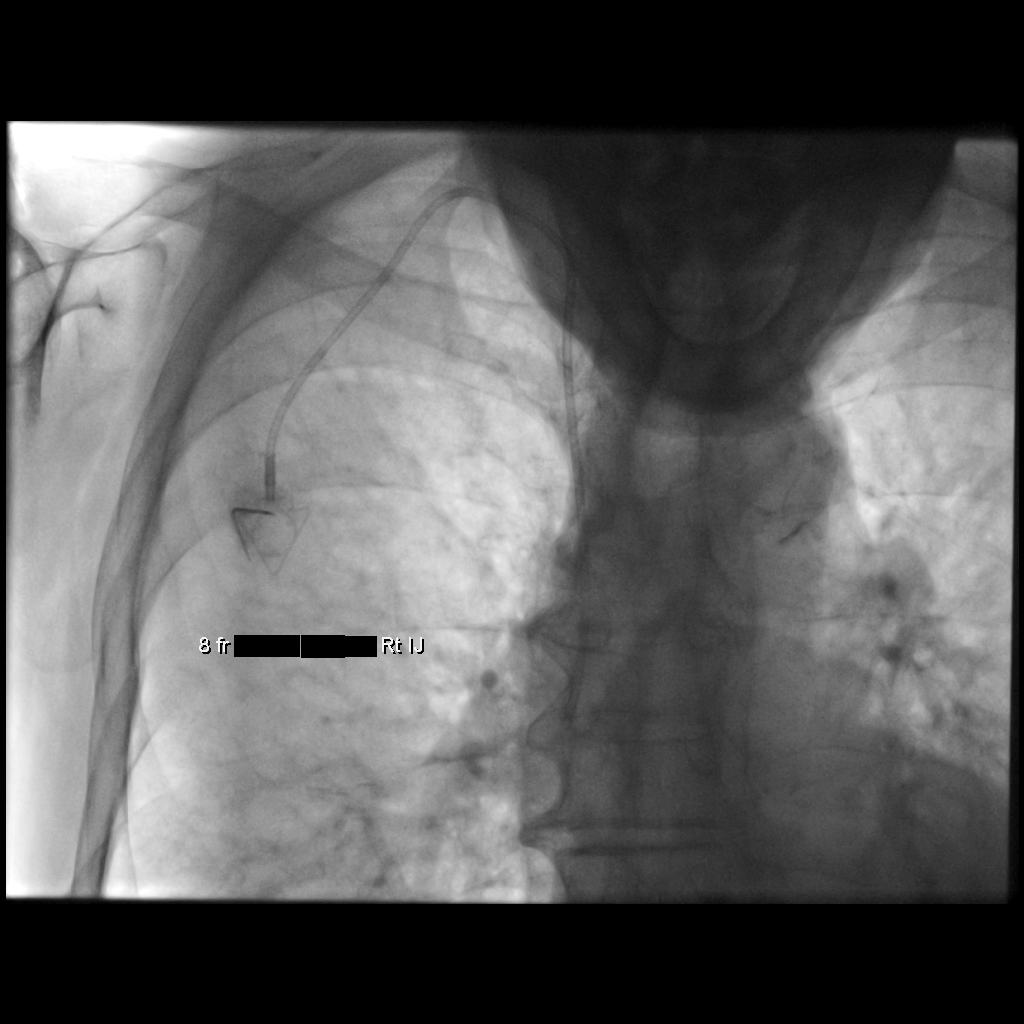

[1 of 1 positions shown; findings below may reference images not displayed]

MEDICATIONS:
Ancef 2 g; The antibiotic was administered within an appropriate
time interval prior to skin puncture.

Glucagon 1 mg

ANESTHESIA/SEDATION:
Versed 4.0 mg IV; Fentanyl 50 mcg IV;

Moderate Sedation Time:

The patient was continuously monitored during the procedure by the
interventional radiology nurse under my direct supervision.

FLUOROSCOPY TIME:  54 seconds, 16.6 mGy

COMPLICATIONS:
None immediate.

PROCEDURE:
The procedure, risks, benefits, and alternatives were explained to
the patient. Questions regarding the procedure were encouraged and
answered. The patient understands and consents to the procedure.

Patient was placed supine on the interventional table. Ultrasound
confirmed a patent right internal jugular vein. The right chest and
neck were cleaned with a skin antiseptic and a sterile drape was
placed. Maximal barrier sterile technique was utilized including
caps, mask, sterile gowns, sterile gloves, sterile drape, hand
hygiene and skin antiseptic. The right neck was anesthetized with 1%
lidocaine. Small incision was made in the right neck with a blade.
Micropuncture set was placed in the right internal jugular vein with
ultrasound guidance. The micropuncture wire was used for measurement
purposes. The right chest was anesthetized with 1% lidocaine with
epinephrine. #15 blade was used to make an incision and a
subcutaneous port pocket was formed. 8 french Power Port was
assembled. Subcutaneous tunnel was formed with a stiff tunneling
device. The port catheter was brought through the subcutaneous
tunnel. The port was placed in the subcutaneous pocket. The
micropuncture set was exchanged for a peel-away sheath. The catheter
was placed through the peel-away sheath and the tip was positioned
at the superior cavoatrial junction. Catheter placement was
confirmed with fluoroscopy. The port was accessed and flushed with
heparinized saline. The port pocket was closed using two layers of
absorbable sutures and Dermabond. The vein skin site was closed
using a single layer of absorbable suture and Dermabond. Sterile
dressings were applied. Patient tolerated the procedure well without
an immediate complication. Ultrasound and fluoroscopic images were
taken and saved for this procedure.

Attention was directed to placement of a gastrostomy tube. A 5
French catheter and Bentson wire were advanced from the mouth to the
stomach without difficulty using fluoroscopic guidance. The stomach
was inflated with gas. Unfortunately, there was no barium within the
transverse colon. Stomach is located high in the stomach and has a
transverse orientation. It was very difficult to confirm that no
bowel was anterior to the stomach and previous CT imaging confirm
that there has been bowel in this location previously. As resolve,
percutaneous gastrostomy tube was not attempted.
IMPRESSION: Placement of a subcutaneous port device. Catheter tip at the
superior cavoatrial junction.

Gastrostomy tube placement was aborted due to poor visualization of
the transverse colon and high position of the stomach. Will discuss
with the patient's oncologist about scheduling an other gastrostomy
tube procedure.

## 2017-11-03 ENCOUNTER — Other Ambulatory Visit: Payer: Self-pay | Admitting: Hematology & Oncology

## 2017-11-25 ENCOUNTER — Telehealth: Payer: Self-pay | Admitting: Family Medicine

## 2017-11-25 DIAGNOSIS — K4030 Unilateral inguinal hernia, with obstruction, without gangrene, not specified as recurrent: Secondary | ICD-10-CM

## 2017-11-25 NOTE — Telephone Encounter (Signed)
Patient is requesting a refill on Hydrocodone   LOV: 10/09/17  LRF:   10/20/17

## 2017-11-26 MED ORDER — HYDROCODONE-ACETAMINOPHEN 5-325 MG PO TABS: 1.0000 | ORAL_TABLET | Freq: Four times a day (QID) | ORAL | 0 refills | Status: DC | PRN

## 2017-11-30 ENCOUNTER — Other Ambulatory Visit: Payer: Self-pay | Admitting: Family Medicine

## 2017-12-02 ENCOUNTER — Other Ambulatory Visit: Payer: Self-pay | Admitting: Family Medicine

## 2017-12-02 DIAGNOSIS — G5602 Carpal tunnel syndrome, left upper limb: Secondary | ICD-10-CM | POA: Diagnosis not present

## 2017-12-02 DIAGNOSIS — G5601 Carpal tunnel syndrome, right upper limb: Secondary | ICD-10-CM | POA: Diagnosis not present

## 2017-12-02 MED ORDER — IPRATROPIUM-ALBUTEROL 20-100 MCG/ACT IN AERS: INHALATION_SPRAY | RESPIRATORY_TRACT | 5 refills | Status: DC

## 2017-12-02 MED ORDER — IPRATROPIUM-ALBUTEROL 0.5-2.5 (3) MG/3ML IN SOLN: RESPIRATORY_TRACT | 0 refills | Status: DC

## 2017-12-02 NOTE — Telephone Encounter (Signed)
rx sent to pharmacy

## 2017-12-02 NOTE — Telephone Encounter (Signed)
Patient needs albuterol sent to [facility]  [phone] if any questions

## 2017-12-03 ENCOUNTER — Other Ambulatory Visit: Payer: Self-pay | Admitting: Family Medicine

## 2017-12-07 ENCOUNTER — Other Ambulatory Visit: Payer: Self-pay | Admitting: Family Medicine

## 2017-12-11 ENCOUNTER — Ambulatory Visit (HOSPITAL_COMMUNITY)
Admission: RE | Admit: 2017-12-11 | Discharge: 2017-12-11 | Disposition: A | Discharge status: Home / Self Care | Payer: Medicare Other | Source: Ambulatory Visit | Attending: Hematology & Oncology | Admitting: Hematology & Oncology

## 2017-12-11 DIAGNOSIS — C3491 Malignant neoplasm of unspecified part of right bronchus or lung: Secondary | ICD-10-CM | POA: Insufficient documentation

## 2017-12-11 DIAGNOSIS — C3490 Malignant neoplasm of unspecified part of unspecified bronchus or lung: Secondary | ICD-10-CM | POA: Diagnosis not present

## 2017-12-11 LAB — GLUCOSE, CAPILLARY: GLUCOSE-CAPILLARY: 103 mg/dL — AB (ref 70–99)

## 2017-12-11 MED ORDER — FLUDEOXYGLUCOSE F - 18 (FDG) INJECTION
8.4300 | Freq: Once | INTRAVENOUS | Status: AC
  Administered 2017-12-11: 8.43 via INTRAVENOUS

## 2017-12-14 DIAGNOSIS — G5601 Carpal tunnel syndrome, right upper limb: Secondary | ICD-10-CM | POA: Diagnosis not present

## 2017-12-18 ENCOUNTER — Inpatient Hospital Stay: Payer: Medicare Other

## 2017-12-18 ENCOUNTER — Telehealth: Payer: Self-pay | Admitting: *Deleted

## 2017-12-18 ENCOUNTER — Other Ambulatory Visit: Payer: Self-pay

## 2017-12-18 ENCOUNTER — Encounter: Payer: Self-pay | Admitting: Hematology & Oncology

## 2017-12-18 ENCOUNTER — Inpatient Hospital Stay: Payer: Medicare Other | Attending: Hematology & Oncology | Admitting: Hematology & Oncology

## 2017-12-18 VITALS — BP 121/56 | HR 74 | Temp 98.0°F | Resp 23 | Ht 66.0 in | Wt 161.8 lb

## 2017-12-18 VITALS — BP 121/56 | HR 74 | Temp 98.0°F | Resp 23 | Wt 161.8 lb

## 2017-12-18 DIAGNOSIS — E032 Hypothyroidism due to medicaments and other exogenous substances: Secondary | ICD-10-CM

## 2017-12-18 DIAGNOSIS — C3491 Malignant neoplasm of unspecified part of right bronchus or lung: Secondary | ICD-10-CM | POA: Diagnosis not present

## 2017-12-18 DIAGNOSIS — D500 Iron deficiency anemia secondary to blood loss (chronic): Secondary | ICD-10-CM

## 2017-12-18 DIAGNOSIS — D509 Iron deficiency anemia, unspecified: Secondary | ICD-10-CM | POA: Diagnosis not present

## 2017-12-18 DIAGNOSIS — Z95828 Presence of other vascular implants and grafts: Secondary | ICD-10-CM

## 2017-12-18 LAB — CMP (CANCER CENTER ONLY)
ALT: 13 U/L (ref 10–47)
ANION GAP: 8 (ref 5–15)
AST: 21 U/L (ref 11–38)
Albumin: 3.6 g/dL (ref 3.5–5.0)
Alkaline Phosphatase: 71 U/L (ref 26–84)
BUN: 12 mg/dL (ref 7–22)
CALCIUM: 8.8 mg/dL (ref 8.0–10.3)
CHLORIDE: 97 mmol/L — AB (ref 98–108)
CO2: 30 mmol/L (ref 18–33)
Creatinine: 0.6 mg/dL (ref 0.60–1.20)
Glucose, Bld: 82 mg/dL (ref 73–118)
Potassium: 3.7 mmol/L (ref 3.3–4.7)
Sodium: 135 mmol/L (ref 128–145)
Total Bilirubin: 0.8 mg/dL (ref 0.2–1.6)
Total Protein: 6.1 g/dL — ABNORMAL LOW (ref 6.4–8.1)

## 2017-12-18 LAB — CBC WITH DIFFERENTIAL (CANCER CENTER ONLY)
Abs Immature Granulocytes: 0.01 10*3/uL (ref 0.00–0.07)
BASOS ABS: 0 10*3/uL (ref 0.0–0.1)
Basophils Relative: 1 %
EOS ABS: 0.3 10*3/uL (ref 0.0–0.5)
Eosinophils Relative: 5 %
HCT: 36.2 % — ABNORMAL LOW (ref 39.0–52.0)
Hemoglobin: 11.7 g/dL — ABNORMAL LOW (ref 13.0–17.0)
IMMATURE GRANULOCYTES: 0 %
LYMPHS ABS: 0.8 10*3/uL (ref 0.7–4.0)
Lymphocytes Relative: 14 %
MCH: 31.6 pg (ref 26.0–34.0)
MCHC: 32.3 g/dL (ref 30.0–36.0)
MCV: 97.8 fL (ref 80.0–100.0)
Monocytes Absolute: 0.5 10*3/uL (ref 0.1–1.0)
Monocytes Relative: 10 %
NEUTROS PCT: 70 %
NRBC: 0 % (ref 0.0–0.2)
Neutro Abs: 4 10*3/uL (ref 1.7–7.7)
PLATELETS: 147 10*3/uL — AB (ref 150–400)
RBC: 3.7 MIL/uL — AB (ref 4.22–5.81)
RDW: 12.9 % (ref 11.5–15.5)
WBC Count: 5.7 10*3/uL (ref 4.0–10.5)

## 2017-12-18 LAB — LACTATE DEHYDROGENASE: LDH: 162 U/L (ref 98–192)

## 2017-12-18 MED ORDER — SODIUM CHLORIDE 0.9% FLUSH
10.0000 mL | Freq: Once | INTRAVENOUS | Status: AC
  Administered 2017-12-18: 10 mL via INTRAVENOUS
  Filled 2017-12-18: qty 10

## 2017-12-18 MED ORDER — HEPARIN SOD (PORK) LOCK FLUSH 100 UNIT/ML IV SOLN
500.0000 [IU] | Freq: Once | INTRAVENOUS | Status: AC
  Administered 2017-12-18: 500 [IU] via INTRAVENOUS
  Filled 2017-12-18: qty 5

## 2017-12-18 NOTE — Progress Notes (Signed)
Hematology and Oncology Follow Up Visit  EULAS SCHWEITZER [id number] 19-Oct-1940 77 y.o. 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 Spadaccini is here today with his daughter for follow-up.  He is doing quite well.  He now is out from radiation by a year and a half.  We did do a PET scan on him.  This was done on 12/11/2017.  Thankfully, the PET scan did not show any evidence of recurrent/metastatic lung cancer.  Problem now is that he needs bilateral carpal tunnel surgery.  This will be done by Dr. Levora Dredge.  They are not sure when the first surgery will be done.  He still has a Port-A-Cath in.  We we will keep this in.  Another big issue for him is that he is unsteady on his feet.  He is worried about trying to get into his shower.  He is a little bit unsteady.  As such, I think that he will need to have a shower transfer chair.  This will make it much safer for him when he gets into his shower.  He does not need to fall as he is a significant risk for hip fracture.  I talked to he and his daughter about the shower transfer chair.  They are in agreement to getting one ordered.  He did have some transient iron deficiency.  When I saw him, his iron was on the low side.  We did go ahead and give him a dose of IV iron.  This did make him feel better.  He still has some swallowing difficulties.  He still has some hoarseness.  This does seem to be getting a little bit better.  He is not having any cardiac issues.  Thankfully, this is a thing of the past.  Overall, his performance status is ECOG 2.    Medications:  Allergies as of 12/18/2017    Reactions   No Known Allergies       Medication List        Accurate as of 12/18/17 12:05 PM. Always use your most recent med list.          aspirin EC 81 MG tablet Take 81 mg by mouth daily.   atorvastatin 40 MG tablet Commonly known as:  LIPITOR TAKE 1 TABLET(40 MG) BY MOUTH DAILY   CARAFATE 1 GM/10ML suspension Generic drug:  sucralfate Take 10 mLs (1 g total) by mouth 2 (two) times daily.   HYDROcodone-acetaminophen 5-325 MG tablet Commonly known as:  NORCO/VICODIN Take 1 tablet by mouth every 6 (six) hours as needed for severe pain.   Ipratropium-Albuterol 20-100 MCG/ACT Aers respimat Commonly known as:  COMBIVENT INHALE 1 PUFF BY MOUTH EVERY 6 HOURS AS NEEDED FOR WHEEZING   ipratropium-albuterol 0.5-2.5 (3) MG/3ML Soln Commonly known as:  DUONEB USE 1 VIAL VIA NEBULIZER EVERY 6 HOURS AS NEEDED FOR SHORTNESS OF BREATH OR WHEEZING   meclizine 12.5 MG tablet Commonly known as:  ANTIVERT TAKE 1 TABLET(12.5 MG) BY MOUTH THREE TIMES DAILY AS NEEDED FOR DIZZINESS   multivitamin tablet Take 1 tablet by mouth daily. Centrum Silver  omeprazole 20 MG capsule Commonly known as:  PRILOSEC Take 1 capsule (20 mg total) by mouth daily.   STIOLTO RESPIMAT 2.5-2.5 MCG/ACT Aers Generic drug:  Tiotropium Bromide-Olodaterol INHALE 2 PUFFS INTO THE LUNGS DAILY   vitamin B-6 250 MG tablet Take 1 tablet (250 mg total) by mouth daily.       Allergies:  Allergies  Allergen Reactions  . No Known Allergies     Past Medical History, Surgical history, Social history, and Family History were reviewed and updated.  Review of Systems: Review of Systems  Constitutional: Positive for malaise/fatigue.  HENT: Negative.   Eyes: Negative.   Respiratory: Positive for cough and shortness of breath.   Cardiovascular: Positive for chest pain and leg swelling.  Gastrointestinal: Positive for abdominal pain and constipation.  Genitourinary: Positive for dysuria and hematuria.    Musculoskeletal: Positive for joint pain and neck pain.  Skin: Negative.   Neurological: Positive for dizziness, tingling and focal weakness.  Endo/Heme/Allergies: Negative.   Psychiatric/Behavioral: Negative.       Physical Exam:  weight is 161 lb 13 oz (73.4 kg). His oral temperature is 98 F (36.7 C). His blood pressure is 121/56 (abnormal) and his pulse is 74. His respiration is 23 (abnormal).   Wt Readings from Last 3 Encounters:  12/18/17 161 lb 13 oz (73.4 kg)  12/18/17 161 lb 13 oz (73.4 kg)  10/23/17 159 lb (72.1 kg)    Physical Exam  Constitutional: He is oriented to person, place, and time.  HENT:  Head: Normocephalic and atraumatic.  Mouth/Throat: Oropharynx is clear and moist.  Eyes: Pupils are equal, round, and reactive to light. EOM are normal.  Neck: Normal range of motion.  Cardiovascular: Normal rate, regular rhythm and normal heart sounds.  Pulmonary/Chest: Effort normal and breath sounds normal.  Abdominal: Soft. Bowel sounds are normal.  Musculoskeletal: Normal range of motion. He exhibits no edema, tenderness or deformity.  Lymphadenopathy:    He has no cervical adenopathy.  Neurological: He is alert and oriented to person, place, and time.  Skin: Skin is warm and dry. No rash noted. No erythema.  Psychiatric: He has a normal mood and affect. His behavior is normal. Judgment and thought content normal.  Vitals reviewed.    Lab Results  Component Value Date   WBC 5.7 12/18/2017   HGB 11.7 (L) 12/18/2017   HCT 36.2 (L) 12/18/2017   MCV 97.8 12/18/2017   PLT 147 (L) 12/18/2017   Lab Results  Component Value Date   FERRITIN 278 10/23/2017   IRON 69 10/23/2017   TIBC 220 10/23/2017   UIBC 151 10/23/2017   IRONPCTSAT 31 (L) 10/23/2017   Lab Results  Component Value Date   RETICCTPCT 0.8 10/23/2017   RBC 3.70 (L) 12/18/2017   No results found for: KPAFRELGTCHN, LAMBDASER, KAPLAMBRATIO No results found for: IGGSERUM, IGA, IGMSERUM No  results found for: Odetta Pink, SPEI   Chemistry      Component Value Date/Time   NA 135 12/18/2017 1053   NA 133 02/06/2017 0757   NA 129 (L) 01/23/2017 1050   K 3.7 12/18/2017 1053   K 3.9 02/06/2017 0757   K 3.4 (L) 01/23/2017 1050   CL 97 (L) 12/18/2017 1053   CL 90 (L) 02/06/2017 0757   CO2 30 12/18/2017 1053   CO2 29 02/06/2017 0757   CO2 26 01/23/2017 1050   BUN 12 12/18/2017 1053   BUN 8 02/06/2017 0757  BUN 12.4 01/23/2017 1050   CREATININE 0.60 12/18/2017 1053   CREATININE 0.72 10/09/2017 1053   CREATININE 0.8 01/23/2017 1050      Component Value Date/Time   CALCIUM 8.8 12/18/2017 1053   CALCIUM 9.3 02/06/2017 0757   CALCIUM 8.9 01/23/2017 1050   ALKPHOS 71 12/18/2017 1053   ALKPHOS 79 02/06/2017 0757   ALKPHOS 80 01/23/2017 1050   AST 21 12/18/2017 1053   AST 39 (H) 01/23/2017 1050   ALT 13 12/18/2017 1053   ALT 43 02/06/2017 0757   ALT 27 01/23/2017 1050   BILITOT 0.8 12/18/2017 1053   BILITOT 0.51 01/23/2017 1050      Impression and Plan: Mr. Mccollum is a very pleasant 77 yo caucasian gentleman with locally advanced squamous cell carcinoma of the lung. He initially received chemotherapy and radiation.  We then used Durvalumab according to the PACIFIC clinical trial.  We will plan to see him back in 4 months now.  I think this is reasonable.  We will get a PET scan before we see him back.  Again, we will keep the Port-A-Cath in.  I think this would be quite helpful.  I am just glad that his quality of life is improving.  He really had a tough time with the radiation and chemotherapy.  Even the immunotherapy cause problems.  I think that giving him the shower transfer chair will be quite beneficial and allow him to have safety and confidence when he showers.      Volanda Napoleon, MD 11/15/201912:05 PM

## 2017-12-18 NOTE — Telephone Encounter (Signed)
   [facility] Pre-operative Risk Assessment    Request for surgical clearance:  1. What type of surgery is being performed? RIGHT CARPAL TUNNEL RELEASE   2. When is this surgery scheduled? TBD   3. What type of clearance is required (medical clearance vs. Pharmacy clearance to hold med vs. Both)? MEDICAL   4. Are there any medications that need to be held prior to surgery and how long? NONE   5. Practice name and name of physician performing surgery? [facility]  6. What is your office phone number  [phone]   7.   What is your office fax number   [phone]   ATTN: KATHY BLUME  8.   Anesthesia type (None, local, MAC, general) ? CHOICE    Eryx Zane M 12/18/2017, 10:46 AM  _________________________________________________________________   (provider comments below)

## 2017-12-21 ENCOUNTER — Other Ambulatory Visit: Payer: Self-pay | Admitting: *Deleted

## 2017-12-21 ENCOUNTER — Telehealth: Payer: Self-pay | Admitting: Cardiovascular Disease

## 2017-12-21 DIAGNOSIS — C3491 Malignant neoplasm of unspecified part of right bronchus or lung: Secondary | ICD-10-CM

## 2017-12-21 NOTE — Telephone Encounter (Signed)
  Patient was returning a call from [place]. She stated it may be in regards to medical clearance request

## 2017-12-21 NOTE — Telephone Encounter (Signed)
   Primary Cardiologist: Sanda Klein, MD  Chart reviewed as part of pre-operative protocol coverage. Patient was contacted 11-03-2017 in reference to pre-operative risk assessment for pending surgery as outlined below.  VIPUL CAFARELLI was last seen on 09/30/2017 by Almyra Deforest PA-C.  Since that day, EVREN SHANKLAND has done well from cardiac perspective without any chest pain.  He has chronic dyspnea on exertion which is unchanged.  This is related to pulmonary issue.  He had a low risk stress test in 2018.  Therefore, based on ACC/AHA guidelines, the patient would be at acceptable risk for the planned procedure without further cardiovascular testing.   I will route this recommendation to the requesting party via Epic fax function and remove from pre-op pool.  Please call with questions. Note: Although this clearance letter is for right carpal tunnel release, patient may also require a left carpal tunnel release later. Carpal tunnel release in general is not considered a high risk surgery.  If both procedure are within 53-month from now, then patient does not need another clearance.  Almyra Deforest, [place] 11-03-2017, 3:37 PM

## 2017-12-23 ENCOUNTER — Other Ambulatory Visit: Payer: Self-pay | Admitting: *Deleted

## 2017-12-23 MED ORDER — TRANSFER BENCH MISC: 0 refills | Status: AC

## 2017-12-23 MED ORDER — TRANSFER BENCH MISC: 0 refills | Status: DC

## 2017-12-23 NOTE — Progress Notes (Signed)
transfer

## 2017-12-27 ENCOUNTER — Other Ambulatory Visit: Payer: Self-pay | Admitting: Hematology & Oncology

## 2017-12-29 ENCOUNTER — Other Ambulatory Visit: Payer: Self-pay | Admitting: Family Medicine

## 2017-12-29 DIAGNOSIS — K4030 Unilateral inguinal hernia, with obstruction, without gangrene, not specified as recurrent: Secondary | ICD-10-CM

## 2017-12-29 MED ORDER — HYDROCODONE-ACETAMINOPHEN 5-325 MG PO TABS: 1.0000 | ORAL_TABLET | Freq: Four times a day (QID) | ORAL | 0 refills | Status: DC | PRN

## 2017-12-29 NOTE — Telephone Encounter (Signed)
Patient is requesting a refill on Hydrocodone   LOV: 10/09/17  LRF:   11/26/17

## 2018-01-26 ENCOUNTER — Other Ambulatory Visit: Payer: Self-pay | Admitting: Hematology & Oncology

## 2018-01-26 ENCOUNTER — Other Ambulatory Visit: Payer: Self-pay | Admitting: Family Medicine

## 2018-01-26 DIAGNOSIS — M7989 Other specified soft tissue disorders: Secondary | ICD-10-CM

## 2018-02-01 ENCOUNTER — Other Ambulatory Visit: Payer: Self-pay | Admitting: Family Medicine

## 2018-02-01 DIAGNOSIS — K4030 Unilateral inguinal hernia, with obstruction, without gangrene, not specified as recurrent: Secondary | ICD-10-CM

## 2018-02-01 NOTE — Telephone Encounter (Signed)
Pt needs refill on hydrocodone [facility].

## 2018-02-02 MED ORDER — HYDROCODONE-ACETAMINOPHEN 5-325 MG PO TABS: 1.0000 | ORAL_TABLET | Freq: Four times a day (QID) | ORAL | 0 refills | Status: DC | PRN

## 2018-02-02 NOTE — Telephone Encounter (Signed)
Patient is requesting a refill on Hydrocodone   LOV: 10/09/17  LRF: 12/29/17

## 2018-02-17 ENCOUNTER — Inpatient Hospital Stay: Payer: Medicare Other | Attending: Hematology & Oncology

## 2018-02-17 DIAGNOSIS — D509 Iron deficiency anemia, unspecified: Secondary | ICD-10-CM | POA: Insufficient documentation

## 2018-02-17 DIAGNOSIS — Z95828 Presence of other vascular implants and grafts: Secondary | ICD-10-CM

## 2018-02-17 DIAGNOSIS — C3491 Malignant neoplasm of unspecified part of right bronchus or lung: Secondary | ICD-10-CM | POA: Insufficient documentation

## 2018-02-17 MED ORDER — SODIUM CHLORIDE 0.9% FLUSH
10.0000 mL | INTRAVENOUS | Status: DC | PRN
  Administered 2018-02-17: 10 mL via INTRAVENOUS
  Filled 2018-02-17: qty 10

## 2018-02-17 MED ORDER — HEPARIN SOD (PORK) LOCK FLUSH 100 UNIT/ML IV SOLN
500.0000 [IU] | Freq: Once | INTRAVENOUS | Status: AC
  Administered 2018-02-17: 500 [IU] via INTRAVENOUS
  Filled 2018-02-17: qty 5

## 2018-02-17 NOTE — Patient Instructions (Signed)
Implanted Port Insertion, Care After  This sheet gives you information about how to care for yourself after your procedure. Your health care provider may also give you more specific instructions. If you have problems or questions, contact your health care provider.  What can I expect after the procedure?  After the procedure, it is common to have:  · Discomfort at the port insertion site.  · Bruising on the skin over the port. This should improve over 3-4 days.  Follow these instructions at home:  Port care  · After your port is placed, you will get a manufacturer's information card. The card has information about your port. Keep this card with you at all times.  · Take care of the port as told by your health care provider. Ask your health care provider if you or a family member can get training for taking care of the port at home. A home health care nurse may also take care of the port.  · Make sure to remember what type of port you have.  Incision care         · Follow instructions from your health care provider about how to take care of your port insertion site. Make sure you:  ? Wash your hands with soap and water before and after you change your bandage (dressing). If soap and water are not available, use hand sanitizer.  ? Change your dressing as told by your health care provider.  ? Leave stitches (sutures), skin glue, or adhesive strips in place. These skin closures may need to stay in place for 2 weeks or longer. If adhesive strip edges start to loosen and curl up, you may trim the loose edges. Do not remove adhesive strips completely unless your health care provider tells you to do that.  · Check your port insertion site every day for signs of infection. Check for:  ? Redness, swelling, or pain.  ? Fluid or blood.  ? Warmth.  ? Pus or a bad smell.  Activity  · Return to your normal activities as told by your health care provider. Ask your health care provider what activities are safe for you.  · Do not  lift anything that is heavier than 10 lb (4.5 kg), or the limit that you are told, until your health care provider says that it is safe.  General instructions  · Take over-the-counter and prescription medicines only as told by your health care provider.  · Do not take baths, swim, or use a hot tub until your health care provider approves. Ask your health care provider if you may take showers. You may only be allowed to take sponge baths.  · Do not drive for 24 hours if you were given a sedative during your procedure.  · Wear a medical alert bracelet in case of an emergency. This will tell any health care providers that you have a port.  · Keep all follow-up visits as told by your health care provider. This is important.  Contact a health care provider if:  · You cannot flush your port with saline as directed, or you cannot draw blood from the port.  · You have a fever or chills.  · You have redness, swelling, or pain around your port insertion site.  · You have fluid or blood coming from your port insertion site.  · Your port insertion site feels warm to the touch.  · You have pus or a bad smell coming from the port   insertion site.  Get help right away if:  · You have chest pain or shortness of breath.  · You have bleeding from your port that you cannot control.  Summary  · Take care of the port as told by your health care provider. Keep the manufacturer's information card with you at all times.  · Change your dressing as told by your health care provider.  · Contact a health care provider if you have a fever or chills or if you have redness, swelling, or pain around your port insertion site.  · Keep all follow-up visits as told by your health care provider.  This information is not intended to replace advice given to you by your health care provider. Make sure you discuss any questions you have with your health care provider.  Document Released: 11/10/2012 Document Revised: 08/18/2017 Document Reviewed:  08/18/2017  Elsevier Interactive Patient Education © 2019 [employer]

## 2018-02-18 DIAGNOSIS — G5601 Carpal tunnel syndrome, right upper limb: Secondary | ICD-10-CM | POA: Diagnosis not present

## 2018-02-26 DIAGNOSIS — G5601 Carpal tunnel syndrome, right upper limb: Secondary | ICD-10-CM | POA: Diagnosis not present

## 2018-02-26 DIAGNOSIS — Z9889 Other specified postprocedural states: Secondary | ICD-10-CM | POA: Diagnosis not present

## 2018-03-05 ENCOUNTER — Other Ambulatory Visit: Payer: Self-pay | Admitting: Family Medicine

## 2018-03-05 ENCOUNTER — Other Ambulatory Visit: Payer: Self-pay | Admitting: Hematology & Oncology

## 2018-03-16 ENCOUNTER — Other Ambulatory Visit: Payer: Self-pay | Admitting: Family Medicine

## 2018-03-16 DIAGNOSIS — K4030 Unilateral inguinal hernia, with obstruction, without gangrene, not specified as recurrent: Secondary | ICD-10-CM

## 2018-03-16 MED ORDER — HYDROCODONE-ACETAMINOPHEN 5-325 MG PO TABS: 1.0000 | ORAL_TABLET | Freq: Four times a day (QID) | ORAL | 0 refills | Status: DC | PRN

## 2018-03-16 NOTE — Telephone Encounter (Signed)
Patient is requesting a refill on Hydrocodone   LOV: 10/09/17  LRF:   02/02/18

## 2018-03-16 NOTE — Telephone Encounter (Signed)
Refill on hydrocodone to [facility]

## 2018-04-03 ENCOUNTER — Other Ambulatory Visit: Payer: Self-pay | Admitting: Hematology & Oncology

## 2018-04-21 ENCOUNTER — Telehealth: Payer: Self-pay | Admitting: *Deleted

## 2018-04-21 NOTE — Telephone Encounter (Signed)
Message received from patient's daughter Bradley Meadows stating that pt does not wish to have PET scan done at this time.  Call placed back to patient's daughter Bradley Meadows and instructed Bradley Meadows to call and cancel PET scan.  [name] appreciative of call back and will keep scheduled appts on 04/30/18 at this time.

## 2018-04-23 ENCOUNTER — Encounter (HOSPITAL_COMMUNITY): Payer: Medicare Other

## 2018-04-30 ENCOUNTER — Other Ambulatory Visit: Payer: Medicare Other

## 2018-04-30 ENCOUNTER — Ambulatory Visit: Payer: Medicare Other | Admitting: Hematology & Oncology

## 2018-05-20 ENCOUNTER — Other Ambulatory Visit: Payer: Self-pay | Admitting: Family Medicine

## 2018-05-20 ENCOUNTER — Other Ambulatory Visit: Payer: Self-pay | Admitting: Hematology & Oncology

## 2018-05-20 DIAGNOSIS — K4030 Unilateral inguinal hernia, with obstruction, without gangrene, not specified as recurrent: Secondary | ICD-10-CM

## 2018-05-20 MED ORDER — HYDROCODONE-ACETAMINOPHEN 5-325 MG PO TABS: 1.0000 | ORAL_TABLET | Freq: Four times a day (QID) | ORAL | 0 refills | Status: DC | PRN

## 2018-05-20 NOTE — Telephone Encounter (Signed)
Patient is calling to get refill on his hydrocodone  [facility]

## 2018-05-20 NOTE — Telephone Encounter (Signed)
Patient is requesting a refill on Hydrocodone   LOV:10/09/17  LRF:   03/16/18

## 2018-05-24 ENCOUNTER — Other Ambulatory Visit: Payer: Self-pay | Admitting: *Deleted

## 2018-05-24 NOTE — Patient Outreach (Signed)
Outreach call to pt for screening/ insurance referral, spoke with pt, HIPAA verified, pt very difficult to understand over the telephone due to low, raspy, hoarse voice, pt states " I'll try to talk until my voice gives out"  Pt states he lives alone and daughter and son provides assistance. Pt states he has been dealing with cancer for quite some time and has had chemotherapy and radiation in the past.  RN CM explained [facility] program in depth and pt is adamant that he does not want the program citing " maybe in the future".  RN CM mailed successful outreach letter with 24 hour nurse line magnet to pt home.  Jacqlyn Larsen [facility], [place] Coordinator [phone]

## 2018-06-03 ENCOUNTER — Ambulatory Visit (HOSPITAL_COMMUNITY): Payer: Medicare Other

## 2018-06-04 ENCOUNTER — Ambulatory Visit (HOSPITAL_COMMUNITY): Payer: Medicare Other

## 2018-06-06 DIAGNOSIS — R0902 Hypoxemia: Secondary | ICD-10-CM | POA: Diagnosis not present

## 2018-06-06 DIAGNOSIS — R000 Tachycardia, unspecified: Secondary | ICD-10-CM | POA: Diagnosis not present

## 2018-06-06 DIAGNOSIS — I4510 Unspecified right bundle-branch block: Secondary | ICD-10-CM | POA: Diagnosis not present

## 2018-06-06 DIAGNOSIS — I959 Hypotension, unspecified: Secondary | ICD-10-CM | POA: Diagnosis not present

## 2018-06-06 DIAGNOSIS — R410 Disorientation, unspecified: Secondary | ICD-10-CM | POA: Diagnosis not present

## 2018-06-07 ENCOUNTER — Inpatient Hospital Stay (HOSPITAL_COMMUNITY): Payer: Medicare Other

## 2018-06-07 ENCOUNTER — Encounter (HOSPITAL_COMMUNITY): Payer: Self-pay | Admitting: Emergency Medicine

## 2018-06-07 ENCOUNTER — Emergency Department (HOSPITAL_COMMUNITY): Payer: Medicare Other

## 2018-06-07 ENCOUNTER — Other Ambulatory Visit (HOSPITAL_COMMUNITY): Payer: Medicare Other

## 2018-06-07 ENCOUNTER — Other Ambulatory Visit: Payer: Self-pay

## 2018-06-07 ENCOUNTER — Inpatient Hospital Stay (HOSPITAL_COMMUNITY)
Admission: EM | Admit: 2018-06-07 | Discharge: 2018-06-15 | DRG: 280 | Disposition: A | Discharge status: Skilled Nursing Facility | Payer: Medicare Other | Attending: Internal Medicine | Admitting: Internal Medicine

## 2018-06-07 DIAGNOSIS — Z8249 Family history of ischemic heart disease and other diseases of the circulatory system: Secondary | ICD-10-CM

## 2018-06-07 DIAGNOSIS — W010XXA Fall on same level from slipping, tripping and stumbling without subsequent striking against object, initial encounter: Secondary | ICD-10-CM | POA: Diagnosis present

## 2018-06-07 DIAGNOSIS — Z720 Tobacco use: Secondary | ICD-10-CM | POA: Diagnosis present

## 2018-06-07 DIAGNOSIS — K219 Gastro-esophageal reflux disease without esophagitis: Secondary | ICD-10-CM | POA: Diagnosis present

## 2018-06-07 DIAGNOSIS — Z7951 Long term (current) use of inhaled steroids: Secondary | ICD-10-CM | POA: Diagnosis not present

## 2018-06-07 DIAGNOSIS — I4819 Other persistent atrial fibrillation: Secondary | ICD-10-CM | POA: Diagnosis not present

## 2018-06-07 DIAGNOSIS — C3491 Malignant neoplasm of unspecified part of right bronchus or lung: Secondary | ICD-10-CM | POA: Diagnosis not present

## 2018-06-07 DIAGNOSIS — Z825 Family history of asthma and other chronic lower respiratory diseases: Secondary | ICD-10-CM

## 2018-06-07 DIAGNOSIS — I214 Non-ST elevation (NSTEMI) myocardial infarction: Secondary | ICD-10-CM | POA: Diagnosis not present

## 2018-06-07 DIAGNOSIS — Z9221 Personal history of antineoplastic chemotherapy: Secondary | ICD-10-CM

## 2018-06-07 DIAGNOSIS — D500 Iron deficiency anemia secondary to blood loss (chronic): Secondary | ICD-10-CM | POA: Diagnosis not present

## 2018-06-07 DIAGNOSIS — R2681 Unsteadiness on feet: Secondary | ICD-10-CM | POA: Diagnosis not present

## 2018-06-07 DIAGNOSIS — J9621 Acute and chronic respiratory failure with hypoxia: Secondary | ICD-10-CM | POA: Diagnosis present

## 2018-06-07 DIAGNOSIS — I5023 Acute on chronic systolic (congestive) heart failure: Secondary | ICD-10-CM | POA: Diagnosis not present

## 2018-06-07 DIAGNOSIS — Z8349 Family history of other endocrine, nutritional and metabolic diseases: Secondary | ICD-10-CM | POA: Diagnosis not present

## 2018-06-07 DIAGNOSIS — Z803 Family history of malignant neoplasm of breast: Secondary | ICD-10-CM | POA: Diagnosis not present

## 2018-06-07 DIAGNOSIS — Z8261 Family history of arthritis: Secondary | ICD-10-CM

## 2018-06-07 DIAGNOSIS — Z9981 Dependence on supplemental oxygen: Secondary | ICD-10-CM | POA: Diagnosis not present

## 2018-06-07 DIAGNOSIS — G9341 Metabolic encephalopathy: Secondary | ICD-10-CM | POA: Diagnosis present

## 2018-06-07 DIAGNOSIS — I110 Hypertensive heart disease with heart failure: Secondary | ICD-10-CM | POA: Diagnosis present

## 2018-06-07 DIAGNOSIS — R55 Syncope and collapse: Secondary | ICD-10-CM | POA: Diagnosis not present

## 2018-06-07 DIAGNOSIS — F17210 Nicotine dependence, cigarettes, uncomplicated: Secondary | ICD-10-CM | POA: Diagnosis present

## 2018-06-07 DIAGNOSIS — R402362 Coma scale, best motor response, obeys commands, at arrival to emergency department: Secondary | ICD-10-CM | POA: Diagnosis present

## 2018-06-07 DIAGNOSIS — I340 Nonrheumatic mitral (valve) insufficiency: Secondary | ICD-10-CM

## 2018-06-07 DIAGNOSIS — M6281 Muscle weakness (generalized): Secondary | ICD-10-CM | POA: Diagnosis not present

## 2018-06-07 DIAGNOSIS — I361 Nonrheumatic tricuspid (valve) insufficiency: Secondary | ICD-10-CM

## 2018-06-07 DIAGNOSIS — E782 Mixed hyperlipidemia: Secondary | ICD-10-CM | POA: Diagnosis present

## 2018-06-07 DIAGNOSIS — I5022 Chronic systolic (congestive) heart failure: Secondary | ICD-10-CM | POA: Diagnosis present

## 2018-06-07 DIAGNOSIS — F32A Depression, unspecified: Secondary | ICD-10-CM | POA: Diagnosis present

## 2018-06-07 DIAGNOSIS — Z811 Family history of alcohol abuse and dependence: Secondary | ICD-10-CM

## 2018-06-07 DIAGNOSIS — Z20828 Contact with and (suspected) exposure to other viral communicable diseases: Secondary | ICD-10-CM | POA: Diagnosis present

## 2018-06-07 DIAGNOSIS — J449 Chronic obstructive pulmonary disease, unspecified: Secondary | ICD-10-CM | POA: Diagnosis present

## 2018-06-07 DIAGNOSIS — I5043 Acute on chronic combined systolic (congestive) and diastolic (congestive) heart failure: Secondary | ICD-10-CM | POA: Diagnosis not present

## 2018-06-07 DIAGNOSIS — R402142 Coma scale, eyes open, spontaneous, at arrival to emergency department: Secondary | ICD-10-CM | POA: Diagnosis present

## 2018-06-07 DIAGNOSIS — I454 Nonspecific intraventricular block: Secondary | ICD-10-CM | POA: Diagnosis present

## 2018-06-07 DIAGNOSIS — Z9181 History of falling: Secondary | ICD-10-CM | POA: Diagnosis not present

## 2018-06-07 DIAGNOSIS — Z7401 Bed confinement status: Secondary | ICD-10-CM | POA: Diagnosis not present

## 2018-06-07 DIAGNOSIS — W19XXXA Unspecified fall, initial encounter: Secondary | ICD-10-CM | POA: Diagnosis not present

## 2018-06-07 DIAGNOSIS — E440 Moderate protein-calorie malnutrition: Secondary | ICD-10-CM | POA: Diagnosis present

## 2018-06-07 DIAGNOSIS — I10 Essential (primary) hypertension: Secondary | ICD-10-CM | POA: Diagnosis not present

## 2018-06-07 DIAGNOSIS — M1990 Unspecified osteoarthritis, unspecified site: Secondary | ICD-10-CM | POA: Diagnosis present

## 2018-06-07 DIAGNOSIS — M2550 Pain in unspecified joint: Secondary | ICD-10-CM | POA: Diagnosis not present

## 2018-06-07 DIAGNOSIS — K3184 Gastroparesis: Secondary | ICD-10-CM | POA: Diagnosis present

## 2018-06-07 DIAGNOSIS — R278 Other lack of coordination: Secondary | ICD-10-CM | POA: Diagnosis not present

## 2018-06-07 DIAGNOSIS — C799 Secondary malignant neoplasm of unspecified site: Secondary | ICD-10-CM | POA: Diagnosis not present

## 2018-06-07 DIAGNOSIS — F329 Major depressive disorder, single episode, unspecified: Secondary | ICD-10-CM | POA: Diagnosis present

## 2018-06-07 DIAGNOSIS — I4891 Unspecified atrial fibrillation: Secondary | ICD-10-CM | POA: Diagnosis not present

## 2018-06-07 DIAGNOSIS — K4030 Unilateral inguinal hernia, with obstruction, without gangrene, not specified as recurrent: Secondary | ICD-10-CM

## 2018-06-07 DIAGNOSIS — Z6820 Body mass index (BMI) 20.0-20.9, adult: Secondary | ICD-10-CM

## 2018-06-07 DIAGNOSIS — D509 Iron deficiency anemia, unspecified: Secondary | ICD-10-CM | POA: Diagnosis not present

## 2018-06-07 DIAGNOSIS — Z95828 Presence of other vascular implants and grafts: Secondary | ICD-10-CM

## 2018-06-07 DIAGNOSIS — I2510 Atherosclerotic heart disease of native coronary artery without angina pectoris: Secondary | ICD-10-CM | POA: Diagnosis present

## 2018-06-07 DIAGNOSIS — S0990XA Unspecified injury of head, initial encounter: Secondary | ICD-10-CM | POA: Diagnosis not present

## 2018-06-07 DIAGNOSIS — Z7982 Long term (current) use of aspirin: Secondary | ICD-10-CM

## 2018-06-07 DIAGNOSIS — Z03818 Encounter for observation for suspected exposure to other biological agents ruled out: Secondary | ICD-10-CM | POA: Diagnosis not present

## 2018-06-07 DIAGNOSIS — J9610 Chronic respiratory failure, unspecified whether with hypoxia or hypercapnia: Secondary | ICD-10-CM | POA: Diagnosis not present

## 2018-06-07 DIAGNOSIS — R457 State of emotional shock and stress, unspecified: Secondary | ICD-10-CM | POA: Diagnosis not present

## 2018-06-07 DIAGNOSIS — R402252 Coma scale, best verbal response, oriented, at arrival to emergency department: Secondary | ICD-10-CM | POA: Diagnosis present

## 2018-06-07 DIAGNOSIS — C3490 Malignant neoplasm of unspecified part of unspecified bronchus or lung: Secondary | ICD-10-CM | POA: Diagnosis not present

## 2018-06-07 DIAGNOSIS — Z452 Encounter for adjustment and management of vascular access device: Secondary | ICD-10-CM | POA: Diagnosis not present

## 2018-06-07 DIAGNOSIS — S199XXA Unspecified injury of neck, initial encounter: Secondary | ICD-10-CM | POA: Diagnosis not present

## 2018-06-07 DIAGNOSIS — R531 Weakness: Secondary | ICD-10-CM | POA: Diagnosis not present

## 2018-06-07 LAB — URINALYSIS, ROUTINE W REFLEX MICROSCOPIC
Bilirubin Urine: NEGATIVE
Glucose, UA: NEGATIVE mg/dL
Ketones, ur: NEGATIVE mg/dL
Leukocytes,Ua: NEGATIVE
Nitrite: NEGATIVE
Protein, ur: NEGATIVE mg/dL
Specific Gravity, Urine: 1.008 (ref 1.005–1.030)
pH: 6 (ref 5.0–8.0)

## 2018-06-07 LAB — CBC WITH DIFFERENTIAL/PLATELET
Abs Immature Granulocytes: 0.07 10*3/uL (ref 0.00–0.07)
Basophils Absolute: 0 10*3/uL (ref 0.0–0.1)
Basophils Relative: 0 %
Eosinophils Absolute: 0 10*3/uL (ref 0.0–0.5)
Eosinophils Relative: 0 %
HCT: 35.3 % — ABNORMAL LOW (ref 39.0–52.0)
Hemoglobin: 11.5 g/dL — ABNORMAL LOW (ref 13.0–17.0)
Immature Granulocytes: 1 %
Lymphocytes Relative: 2 %
Lymphs Abs: 0.2 10*3/uL — ABNORMAL LOW (ref 0.7–4.0)
MCH: 31.9 pg (ref 26.0–34.0)
MCHC: 32.6 g/dL (ref 30.0–36.0)
MCV: 98.1 fL (ref 80.0–100.0)
Monocytes Absolute: 0.8 10*3/uL (ref 0.1–1.0)
Monocytes Relative: 7 %
Neutro Abs: 9.9 10*3/uL — ABNORMAL HIGH (ref 1.7–7.7)
Neutrophils Relative %: 90 %
Platelets: 144 10*3/uL — ABNORMAL LOW (ref 150–400)
RBC: 3.6 MIL/uL — ABNORMAL LOW (ref 4.22–5.81)
RDW: 13.6 % (ref 11.5–15.5)
WBC: 10.9 10*3/uL — ABNORMAL HIGH (ref 4.0–10.5)
nRBC: 0 % (ref 0.0–0.2)

## 2018-06-07 LAB — COMPREHENSIVE METABOLIC PANEL
ALT: 24 U/L (ref 0–44)
AST: 53 U/L — ABNORMAL HIGH (ref 15–41)
Albumin: 3.5 g/dL (ref 3.5–5.0)
Alkaline Phosphatase: 63 U/L (ref 38–126)
Anion gap: 9 (ref 5–15)
BUN: 19 mg/dL (ref 8–23)
CO2: 30 mmol/L (ref 22–32)
Calcium: 8.4 mg/dL — ABNORMAL LOW (ref 8.9–10.3)
Chloride: 92 mmol/L — ABNORMAL LOW (ref 98–111)
Creatinine, Ser: 0.69 mg/dL (ref 0.61–1.24)
GFR calc Af Amer: >60 mL/min (ref >60)
GFR calc non Af Amer: >60 mL/min (ref >60)
Glucose, Bld: 109 mg/dL — ABNORMAL HIGH (ref 70–99)
Potassium: 3.9 mmol/L (ref 3.5–5.1)
Sodium: 131 mmol/L — ABNORMAL LOW (ref 135–145)
Total Bilirubin: 1.5 mg/dL — ABNORMAL HIGH (ref 0.3–1.2)
Total Protein: 5.8 g/dL — ABNORMAL LOW (ref 6.5–8.1)

## 2018-06-07 LAB — HEMOGLOBIN A1C
Hgb A1c MFr Bld: 5.5 % (ref 4.8–5.6)
Mean Plasma Glucose: 111.15 mg/dL

## 2018-06-07 LAB — PROTIME-INR
INR: 1.3 — ABNORMAL HIGH (ref 0.8–1.2)
Prothrombin Time: 16 seconds — ABNORMAL HIGH (ref 11.4–15.2)

## 2018-06-07 LAB — MAGNESIUM: Magnesium: 1.7 mg/dL (ref 1.7–2.4)

## 2018-06-07 LAB — TROPONIN I
Troponin I: 0.93 ng/mL (ref <0.03)
Troponin I: 3.26 ng/mL (ref <0.03)
Troponin I: 2.43 ng/mL (ref <0.03)
Troponin I: 3.08 ng/mL (ref <0.03)

## 2018-06-07 LAB — LIPID PANEL
Cholesterol: 124 mg/dL (ref 0–200)
HDL: 47 mg/dL (ref >40)
LDL Cholesterol: 71 mg/dL (ref 0–99)
Total CHOL/HDL Ratio: 2.6 RATIO
Triglycerides: 32 mg/dL (ref <150)
VLDL: 6 mg/dL (ref 0–40)

## 2018-06-07 LAB — BRAIN NATRIURETIC PEPTIDE: B Natriuretic Peptide: 656.6 pg/mL — ABNORMAL HIGH (ref 0.0–100.0)

## 2018-06-07 LAB — ECHOCARDIOGRAM LIMITED
Height: 69 in
Weight: 2440.93 oz

## 2018-06-07 LAB — HEPARIN LEVEL (UNFRACTIONATED): Heparin Unfractionated: <0.1 IU/mL — ABNORMAL LOW (ref 0.30–0.70)

## 2018-06-07 LAB — TSH: TSH: 1.222 u[IU]/mL (ref 0.350–4.500)

## 2018-06-07 LAB — APTT: aPTT: 32 seconds (ref 24–36)

## 2018-06-07 LAB — SARS CORONAVIRUS 2 BY RT PCR (HOSPITAL ORDER, PERFORMED IN ~~LOC~~ HOSPITAL LAB): SARS Coronavirus 2: NEGATIVE

## 2018-06-07 MED ORDER — HEPARIN (PORCINE) 25000 UT/250ML-% IV SOLN
1850.0000 [IU]/h | INTRAVENOUS | Status: DC
  Administered 2018-06-07: 22:00:00 1050 [IU]/h via INTRAVENOUS
  Administered 2018-06-07: 05:00:00 850 [IU]/h via INTRAVENOUS
  Administered 2018-06-08: 1450 [IU]/h via INTRAVENOUS
  Administered 2018-06-09: 08:00:00 1650 [IU]/h via INTRAVENOUS
  Administered 2018-06-09: 22:00:00 1850 [IU]/h via INTRAVENOUS
  Filled 2018-06-07 (×7): qty 250

## 2018-06-07 MED ORDER — HEPARIN BOLUS VIA INFUSION
2000.0000 [IU] | Freq: Once | INTRAVENOUS | Status: AC
  Administered 2018-06-07: 17:00:00 2000 [IU] via INTRAVENOUS
  Filled 2018-06-07: qty 2000

## 2018-06-07 MED ORDER — ATORVASTATIN CALCIUM 40 MG PO TABS
40.0000 mg | ORAL_TABLET | Freq: Every day | ORAL | Status: DC
  Administered 2018-06-07 – 2018-06-15 (×9): 40 mg via ORAL
  Filled 2018-06-07 (×9): qty 1

## 2018-06-07 MED ORDER — BISACODYL 5 MG PO TBEC: 5.0000 mg | DELAYED_RELEASE_TABLET | Freq: Every day | ORAL | Status: DC | PRN

## 2018-06-07 MED ORDER — FUROSEMIDE 10 MG/ML IJ SOLN
40.0000 mg | Freq: Two times a day (BID) | INTRAMUSCULAR | Status: DC
  Administered 2018-06-07 – 2018-06-09 (×5): 40 mg via INTRAVENOUS
  Filled 2018-06-07 (×5): qty 4

## 2018-06-07 MED ORDER — HYDROCODONE-ACETAMINOPHEN 5-325 MG PO TABS
1.0000 | ORAL_TABLET | ORAL | Status: DC | PRN
  Administered 2018-06-08: 21:00:00 1 via ORAL
  Administered 2018-06-08: 05:00:00 2 via ORAL
  Administered 2018-06-08 – 2018-06-15 (×9): 1 via ORAL
  Filled 2018-06-07 (×2): qty 1
  Filled 2018-06-07: qty 2
  Filled 2018-06-07 (×8): qty 1

## 2018-06-07 MED ORDER — SENNOSIDES-DOCUSATE SODIUM 8.6-50 MG PO TABS: 1.0000 | ORAL_TABLET | Freq: Every evening | ORAL | Status: DC | PRN

## 2018-06-07 MED ORDER — IPRATROPIUM-ALBUTEROL 0.5-2.5 (3) MG/3ML IN SOLN: 3.0000 mL | Freq: Four times a day (QID) | RESPIRATORY_TRACT | Status: DC | PRN

## 2018-06-07 MED ORDER — ACETAMINOPHEN 650 MG RE SUPP: 650.0000 mg | Freq: Four times a day (QID) | RECTAL | Status: DC | PRN

## 2018-06-07 MED ORDER — ASPIRIN EC 81 MG PO TBEC
81.0000 mg | DELAYED_RELEASE_TABLET | Freq: Every day | ORAL | Status: DC
  Administered 2018-06-08 – 2018-06-15 (×8): 81 mg via ORAL
  Filled 2018-06-07 (×8): qty 1

## 2018-06-07 MED ORDER — ASPIRIN 325 MG PO TABS
325.0000 mg | ORAL_TABLET | Freq: Once | ORAL | Status: AC
  Administered 2018-06-07: 14:00:00 325 mg via ORAL
  Filled 2018-06-07: qty 1

## 2018-06-07 MED ORDER — IPRATROPIUM-ALBUTEROL 20-100 MCG/ACT IN AERS: 1.0000 | INHALATION_SPRAY | Freq: Four times a day (QID) | RESPIRATORY_TRACT | Status: DC | PRN

## 2018-06-07 MED ORDER — SUCRALFATE 1 GM/10ML PO SUSP
1.0000 g | Freq: Two times a day (BID) | ORAL | Status: DC
  Administered 2018-06-07 – 2018-06-15 (×16): 1 g via ORAL
  Filled 2018-06-07 (×16): qty 10

## 2018-06-07 MED ORDER — PANTOPRAZOLE SODIUM 40 MG PO TBEC
40.0000 mg | DELAYED_RELEASE_TABLET | Freq: Every day | ORAL | Status: DC
  Administered 2018-06-07 – 2018-06-15 (×8): 40 mg via ORAL
  Filled 2018-06-07 (×9): qty 1

## 2018-06-07 MED ORDER — ACETAMINOPHEN 325 MG PO TABS: 650.0000 mg | ORAL_TABLET | Freq: Four times a day (QID) | ORAL | Status: DC | PRN

## 2018-06-07 MED ORDER — FUROSEMIDE 10 MG/ML IJ SOLN: 80.0000 mg | Freq: Once | INTRAMUSCULAR | Status: DC

## 2018-06-07 MED ORDER — FUROSEMIDE 10 MG/ML IJ SOLN
20.0000 mg | Freq: Once | INTRAMUSCULAR | Status: AC
  Administered 2018-06-07: 06:00:00 20 mg via INTRAVENOUS
  Filled 2018-06-07: qty 4

## 2018-06-07 MED ORDER — HEPARIN BOLUS VIA INFUSION
4000.0000 [IU] | Freq: Once | INTRAVENOUS | Status: AC
  Administered 2018-06-07: 05:00:00 4000 [IU] via INTRAVENOUS
  Filled 2018-06-07: qty 4000

## 2018-06-07 NOTE — ED Notes (Signed)
Pt said he is unable to provide urine specimen.
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
## 2018-06-07 NOTE — H&P (Signed)
History and Physical    Bradley Meadows ZOX:[id number] DOB: 1941-01-19 DOA: 06/07/2018  PCP: Susy Frizzle, MD Patient coming from: [facility]  Chief Complaint: Syncope and hypoxia  HPI: Bradley Meadows is a 78 y.o. male with medical history significant of CAD, right lung  NCSL stage III, systolic CHF 81%, iron deficiency anemia, GERD, hypertension, active tobacco use, COPD on 4 L nasal cannula, hyperlipidemia was brought to the hospital for evaluation of syncope and hypoxia.  Patient poorly remembers the events therefore history per daughter. I spoke with the patient's daughter over the phone who tells me that patient lives at home alone and her brother frequently checks up on him.  After his brother left yesterday about an hour later patient was not answering his phone therefore they requested the neighbor to check up on him.  When neighbor arrived, through the window he saw patient was laying on the floor in the kitchen therefore EMS was called.  At that time patient was noted to be cyanotic with oxygen saturation in 60%.  He was off his oxygen.  He was given supplemental oxygen which improved his oxygen levels and his mentation slowly.  He was brought to the ER for further evaluation. Apparently patient was off his oxygen and smoking a cigarette. Upon further questioning, daughter states patient has become more progressive short of breath over last several weeks with increasing lower extremity edema.  They have also noted significant amount of weight loss.  He supposed to be getting PET scan soon to reevaluate for recurrence of his malignancy.  Patient also lives alone at home and uses walker to get around but overall has been unsteady.  In the ER patient was initially hypoxic requiring 4 L nasal cannula.  His chest x-ray suggested vascular congestion.  Troponin elevated at 0.93 and BNP more than 600.  EKG showed premature ventricular rhythm with bundle branch block.  Case was discussed with cardiology who  recommended starting patient on heparin drip and transferred to [place]: As per HPI otherwise 10 point review of systems negative.  Review of Systems Otherwise negative except as per HPI, including: Difficult to obtain full review of system from the patient as he does not remember much of the events but at this time he is denying all other complaints including chest pain.  General = no fevers, chills, dizziness, malaise, fatigue HEENT/EYES = negative for pain, redness, loss of vision, double vision, blurred vision, loss of hearing, sore throat, hoarseness, dysphagia Cardiovascular= negative for chest pain, palpitation, murmurs, lower extremity swelling Respiratory/lungs= negative for shortness of breath, cough, hemoptysis, wheezing, mucus production Gastrointestinal= negative for nausea, vomiting,, abdominal pain, melena, hematemesis Genitourinary= negative for Dysuria, Hematuria, Change in Urinary Frequency MSK = Negative for arthralgia, myalgias, Back Pain, Joint swelling  Neurology= Negative for headache, seizures, numbness, tingling  Psychiatry= Negative for anxiety, depression, suicidal and homocidal ideation Allergy/Immunology= Medication/Food allergy as listed  Skin= Negative for Rash, lesions, ulcers, itching  Past Medical History:  Diagnosis Date   Arthritis    Asthma    CHF (congestive heart failure) (HCC)    ef=45-50%   COPD (chronic obstructive pulmonary disease) (HCC)    home O2   Coronary artery disease    40-50% mLAD, 40% pRCA, 30% mRCA, occluded CX with retrograde filling 06/01/06 cath   GERD (gastroesophageal reflux disease)    ulcers   Goals of care, counseling/discussion 04/07/2016   History of external beam radiation  therapy 04/24/16-06/04/16   chest 60 Gy in 30 fractions   Hyperlipidemia    Hypertension    Lung cancer, primary, 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, right ([place]) 04/07/2016   PVC's (premature  ventricular contractions)    2017   Squamous cell lung cancer, right ([place]) 04/07/2016    Past Surgical History:  Procedure Laterality Date   ESOPHAGOGASTRODUODENOSCOPY (EGD) WITH PROPOFOL N/A 02/26/2016   Procedure: ESOPHAGOGASTRODUODENOSCOPY (EGD) WITH PROPOFOL;  Surgeon: Irene Shipper, MD;  Location: [facility];  Service: Endoscopy;  Laterality: N/A;   EXPLORATORY LAPAROTOMY     for ulcer  "washed me out with a hose pipe"   EYE SURGERY     cataracts   HERNIA REPAIR     INGUINAL HERNIA REPAIR Left 09/26/2016   Procedure: HERNIA REPAIR INGUINAL LEFT;  Surgeon: Jackolyn Confer, MD;  Location: [facility];  Service: General;  Laterality: Left;  With MESH   INGUINAL HERNIA REPAIR Right 02/26/2017   Procedure: HERNIA REPAIR INGUINAL INCARCERATED WITH MESH;  Surgeon: Excell Seltzer, MD;  Location: [facility];  Service: General;  Laterality: Right;   INSERTION OF MESH Right 02/26/2017   Procedure: INSERTION OF MESH;  Surgeon: Excell Seltzer, MD;  Location: [facility];  Service: General;  Laterality: Right;   IR GENERIC HISTORICAL  04/10/2016   IR US GUIDE VASC ACCESS RIGHT 04/10/2016 Markus Daft, MD [facility]   IR GENERIC HISTORICAL  04/10/2016   IR FLUORO GUIDE PORT INSERTION RIGHT 04/10/2016 Markus Daft, MD [facility]   IR GENERIC HISTORICAL  04/10/2016   IR FLUORO RM 30-60 MIN 04/10/2016 Markus Daft, MD [facility]   IR GENERIC HISTORICAL  04/18/2016   IR FLUORO RM 30-60 MIN 04/18/2016 Greggory Keen, MD [facility]   JOINT REPLACEMENT     knee both   PORTACATH PLACEMENT Right 04/10/2016   VIDEO BRONCHOSCOPY WITH ENDOBRONCHIAL ULTRASOUND N/A 03/26/2016   Procedure: VIDEO BRONCHOSCOPY WITH ENDOBRONCHIAL ULTRASOUND;  Surgeon: Collene Gobble, MD;  Location: [facility];  Service: Thoracic;  Laterality: N/A;    SOCIAL HISTORY:  reports that he quit smoking about 2 years ago. His smoking use included cigarettes. He has a 45.00 pack-year smoking history. He has never used smokeless tobacco. He  reports that he does not drink alcohol or use drugs.  Allergies  Allergen Reactions   No Known Allergies     FAMILY HISTORY: Family History  Problem Relation Age of Onset   Arthritis Mother    COPD Mother    Alcohol abuse Father    Breast cancer Sister    Heart disease Paternal Grandfather    Hyperlipidemia Sister      Prior to Admission medications   Medication Sig Start Date End Date Taking? Authorizing Provider  aspirin EC 81 MG tablet Take 81 mg by mouth daily.   Yes [provider]  atorvastatin (LIPITOR) 40 MG tablet TAKE 1 TABLET(40 MG) BY MOUTH DAILY Patient taking differently: Take 40 mg by mouth daily.  05/08/17  Yes Pickard, Cammie Mcgee, MD  CARAFATE 1 GM/10ML suspension TAKE 10 MLS BY MOUTH TWICE DAILY Patient taking differently: Take 1 g by mouth 2 (two) times daily.  01/28/18  Yes Susy Frizzle, MD  HYDROcodone-acetaminophen (NORCO/VICODIN) 5-325 MG tablet Take 1 tablet by mouth every 6 (six) hours as needed for severe pain. 05/20/18  Yes Susy Frizzle, MD  Ipratropium-Albuterol (COMBIVENT RESPIMAT) 20-100 MCG/ACT AERS respimat INHALE 1 PUFF BY MOUTH EVERY 6 HOURS AS NEEDED FOR WHEEZING Patient taking  differently: Inhale 1 puff into the lungs every 6 (six) hours as needed for wheezing or shortness of breath.  12/02/17  Yes Susy Frizzle, MD  ipratropium-albuterol (DUONEB) 0.5-2.5 (3) MG/3ML SOLN USE 1 VIAL VIA NEBULIZER EVERY 6 HOURS AS NEEDED FOR SHORTNESS OF BREATH OR WHEEZING Patient taking differently: Inhale 3 mLs into the lungs every 6 (six) hours as needed (SOB, wheezing).  12/03/17  Yes Susy Frizzle, MD  meclizine (ANTIVERT) 12.5 MG tablet TAKE 1 TABLET(12.5 MG) BY MOUTH THREE TIMES DAILY AS NEEDED FOR DIZZINESS Patient taking differently: Take 12.5 mg by mouth 3 (three) times daily as needed for dizziness.  03/05/18  Yes Volanda Napoleon, MD  Misc. Devices (TRANSFER BENCH) MISC Shower transfer bench 12/23/17  Yes Volanda Napoleon, MD    Multiple Vitamin (MULTIVITAMIN) tablet Take 1 tablet by mouth daily. Centrum Silver   Yes [provider]  omeprazole (PRILOSEC) 20 MG capsule Take 1 capsule (20 mg total) by mouth daily. 03/31/17  Yes Susy Frizzle, MD  STIOLTO RESPIMAT 2.5-2.5 MCG/ACT AERS INHALE 2 PUFFS INTO THE LUNGS DAILY Patient taking differently: Inhale 2 puffs into the lungs daily.  03/08/18  Yes Susy Frizzle, MD  Pyridoxine HCl (VITAMIN B-6) 250 MG tablet Take 1 tablet (250 mg total) by mouth daily. Patient not taking: Reported on 06/07/2018 07/10/17   Volanda Napoleon, MD    Physical Exam: Vitals:   06/07/18 0530 06/07/18 0600 06/07/18 0630 06/07/18 0700  BP: (!) 100/56 (!) 112/59 122/72 115/72  Pulse: (!) 104 (!) 47 (!) 40 78  Resp: (!) 22 (!) 21 (!) 27 (!) 21  Temp:      TempSrc:      SpO2: (!) 89% 96% 99% 96%  Weight:      Height:          Constitutional: NAD, calm, comfortable, 4 L nasal cannula Eyes: PERRL, lids and conjunctivae normal ENMT: Mucous membranes are moist. Posterior pharynx clear of any exudate or lesions.Normal dentition.  Neck: normal, supple, no masses, no thyromegaly Respiratory: Bilateral crackles Cardiovascular: Regular rate and rhythm, no murmurs / rubs / gallops.  1-2+ pitting edema. 2+ pedal pulses. No carotid bruits.  Abdomen: no tenderness, no masses palpated. No hepatosplenomegaly. Bowel sounds positive.  Musculoskeletal: no clubbing / cyanosis. No joint deformity upper and lower extremities. Good ROM, no contractures. Normal muscle tone.  Skin: no rashes, lesions, ulcers. No induration Neurologic: CN 2-12 grossly intact. Sensation intact, DTR normal. Strength 4/5 in all 4.  Psychiatric: Poorjudgment and insight. Alert and oriented x 2. Normal mood.     Labs on Admission: I have personally reviewed following labs and imaging studies  CBC: Recent Labs  Lab 06/07/18 0125  WBC 10.9*  NEUTROABS 9.9*  HGB 11.5*  HCT 35.3*  MCV 98.1  PLT 144*   Basic  Metabolic Panel: Recent Labs  Lab 06/07/18 0125  NA 131*  K 3.9  CL 92*  CO2 30  GLUCOSE 109*  BUN 19  CREATININE 0.69  CALCIUM 8.4*  MG 1.7   GFR: Estimated Creatinine Clearance: 74.7 mL/min (by C-G formula based on SCr of 0.69 mg/dL). Liver Function Tests: Recent Labs  Lab 06/07/18 0125  AST 53*  ALT 24  ALKPHOS 63  BILITOT 1.5*  PROT 5.8*  ALBUMIN 3.5   No results for input(s): LIPASE, AMYLASE in the last 168 hours. No results for input(s): AMMONIA in the last 168 hours. Coagulation Profile: Recent Labs  Lab 06/07/18 0408  INR 1.3*   Cardiac Enzymes: Recent Labs  Lab 06/07/18 0125  TROPONINI 0.93*   BNP (last 3 results) No results for input(s): PROBNP in the last 8760 hours. HbA1C: No results for input(s): HGBA1C in the last 72 hours. CBG: No results for input(s): GLUCAP in the last 168 hours. Lipid Profile: No results for input(s): CHOL, HDL, LDLCALC, TRIG, CHOLHDL, LDLDIRECT in the last 72 hours. Thyroid Function Tests: No results for input(s): TSH, T4TOTAL, FREET4, T3FREE, THYROIDAB in the last 72 hours. Anemia Panel: No results for input(s): VITAMINB12, FOLATE, FERRITIN, TIBC, IRON, RETICCTPCT in the last 72 hours. Urine analysis:    Component Value Date/Time   COLORURINE YELLOW 09/21/2008 1311   APPEARANCEUR CLEAR 09/21/2008 1311   LABSPEC 1.013 09/21/2008 1311   PHURINE 7.0 09/21/2008 1311   GLUCOSEU NEGATIVE 09/21/2008 1311   HGBUR NEGATIVE 09/21/2008 1311   BILIRUBINUR NEG 07/05/2013 1530   KETONESUR NEGATIVE 09/21/2008 1311   PROTEINUR 100 07/05/2013 1530   PROTEINUR NEGATIVE 09/21/2008 1311   UROBILINOGEN 0.2 07/05/2013 1530   UROBILINOGEN 1.0 09/21/2008 1311   NITRITE NEG 07/05/2013 1530   NITRITE NEGATIVE 09/21/2008 1311   LEUKOCYTESUR Negative 07/05/2013 1530   Sepsis Labs: !!!!!!!!!!!!!!!!!!!!!!!!!!!!!!!!!!!!!!!!!!!! @LABRCNTIP (procalcitonin:4,lacticidven:4) ) Recent Results (from the past 240 hour(s))  SARS Coronavirus 2  (CEPHEID - Performed in [facility] hospital lab), Hosp Order     Status: None   Collection Time: 06/07/18  3:21 AM  Result Value Ref Range Status   SARS Coronavirus 2 NEGATIVE NEGATIVE Final    Comment: (NOTE) If result is NEGATIVE SARS-CoV-2 target nucleic acids are NOT DETECTED. The SARS-CoV-2 RNA is generally detectable in upper and lower  respiratory specimens during the acute phase of infection. The lowest  concentration of SARS-CoV-2 viral copies this assay can detect is 250  copies / mL. A negative result does not preclude SARS-CoV-2 infection  and should not be used as the sole basis for treatment or other  patient management decisions.  A negative result may occur with  improper specimen collection / handling, submission of specimen other  than nasopharyngeal swab, presence of viral mutation(s) within the  areas targeted by this assay, and inadequate number of viral copies  (<250 copies / mL). A negative result must be combined with clinical  observations, patient history, and epidemiological information. If result is POSITIVE SARS-CoV-2 target nucleic acids are DETECTED. The SARS-CoV-2 RNA is generally detectable in upper and lower  respiratory specimens dur ing the acute phase of infection.  Positive  results are indicative of active infection with SARS-CoV-2.  Clinical  correlation with patient history and other diagnostic information is  necessary to determine patient infection status.  Positive results do  not rule out bacterial infection or co-infection with other viruses. If result is PRESUMPTIVE POSTIVE SARS-CoV-2 nucleic acids MAY BE PRESENT.   A presumptive positive result was obtained on the submitted specimen  and confirmed on repeat testing.  While 2019 novel coronavirus  (SARS-CoV-2) nucleic acids may be present in the submitted sample  additional confirmatory testing may be necessary for epidemiological  and / or clinical management purposes  to differentiate  between  SARS-CoV-2 and other Sarbecovirus currently known to infect humans.  If clinically indicated additional testing with an alternate test  methodology [phone]) is advised. The SARS-CoV-2 RNA is generally  detectable in upper and lower respiratory sp ecimens during the acute  phase of infection. The expected result is Negative. Fact Sheet for Patients:  StrictlyIdeas.no Fact Sheet for Healthcare Providers:  BankingDealers.co.za This test is not yet approved or cleared by the [place] and has been authorized for detection and/or diagnosis of SARS-CoV-2 by FDA under an Emergency Use Authorization (EUA).  This EUA will remain in effect (meaning this test can be used) for the duration of the COVID-19 declaration under Section 564(b)(1) of the Act, 21 U.S.C. section 360bbb-3(b)(1), unless the authorization is terminated or revoked sooner. Performed at [facility], [place] [address], [place], [place] [address]      Radiological Exams on Admission: Ct Head Wo Contrast  Result Date: 06/07/2018 CLINICAL DATA:  78 y/o M; fall with head and neck trauma. History of lung cancer. EXAM: CT HEAD WITHOUT CONTRAST CT CERVICAL SPINE WITHOUT CONTRAST TECHNIQUE: Multidetector CT imaging of the head and cervical spine was performed following the standard protocol without intravenous contrast. Multiplanar CT image reconstructions of the cervical spine were also generated. COMPARISON:  12/11/2017 PET-CT. FINDINGS: CT HEAD FINDINGS Brain: No evidence of acute infarction, hemorrhage, hydrocephalus, extra-axial collection or mass lesion/mass effect. Few nonspecific white matter hypodensities are compatible with mild chronic microvascular ischemic changes and there is mild volume loss of the brain. Vascular: Calcific atherosclerosis of carotid siphons. Skull: Normal. Negative for fracture or focal lesion. Sinuses/Orbits: No acute finding.  Other: Bilateral intra-ocular lens replacement. CT CERVICAL SPINE FINDINGS Alignment: Mild reversal of cervical curvature with apex at C6. C4-5 and C5-6 grade 1 anterolisthesis. Skull base and vertebrae: No acute fracture. No primary bone lesion or focal pathologic process. Soft tissues and spinal canal: No prevertebral fluid or swelling. No visible canal hematoma. Disc levels: Productive changes of the articulation of anterior arch of C1, odontoid, and basion. Right-greater-than-left facet hypertrophy and multilevel discogenic degenerative changes greatest at C5-C7. Uncovertebral and facet hypertrophy contribute to bony neural foraminal stenosis at the bilateral C2-3, right C3-4, right greater than left C4-5, right greater than left C5-6, bilateral C6-7 levels. No high-grade bony spinal canal stenosis. Upper chest: Centrilobular emphysema. Other: Calcific atherosclerosis of carotid bifurcations. IMPRESSION: 1. No acute intracranial abnormality or calvarial fracture. 2. No acute fracture or dislocation of the cervical spine. 3. Mild chronic microvascular ischemic changes and volume loss of the brain. 4. 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 5.  Emphysema (ICD10-J43.9). Electronically Signed   By: Kristine Garbe M.D.   On: 06/07/2018 01:17   Ct Cervical Spine Wo Contrast  Result Date: 06/07/2018 CLINICAL DATA:  78 y/o M; fall with head and neck trauma. History of lung cancer. EXAM: CT HEAD WITHOUT CONTRAST CT CERVICAL SPINE WITHOUT CONTRAST TECHNIQUE: Multidetector CT imaging of the head and cervical spine was performed following the standard protocol without intravenous contrast. Multiplanar CT image reconstructions of the cervical spine were also generated. COMPARISON:  12/11/2017 PET-CT. FINDINGS: CT HEAD FINDINGS Brain: No evidence of acute infarction, hemorrhage, hydrocephalus, extra-axial collection or mass lesion/mass effect. Few nonspecific white matter hypodensities are  compatible with mild chronic microvascular ischemic changes and there is mild volume loss of the brain. Vascular: Calcific atherosclerosis of carotid siphons. Skull: Normal. Negative for fracture or focal lesion. Sinuses/Orbits: No acute finding. Other: Bilateral intra-ocular lens replacement. CT CERVICAL SPINE FINDINGS Alignment: Mild reversal of cervical curvature with apex at C6. C4-5 and C5-6 grade 1 anterolisthesis. Skull base and vertebrae: No acute fracture. No primary bone lesion or focal pathologic process. Soft tissues and spinal canal: No prevertebral fluid or swelling. No visible canal hematoma. Disc levels: Productive changes of the articulation of anterior arch of C1, odontoid, and basion.  Right-greater-than-left facet hypertrophy and multilevel discogenic degenerative changes greatest at C5-C7. Uncovertebral and facet hypertrophy contribute to bony neural foraminal stenosis at the bilateral C2-3, right C3-4, right greater than left C4-5, right greater than left C5-6, bilateral C6-7 levels. No high-grade bony spinal canal stenosis. Upper chest: Centrilobular emphysema. Other: Calcific atherosclerosis of carotid bifurcations. IMPRESSION: 1. No acute intracranial abnormality or calvarial fracture. 2. No acute fracture or dislocation of the cervical spine. 3. Mild chronic microvascular ischemic changes and volume loss of the brain. 4. 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 5.  Emphysema (ICD10-J43.9). Electronically Signed   By: Kristine Garbe M.D.   On: 06/07/2018 01:17   Dg Chest Port 1 View  Result Date: 06/07/2018 CLINICAL DATA:  Weakness, syncope EXAM: PORTABLE CHEST 1 VIEW COMPARISON:  PET CT 12/11/2017 FINDINGS: Right Port-A-Cath in place with the tip in the right atrium. Cardiomegaly with vascular congestion. Interstitial prominence likely reflects interstitial edema. Bibasilar atelectasis or infiltrates. No effusions. No acute bony abnormality. IMPRESSION:  Cardiomegaly with vascular congestion and probable interstitial edema. Bibasilar atelectasis or infiltrates. Cannot exclude pneumonia particularly in the right lung base. Electronically Signed   By: Rolm Baptise M.D.   On: 06/07/2018 03:52     All images have been reviewed by me personally.  EKG: Independently reviewed.  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  No brain bleed noted. - Initial troponin elevated at 0.93, EKG does not show any ST-T elevation but concerns for ACS.  Started on heparin drip.  Cardiology has been consulted. - Check hemoglobin A1c and lipid panel - Trend cardiac enzymes. - Left heart catheterization April 2018 showed occluded left circumflex with collaterals and medical treatment was recommended.Aspirin 325 mg ordered - Continue statin  Acute congestive heart failure with reduced ejection fraction, 45%, class III Pulmonary edema and congestion - Lasix 20 mg IV given in the ER, patient urinating well.  Will follow him clinically and monitor his response before ordering further Lasix depending on his next set of troponins and further medical plan in case if he requires another left heart catheterization?.  At rest his oxygen levels are adequate on 3-4 L which is his baseline for  now.  Generalized weakness -PT/OT  History of COPD with chronic hypoxia on 4 L nasal cannula NSCL Stage III -Status post chemo.  Plans for upcoming repeat PET scan as outpatient to reevaluate for his malignancy.  Will defer this outpatient. -Bronchodilators PRN  GERD -PPI  I had an extensive discussion with the patient's daughter over the phone regarding his care.  I also discussed with her about his disposition given his weakness and limited help available at home.  At this time I have advised her to try and make decision in terms of his disposition, and case after physical therapy evaluation to  decide patient needs to go to rehab or even assisted living/nursing home. Due to COVID, they are not necessarily sure at this time.  She understands that she needs to convey this to [place] so we can appropriately order repeat COVID test as lots of facilities are requiring 2 negative tests. Once seen by PT/OT, will need CM/SW to discuss options with the family.    DVT prophylaxis: Hep Drip Code Status: Full Family Communication: Spoke extensively with the patient's daughter over the phone Disposition Plan:  TBD Consults called: Cardiology Admission status: Inpatient admission to [facility] for Further Cardiac care and possible LHC.    Time Spent: 65 minutes.  >50% of the time was devoted to discussing the patients care, assessment, plan and disposition with other care givers along with counseling the patient about the risks and benefits of treatment.    Casady Voshell Arsenio Loader MD [facility]  If 7PM-7AM, please contact night-coverage [email or web address]  06/07/2018, 7:54 AM

## 2018-06-07 NOTE — Progress Notes (Signed)
ANTICOAGULATION CONSULT NOTE - Follow up  Pharmacy Consult for IV heparin Indication: chest pain/ACS  Allergies  Allergen Reactions  . No Known Allergies     Patient Measurements: Height: 5\' 9"  (175.3 cm) Weight: 152 lb 8.9 oz (69.2 kg) IBW/kg (Calculated) : 70.7 Heparin Dosing Weight: actual 69.2 kg  Vital Signs: Temp: 98.2 F (36.8 C) (05/04 1514) Temp Source: Oral (05/04 1514) BP: 99/69 (05/04 1514) Pulse Rate: 77 (05/04 1514)  Labs: Recent Labs    06/07/18 0125 06/07/18 0408 06/07/18 0804 06/07/18 1241 06/07/18 1456  HGB 11.5*  --   --   --   --   HCT 35.3*  --   --   --   --   PLT 144*  --   --   --   --   APTT  --  32  --   --   --   LABPROT  --  16.0*  --   --   --   INR  --  1.3*  --   --   --   HEPARINUNFRC  --   --   --   --  <0.10*  CREATININE 0.69  --   --   --   --   TROPONINI 0.93*  --  3.26* 2.43*  --     Estimated Creatinine Clearance: 74.5 mL/min (by C-G formula based on SCr of 0.69 mg/dL).   Medical History: Past Medical History:  Diagnosis Date  . Arthritis   . Asthma   . CHF (congestive heart failure) (HCC)    ef=45-50%  . COPD (chronic obstructive pulmonary disease) ([place])    home O2  . Coronary artery disease    40-50% mLAD, 40% pRCA, 30% mRCA, occluded CX with retrograde filling 06/01/06 cath  . GERD (gastroesophageal reflux disease)    ulcers  . Goals of care, counseling/discussion 04/07/2016  . History of external beam radiation therapy 04/24/16-06/04/16   chest 60 Gy in 30 fractions  . Hyperlipidemia   . Hypertension   . 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 PVC's (premature ventricular contractions)    2017  . Squamous cell lung cancer, right ([place]) 04/07/2016    Medications:  Scheduled:  Infusions:   Assessment: 89 yoM found down in his kitchen by a neighbor. Troponin = 0.93 PTA asa, lipitor, no anticoagulants.  Started on IV heparin drip with bolus this morning for ACS.   1st heparin level is low , =<0.1, drawn ~ 10 hours after bolus/start of heparin.  RN reports since pt transferred to [facility], heparin infusing at correct rate via peripheral line without interuptions, IV site looks good and no bleeding noted.   Troponin 0.93>3.26>2.43   Baseline labs: H/H=11.5/35.3, Plts = 144 Aptt/INR =32 seconds/ 1.3  Goal of Therapy:  Heparin level 0.3-0.7 units/ml Monitor platelets by anticoagulation protocol: Yes   Plan:  Give Heparin bolus 2000 units x1 and increase heparin rate to 1050 units/hr Check 8 hour HL Daily CBC/HL  Thank you for allowing pharmacy to be part of this patients care team.  Nicole Cella, RPh Clinical Pharmacist Please check AMION for all [place] phone numbers After 10:00 PM, call [place] [phone] 06/07/2018,4:14 PM

## 2018-06-07 NOTE — ED Notes (Signed)
Spoke to [facility] for ETA. [facility] stated it will be over an hour before they arrive.

## 2018-06-07 NOTE — ED Notes (Signed)
Called pt daughter [name], gave her update on pt. She said she will be going to work soon and to please leave a message on her voicemail with his room number at [facility].

## 2018-06-07 NOTE — ED Notes (Signed)
Spoke with patient daughter.

## 2018-06-07 NOTE — ED Notes (Signed)
Date and time results received: 06/07/18 0900 (use smartphrase ".now" to insert current time)  Test: tROPONIN Critical Value: 3.26  Name of Provider Notified: Reesa Chew MD  Orders Received? Or Actions Taken?: awaiting orders, [facility] in route for transfer.

## 2018-06-07 NOTE — ED Notes (Signed)
Attempted to call  Nursing report, nurse unable to take at this time, will call back.

## 2018-06-07 NOTE — Progress Notes (Signed)
PT Cancellation Note  Patient Details Name: JAYVIAN ESCOE MRN: [id number] DOB: 1940/02/10   Cancelled Treatment:    Reason Eval/Treat Not Completed: Patient not medically ready - Pt with uptrending troponin 0.93 to 3.26. PT to continue to follow acutely.  Julien Girt, PT Acute Rehabilitation Services Pager [phone]  Office [place] 06/07/2018, 1:31 PM

## 2018-06-07 NOTE — ED Notes (Signed)
[facility] called for transport 

## 2018-06-07 NOTE — ED Notes (Signed)
Per MD Reesa Chew, do not give lasix and d/c bladder scan, pt is currently voiding.

## 2018-06-07 NOTE — ED Notes (Addendum)
On assessment patient was soiled in urine and dried fecal matter. Patient was not dressed out.   This RN and tech changed and cleaned patient. New linen placed on bed and gown placed on patient. Patient given warm blanket and coffee.   Urine output-400 mLs   Ankit, MD paged and made aware.

## 2018-06-07 NOTE — ED Notes (Signed)
Patient transported to CT 

## 2018-06-07 NOTE — Progress Notes (Addendum)
  Echocardiogram 2D Echocardiogram has been performed.  Technically difficult study due to poor patient positioning. Patient unable to lower head due to difficulty breathing.  Bradley Meadows L Androw 06/07/2018, 3:00 PM

## 2018-06-07 NOTE — ED Triage Notes (Signed)
Pt transported via [facility] from home. EMS reports the following:  Neighbor checked on him, found him on floor in kitchen, wasn't on oxygen, confused, O2 sats in 60%, hypoxia. They put him on 3 L, typical oxygen level at home, sat him up and his O2 came up 89-90%. Weak. Hx stage 4 cancer, last treatment sept.

## 2018-06-07 NOTE — ED Notes (Signed)
Date and time results received: 06/07/18 2:37 AM  (use smartphrase ".now" to insert current time)  Test: Troponin Critical [place]  Name of Provider Notified: Dr.Pollina  Orders Received? Or Actions Taken?:

## 2018-06-07 NOTE — ED Notes (Signed)
Date and time results received: 06/07/18 8:49 AM  (use smartphrase ".now" to insert current time)  Test: troponin Critical Value: 3.26  Name of Provider Notified: Rolla Plate RN and Caryl Pina RN  Orders Received? Or Actions Taken?:

## 2018-06-07 NOTE — ED Notes (Signed)
Bed: MA00 Expected date:  Expected time:  Means of arrival:  Comments: 78 yo M/weakness

## 2018-06-07 NOTE — Progress Notes (Signed)
Pt received from [facility] ED via [place]. VSS on 3L [place]. CHG complete. Telemetry applied. Pt oriented to room and unit. Lunch tray ordered. Will continue to monitor.  Clyde Canterbury, RN

## 2018-06-07 NOTE — ED Notes (Signed)
Tried to assist pt to urinate with urinal. He asked that I leave the urinal with him.

## 2018-06-07 NOTE — ED Notes (Signed)
[facility] at beside.
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
## 2018-06-07 NOTE — Consult Note (Addendum)
Cardiology Consultation:   Patient ID: [name] MRN: [id number]; DOB: 25-May-1940  Admit date: 06/07/2018 Date of Consult: 06/07/2018  Primary Care Provider: Susy Frizzle, MD Primary Cardiologist: Sanda Klein, MD  Primary Electrophysiologist:  None    Patient Profile:   Bradley Meadows is a 78 y.o. male with a hx of squamous cell lunge cancer s/p chemo/xrt, COPD on home oxygen, tobacco abuse, GERD, hypertension, hyperlipidemia, PVCs and CAD who is being seen today for the evaluation of elevated troponin at the request of Dr. Reesa Meadows.  History of Present Illness:   Mr. Bradley Meadows was brought in by EMS after being found at home down on his kitchen floor by a neighbor. Per notes he was confused and had apparently taken his oxygen off to have a cigarette and either passed out of stumbled and fell. EMS found O2 sat in the 60's. He usually uses oxygen at 3L by nasal cannula. The patient had also been experiencing diarrhea for the prior 4 days. He had gotten progressively weak. Oxygen saturation and mentation improved when his Oxygen was resumed.   Per ED note, the patient was treated for squamous cell lung cancer in the fall and was supposed to have a follow up PET scan but was putting it off because he is concerned for recurrence of cancer as he has had rapid weight loss and also increased confusion. His daughter is worried about his mental capacity.   Mr. Bradley Meadows has a history of CAD.  Myoview obtained in March 2008 showed lateral wall scar and mildly reduced EF of 49%.  Cardiac catheterization April 2018 showed occluded left circumflex artery with collaterals, medical therapy was recommended.  Myoview obtained on 02/19/2016 showed EF 53, medium defect of moderate severity present in the basal inferolateral and apical lateral location, overall considered low risk study. Echocardiogram on 07/24/2017 which showed EF 40 to 45%, hypokinesis in the basal to mid inferior, inferolateral, anterolateral  myocardium, grade 1 DD, mild MR, severely dilated LAD, PA peak pressure 42 mmHg.  CT of head showed no acute abnormality. EKG non-ischemic. Troponin has trended up 0.93>3.26. BNP is 656.6.  Chest Xray showed Cardiomegaly with vascular congestion and probable interstitial edema. Bibasilar atelectasis or infiltrates. Cannot exclude pneumonia particularly in the right lung base.   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, right ([place]) 04/07/2016   PVC's (premature ventricular contractions)    2017   Squamous cell lung cancer, right ([place]) 04/07/2016    Past Surgical History:  Procedure Laterality Date   ESOPHAGOGASTRODUODENOSCOPY (EGD) WITH PROPOFOL N/A 02/26/2016   Procedure: ESOPHAGOGASTRODUODENOSCOPY (EGD) WITH PROPOFOL;  Surgeon: Irene Shipper, MD;  Location: [facility];  Service: Endoscopy;  Laterality: N/A;   EXPLORATORY LAPAROTOMY     for ulcer  "washed me out with a hose pipe"   EYE SURGERY     cataracts   HERNIA REPAIR  INGUINAL HERNIA REPAIR Left 09/26/2016   Procedure: HERNIA REPAIR INGUINAL LEFT;  Surgeon: Jackolyn Confer, MD;  Location: [facility];  Service: General;  Laterality: Left;  With MESH   INGUINAL HERNIA REPAIR Right 02/26/2017   Procedure: HERNIA REPAIR INGUINAL INCARCERATED WITH MESH;  Surgeon: Excell Seltzer, MD;  Location: [facility];  Service: General;  Laterality: Right;   INSERTION OF MESH Right 02/26/2017     Procedure: INSERTION OF MESH;  Surgeon: Excell Seltzer, MD;  Location: [facility];  Service: General;  Laterality: Right;   IR GENERIC HISTORICAL  04/10/2016   IR US GUIDE VASC ACCESS RIGHT 04/10/2016 Markus Daft, MD [facility]   IR GENERIC HISTORICAL  04/10/2016   IR FLUORO GUIDE PORT INSERTION RIGHT 04/10/2016 Markus Daft, MD [facility]   IR GENERIC HISTORICAL  04/10/2016   IR FLUORO RM 30-60 MIN 04/10/2016 Markus Daft, MD [facility]   IR GENERIC HISTORICAL  04/18/2016   IR FLUORO RM 30-60 MIN 04/18/2016 Greggory Keen, MD [facility]   JOINT REPLACEMENT     knee both   PORTACATH PLACEMENT Right 04/10/2016   VIDEO BRONCHOSCOPY WITH ENDOBRONCHIAL ULTRASOUND N/A 03/26/2016   Procedure: VIDEO BRONCHOSCOPY WITH ENDOBRONCHIAL ULTRASOUND;  Surgeon: Collene Gobble, MD;  Location: [facility];  Service: Thoracic;  Laterality: N/A;     Home Medications:  Prior to Admission medications   Medication Sig Start Date End Date Taking? Authorizing Provider  aspirin EC 81 MG tablet Take 81 mg by mouth daily.   Yes [provider]  atorvastatin (LIPITOR) 40 MG tablet TAKE 1 TABLET(40 MG) BY MOUTH DAILY Patient taking differently: Take 40 mg by mouth daily.  05/08/17  Yes Pickard, Cammie Mcgee, MD  CARAFATE 1 GM/10ML suspension TAKE 10 MLS BY MOUTH TWICE DAILY Patient taking differently: Take 1 g by mouth 2 (two) times daily.  01/28/18  Yes Susy Frizzle, MD  HYDROcodone-acetaminophen (NORCO/VICODIN) 5-325 MG tablet Take 1 tablet by mouth every 6 (six) hours as needed for severe pain. 05/20/18  Yes Susy Frizzle, MD  Ipratropium-Albuterol (COMBIVENT RESPIMAT) 20-100 MCG/ACT AERS respimat INHALE 1 PUFF BY MOUTH EVERY 6 HOURS AS NEEDED FOR WHEEZING Patient taking differently: Inhale 1 puff into the lungs every 6 (six) hours as needed for wheezing or shortness of breath.  12/02/17  Yes Susy Frizzle, MD  ipratropium-albuterol (DUONEB) 0.5-2.5 (3) MG/3ML SOLN USE 1 VIAL VIA NEBULIZER EVERY 6 HOURS AS  NEEDED FOR SHORTNESS OF BREATH OR WHEEZING Patient taking differently: Inhale 3 mLs into the lungs every 6 (six) hours as needed (SOB, wheezing).  12/03/17  Yes Susy Frizzle, MD  meclizine (ANTIVERT) 12.5 MG tablet TAKE 1 TABLET(12.5 MG) BY MOUTH THREE TIMES DAILY AS NEEDED FOR DIZZINESS Patient taking differently: Take 12.5 mg by mouth 3 (three) times daily as needed for dizziness.  03/05/18  Yes Volanda Napoleon, MD  Misc. Devices (TRANSFER BENCH) MISC Shower transfer bench 12/23/17  Yes Volanda Napoleon, MD  Multiple Vitamin (MULTIVITAMIN) tablet Take 1 tablet by mouth daily. Centrum Silver   Yes [provider]  omeprazole (PRILOSEC) 20 MG capsule Take 1 capsule (20 mg total) by mouth daily. 03/31/17  Yes Susy Frizzle, MD  STIOLTO RESPIMAT 2.5-2.5 MCG/ACT AERS INHALE 2 PUFFS INTO THE LUNGS DAILY Patient taking differently: Inhale 2 puffs into the lungs daily.  03/08/18  Yes Susy Frizzle, MD  Pyridoxine HCl (VITAMIN B-6) 250 MG tablet Take 1 tablet (250 mg total) by mouth daily. Patient not taking:  Reported on 06/07/2018 07/10/17   Volanda Napoleon, MD    Inpatient Medications: Scheduled Meds:  aspirin EC  81 mg Oral Daily   aspirin  325 mg Oral Once   atorvastatin  40 mg Oral Daily   furosemide  80 mg Intravenous Once   pantoprazole  40 mg Oral Daily   sucralfate  1 g Oral BID   Continuous Infusions:  heparin 850 Units/hr (06/07/18 0445)   PRN Meds: acetaminophen **OR** acetaminophen, bisacodyl, HYDROcodone-acetaminophen, Ipratropium-Albuterol, ipratropium-albuterol, senna-docusate  Allergies:    Allergies  Allergen Reactions   No Known Allergies     Social History:   Social History   Socioeconomic History   Marital status: Divorced    Spouse name: Not on file   Number of children: 3   Years of education: 9   Highest education level: Not on file  Occupational History   Occupation: Retired- Agricultural consultant: [employer] resource strain: Not on file   Food insecurity:    Worry: Not on file    Inability: Not on file   Transportation needs:    Medical: Not on file    Non-medical: Not on file  Tobacco Use   Smoking status: Former Smoker    Packs/day: 1.50    Years: 30.00    Pack years: 45.00    Types: Cigarettes    Last attempt to quit: 09/29/2015    Years since quitting: 2.6   Smokeless tobacco: Never Used  Substance and Sexual Activity   Alcohol use: No   Drug use: No   Sexual activity: Not Currently  Lifestyle   Physical activity:    Days per week: Not on file    Minutes per session: Not on file   Stress: Not on file  Relationships   Social connections:    Talks on phone: Not on file    Gets together: Not on file    Attends religious service: Not on file    Active member of club or organization: Not on file    Attends meetings of clubs or organizations: Not on file    Relationship status: Not on file   Intimate partner violence:    Fear of current or ex partner: Not on file    Emotionally abused: Not on file    Physically abused: Not on file    Forced sexual activity: Not on file  Other Topics Concern   Not on file  Social History Narrative   Grew up in [place] area.    Left school after 9th grade. No father and had to work.    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       Health Care POA:    Emergency Contact: sister, Hector Brunswick (h) [phone]   End of Life Plan:    Who lives with you: self   Any pets: Lorrin Goodell   Diet: Pt limits sugars and starches and focuses on vegetables an protein. Pt has lost 50 lbs over several years.   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   Seatbelts: Pt reports wearing seatbelt when in vehicle.   Sun Exposure/Protection: Pt does not use sun protectin.   Hobbies: Racing, TV, gardening, Architect          Family History:    Family History   Problem Relation Age of Onset   Arthritis Mother    COPD Mother  Alcohol abuse Father    Breast cancer Sister    Heart disease Paternal Grandfather    Hyperlipidemia Sister      ROS:  Please see the history of present illness.   All other ROS reviewed and negative.     Physical Exam/Data:   Vitals:   06/07/18 0753 06/07/18 0800 06/07/18 0830 06/07/18 0900  BP: 118/70 124/64 117/62 113/61  Pulse: 83   65  Resp: (!) 26 19 19 19   Temp:      TempSrc:      SpO2: 97%   99%  Weight:      Height:        Intake/Output Summary (Last 24 hours) at 06/07/2018 0915 Last data filed at 06/07/2018 0734 Gross per 24 hour  Intake 18 ml  Output 500 ml  Net -482 ml   Last 3 Weights 06/07/2018 12/18/2017 12/18/2017  Weight (lbs) 153 lb 161 lb 13 oz 161 lb 13 oz  Weight (kg) 69.4 kg 73.398 kg 73.398 kg     Body mass index is 20.75 kg/m.   General: somnolent, on O2 via [place], cachectic HEENT: normal Lymph: no adenopathy Neck: no JVD Endocrine:  No thryomegaly Vascular: No carotid bruits; FA pulses 2+ bilaterally without bruits  Cardiac:  normal S1, S2; RRR, no murmur  Lungs:  clear to auscultation bilaterally, no wheezing, rhonchi or rales  Abd: soft, nontender, no hepatomegaly  Ext: 2+ edema B/L, possible component of lympedema Musculoskeletal:  No deformities, BUE and BLE strength normal and equal Skin: warm and dry  Neuro:  CNs 2-12 intact, no focal abnormalities noted Psych:  Normal affect   EKG:  The EKG was personally reviewed and demonstrates:  Sinus rhythm, 84 bpm, Multiple premature complexes, vent & supraven, RBBB and LAFB Telemetry:  Off telemetry  Relevant CV Studies:  Myoview 02/19/2016 Study Highlights    The left ventricular ejection fraction is normal (55-65%).  Nuclear stress EF: 53%. Visually,  Defect 1: There is a medium defect of moderate severity present in the basal inferolateral and apical lateral location.  The study is normal.  This is a low  risk study.    Echo 07/24/2017 LV EF: 40% - 45% Study Conclusions  - Left ventricle: The cavity size was mildly dilated. Systolic function was mildly to moderately reduced. The estimated ejection fraction was in the range of 40% to 45%. Hypokinesis in the basal to mid inferior, inferolateral, and anterolateral myocardium. Doppler parameters are consistent with abnormal left ventricular relaxation (grade 1 diastolic dysfunction). - Aortic valve: Transvalvular velocity was within the normal range. There was no stenosis. There was no regurgitation. - Mitral valve: Moderately calcified annulus. Transvalvular velocity was within the normal range. There was no evidence for stenosis. There was mild regurgitation. - Left atrium: The atrium was severely dilated. - Right ventricle: The cavity size was normal. Wall thickness was normal. Systolic function was normal. - Right atrium: The atrium was moderately dilated. - Tricuspid valve: There was trivial regurgitation. - Pulmonary arteries: Systolic pressure was mildly increased. PA peak pressure: 42 mm Hg (S).    Laboratory Data:  Chemistry Recent Labs  Lab 06/07/18 0125  NA 131*  K 3.9  CL 92*  CO2 30  GLUCOSE 109*  BUN 19  CREATININE 0.69  CALCIUM 8.4*  GFRNONAA >60  GFRAA >60  ANIONGAP 9    Recent Labs  Lab 06/07/18 0125  PROT 5.8*  ALBUMIN 3.5  AST 53*  ALT 24  ALKPHOS 63  BILITOT  1.5*   Hematology Recent Labs  Lab 06/07/18 0125  WBC 10.9*  RBC 3.60*  HGB 11.5*  HCT 35.3*  MCV 98.1  MCH 31.9  MCHC 32.6  RDW 13.6  PLT 144*   Cardiac Enzymes Recent Labs  Lab 06/07/18 0125 06/07/18 0804  TROPONINI 0.93* 3.26*   No results for input(s): TROPIPOC in the last 168 hours.  BNP Recent Labs  Lab 06/07/18 0125  BNP 656.6*    DDimer No results for input(s): DDIMER in the last 168 hours.  Radiology/Studies:  Ct Head Wo Contrast  Result Date: 06/07/2018 CLINICAL DATA:  78 y/o M;  fall with head and neck trauma. History of lung cancer. EXAM: CT HEAD WITHOUT CONTRAST CT CERVICAL SPINE WITHOUT CONTRAST TECHNIQUE: Multidetector CT imaging of the head and cervical spine was performed following the standard protocol without intravenous contrast. Multiplanar CT image reconstructions of the cervical spine were also generated. COMPARISON:  12/11/2017 PET-CT. FINDINGS: CT HEAD FINDINGS Brain: No evidence of acute infarction, hemorrhage, hydrocephalus, extra-axial collection or mass lesion/mass effect. Few nonspecific white matter hypodensities are compatible with mild chronic microvascular ischemic changes and there is mild volume loss of the brain. Vascular: Calcific atherosclerosis of carotid siphons. Skull: Normal. Negative for fracture or focal lesion. Sinuses/Orbits: No acute finding. Other: Bilateral intra-ocular lens replacement. CT CERVICAL SPINE FINDINGS Alignment: Mild reversal of cervical curvature with apex at C6. C4-5 and C5-6 grade 1 anterolisthesis. Skull base and vertebrae: No acute fracture. No primary bone lesion or focal pathologic process. Soft tissues and spinal canal: No prevertebral fluid or swelling. No visible canal hematoma. Disc levels: Productive changes of the articulation of anterior arch of C1, odontoid, and basion. Right-greater-than-left facet hypertrophy and multilevel discogenic degenerative changes greatest at C5-C7. Uncovertebral and facet hypertrophy contribute to bony neural foraminal stenosis at the bilateral C2-3, right C3-4, right greater than left C4-5, right greater than left C5-6, bilateral C6-7 levels. No high-grade bony spinal canal stenosis. Upper chest: Centrilobular emphysema. Other: Calcific atherosclerosis of carotid bifurcations. IMPRESSION: 1. No acute intracranial abnormality or calvarial fracture. 2. No acute fracture or dislocation of the cervical spine. 3. Mild chronic microvascular ischemic changes and volume loss of the brain. 4. Moderate  cervical spondylosis greatest at C5-C7 levels. 5.  Emphysema (ICD10-J43.9). Electronically Signed   By: Kristine Garbe M.D.   On: 06/07/2018 01:17   Ct Cervical Spine Wo Contrast  Result Date: 06/07/2018 CLINICAL DATA:  78 y/o M; fall with head and neck trauma. History of lung cancer. EXAM: CT HEAD WITHOUT CONTRAST CT CERVICAL SPINE WITHOUT CONTRAST TECHNIQUE: Multidetector CT imaging of the head and cervical spine was performed following the standard protocol without intravenous contrast. Multiplanar CT image reconstructions of the cervical spine were also generated. COMPARISON:  12/11/2017 PET-CT. FINDINGS: CT HEAD FINDINGS Brain: No evidence of acute infarction, hemorrhage, hydrocephalus, extra-axial collection or mass lesion/mass effect. Few nonspecific white matter hypodensities are compatible with mild chronic microvascular ischemic changes and there is mild volume loss of the brain. Vascular: Calcific atherosclerosis of carotid siphons. Skull: Normal. Negative for fracture or focal lesion. Sinuses/Orbits: No acute finding. Other: Bilateral intra-ocular lens replacement. CT CERVICAL SPINE FINDINGS Alignment: Mild reversal of cervical curvature with apex at C6. C4-5 and C5-6 grade 1 anterolisthesis. Skull base and vertebrae: No acute fracture. No primary bone lesion or focal pathologic process. Soft tissues and spinal canal: No prevertebral fluid or swelling. No visible canal hematoma. Disc levels: Productive changes of the articulation of anterior arch of C1, odontoid, and  basion. Right-greater-than-left facet hypertrophy and multilevel discogenic degenerative changes greatest at C5-C7. Uncovertebral and facet hypertrophy contribute to bony neural foraminal stenosis at the bilateral C2-3, right C3-4, right greater than left C4-5, right greater than left C5-6, bilateral C6-7 levels. No high-grade bony spinal canal stenosis. Upper chest: Centrilobular emphysema. Other: Calcific atherosclerosis of  carotid bifurcations. IMPRESSION: 1. No acute intracranial abnormality or calvarial fracture. 2. No acute fracture or dislocation of the cervical spine. 3. Mild chronic microvascular ischemic changes and volume loss of the brain. 4. 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 5.  Emphysema (ICD10-J43.9). Electronically Signed   By: Kristine Garbe M.D.   On: 06/07/2018 01:17   Dg Chest Port 1 View  Result Date: 06/07/2018 CLINICAL DATA:  Weakness, syncope EXAM: PORTABLE CHEST 1 VIEW COMPARISON:  PET CT 12/11/2017 FINDINGS: Right Port-A-Cath in place with the tip in the right atrium. Cardiomegaly with vascular congestion. Interstitial prominence likely reflects interstitial edema. Bibasilar atelectasis or infiltrates. No effusions. No acute bony abnormality. IMPRESSION: Cardiomegaly with vascular congestion and probable interstitial edema. Bibasilar atelectasis or infiltrates. Cannot exclude pneumonia particularly in the right lung base. Electronically Signed   By: Rolm Baptise M.D.   On: 06/07/2018 03:52    Assessment and Plan:   1. Elevated troponin -Pt presented with hypoxemia after being found down at his home off his oxygen. Initially had altered mental status.  - Cardiac catheterization April 2018 showed occluded left circumflex artery with collaterals, medical therapy was recommended. - the patient denies any recent chest pains or SOB -Troponin initially mildly elevated at 0.93, rose to 3.26.  -EKG with no acute ischemic changes.  -Pt is on heparin drip for possible ACS.  -Myocardial damage may be due to hypoxemia (O2 sats in 60's) of unknown duration.   -An echo has been ordered for further evaluation of acute changes.  -Pt will need to have consistent oxygen therapy to maintain oxygenation to organs including heart.  - given that the patient has known lung cancer with no recent scans (missed PET scan), he is an ongoing smoker and has lost 20 lbs in the last year, in the  lack of symptoms he is a poor candidate for invasive therapies. I would follow troponin trend and echo - unless significant troponin elevation, new symptoms or significant decrease in LVEF I would not proceed with a cardiac catheterization. Continue iv heparin until troponin is downtrending  2. Coronary artery disease -as above  -Continue aspirin and Lipitor.  3. Acute on chronic systolic CHF -Last echocardiogram 07/2017 showed ejection fraction stable at 40 to 45%. -BNP 656.6.  -Pt was given lasix 20 mg IV X1. I would continue lasix 40 mg iv BID x 2 doses then re-evaluate -Appears to be having good urine output. -continue to monitor I&O, daily weight, symptoms.  4. Acute on chronic respiratory failure/COPD/hx lung cancer -Pt found off his chronic oxygen therapy and hypoxic with confusion.  -Improved with supplemental oxygen.  - continues to smoke  5. Hypertension -BP currently well controlled.   6. Hyperlipidemia -On lipitor 40 mg daily.  -last lipid panel in 10/2017 showed LDL of 62 which at goal of <70.   7. Tobacco abuse -Tobacco cessation would be optimal but pt seems to have been unsuccessful with cessation.   For questions or updates, please contact [place] Please consult [email or web address] for contact info under   Signed, Ena Dawley, MD 06/07/2018 9:15 AM

## 2018-06-07 NOTE — ED Notes (Signed)
Pt currently denies chest pain, pt resting comfortably.

## 2018-06-08 DIAGNOSIS — I214 Non-ST elevation (NSTEMI) myocardial infarction: Principal | ICD-10-CM

## 2018-06-08 LAB — COMPREHENSIVE METABOLIC PANEL
ALT: 32 U/L (ref 0–44)
AST: 94 U/L — ABNORMAL HIGH (ref 15–41)
Albumin: 2.8 g/dL — ABNORMAL LOW (ref 3.5–5.0)
Alkaline Phosphatase: 56 U/L (ref 38–126)
Anion gap: 12 (ref 5–15)
BUN: 18 mg/dL (ref 8–23)
CO2: 32 mmol/L (ref 22–32)
Calcium: 8.5 mg/dL — ABNORMAL LOW (ref 8.9–10.3)
Chloride: 88 mmol/L — ABNORMAL LOW (ref 98–111)
Creatinine, Ser: 0.74 mg/dL (ref 0.61–1.24)
GFR calc Af Amer: >60 mL/min (ref >60)
GFR calc non Af Amer: >60 mL/min (ref >60)
Glucose, Bld: 93 mg/dL (ref 70–99)
Potassium: 3.3 mmol/L — ABNORMAL LOW (ref 3.5–5.1)
Sodium: 132 mmol/L — ABNORMAL LOW (ref 135–145)
Total Bilirubin: 0.7 mg/dL (ref 0.3–1.2)
Total Protein: 4.7 g/dL — ABNORMAL LOW (ref 6.5–8.1)

## 2018-06-08 LAB — CBC
HCT: 33.2 % — ABNORMAL LOW (ref 39.0–52.0)
Hemoglobin: 11.2 g/dL — ABNORMAL LOW (ref 13.0–17.0)
MCH: 31.2 pg (ref 26.0–34.0)
MCHC: 33.7 g/dL (ref 30.0–36.0)
MCV: 92.5 fL (ref 80.0–100.0)
Platelets: 144 10*3/uL — ABNORMAL LOW (ref 150–400)
RBC: 3.59 MIL/uL — ABNORMAL LOW (ref 4.22–5.81)
RDW: 13.5 % (ref 11.5–15.5)
WBC: 7.9 10*3/uL (ref 4.0–10.5)
nRBC: 0 % (ref 0.0–0.2)

## 2018-06-08 LAB — TROPONIN I: Troponin I: 2.49 ng/mL (ref <0.03)

## 2018-06-08 LAB — PROTIME-INR
INR: 1.3 — ABNORMAL HIGH (ref 0.8–1.2)
Prothrombin Time: 15.8 seconds — ABNORMAL HIGH (ref 11.4–15.2)

## 2018-06-08 LAB — HEPARIN LEVEL (UNFRACTIONATED)
Heparin Unfractionated: <0.1 IU/mL — ABNORMAL LOW (ref 0.30–0.70)
Heparin Unfractionated: <0.1 IU/mL — ABNORMAL LOW (ref 0.30–0.70)
Heparin Unfractionated: 0.18 IU/mL — ABNORMAL LOW (ref 0.30–0.70)

## 2018-06-08 LAB — APTT: aPTT: 53 seconds — ABNORMAL HIGH (ref 24–36)

## 2018-06-08 MED ORDER — HEPARIN BOLUS VIA INFUSION
2000.0000 [IU] | Freq: Once | INTRAVENOUS | Status: AC
  Administered 2018-06-08: 21:00:00 2000 [IU] via INTRAVENOUS
  Filled 2018-06-08: qty 2000

## 2018-06-08 MED ORDER — HEPARIN BOLUS VIA INFUSION
2000.0000 [IU] | Freq: Once | INTRAVENOUS | Status: AC
  Administered 2018-06-08: 02:00:00 2000 [IU] via INTRAVENOUS
  Filled 2018-06-08: qty 2000

## 2018-06-08 MED ORDER — METOPROLOL TARTRATE 12.5 MG HALF TABLET
12.5000 mg | ORAL_TABLET | Freq: Two times a day (BID) | ORAL | Status: DC
  Administered 2018-06-08 – 2018-06-14 (×12): 12.5 mg via ORAL
  Filled 2018-06-08 (×12): qty 1

## 2018-06-08 NOTE — Progress Notes (Signed)
Patient Demographics:    Bradley Meadows, is a 78 y.o. male, DOB - 1940-03-11, YIR:[id number]  Admit date - 06/07/2018   Admitting Physician Ankit Arsenio Loader, MD  Outpatient Primary MD for the patient is Susy Frizzle, MD  LOS - 1   Chief Complaint  Patient presents with   Weakness        Subjective:    Bradley Meadows today has no fevers, no emesis,  No chest pain, shortness of breath is better,.. 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 Cell lung cancer, COPD, ongoing cigarette smoking, chronic hypoxic respiratory failure on home O2, HTN, CAD admitted 06/07/18 after being found down , O2 sats were 60's, troponins are elevated  Echo reveals moderately reduced LV function with EF 35-40%, cardiology recommends medical management, PT recommends SNF rehab.   A/p 1)NSTEMI/CAD--- troponin peaked at 3.26, patient is currently chest pain-free , cardiology consult appreciated,  after cardiologist discussed plan with patient's daughter [name].... medical management advised, continue aspirin, metoprolol, Lipitor, IV heparin for 48 hours--cardiology consult appreciated  2)HFrEF--- Repeat Echo from 06/07/2018 with EF of 35 to 50% diastolic dysfunction- patient has combined diastolic and systolic dysfunction CHF exacerbation, continue IV Lasix 40 mg twice daily, daily weight and fluid output with  monitoring  3) COPD--- stable, looking cessation advised, continue bronchodilators, no acute exacerbation at this time, hold off on steroids  4)Rt Lung SCC Lung Cancer-patient with recent weight loss, follow-up with oncologist post discharge advised.... Apparently patient was supposed to get a PET scan as outpatient for reevaluation of possible lung cancer recurrence soon  5)Chronic hypoxic respiratory failure--- continue to #1 #2 #3 and # 4 above...., at Baseline patient is on 4 L of oxygen via nasal cannula  6)Generalized weakness/Falls--- PT recommends SNF rehab, PTA patient lives alone  Disposition/Need for in-Hospital Stay- patient unable to be discharged at this time due to SNF Rehab  Code Status : FULL  Family Communication:   Na   Disposition Plan  : SNF Rehab  Consults  :  Cardiology  DVT Prophylaxis  :  Iv Heparin  Lab Results  Component Value Date   PLT 144 (L) 06/08/2018    Inpatient Medications  Scheduled Meds:  aspirin EC  81 mg Oral Daily   atorvastatin  40 mg Oral Daily   furosemide  40 mg Intravenous BID   furosemide  80 mg Intravenous Once   metoprolol tartrate  12.5 mg Oral BID   pantoprazole  40 mg Oral Daily   sucralfate  1 g Oral BID AC   Continuous Infusions:  heparin 1,450 Units/hr (06/08/18 1658)   PRN Meds:.acetaminophen **OR** acetaminophen, bisacodyl, HYDROcodone-acetaminophen, ipratropium-albuterol, senna-docusate  Anti-infectives (From admission, onward)   None       Objective:   Vitals:   06/08/18 0428 06/08/18 0855 06/08/18 1202 06/08/18 1436  BP: 109/74 (!) 105/55 (!) 109/57 (!) 99/50  Pulse: 74 65 88 90  Resp: (!) 26 (!) 21 (!) 21 (!) 26  Temp: 98.9 F (37.2 C) 98.2 F (36.8 C)  97.7 F (36.5 C)  TempSrc: Oral Oral  Oral  SpO2: 98% 93% 94% 100%  Weight: 68.6 kg     Height:        Wt Readings from Last 3 Encounters:  06/08/18 68.6 kg  12/18/17 73.4 kg  12/18/17 73.4 kg     Intake/Output Summary (Last  24 hours) at 06/08/2018 1820 Last data filed at 06/08/2018 1608 Gross per 24 hour  Intake 1028.32 ml  Output 2625 ml  Net -1596.68 ml     Physical Exam Patient is examined daily including today on 06/08/2018 , exams remain the same as of yesterday except that has changed   Gen:- Awake Alert, in no acute distress HEENT:- [place].AT, No sclera icterus Nose- [place] 4 L/min Neck-Supple Neck,No JVD,.  Lungs-diminished in bases, no wheezing CV- S1, S2 normal, irregular, right subclavian area with Mediport Abd-  +ve B.Sounds, Abd Soft, No tenderness,    Extremity/Skin:-1+ edema, pedal pulses present  Psych-affect is appropriate, oriented x3 Neuro-generalized weakness, but no new focal deficits, no tremors   Data Review:   Micro Results Recent Results (from the past 240 hour(s))  SARS Coronavirus 2 (CEPHEID - Performed in [address] hospital lab), Hosp Order     Status: None   Collection Time: 06/07/18  3:21 AM  Result Value Ref Range Status   SARS Coronavirus 2 NEGATIVE NEGATIVE Final    Comment: (NOTE) If result is NEGATIVE SARS-CoV-2 target nucleic acids are NOT DETECTED. The SARS-CoV-2 RNA is generally detectable in upper and lower  respiratory specimens during the acute phase of infection. The lowest  concentration of SARS-CoV-2 viral copies this assay can detect is 250  copies / mL. A negative result does not preclude SARS-CoV-2 infection  and should not be used as the sole basis for treatment or other  patient management decisions.  A negative result may occur with  improper specimen collection / handling, submission of specimen other  than nasopharyngeal swab, presence of viral mutation(s) within the  areas targeted by this assay, and inadequate number of viral copies  (<250 copies / mL). A negative result must be combined with clinical  observations, patient history, and epidemiological information. If result is POSITIVE SARS-CoV-2 target nucleic acids are DETECTED. The SARS-CoV-2  RNA is generally detectable in upper and lower  respiratory specimens dur ing the acute phase of infection.  Positive  results are indicative of active infection with SARS-CoV-2.  Clinical  correlation with patient history and other diagnostic information is  necessary to determine patient infection status.  Positive results do  not rule out bacterial infection or co-infection with other viruses. If result is PRESUMPTIVE POSTIVE SARS-CoV-2 nucleic acids MAY BE PRESENT.   A presumptive positive result was obtained on the submitted specimen  and confirmed on repeat testing.  While 2019 novel coronavirus  (SARS-CoV-2) nucleic acids may be present in the submitted sample  additional confirmatory testing may be necessary for epidemiological  and / or clinical management purposes  to differentiate between  SARS-CoV-2 and other Sarbecovirus currently known to infect humans.  If clinically indicated additional testing with an alternate test  methodology (  [id number]) is advised. The SARS-CoV-2 RNA is generally  detectable in upper and lower respiratory sp ecimens during the acute  phase of infection. The expected result is Negative. Fact Sheet for Patients:  StrictlyIdeas.no Fact Sheet for Healthcare Providers: BankingDealers.co.za This test is not yet approved or cleared by the [place] FDA and has been authorized for detection and/or diagnosis of SARS-CoV-2 by FDA under an Emergency Use Authorization (EUA).  This EUA will remain in effect (meaning this test can be used) for the duration of the COVID-19 declaration under Section 564(b)(1) of the Act, 21 U.S.C. section 360bbb-3(b)(1), unless the authorization is terminated or revoked sooner. Performed at [facility], [place] [address] [place], [place] [address]     Radiology Reports Ct Head Wo Contrast  Result Date: 06/07/2018 CLINICAL DATA:  78 y/o M; fall with head and  neck trauma. History of lung cancer. EXAM: CT HEAD WITHOUT CONTRAST CT CERVICAL SPINE WITHOUT CONTRAST TECHNIQUE: Multidetector CT imaging of the head and cervical spine was performed following the standard protocol without intravenous contrast. Multiplanar CT image reconstructions of the cervical spine were also generated. COMPARISON:  12/11/2017 PET-CT. FINDINGS: CT HEAD FINDINGS Brain: No evidence of acute infarction, hemorrhage, hydrocephalus, extra-axial collection or mass lesion/mass effect. Few nonspecific white matter hypodensities are compatible with mild chronic microvascular ischemic changes and there is mild volume loss of the brain. Vascular: Calcific atherosclerosis of carotid siphons. Skull: Normal. Negative for fracture or focal lesion. Sinuses/Orbits: No acute finding. Other: Bilateral intra-ocular lens replacement. CT CERVICAL SPINE FINDINGS Alignment: Mild reversal of cervical curvature with apex at C6. C4-5 and C5-6 grade 1 anterolisthesis. Skull base and vertebrae: No acute fracture. No primary bone lesion or focal pathologic process. Soft tissues and spinal canal: No prevertebral fluid or swelling. No visible canal hematoma. Disc levels: Productive changes of the articulation of anterior arch of C1, odontoid, and basion. Right-greater-than-left facet hypertrophy and multilevel discogenic degenerative changes greatest at C5-C7. Uncovertebral and facet hypertrophy contribute to bony neural foraminal stenosis at the bilateral C2-3, right C3-4, right greater than left C4-5, right greater than left C5-6, bilateral C6-7 levels. No high-grade bony spinal canal stenosis. Upper chest: Centrilobular emphysema. Other: Calcific atherosclerosis of carotid bifurcations. IMPRESSION: 1. No acute intracranial abnormality or calvarial fracture. 2. No acute fracture or dislocation of the cervical spine. 3. Mild chronic microvascular ischemic changes and volume loss of the brain. 4. Moderate cervical spondylosis  greatest at C5-C7 levels. 5.  Emphysema (ICD10-J43.9). Electronically Signed   By: Kristine Garbe M.D.   On: 06/07/2018 01:17   Ct Cervical Spine Wo Contrast  Result Date: 06/07/2018 CLINICAL DATA:  78 y/o M; fall with head and neck trauma. History of lung cancer. EXAM: CT HEAD WITHOUT CONTRAST CT CERVICAL SPINE WITHOUT CONTRAST TECHNIQUE: Multidetector CT imaging of the head and cervical spine was performed following the standard protocol without intravenous contrast. Multiplanar CT image reconstructions of the cervical spine were also generated. COMPARISON:  12/11/2017 PET-CT. FINDINGS: CT HEAD FINDINGS Brain: No evidence of acute infarction, hemorrhage, hydrocephalus, extra-axial collection or mass lesion/mass effect. Few nonspecific white matter hypodensities are compatible with mild chronic microvascular ischemic changes and there is mild volume loss of the brain. Vascular: Calcific atherosclerosis of carotid siphons. Skull: Normal. Negative for fracture or focal lesion. Sinuses/Orbits: No acute finding. Other: Bilateral intra-ocular lens replacement. CT CERVICAL SPINE FINDINGS Alignment: Mild reversal of cervical curvature with apex at C6. C4-5 and C5-6 grade 1 anterolisthesis. Skull base and vertebrae: No acute fracture.  No primary bone lesion or focal pathologic process. Soft tissues and spinal canal: No prevertebral fluid or swelling. No visible canal hematoma. Disc levels: Productive changes of the articulation of anterior arch of C1, odontoid, and basion. Right-greater-than-left facet hypertrophy and multilevel discogenic degenerative changes greatest at C5-C7. Uncovertebral and facet hypertrophy contribute to bony neural foraminal stenosis at the bilateral C2-3, right C3-4, right greater than left C4-5, right greater than left C5-6, bilateral C6-7 levels. No high-grade bony spinal canal stenosis. Upper chest: Centrilobular emphysema. Other: Calcific atherosclerosis of carotid bifurcations.  IMPRESSION: 1. No acute intracranial abnormality or calvarial fracture. 2. No acute fracture or dislocation of the cervical spine. 3. Mild chronic microvascular ischemic changes and volume loss of the brain. 4. 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 5.  Emphysema (ICD10-J43.9). Electronically Signed   By: Kristine Garbe M.D.   On: 06/07/2018 01:17   Dg Chest Port 1 View  Result Date: 06/07/2018 CLINICAL DATA:  Weakness, syncope EXAM: PORTABLE CHEST 1 VIEW COMPARISON:  PET CT 12/11/2017 FINDINGS: Right Port-A-Cath in place with the tip in the right atrium. Cardiomegaly with vascular congestion. Interstitial prominence likely reflects interstitial edema. Bibasilar atelectasis or infiltrates. No effusions. No acute bony abnormality. IMPRESSION: Cardiomegaly with vascular congestion and probable interstitial edema. Bibasilar atelectasis or infiltrates. Cannot exclude pneumonia particularly in the right lung base. Electronically Signed   By: Rolm Baptise M.D.   On: 06/07/2018 03:52     CBC Recent Labs  Lab 06/07/18 0125 06/08/18 0014  WBC 10.9* 7.9  HGB 11.5* 11.2*  HCT 35.3* 33.2*  PLT 144* 144*  MCV 98.1 92.5  MCH 31.9 31.2  MCHC 32.6 33.7  RDW 13.6 13.5  LYMPHSABS 0.2*  --   MONOABS 0.8  --   EOSABS 0.0  --   BASOSABS 0.0  --     Chemistries  Recent Labs  Lab 06/07/18 0125 06/08/18 0014  NA 131* 132*  K 3.9 3.3*  CL 92* 88*  CO2 30 32  GLUCOSE 109* 93  BUN 19 18  CREATININE 0.69 0.74  CALCIUM 8.4* 8.5*  MG 1.7  --   AST 53* 94*  ALT 24 32  ALKPHOS 63 56  BILITOT 1.5* 0.7   ------------------------------------------------------------------------------------------------------------------ Recent Labs    06/07/18 0804  CHOL 124  HDL 47  LDLCALC 71  TRIG 32  CHOLHDL 2.6    Lab Results  Component Value Date   HGBA1C 5.5 06/07/2018    ------------------------------------------------------------------------------------------------------------------ Recent Labs    06/07/18 0804  TSH 1.222   ------------------------------------------------------------------------------------------------------------------ No results for input(s): VITAMINB12, FOLATE, FERRITIN, TIBC, IRON, RETICCTPCT in the last 72 hours.  Coagulation profile Recent Labs  Lab 06/07/18 0408 06/08/18 0014  INR 1.3* 1.3*    No results for input(s): DDIMER in the last 72 hours.  Cardiac Enzymes Recent Labs  Lab 06/07/18 1241 06/07/18 1830 06/08/18 0014  TROPONINI 2.43* 3.08* 2.49*   ------------------------------------------------------------------------------------------------------------------    Component Value Date/Time   BNP 656.6 (H) 06/07/2018 0125   BNP 33.0 08/14/2015 1255     Shakiah Wester M.D on 06/08/2018 at 6:20 PM  Go to [email or web address] - for contact info  [facility] - Office  [phone]

## 2018-06-08 NOTE — Evaluation (Signed)
Physical Therapy Evaluation Patient Details Name: Bradley Meadows MRN: [id number] DOB: 1940/08/14 Today's Date: 06/08/2018   History of Present Illness  Bradley Meadows is a 78 y.o. male with medical history significant of CAD, right lung  NCSL stage III, systolic CHF 78%, iron deficiency anemia, GERD, hypertension, active tobacco use, COPD on 4 L nasal cannula, hyperlipidemia was brought to the hospital for evaluation of syncope and hypoxia.  Clinical Impression  Pt was living alone PTA and has had several falls. Pt with decreased SpO2 with mobility and dizziness with standing and ambulation. Pt reports "I just fall to the ground when I get dizzy." Pt unsafe to be home alone at this time and would benefit from ST-SNF upon d/c to achieve safe mod I function. Acute PT to cont to follow.    Follow Up Recommendations SNF;Supervision/Assistance - 24 hour    Equipment Recommendations  (TBD at next venue)    Recommendations for Other Services       Precautions / Restrictions Precautions Precautions: Fall Precaution Comments: watch O2, dizziness Restrictions Weight Bearing Restrictions: No      Mobility  Bed Mobility Overal bed mobility: Needs Assistance Bed Mobility: Supine to Sit     Supine to sit: Min assist     General bed mobility comments: HOB elevated, minA for trunk elevation  Transfers Overall transfer level: Needs assistance Equipment used: Rolling walker (2 wheeled) Transfers: Sit to/from Stand Sit to Stand: Min assist         General transfer comment: minA to power up, pt c/o dizziness, BP 93/66 in sitting, supine 109/74, standing 96/48  Ambulation/Gait Ambulation/Gait assistance: Min assist Gait Distance (Feet): 10 Feet Assistive device: Rolling walker (2 wheeled) Gait Pattern/deviations: Step-through pattern;Decreased stride length;Trunk flexed;Narrow base of support Gait velocity: dec Gait velocity interpretation: <1.31 ft/sec, indicative of household  ambulator General Gait Details: pt c/o dizziness stating, "I'll just go out, you better hold onto me" Pt SpO2 dec to 84% on 4LO2 via [place]. Pt with noted SOB  Stairs            Wheelchair Mobility    Modified Rankin (Stroke Patients Only)       Balance Overall balance assessment: Needs assistance Sitting-balance support: Feet supported;No upper extremity supported Sitting balance-Leahy Scale: Fair Sitting balance - Comments: pt unable to [place] socks and states his daughter usually does it   Standing balance support: Bilateral upper extremity supported Standing balance-Leahy Scale: Poor Standing balance comment: dependent on RW at this time                             Pertinent Vitals/Pain Pain Assessment: No/denies pain    Home Living Family/patient expects to be discharged to:: Skilled nursing facility Living Arrangements: Alone               Additional Comments: pt lives alone in 1 story home with 3-5 steps to enter, son and daughter check on him frequently, was driving until recently    Prior Function Level of Independence: Independent with assistive device(s)         Comments: reports he was using a walker recently due freq falling episodes.      Hand Dominance   Dominant Hand: Right    Extremity/Trunk Assessment   Upper Extremity Assessment Upper Extremity Assessment: Generalized weakness    Lower Extremity Assessment Lower Extremity Assessment: Generalized weakness    Cervical / Trunk Assessment Cervical / Trunk  Assessment: Kyphotic(forward head)  Communication   Communication: No difficulties  Cognition Arousal/Alertness: Awake/alert Behavior During Therapy: WFL for tasks assessed/performed Overall Cognitive Status: Within Functional Limits for tasks assessed                                        General Comments General comments (skin integrity, edema, etc.): pt with bilat LE swelling, Pt with mild  orthostatic hypotension and noted dec SpO2 with mobility on 4LO2 via [place]    Exercises     Assessment/Plan    PT Assessment Patient needs continued PT services  PT Problem List Decreased strength;Decreased range of motion;Decreased activity tolerance;Decreased balance;Decreased mobility;Decreased knowledge of use of DME;Decreased safety awareness;Cardiopulmonary status limiting activity       PT Treatment Interventions DME instruction;Gait training;Stair training;Functional mobility training;Therapeutic activities;Therapeutic exercise;Balance training;Neuromuscular re-education;Cognitive remediation    PT Goals (Current goals can be found in the Care Plan section)  Acute Rehab PT Goals Patient Stated Goal: home PT Goal Formulation: With patient Time For Goal Achievement: 06/22/18 Potential to Achieve Goals: Good    Frequency Min 3X/week   Barriers to discharge Decreased caregiver support lives alone    Co-evaluation               AM-PAC PT "6 Clicks" Mobility  Outcome Measure Help needed turning from your back to your side while in a flat bed without using bedrails?: A Little Help needed moving from lying on your back to sitting on the side of a flat bed without using bedrails?: A Little Help needed moving to and from a bed to a chair (including a wheelchair)?: A Little Help needed standing up from a chair using your arms (e.g., wheelchair or bedside chair)?: A Little Help needed to walk in hospital room?: A Little Help needed climbing 3-5 steps with a railing? : A Lot 6 Click Score: 17    End of Session Equipment Utilized During Treatment: Gait belt;Oxygen(4Lo2 via [place]) Activity Tolerance: Patient limited by fatigue Patient left: in chair;with call bell/phone within reach;with chair alarm set;with nursing/sitter in room Nurse Communication: Mobility status(O2 sats) PT Visit Diagnosis: Unsteadiness on feet (R26.81);History of falling (Z91.81);Muscle weakness  (generalized) (M62.81);Difficulty in walking, not elsewhere classified (R26.2)    Time: 0175-1025 PT Time Calculation (min) (ACUTE ONLY): 36 min   Charges:   PT Evaluation $PT Eval Moderate Complexity: 1 Mod PT Treatments $Gait Training: 8-22 mins        Kittie Plater, PT, DPT Acute Rehabilitation Services Pager #: [phone] Office #: [phone]   Berline Lopes 06/08/2018, 10:35 AM

## 2018-06-08 NOTE — Progress Notes (Signed)
ANTICOAGULATION CONSULT NOTE  Pharmacy Consult for IV heparin Indication: chest pain/ACS  Patient Measurements: Height: 5\' 9"  (175.3 cm) Weight: 151 lb 3.8 oz (68.6 kg) IBW/kg (Calculated) : 70.7 Heparin Dosing Weight: actual 69.2 kg  Vital Signs: Temp: 97.7 F (36.5 C) (05/05 1436) Temp Source: Oral (05/05 1436) BP: 99/50 (05/05 1436) Pulse Rate: 90 (05/05 1436)  Labs: Recent Labs    06/07/18 0125 06/07/18 0408  06/07/18 1241  06/07/18 1830 06/08/18 0014 06/08/18 0931 06/08/18 2004  HGB 11.5*  --   --   --   --   --  11.2*  --   --   HCT 35.3*  --   --   --   --   --  33.2*  --   --   PLT 144*  --   --   --   --   --  144*  --   --   APTT  --  32  --   --   --   --  53*  --   --   LABPROT  --  16.0*  --   --   --   --  15.8*  --   --   INR  --  1.3*  --   --   --   --  1.3*  --   --   HEPARINUNFRC  --   --   --   --    < >  --  <0.10* <0.10* 0.18*  CREATININE 0.69  --   --   --   --   --  0.74  --   --   TROPONINI 0.93*  --    < > 2.43*  --  3.08* 2.49*  --   --    < > = values in this interval not displayed.    Assessment: 30 yoM found down in his kitchen by a neighbor.  Pharmacy consulted to dose IV heparin for ACS.  Heparin level is trending up but remains sub-therapeutic.  No issue with infusion nor bleeding per RN.  Goal of Therapy:  Heparin level 0.3-0.7 units/ml Monitor platelets by anticoagulation protocol: Yes   Plan:  Rebolus with 2000 units of IV heparin, then Increase heparin gtt to 1650 units/hr Check 8 hr heparin level  Niles Ess D. Mina Marble, PharmD, BCPS, [place] 06/08/2018, 8:40 PM

## 2018-06-08 NOTE — Consult Note (Signed)
   [facility] CM Inpatient Consult   06/08/2018  Bradley Meadows 1940-03-08 [id number]   Patient screened for high risk score and hospitalizations to check if potential [place] Management services are needed . Patient is in the Medicare [facility] and is eligible for community follow up services as appropriate. Chart review of this 78 yo with a HX of Squamous cell lung cancer, COPD with elevated troponin levels. Inpatient TOC RNCM made aware of patient's eligible.  Pateint had been outreached for services with [facility] on last month however patient did not feel services were needed.  See notes from screening. Continue to follow progress and disposition to assess for post hospital care management needs.    Please place a [facility] Care Management consult as appropriate and for questions contact:   Natividad Brood, RN BSN [facility] Liaison  [phone] business mobile phone Toll free office [phone]  Fax number: [phone] [name]  [email or web address]

## 2018-06-08 NOTE — TOC Initial Note (Signed)
Transition of Care (TOC) - Initial/Assessment Note  Bradley Gibbons RN, BSN Transitions of Care Unit 4E- RN Case Manager [phone]  Patient Details  Name: Bradley Meadows MRN: [id number] Date of Birth: 08-30-1940  Transition of Care [facility] [facility]) CM/SW Contact:    Bradley Patricia, RN Phone Number: [phone] 06/08/2018, 12:06 PM  Clinical Narrative:                 Pt admitted with NSTEMI, s/p fall at home, pt lives home alone, has home 02 at baseline 3L with [facility] per pt. CM spoke with pt at bedside regarding transition of care plans. Per PT eval recommendation for STSNF/24 hr- pt states he does not want to go to a rehab facility- he wants to return home- asks about getting services at home- discussed the differences between rehab and [facility] services for rehab. Pt gave CM permission to call daughter Bradley Meadows- encourage pt to think about STSNF and talk it over with daughter. TC made to Bradley Meadows- daughter- per conversation with kim- family does not live close to pt- closest is 20 min away. They all agree pt is not safe at home alone at this time- however in their conversations with pt he has not been agreeable to placement and wants to stay in his home. Discussed applying for PCS aides with his Medicaid benefits, also discussed [facility] benefit and potential HH services with [facility] and [facility] program. Daughter interested in this if pt continues to refuse STSNF and is eligible. CM will f/u with pt and daughter in AM.  CM has reached out to [place] with [facility]- and pt is eligible for [facility] services will make referral for community f/u.   Expected Discharge Plan: Skilled Nursing Facility Barriers to Discharge: Continued Medical Work up   Patient Goals and CMS Choice Patient states their goals for this hospitalization and ongoing recovery are:: "get back to my house" CMS Medicare.gov Compare Post Acute Care list provided to:: Patient Choice offered to / list presented to : Patient, Adult Children  Expected  Discharge Plan and Services Expected Discharge Plan: [place] In-house Referral: Clinical Social Work, [facility] Discharge Planning Services: CM Consult Post Acute Care Choice: [place] Living arrangements for the past 2 months: Single Family Home Expected Discharge Date: (unknown)                                    Prior Living Arrangements/Services Living arrangements for the past 2 months: Single Family Home Lives with:: Self Patient language and need for interpreter reviewed:: Yes        Need for Family Participation in Patient Care: Yes (Comment) Care giver support system in place?: Yes (comment) Current home services: DME(home 02-[employer], walker, lift chair) Criminal Activity/Legal Involvement Pertinent to Current Situation/Hospitalization: No - Comment as needed  Activities of Daily Living Home Assistive Devices/Equipment: Tub transfer bench, Cane (specify quad or straight), Walker (specify type), Oxygen, Nebulizer, Eyeglasses ADL Screening (condition at time of admission) Patient's cognitive ability adequate to safely complete daily activities?: No Is the patient deaf or have difficulty hearing?: No Does the patient have difficulty seeing, even when wearing glasses/contacts?: No Does the patient have difficulty concentrating, remembering, or making decisions?: Yes Patient able to express need for assistance with ADLs?: Yes Does the patient have difficulty dressing or bathing?: Yes Independently performs ADLs?: No Communication: Independent Dressing (OT): Dependent Is this a change from baseline?:  Change from baseline, expected to last >3 days Grooming: Dependent Is this a change from baseline?: Change from baseline, expected to last >3 days Feeding: Dependent Is this a change from baseline?: Change from baseline, expected to last >3 days Bathing: Dependent Is this a change from baseline?: Change from baseline, expected to last >3  days Toileting: Dependent Is this a change from baseline?: Change from baseline, expected to last >3days In/Out Bed: Dependent Is this a change from baseline?: Change from baseline, expected to last >3 days Walks in Home: Dependent Is this a change from baseline?: Change from baseline, expected to last >3 days Does the patient have difficulty walking or climbing stairs?: Yes Weakness of Legs: Both Weakness of Arms/Hands: Both  Permission Sought/Granted Permission sought to share information with : Case Manager, Family Supports, Chartered certified accountant granted to share information with : Yes, Verbal Permission Granted  Share Information with NAME: Bradley Meadows     Permission granted to share info w Relationship: daughter  Permission granted to share info w Contact Information: [phone]  Emotional Assessment Appearance:: Appears stated age Attitude/Demeanor/Rapport: Engaged Affect (typically observed): Appropriate, Pleasant Orientation: : Oriented to Self, Oriented to Situation, Oriented to Place, Oriented to  Time Alcohol / Substance Use: Tobacco Use Psych Involvement: No (comment)  Admission diagnosis:  NSTEMI (non-ST elevated myocardial infarction) ([place]) [I21.4] Syncope, unspecified syncope type [R55] Patient Active Problem List   Diagnosis Date Noted  . NSTEMI (non-ST elevated myocardial infarction) ([address]) 06/07/2018  . IDA (iron deficiency anemia) 06/15/2017  . Pressure injury of skin 02/27/2017  . Malnutrition of moderate degree 02/27/2017  . Port-A-Cath in place 12/26/2016  . Squamous cell lung cancer, right ([place]) 04/07/2016  . Lung cancer, primary, 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, right ([place]) 04/07/2016  . Goals of care, counseling/discussion 04/07/2016  . Esophageal mass   . Abnormal CT of the chest   . Gastroparesis   . History of peptic ulcer 04/13/2014  . Depression 06/22/2013  . Incarcerated right inguinal hernia 01/14/2013  .  Anemia 06/03/2012  . Chronic hyponatremia 06/03/2012  . Dizziness 05/26/2012  . Tobacco abuse 08/29/2010  . Mixed hyperlipidemia 03/26/2007  . Chronic systolic heart failure ([place]) 12/03/2006  . CAD (coronary artery disease), native coronary artery 09/04/2006  . COPD mixed type ([place]) 04/21/2006  . ERECTILE DYSFUNCTION 04/02/2006  . HYPERTENSION, BENIGN SYSTEMIC 04/02/2006  . OSTEOARTHRITIS, MULTI SITES 04/02/2006   PCP:  Susy Frizzle, MD Pharmacy:   [facility] [place], [address] [place] [address] Phone: [phone] Fax: [phone]     Social Determinants of Health (SDOH) Interventions    Readmission Risk Interventions No flowsheet data found.

## 2018-06-08 NOTE — Progress Notes (Signed)
ANTICOAGULATION CONSULT NOTE - Follow up  Pharmacy Consult for IV heparin Indication: chest pain/ACS  Allergies  Allergen Reactions  . No Known Allergies     Patient Measurements: Height: 5\' 9"  (175.3 cm) Weight: 152 lb 8.9 oz (69.2 kg) IBW/kg (Calculated) : 70.7 Heparin Dosing Weight: actual 69.2 kg  Vital Signs: Temp: 98.2 F (36.8 C) (05/04 2101) Temp Source: Oral (05/04 2101) BP: 103/51 (05/04 2101) Pulse Rate: 97 (05/04 2101)  Labs: Recent Labs    06/07/18 0125 06/07/18 0408  06/07/18 1241 06/07/18 1456 06/07/18 1830 06/08/18 0014  HGB 11.5*  --   --   --   --   --  11.2*  HCT 35.3*  --   --   --   --   --  33.2*  PLT 144*  --   --   --   --   --  144*  APTT  --  32  --   --   --   --  53*  LABPROT  --  16.0*  --   --   --   --  15.8*  INR  --  1.3*  --   --   --   --  1.3*  HEPARINUNFRC  --   --   --   --  <0.10*  --  <0.10*  CREATININE 0.69  --   --   --   --   --  0.74  TROPONINI 0.93*  --    < > 2.43*  --  3.08* 2.49*   < > = values in this interval not displayed.    Estimated Creatinine Clearance: 74.5 mL/min (by C-G formula based on SCr of 0.74 mg/dL).   Medical History: Past Medical History:  Diagnosis Date  . Arthritis   . Asthma   . CHF (congestive heart failure) (HCC)    ef=45-50%  . COPD (chronic obstructive pulmonary disease) ([place])    home O2  . Coronary artery disease    40-50% mLAD, 40% pRCA, 30% mRCA, occluded CX with retrograde filling 06/01/06 cath  . GERD (gastroesophageal reflux disease)    ulcers  . Goals of care, counseling/discussion 04/07/2016  . History of external beam radiation therapy 04/24/16-06/04/16   chest 60 Gy in 30 fractions  . Hyperlipidemia   . Hypertension   . Lung cancer, primary, 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, right ([place]) 04/07/2016  . PVC's (premature ventricular contractions)    2017  . Squamous cell lung cancer, right ([place]) 04/07/2016    Assessment: 37 yoM found down in his kitchen by a neighbor.  Troponin = 0.93 PTA asa, lipitor, no anticoagulants.   Heparin level undetectable s/p rate increase, RN reports no issues with infusion and confirms rate from last increase.    Goal of Therapy:  Heparin level 0.3-0.7 units/ml Monitor platelets by anticoagulation protocol: Yes   Plan:  Give heparin 2000 units IV x1, and increase gtt to 1250 units/hr F/u 8 hour heparin level  Bertis Ruddy, PharmD Clinical Pharmacist Please check AMION for all [place] numbers 06/08/2018 1:52 AM

## 2018-06-08 NOTE — Evaluation (Signed)
Occupational Therapy Evaluation Patient Details Name: Bradley Meadows MRN: [id number] DOB: Aug 31, 1940 Today's Date: 06/08/2018    History of Present Illness Bradley Meadows is a 78 y.o. male with medical history significant of CAD, right lung  NCSL stage III, systolic CHF 40%, iron deficiency anemia, GERD, hypertension, active tobacco use, COPD on 4 L nasal cannula, hyperlipidemia was brought to the hospital for evaluation of syncope and hypoxia.   Clinical Impression   This 78 y/o male presents with the above. PTA pt reports he was mod independent with ADL and functional mobility using RW, reports was driving, living alone. Pt presenting with generalized weakness, decreased activity tolerance and functional performance. Pt currently requiring minA for functional transfers using RW, minguard/supervision for seated UB ADL, modA for LB ADL. Pt on 4L O2 during session with lowest SpO2 noted 83% with standing activity, returning to >90% with seated rest and cues for deep breathing. BP in sitting start of session 115/86, initially upon standing 109/57. He will benefit from continued acute OT services and recommend follow up therapy services in SNF setting after discharge to maximize his safety and independence with ADL and mobility prior to return home. Will follow.     Follow Up Recommendations  SNF;Supervision/Assistance - 24 hour    Equipment Recommendations  Other (comment)(TBA)           Precautions / Restrictions Precautions Precautions: Fall Precaution Comments: watch O2, dizziness Restrictions Weight Bearing Restrictions: No      Mobility Bed Mobility               General bed mobility comments: received sitting up in recliner  Transfers Overall transfer level: Needs assistance Equipment used: Rolling walker (2 wheeled) Transfers: Sit to/from Stand Sit to Stand: Min assist;Min guard         General transfer comment: pt insisting on performing on his own; use of rocking  momentum to rise and steady at RW; light minA to assist with controlled descent when returning to sitting    Balance Overall balance assessment: Needs assistance Sitting-balance support: Feet supported;No upper extremity supported Sitting balance-Leahy Scale: Fair     Standing balance support: Bilateral upper extremity supported Standing balance-Leahy Scale: Poor Standing balance comment: dependent on RW at this time                           ADL either performed or assessed with clinical judgement   ADL Overall ADL's : Needs assistance/impaired Eating/Feeding: Set up;Sitting   Grooming: Set up;Min guard;Sitting   Upper Body Bathing: Min guard;Sitting   Lower Body Bathing: Minimal assistance;Sit to/from stand   Upper Body Dressing : Min guard;Set up;Sitting   Lower Body Dressing: Moderate assistance;Sit to/from stand   Toilet Transfer: Minimal assistance;Stand-pivot;RW Armed forces technical officer Details (indicate cue type and reason): simulated via transfer to/from recliner; pt practicing taking multiple steps foward and back using RW Toileting- Clothing Manipulation and Hygiene: Minimal assistance;Sit to/from stand Toileting - Clothing Manipulation Details (indicate cue type and reason): pt using urinal in sitting this session     Functional mobility during ADLs: Minimal assistance;Rolling walker General ADL Comments: pt mostly limited due to decreased activity tolerance, weakness     Vision Baseline Vision/History: Wears glasses       Perception     Praxis      Pertinent Vitals/Pain Pain Assessment: No/denies pain     Hand Dominance Right   Extremity/Trunk Assessment Upper Extremity Assessment Upper Extremity  Assessment: Generalized weakness   Lower Extremity Assessment Lower Extremity Assessment: Defer to PT evaluation   Cervical / Trunk Assessment Cervical / Trunk Assessment: Kyphotic(forward head)   Communication Communication Communication: No  difficulties   Cognition Arousal/Alertness: Awake/alert Behavior During Therapy: WFL for tasks assessed/performed Overall Cognitive Status: Within Functional Limits for tasks assessed                                     General Comments  BP start of session in sitting 115/86, in standing 109/57; pt on 4L O2 during session with SpO2 decreasing to 83% with standing activity, returns to >90% with seated rest and cues for deep breathing    Exercises     Shoulder Instructions      Home Living Family/patient expects to be discharged to:: Skilled nursing facility Living Arrangements: Alone                               Additional Comments: pt lives alone in 1 story home with 3-5 steps to enter, son and daughter check on him frequently, was driving until recently      Prior Functioning/Environment Level of Independence: Independent with assistive device(s)        Comments: reports he was using a walker recently due freq falling episodes.         OT Problem List: Decreased strength;Decreased range of motion;Decreased activity tolerance;Impaired balance (sitting and/or standing);Decreased safety awareness;Decreased knowledge of use of DME or AE      OT Treatment/Interventions: Self-care/ADL training;Therapeutic exercise;Energy conservation;DME and/or AE instruction;Therapeutic activities;Cognitive remediation/compensation;Patient/family education;Balance training    OT Goals(Current goals can be found in the care plan section) Acute Rehab OT Goals Patient Stated Goal: home OT Goal Formulation: With patient Time For Goal Achievement: 06/22/18 Potential to Achieve Goals: Good  OT Frequency: Min 2X/week   Barriers to D/C:            Co-evaluation              AM-PAC OT "6 Clicks" Daily Activity     Outcome Measure Help from another person eating meals?: None Help from another person taking care of personal grooming?: A Little Help from  another person toileting, which includes using toliet, bedpan, or urinal?: A Lot Help from another person bathing (including washing, rinsing, drying)?: A Lot Help from another person to put on and taking off regular upper body clothing?: A Little Help from another person to put on and taking off regular lower body clothing?: A Lot 6 Click Score: 16   End of Session Equipment Utilized During Treatment: Rolling walker;Oxygen Nurse Communication: Mobility status  Activity Tolerance: Patient tolerated treatment well Patient left: in chair;with call bell/phone within reach;with chair alarm set  OT Visit Diagnosis: Muscle weakness (generalized) (M62.81)                Time: 1448-1856 OT Time Calculation (min): 34 min Charges:  OT General Charges $OT Visit: 1 Visit OT Evaluation $OT Eval Moderate Complexity: 1 Mod OT Treatments $Self Care/Home Management : 8-22 mins  Lou Cal, OT Supplemental Rehabilitation Services Pager [phone] Office [place] 06/08/2018, 3:45 PM

## 2018-06-08 NOTE — Progress Notes (Signed)
ANTICOAGULATION CONSULT NOTE  Pharmacy Consult for IV heparin Indication: chest pain/ACS   Patient Measurements: Height: 5\' 9"  (175.3 cm) Weight: 151 lb 3.8 oz (68.6 kg) IBW/kg (Calculated) : 70.7 Heparin Dosing Weight: actual 69.2 kg  Vital Signs: Temp: 98.2 F (36.8 C) (05/05 0855) Temp Source: Oral (05/05 0855) BP: 105/55 (05/05 0855) Pulse Rate: 65 (05/05 0855)  Labs: Recent Labs    06/07/18 0125 06/07/18 0408  06/07/18 1241 06/07/18 1456 06/07/18 1830 06/08/18 0014 06/08/18 0931  HGB 11.5*  --   --   --   --   --  11.2*  --   HCT 35.3*  --   --   --   --   --  33.2*  --   PLT 144*  --   --   --   --   --  144*  --   APTT  --  32  --   --   --   --  53*  --   LABPROT  --  16.0*  --   --   --   --  15.8*  --   INR  --  1.3*  --   --   --   --  1.3*  --   HEPARINUNFRC  --   --   --   --  <0.10*  --  <0.10* <0.10*  CREATININE 0.69  --   --   --   --   --  0.74  --   TROPONINI 0.93*  --    < > 2.43*  --  3.08* 2.49*  --    < > = values in this interval not displayed.    Assessment: 3 yoM found down in his kitchen by a neighbor. Troponin = 0.93 > up to 2.4 PTA asa, lipitor, no anticoagulants.   Heparin level remains undetectable s/p rate increase, RN reports no issues with infusion.  Goal of Therapy:  Heparin level 0.3-0.7 units/ml Monitor platelets by anticoagulation protocol: Yes   Plan:  Increase heparin to 1450 units/hr Daily HL, CBC F/u 8 hour heparin level Monitor duration of heparin   Hughes Better M'06/08/2018 11:37 AM

## 2018-06-08 NOTE — Progress Notes (Signed)
Progress Note  Patient Name: Bradley Meadows Date of Encounter: 06/08/2018  Primary Cardiologist: Sanda Klein, MD   Subjective   78 yo with hx of Sq. Cell lung cancer, COPD, ongoing cigarette smoking, CAD admitted yesterday after being found down . O2 sats were 60's troponins are elevated Peak troponin is 3.26,  Has decreased to 2.49 Echo reveals moderately reduced LV function with EF 35-40%.   Had a long discussion with his daughter, [name] .     Inpatient Medications    Scheduled Meds:  aspirin EC  81 mg Oral Daily   atorvastatin  40 mg Oral Daily   furosemide  40 mg Intravenous BID   furosemide  80 mg Intravenous Once   pantoprazole  40 mg Oral Daily   sucralfate  1 g Oral BID AC   Continuous Infusions:  heparin 1,250 Units/hr (06/08/18 0205)   PRN Meds: acetaminophen **OR** acetaminophen, bisacodyl, HYDROcodone-acetaminophen, ipratropium-albuterol, senna-docusate   Vital Signs    Vitals:   06/07/18 1514 06/07/18 1716 06/07/18 2101 06/08/18 0428  BP: 99/69 108/83 (!) 103/51 109/74  Pulse: 77 86 97 74  Resp: 17 (!) 22 19 (!) 26  Temp: 98.2 F (36.8 C)  98.2 F (36.8 C) 98.9 F (37.2 C)  TempSrc: Oral  Oral Oral  SpO2: 91% 100% 100% 98%  Weight:    68.6 kg  Height:        Intake/Output Summary (Last 24 hours) at 06/08/2018 0821 Last data filed at 06/08/2018 0436 Gross per 24 hour  Intake 906.35 ml  Output 2275 ml  Net -1368.65 ml   Last 3 Weights 06/08/2018 06/07/2018 06/07/2018  Weight (lbs) 151 lb 3.8 oz 152 lb 8.9 oz 153 lb  Weight (kg) 68.6 kg 69.2 kg 69.4 kg      Telemetry    NSR ,  Artifact,  PACs  - Personally Reviewed  ECG    Sinus with ectopy - Personally Reviewed  Physical Exam    GEN: chronically ill man,   Very frail.    Neck: No JVD Cardiac:  RR,  Tachy  Respiratory: Clear to auscultation bilaterally. GI:  thin  MS: trace edema  Neuro:  Nonfocal  Psych: Normal affect   Labs    Chemistry Recent Labs  Lab 06/07/18 0125  06/08/18 0014  NA 131* 132*  K 3.9 3.3*  CL 92* 88*  CO2 30 32  GLUCOSE 109* 93  BUN 19 18  CREATININE 0.69 0.74  CALCIUM 8.4* 8.5*  PROT 5.8* 4.7*  ALBUMIN 3.5 2.8*  AST 53* 94*  ALT 24 32  ALKPHOS 63 56  BILITOT 1.5* 0.7  GFRNONAA >60 >60  GFRAA >60 >60  ANIONGAP 9 12     Hematology Recent Labs  Lab 06/07/18 0125 06/08/18 0014  WBC 10.9* 7.9  RBC 3.60* 3.59*  HGB 11.5* 11.2*  HCT 35.3* 33.2*  MCV 98.1 92.5  MCH 31.9 31.2  MCHC 32.6 33.7  RDW 13.6 13.5  PLT 144* 144*    Cardiac Enzymes Recent Labs  Lab 06/07/18 0804 06/07/18 1241 06/07/18 1830 06/08/18 0014  TROPONINI 3.26* 2.43* 3.08* 2.49*   No results for input(s): TROPIPOC in the last 168 hours.   BNP Recent Labs  Lab 06/07/18 0125  BNP 656.6*     DDimer No results for input(s): DDIMER in the last 168 hours.   Radiology    Ct Head Wo Contrast  Result Date: 06/07/2018 CLINICAL DATA:  78 y/o M; fall with head and neck  trauma. History of lung cancer. EXAM: CT HEAD WITHOUT CONTRAST CT CERVICAL SPINE WITHOUT CONTRAST TECHNIQUE: Multidetector CT imaging of the head and cervical spine was performed following the standard protocol without intravenous contrast. Multiplanar CT image reconstructions of the cervical spine were also generated. COMPARISON:  12/11/2017 PET-CT. FINDINGS: CT HEAD FINDINGS Brain: No evidence of acute infarction, hemorrhage, hydrocephalus, extra-axial collection or mass lesion/mass effect. Few nonspecific white matter hypodensities are compatible with mild chronic microvascular ischemic changes and there is mild volume loss of the brain. Vascular: Calcific atherosclerosis of carotid siphons. Skull: Normal. Negative for fracture or focal lesion. Sinuses/Orbits: No acute finding. Other: Bilateral intra-ocular lens replacement. CT CERVICAL SPINE FINDINGS Alignment: Mild reversal of cervical curvature with apex at C6. C4-5 and C5-6 grade 1 anterolisthesis. Skull base and vertebrae: No acute  fracture. No primary bone lesion or focal pathologic process. Soft tissues and spinal canal: No prevertebral fluid or swelling. No visible canal hematoma. Disc levels: Productive changes of the articulation of anterior arch of C1, odontoid, and basion. Right-greater-than-left facet hypertrophy and multilevel discogenic degenerative changes greatest at C5-C7. Uncovertebral and facet hypertrophy contribute to bony neural foraminal stenosis at the bilateral C2-3, right C3-4, right greater than left C4-5, right greater than left C5-6, bilateral C6-7 levels. No high-grade bony spinal canal stenosis. Upper chest: Centrilobular emphysema. Other: Calcific atherosclerosis of carotid bifurcations. IMPRESSION: 1. No acute intracranial abnormality or calvarial fracture. 2. No acute fracture or dislocation of the cervical spine. 3. Mild chronic microvascular ischemic changes and volume loss of the brain. 4. 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 5.  Emphysema (ICD10-J43.9). Electronically Signed   By: Kristine Garbe M.D.   On: 06/07/2018 01:17   Ct Cervical Spine Wo Contrast  Result Date: 06/07/2018 CLINICAL DATA:  78 y/o M; fall with head and neck trauma. History of lung cancer. EXAM: CT HEAD WITHOUT CONTRAST CT CERVICAL SPINE WITHOUT CONTRAST TECHNIQUE: Multidetector CT imaging of the head and cervical spine was performed following the standard protocol without intravenous contrast. Multiplanar CT image reconstructions of the cervical spine were also generated. COMPARISON:  12/11/2017 PET-CT. FINDINGS: CT HEAD FINDINGS Brain: No evidence of acute infarction, hemorrhage, hydrocephalus, extra-axial collection or mass lesion/mass effect. Few nonspecific white matter hypodensities are compatible with mild chronic microvascular ischemic changes and there is mild volume loss of the brain. Vascular: Calcific atherosclerosis of carotid siphons. Skull: Normal. Negative for fracture or focal lesion.  Sinuses/Orbits: No acute finding. Other: Bilateral intra-ocular lens replacement. CT CERVICAL SPINE FINDINGS Alignment: Mild reversal of cervical curvature with apex at C6. C4-5 and C5-6 grade 1 anterolisthesis. Skull base and vertebrae: No acute fracture. No primary bone lesion or focal pathologic process. Soft tissues and spinal canal: No prevertebral fluid or swelling. No visible canal hematoma. Disc levels: Productive changes of the articulation of anterior arch of C1, odontoid, and basion. Right-greater-than-left facet hypertrophy and multilevel discogenic degenerative changes greatest at C5-C7. Uncovertebral and facet hypertrophy contribute to bony neural foraminal stenosis at the bilateral C2-3, right C3-4, right greater than left C4-5, right greater than left C5-6, bilateral C6-7 levels. No high-grade bony spinal canal stenosis. Upper chest: Centrilobular emphysema. Other: Calcific atherosclerosis of carotid bifurcations. IMPRESSION: 1. No acute intracranial abnormality or calvarial fracture. 2. No acute fracture or dislocation of the cervical spine. 3. Mild chronic microvascular ischemic changes and volume loss of the brain. 4. 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 5.  Emphysema (ICD10-J43.9). Electronically Signed   By: Kristine Garbe M.D.   On:  06/07/2018 01:17   Dg Chest Port 1 View  Result Date: 06/07/2018 CLINICAL DATA:  Weakness, syncope EXAM: PORTABLE CHEST 1 VIEW COMPARISON:  PET CT 12/11/2017 FINDINGS: Right Port-A-Cath in place with the tip in the right atrium. Cardiomegaly with vascular congestion. Interstitial prominence likely reflects interstitial edema. Bibasilar atelectasis or infiltrates. No effusions. No acute bony abnormality. IMPRESSION: Cardiomegaly with vascular congestion and probable interstitial edema. Bibasilar atelectasis or infiltrates. Cannot exclude pneumonia particularly in the right lung base. Electronically Signed   By: Rolm Baptise M.D.   On:  06/07/2018 03:52    Cardiac Studies   Echo 06/07/18:  1. The left ventricle has moderately reduced systolic function, with an ejection fraction of 35-40%. The cavity size was moderately dilated. Left ventricular diastolic Doppler parameters are consistent with impaired relaxation.  2. Moderate hypokinesis of the left ventricular, basal-mid inferior wall and inferolateral wall.  3. The right ventricle has moderately reduced systolic function. The cavity was moderately enlarged. Right ventricular systolic pressure is mildly elevated with an estimated pressure of 39.6 mmHg.  4. Left atrial size was moderately dilated.  5. Right atrial size was moderately dilated.  6. There is mild mitral annular calcification present.  7. The aortic root is normal in size and structure.  8. Compared to the June 2019 study, the left ventricle is more dilated and has a slight worsening of systolic function, but the overall patern of regional wall motion is similar.  FINDINGS  Left Ventricle: The left ventricle has moderately reduced systolic function, with an ejection fraction of 35-40%. The cavity size was moderately dilated. There is no increase in left ventricular wall thickness. Left ventricular diastolic Doppler  parameters are consistent with impaired relaxation (grade I). Normal left ventricular filling pressures. Moderate hypokinesis of the left ventricular, basal-mid inferior wall and inferolateral wall.    Myoview 02/19/2016 Study Highlights    The left ventricular ejection fraction is normal (55-65%).  Nuclear stress EF: 53%. Visually,  Defect 1: There is a medium defect of moderate severity present in the basal inferolateral and apical lateral location.  The study is normal.  This is a low risk study.    Echo 07/24/2017 LV EF: 40% - 45% Study Conclusions  - Left ventricle: The cavity size was mildly dilated. Systolic function was mildly to moderately reduced. The estimated  ejection fraction was in the range of 40% to 45%. Hypokinesis in the basal to mid inferior, inferolateral, and anterolateral myocardium. Doppler parameters are consistent with abnormal left ventricular relaxation (grade 1 diastolic dysfunction). - Aortic valve: Transvalvular velocity was within the normal range. There was no stenosis. There was no regurgitation. - Mitral valve: Moderately calcified annulus. Transvalvular velocity was within the normal range. There was no evidence for stenosis. There was mild regurgitation. - Left atrium: The atrium was severely dilated. - Right ventricle: The cavity size was normal. Wall thickness was normal. Systolic function was normal. - Right atrium: The atrium was moderately dilated. - Tricuspid valve: There was trivial regurgitation. - Pulmonary arteries: Systolic pressure was mildly increased. PA peak pressure: 42 mm Hg (S).   Patient Profile     78 y.o. male with a hx of squamous cell lunge cancer s/p chemo/xrt, COPD on home oxygen, tobacco abuse, GERD, hypertension, hyperlipidemia, PVCs and CAD who is being seen today for the evaluation of elevated troponin   Assessment & Plan    1. Elevated troponin 2. Known CAD - troponin: 0.93 --> 3.26 --> 2.43 --> 3.08 --> 2.49 -  Cardiac catheterization April 2018 showed occluded left circumflex artery with collaterals, medical therapy was recommended. Myoview obtained on 02/19/2016 showed EF 53, medium defect of moderate severity present in the basal inferolateral and apical lateral location, overall considered low risk study. - as discussed, thought to be demand iscemia in the setting of profound hypoxia/being found down at home - troponin trending down - continue heparin drip for 48 hr total - echo yesterday with EF of 35-40%  - likely medical management as he is not a good candidate for invasive angiography at this time  - continue ASA and lipitor   3. Acute on chronic systolic  and diastolic heart failure - echo pending - prior echo with  EF of 40-45% (2019) - echo yesterday with EF of 35-40% - BNP elevated - diuresing on 40 mg IV lasix BID - overall net negative 1.8 L with 2.7 L urine output yesterday - weight is 151 lbs, down from 153 on admission - dry weight difficult to ascertain - was 509 lbs in clinic 09/2017   4. HTN - pressure marginal - no medication changes   5. HLD - continue statin - 06/07/2018: Cholesterol 124; HDL 47; LDL Cholesterol 71; Triglycerides 32; VLDL 6   6. Acute on chronic respiratory failure 7. COPD 8. Ongoing tobacco use - likely contributing to his overall picture - encouraged cessation       For questions or updates, please contact [place] Please consult [email or web address] for contact info under        Signed, Ledora Bottcher, PA  06/08/2018, 8:21 AM     Attending Note:   The patient was seen and examined.  Agree with assessment and plan as noted above.  Changes made to the above note as needed.  Patient seen and independently examined with Doreene Adas, PA .   We discussed all aspects of the encounter. I agree with the assessment and plan as stated above.  1.   NSTEMI:   Long discussion with his daughter,[name] He is very frail and is a very poor candidate for any cardiac intervention Our best option is for conservative medical therapy .   2.  Lung cancer :  Has lost about 20 lbs over the past year. This is not a good sign.   Likely has recurrent lung cancer  Still smokes.   3.  Acute on chronic systolic CHF:   Continue conservative therapy .  4.  Disposition :  He is very frail.   I think we will need to consider SNF.     I have spent a total of 40 minutes with patient reviewing hospital  notes , telemetry, EKGs, labs and examining patient as well as establishing an assessment and plan that was discussed with the patient. > 50% of time was spent in direct patient care.    Thayer Headings, Brooke Bonito., MD,  [facility] 06/08/2018, 11:03 AM [address],  [place] Pager [phone]

## 2018-06-09 DIAGNOSIS — I5022 Chronic systolic (congestive) heart failure: Secondary | ICD-10-CM

## 2018-06-09 LAB — HEPARIN LEVEL (UNFRACTIONATED)
Heparin Unfractionated: 0.22 IU/mL — ABNORMAL LOW (ref 0.30–0.70)
Heparin Unfractionated: 0.45 IU/mL (ref 0.30–0.70)

## 2018-06-09 LAB — CBC
HCT: 32.5 % — ABNORMAL LOW (ref 39.0–52.0)
Hemoglobin: 11 g/dL — ABNORMAL LOW (ref 13.0–17.0)
MCH: 31.3 pg (ref 26.0–34.0)
MCHC: 33.8 g/dL (ref 30.0–36.0)
MCV: 92.6 fL (ref 80.0–100.0)
Platelets: 143 10*3/uL — ABNORMAL LOW (ref 150–400)
RBC: 3.51 MIL/uL — ABNORMAL LOW (ref 4.22–5.81)
RDW: 13.6 % (ref 11.5–15.5)
WBC: 8.6 10*3/uL (ref 4.0–10.5)
nRBC: 0 % (ref 0.0–0.2)

## 2018-06-09 NOTE — NC FL2 (Signed)
[place] LEVEL OF CARE SCREENING TOOL     IDENTIFICATION  Patient Name: Bradley Meadows Birthdate: 1940-05-06 Sex: male Admission Date (Current Location): 06/07/2018  [facility] and [place] Number:  Herbalist and Address:  [facility]. [facility], [place] [address] [place], [place] [address]      Provider Number: [id number]  Attending Physician Name and Address:  Donne Hazel, MD  Relative Name and Phone Number:  [name] (daughter)- [phone]    Current Level of Care: Hospital Recommended Level of Care: [place] Prior Approval Number:    Date Approved/Denied:   PASRR Number: [id number]  Discharge Plan: SNF    Current Diagnoses: Patient Active Problem List   Diagnosis Date Noted  . NSTEMI (non-ST elevated myocardial infarction) ([place]) 06/07/2018  . IDA (iron deficiency anemia) 06/15/2017  . Pressure injury of skin 02/27/2017  . Malnutrition of moderate degree 02/27/2017  . Port-A-Cath in place 12/26/2016  . Squamous cell lung cancer, right ([place]) 04/07/2016  . Lung cancer, primary, 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, right ([place]) 04/07/2016  . Goals of care, counseling/discussion 04/07/2016  . Esophageal mass   . Abnormal CT of the chest   . Gastroparesis   . History of peptic ulcer 04/13/2014  . Depression 06/22/2013  . Incarcerated right inguinal hernia 01/14/2013  . Anemia 06/03/2012  . Chronic hyponatremia 06/03/2012  . Dizziness 05/26/2012  . Tobacco abuse 08/29/2010  . Mixed hyperlipidemia 03/26/2007  . Chronic systolic heart failure ([place]) 12/03/2006  . CAD (coronary artery disease), native coronary artery 09/04/2006  . COPD mixed type ([place]) 04/21/2006  . ERECTILE DYSFUNCTION 04/02/2006  . HYPERTENSION, BENIGN SYSTEMIC 04/02/2006  . OSTEOARTHRITIS, MULTI SITES 04/02/2006    Orientation RESPIRATION BLADDER Height & Weight     Self, Time, Situation, Place  O2(3L) Continent Weight: 65.7  kg Height:  5\' 9"  (175.3 cm)  BEHAVIORAL SYMPTOMS/MOOD NEUROLOGICAL BOWEL NUTRITION STATUS      Continent Diet  AMBULATORY STATUS COMMUNICATION OF NEEDS Skin   Limited Assist Verbally Normal                       Personal Care Assistance Level of Assistance  Bathing, Dressing Bathing Assistance: Limited assistance   Dressing Assistance: Limited assistance     Functional Limitations Info             SPECIAL CARE FACTORS FREQUENCY  PT (By licensed PT), OT (By licensed OT)     PT Frequency: 5x week OT Frequency: 5x week            Contractures Contractures Info: Not present    Additional Factors Info                  Current Medications (06/09/2018):  This is the current hospital active medication list Current Facility-Administered Medications  Medication Dose Route Frequency Provider Last Rate Last Dose  . acetaminophen (TYLENOL) tablet 650 mg  650 mg Oral Q6H PRN Amin, Ankit Chirag, MD       Or  . acetaminophen (TYLENOL) suppository 650 mg  650 mg Rectal Q6H PRN Amin, Ankit Chirag, MD      . aspirin EC tablet 81 mg  81 mg Oral Daily Amin, Ankit Chirag, MD   81 mg at 06/09/18 1101  . atorvastatin (LIPITOR) tablet 40 mg  40 mg Oral Daily Amin, Ankit Chirag, MD   40 mg at 06/09/18 1101  . bisacodyl (DULCOLAX) EC  tablet 5 mg  5 mg Oral Daily PRN Amin, Ankit Chirag, MD      . furosemide (LASIX) injection 40 mg  40 mg Intravenous BID Dorothy Spark, MD   40 mg at 06/09/18 1102  . furosemide (LASIX) injection 80 mg  80 mg Intravenous Once Amin, Ankit Chirag, MD      . heparin ADULT infusion 100 units/mL (25000 units/287mL sodium chloride 0.45%)  1,850 Units/hr Intravenous Continuous Harvel Quale, RPH 18.5 mL/hr at 06/09/18 1102 1,850 Units/hr at 06/09/18 1102  . HYDROcodone-acetaminophen (NORCO/VICODIN) 5-325 MG per tablet 1-2 tablet  1-2 tablet Oral Q4H PRN Damita Lack, MD   1 tablet at 06/08/18 2053  . ipratropium-albuterol (DUONEB) 0.5-2.5 (3)  MG/3ML nebulizer solution 3 mL  3 mL Inhalation Q6H PRN Amin, Ankit Chirag, MD      . metoprolol tartrate (LOPRESSOR) tablet 12.5 mg  12.5 mg Oral BID Emokpae, Courage, MD   12.5 mg at 06/09/18 1101  . pantoprazole (PROTONIX) EC tablet 40 mg  40 mg Oral Daily Amin, Ankit Chirag, MD   40 mg at 06/09/18 1101  . senna-docusate (Senokot-S) tablet 1 tablet  1 tablet Oral QHS PRN Amin, Ankit Chirag, MD      . sucralfate (CARAFATE) 1 GM/10ML suspension 1 g  1 g Oral BID AC Amin, Ankit Chirag, MD   1 g at 06/08/18 1658     Discharge Medications: Please see discharge summary for a list of discharge medications.  Relevant Imaging Results:  Relevant Lab Results:   Additional Information SS#- [id number]  [name], RN

## 2018-06-09 NOTE — Progress Notes (Signed)
Physical Therapy Treatment Patient Details Name: Bradley Meadows MRN: [id number] DOB: [birth year] Today's Date: 06/09/2018    History of Present Illness Bradley Meadows is a 78 y.o. male with medical history significant of CAD, right lung  NCSL stage III, systolic CHF 71%, iron deficiency anemia, GERD, hypertension, active tobacco use, COPD on 4 L nasal cannula, hyperlipidemia was brought to the hospital for evaluation of syncope and hypoxia.    PT Comments    Due to need for peri care and hygiene, emphasis on standing activity and gait training/stamina with RW.  Unable to get an accurate reading for SpO2 due to poor sensor.  Pt responded well to activity and showed only minimal SOB.    Follow Up Recommendations  SNF;Supervision/Assistance - 24 hour     Equipment Recommendations  Other (comment)(TBD next venue)    Recommendations for Other Services       Precautions / Restrictions Precautions Precautions: Fall Precaution Comments: watch O2, dizziness    Mobility  Bed Mobility Overal bed mobility: Needs Assistance Bed Mobility: Supine to Sit     Supine to sit: Min guard     General bed mobility comments: mildly elevated bed, up via L elbow and use of the rail  Transfers Overall transfer level: Needs assistance Equipment used: Rolling walker (2 wheeled) Transfers: Sit to/from Stand Sit to Stand: Min assist         General transfer comment: pt unable to come forward enough on any lower height surface without assist.  Frequent cuing for hand placement  Ambulation/Gait Ambulation/Gait assistance: Min assist Gait Distance (Feet): 140 Feet Assistive device: Rolling walker (2 wheeled) Gait Pattern/deviations: Step-through pattern;Decreased stride length Gait velocity: dec   General Gait Details: Multiple attempts to get a sat reading without success on 4L [place].  Pt with mild SOB   Stairs             Wheelchair Mobility    Modified Rankin (Stroke Patients Only)        Balance Overall balance assessment: Needs assistance   Sitting balance-Leahy Scale: Fair(to good) Sitting balance - Comments: pt reached down to straighten his socks     Standing balance-Leahy Scale: Poor Standing balance comment: dependent on RW at this time                            Cognition Arousal/Alertness: Awake/alert Behavior During Therapy: WFL for tasks assessed/performed Overall Cognitive Status: Within Functional Limits for tasks assessed                                        Exercises      General Comments General comments (skin integrity, edema, etc.): Lots of time spent standing for pericare and getting pt a clean place to sit.  Pt left in a chair at the sink.      Pertinent Vitals/Pain Pain Assessment: No/denies pain    Home Living                      Prior Function            PT Goals (current goals can now be found in the care plan section) Acute Rehab PT Goals Patient Stated Goal: home PT Goal Formulation: With patient Time For Goal Achievement: 06/22/18 Potential to Achieve Goals: Good Progress towards PT goals: Progressing  toward goals    Frequency    Min 3X/week      PT Plan Current plan remains appropriate    Co-evaluation              AM-PAC PT "6 Clicks" Mobility   Outcome Measure  Help needed turning from your back to your side while in a flat bed without using bedrails?: A Little Help needed moving from lying on your back to sitting on the side of a flat bed without using bedrails?: A Little Help needed moving to and from a bed to a chair (including a wheelchair)?: A Little Help needed standing up from a chair using your arms (e.g., wheelchair or bedside chair)?: A Little Help needed to walk in hospital room?: A Little Help needed climbing 3-5 steps with a railing? : A Lot 6 Click Score: 17    End of Session Equipment Utilized During Treatment: Oxygen Activity Tolerance:  Patient limited by fatigue Patient left: in chair;with call bell/phone within reach;with chair alarm set;with nursing/sitter in room Nurse Communication: Mobility status PT Visit Diagnosis: Unsteadiness on feet (R26.81);Muscle weakness (generalized) (M62.81);History of falling (Z91.81)     Time: 2336-1224 PT Time Calculation (min) (ACUTE ONLY): 41 min  Charges:  $Gait Training: 8-22 mins $Therapeutic Activity: 8-22 mins $Self Care/Home Management: 8-22                     06/09/2018  Bradley Meadows, PT Acute Rehabilitation Services [phone]  (pager) [phone]  (office)   Bradley Meadows 06/09/2018, 4:42 PM

## 2018-06-09 NOTE — Progress Notes (Signed)
PROGRESS NOTE    ADHRIT KRENZ  VPX:[id number] DOB: 07-06-1940 DOA: 06/07/2018 PCP: Susy Frizzle, MD    Brief Narrative:  78 yo with hx of Sq. Cell lung cancer, COPD, ongoing cigarette smoking, chronic hypoxic respiratory failure on home O2, HTN, CAD admitted 06/07/18 after being found down , O2 sats were 60's, troponins are elevated  Echo reveals moderately reduced LV function with EF 35-40%, cardiology recommends medical management, PT recommends SNF rehab.  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 medical management advised, continued on aspirin, metoprolol, Lipitor -Currently continued on IV heparin -Cardiology had been following. Since signed off, recommending medical management  2)Acute on chronic HFrEF - Repeat Echo from 06/07/2018 with EF of 35 to 46% diastolic dysfunction - patient noted to have combined diastolic and systolic dysfunction CHF exacerbation, continued on IV Lasix 40 mg twice daily -Repeat bmet in AM  3) COPD -stable, looking cessation advised, continue bronchodilators, no acute exacerbation at this time -No wheezing on auscultation  4)Rt Lung SCC Lung Cancer-patient with recent weight loss, follow-up with oncologist post discharge advised....  -Patient noted to be planned for outpatient PET scan as outpatient for  reevaluation of possible lung cancer recurrence soon  5)Chronic hypoxic respiratory failure--- continue to #1 #2 #3 and # 4 above...., at Baseline patient is on 4 L of oxygen via nasal cannula -Stable at present  6)Generalized weakness/Falls -PT recommended SNF -SW following  DVT prophylaxis: Heparin gtt Code Status: Full Family Communication: pt in room, family not at bedside Disposition Plan: SNF pending  Consultants:   Cardiology  Procedures:     Antimicrobials: Anti-infectives (From admission, onward)   None       Subjective: Eager to be discharged soon. Denies chest pain  Objective: Vitals:   06/08/18 1436 06/08/18 2045 06/09/18 0308 06/09/18 1100  BP: (!) 99/50 113/65 110/60 (!) 124/91  Pulse: 90 82 94 79  Resp: (!) 26 19 16    Temp: 97.7 F (36.5 C) 98 F (36.7 C) 98.4 F (36.9 C) 98.6 F (37 C)  TempSrc: Oral Oral Oral Oral  SpO2: 100% 97% 97% 97%  Weight:   65.7 kg   Height:        Intake/Output Summary (Last 24 hours) at 06/09/2018 1417 Last data filed at 06/09/2018 0926 Gross per 24 hour  Intake 925 ml  Output 1700 ml  Net -775 ml   Filed Weights   06/07/18 1400 06/08/18 0428 06/09/18 0308  Weight: 69.2 kg 68.6 kg 65.7 kg    Examination:  General exam: Appears calm and comfortable  Respiratory system: Clear to auscultation. Respiratory effort normal. Cardiovascular system: S1 & S2 heard, RRR Gastrointestinal system: Abdomen is nondistended, soft and nontender. No organomegaly or masses felt. Normal bowel sounds heard. Central nervous system: Alert and oriented. No focal neurological deficits. Extremities: Symmetric 5 x 5 power. Skin: No  rashes, lesions Psychiatry: Judgement and insight appear normal. Mood & affect appropriate.   Data Reviewed: I have personally reviewed following labs and imaging studies  CBC: Recent Labs  Lab 06/07/18 0125 06/08/18 0014 06/09/18 0306  WBC 10.9* 7.9 8.6  NEUTROABS 9.9*  --   --   HGB 11.5*  11.2* 11.0*  HCT 35.3* 33.2* 32.5*  MCV 98.1 92.5 92.6  PLT 144* 144* 660*   Basic Metabolic Panel: Recent Labs  Lab 06/07/18 0125 06/08/18 0014  NA 131* 132*  K 3.9 3.3*  CL 92* 88*  CO2 30 32  GLUCOSE 109* 93  BUN 19 18  CREATININE 0.69 0.74  CALCIUM 8.4* 8.5*  MG 1.7  --    GFR: Estimated Creatinine Clearance: 70.7 mL/min (by C-G formula based on SCr of 0.74 mg/dL). Liver Function Tests: Recent Labs  Lab 06/07/18 0125 06/08/18 0014  AST 53* 94*  ALT 24 32  ALKPHOS 63 56  BILITOT 1.5* 0.7  PROT 5.8* 4.7*  ALBUMIN 3.5 2.8*   No results for input(s): LIPASE, AMYLASE in the last 168 hours. No results for input(s): AMMONIA in the last 168 hours. Coagulation Profile: Recent Labs  Lab 06/07/18 0408 06/08/18 0014  INR 1.3* 1.3*   Cardiac Enzymes: Recent Labs  Lab 06/07/18 0125 06/07/18 0804 06/07/18 1241 06/07/18 1830 06/08/18 0014  TROPONINI 0.93* 3.26* 2.43* 3.08* 2.49*   BNP (last 3 results) No results for input(s): PROBNP in the last 8760 hours. HbA1C: Recent Labs    06/07/18 0125  HGBA1C 5.5   CBG: No results for input(s): GLUCAP in the last 168 hours. Lipid Profile: Recent Labs    06/07/18 0804  CHOL 124  HDL 47  LDLCALC 71  TRIG 32  CHOLHDL 2.6   Thyroid Function Tests: Recent Labs    06/07/18 0804  TSH 1.222   Anemia Panel: No results for input(s): VITAMINB12, FOLATE, FERRITIN, TIBC, IRON, RETICCTPCT in the last 72 hours. Sepsis Labs: No results for input(s): PROCALCITON, LATICACIDVEN in the last 168 hours.  Recent Results (from the past 240 hour(s))  SARS Coronavirus 2 (CEPHEID - Performed in [place] hospital lab), Hosp Order     Status: None   Collection Time: 06/07/18  3:21 AM  Result Value Ref Range Status   SARS Coronavirus 2 NEGATIVE NEGATIVE Final    Comment: (NOTE) If result is NEGATIVE SARS-CoV-2 target nucleic acids are NOT DETECTED. The SARS-CoV-2 RNA is generally detectable in upper and lower  respiratory  specimens during the acute phase of infection. The lowest  concentration of SARS-CoV-2 viral copies this assay can detect is 250  copies / mL. A negative result does not preclude SARS-CoV-2 infection  and should not be used as the sole basis for treatment or other  patient management decisions.  A negative result may occur with  improper specimen collection / handling, submission of specimen other  than nasopharyngeal swab, presence of viral mutation(s) within the  areas targeted by this assay, and inadequate number of viral copies  (<250 copies / mL). A negative result must be combined with clinical  observations, patient history, and epidemiological information. If result is POSITIVE SARS-CoV-2 target nucleic acids are DETECTED. The SARS-CoV-2 RNA is generally detectable in upper and lower  respiratory specimens dur ing the acute phase of infection.  Positive  results are indicative of active infection with SARS-CoV-2.  Clinical  correlation with patient history and other diagnostic information is  necessary to determine patient infection status.  Positive results  do  not rule out bacterial infection or co-infection with other viruses. If result is PRESUMPTIVE POSTIVE SARS-CoV-2 nucleic acids MAY BE PRESENT.   A presumptive positive result was obtained on the submitted specimen  and confirmed on repeat testing.  While 2019 novel coronavirus  (SARS-CoV-2) nucleic acids may be present in the submitted sample  additional confirmatory testing may be necessary for epidemiological  and / or clinical management purposes  to differentiate between  SARS-CoV-2 and other Sarbecovirus currently known to infect humans.  If clinically indicated additional testing with an alternate test  methodology [phone]) is advised. The SARS-CoV-2 RNA is generally  detectable in upper and lower respiratory sp ecimens during the acute  phase of infection. The expected result is Negative. Fact Sheet for  Patients:  StrictlyIdeas.no Fact Sheet for Healthcare Providers: BankingDealers.co.za This test is not yet approved or cleared by the [place] FDA and has been authorized for detection and/or diagnosis of SARS-CoV-2 by FDA under an Emergency Use Authorization (EUA).  This EUA will remain in effect (meaning this test can be used) for the duration of the COVID-19 declaration under Section 564(b)(1) of the Act, 21 U.S.C. section 360bbb-3(b)(1), unless the authorization is terminated or revoked sooner. Performed at [facility], [place] [address], [place], [place] [address]      Radiology Studies: No results found.  Scheduled Meds: . aspirin EC  81 mg Oral Daily  . atorvastatin  40 mg Oral Daily  . furosemide  40 mg Intravenous BID  . furosemide  80 mg Intravenous Once  . metoprolol tartrate  12.5 mg Oral BID  . pantoprazole  40 mg Oral Daily  . sucralfate  1 g Oral BID AC   Continuous Infusions: . heparin 1,850 Units/hr (06/09/18 1102)     LOS: 2 days   Marylu Lund, MD [facility] Pager On Amion  If 7PM-7AM, please contact night-coverage 06/09/2018, 2:17 PM

## 2018-06-09 NOTE — Progress Notes (Signed)
ANTICOAGULATION CONSULT NOTE  Pharmacy Consult for IV heparin Indication: chest pain/ACS  Patient Measurements: Height: 5\' 9"  (175.3 cm) Weight: 144 lb 13.5 oz (65.7 kg) IBW/kg (Calculated) : 70.7 Heparin Dosing Weight: actual 69.2 kg  Vital Signs: Temp: 98.6 F (37 C) (05/06 1100) Temp Source: Oral (05/06 1100) BP: 124/91 (05/06 1100) Pulse Rate: 79 (05/06 1100)  Labs: Recent Labs    06/07/18 0125 06/07/18 0408  06/07/18 1241  06/07/18 1830 06/08/18 0014  06/08/18 2004 06/09/18 0306 06/09/18 0642 06/09/18 1700  HGB 11.5*  --   --   --   --   --  11.2*  --   --  11.0*  --   --   HCT 35.3*  --   --   --   --   --  33.2*  --   --  32.5*  --   --   PLT 144*  --   --   --   --   --  144*  --   --  143*  --   --   APTT  --  32  --   --   --   --  53*  --   --   --   --   --   LABPROT  --  16.0*  --   --   --   --  15.8*  --   --   --   --   --   INR  --  1.3*  --   --   --   --  1.3*  --   --   --   --   --   HEPARINUNFRC  --   --   --   --    < >  --  <0.10*   < > 0.18*  --  0.22* 0.45  CREATININE 0.69  --   --   --   --   --  0.74  --   --   --   --   --   TROPONINI 0.93*  --    < > 2.43*  --  3.08* 2.49*  --   --   --   --   --    < > = values in this interval not displayed.    Assessment: 33 yoM found down in his kitchen by a neighbor.  Pharmacy consulted to dose IV heparin for ACS.  hgb stable at 11; hct stable at 32.5, platelets stable as well.  Heparin level therapeutic this evening at 1700 with a level of 0.45. no signs or symptoms of bleeding noted.   Goal of Therapy:  Heparin level 0.3-0.7 units/ml Monitor platelets by anticoagulation protocol: Yes   Plan:  Continue heparin gtt at 1850 units /hr Follow-up CBC/heparin level in AM.    Thank you for involving pharmacy in this patient's care.  Tamela Gammon, PharmD 06/09/2018 5:37 PM PGY-1 Pharmacy Resident Direct Phone: [phone] Please check [email or web address] for unit-specific pharmacist phone numbers

## 2018-06-09 NOTE — Progress Notes (Signed)
      No new developments from a cardiac standpoint. I talked to his daughter, [name].  At this point the patient does not need any additional cardiac work-up.  His prognosis is very poor.  He is very weak and will need to go to skilled nursing facility.  We will sign off.  We will be available again if needed.     Mertie Moores, MD  06/09/2018 12:18 PM    [facility] [place],  [place], [place]  [address] Pager [phone] Phone: [phone]; Fax: [phone]

## 2018-06-10 DIAGNOSIS — D500 Iron deficiency anemia secondary to blood loss (chronic): Secondary | ICD-10-CM

## 2018-06-10 DIAGNOSIS — K3184 Gastroparesis: Secondary | ICD-10-CM

## 2018-06-10 LAB — BASIC METABOLIC PANEL
Anion gap: 11 (ref 5–15)
BUN: 14 mg/dL (ref 8–23)
CO2: 41 mmol/L — ABNORMAL HIGH (ref 22–32)
Calcium: 8.3 mg/dL — ABNORMAL LOW (ref 8.9–10.3)
Chloride: 81 mmol/L — ABNORMAL LOW (ref 98–111)
Creatinine, Ser: 0.7 mg/dL (ref 0.61–1.24)
GFR calc Af Amer: >60 mL/min (ref >60)
GFR calc non Af Amer: >60 mL/min (ref >60)
Glucose, Bld: 113 mg/dL — ABNORMAL HIGH (ref 70–99)
Potassium: 3.3 mmol/L — ABNORMAL LOW (ref 3.5–5.1)
Sodium: 133 mmol/L — ABNORMAL LOW (ref 135–145)

## 2018-06-10 LAB — CBC
HCT: 33.8 % — ABNORMAL LOW (ref 39.0–52.0)
Hemoglobin: 11.2 g/dL — ABNORMAL LOW (ref 13.0–17.0)
MCH: 31.2 pg (ref 26.0–34.0)
MCHC: 33.1 g/dL (ref 30.0–36.0)
MCV: 94.2 fL (ref 80.0–100.0)
Platelets: 149 10*3/uL — ABNORMAL LOW (ref 150–400)
RBC: 3.59 MIL/uL — ABNORMAL LOW (ref 4.22–5.81)
RDW: 13.5 % (ref 11.5–15.5)
WBC: 9.7 10*3/uL (ref 4.0–10.5)
nRBC: 0 % (ref 0.0–0.2)

## 2018-06-10 LAB — HEPARIN LEVEL (UNFRACTIONATED): Heparin Unfractionated: 0.3 IU/mL (ref 0.30–0.70)

## 2018-06-10 MED ORDER — POTASSIUM CHLORIDE CRYS ER 20 MEQ PO TBCR
40.0000 meq | EXTENDED_RELEASE_TABLET | Freq: Once | ORAL | Status: AC
  Administered 2018-06-10: 09:00:00 40 meq via ORAL
  Filled 2018-06-10: qty 2

## 2018-06-10 MED ORDER — ENOXAPARIN SODIUM 40 MG/0.4ML ~~LOC~~ SOLN
40.0000 mg | SUBCUTANEOUS | Status: DC
  Administered 2018-06-10 – 2018-06-15 (×6): 40 mg via SUBCUTANEOUS
  Filled 2018-06-10 (×6): qty 0.4

## 2018-06-10 MED ORDER — FUROSEMIDE 40 MG PO TABS
40.0000 mg | ORAL_TABLET | Freq: Two times a day (BID) | ORAL | Status: DC
  Administered 2018-06-10 – 2018-06-15 (×11): 40 mg via ORAL
  Filled 2018-06-10 (×11): qty 1

## 2018-06-10 MED ORDER — ENOXAPARIN SODIUM 40 MG/0.4ML ~~LOC~~ SOLN: 40.0000 mg | SUBCUTANEOUS | Status: DC

## 2018-06-10 NOTE — Care Management Important Message (Signed)
Important Message  Patient Details  Name: Bradley Meadows MRN: [id number] Date of Birth: 08-28-1940   Medicare Important Message Given:  Yes    Orbie Pyo 06/10/2018, 2:13 PM

## 2018-06-10 NOTE — Care Management Important Message (Signed)
Important Message  Patient Details  Name: Bradley Meadows MRN: [id number] Date of Birth: July 09, 1940   Medicare Important Message Given:  Yes    [name], RN 06/10/2018, 2:01 PM

## 2018-06-10 NOTE — Progress Notes (Signed)
Physical Therapy Treatment Patient Details Name: Bradley Meadows MRN: [id number] DOB: January 07, 1941 Today's Date: 06/10/2018    History of Present Illness Bradley Meadows is a 78 y.o. male with medical history significant of CAD, right lung  NCSL stage III, systolic CHF 16%, iron deficiency anemia, GERD, hypertension, active tobacco use, COPD on 4 L nasal cannula, hyperlipidemia was brought to the hospital for evaluation of syncope and hypoxia.    PT Comments    Pt limited to room distance ambulation this session due to pt-reported fatigue and sleepiness. Pt sats when checked were 92% and above on 4LO2, with BP stable and HR up to 120 with exertion. Pt performed limited LE exercises, stating he was too tired at this time to participate beyond ankle pumps and quad sets. PT to continue to follow acutely.    Follow Up Recommendations  SNF;Supervision/Assistance - 24 hour     Equipment Recommendations  Other (comment)(TBD next venue)    Recommendations for Other Services       Precautions / Restrictions Precautions Precautions: Fall Precaution Comments: watch O2, dizziness Restrictions Weight Bearing Restrictions: No    Mobility  Bed Mobility Overal bed mobility: Needs Assistance Bed Mobility: Supine to Sit     Supine to sit: Min assist;HOB elevated     General bed mobility comments: Min assist for LE management, scooting to EOB; pt with use of bedrail and L elbow to come to sitting.   Transfers Overall transfer level: Needs assistance Equipment used: Rolling walker (2 wheeled) Transfers: Sit to/from Stand Sit to Stand: Min assist;From elevated surface         General transfer comment: Min assist for power up, hand placement, and steadying upon standing.   Ambulation/Gait Ambulation/Gait assistance: Min assist Gait Distance (Feet): 25 Feet Assistive device: Rolling walker (2 wheeled) Gait Pattern/deviations: Step-through pattern;Decreased stride length Gait velocity: dec    General Gait Details: Pt with "?" sat reading, unable to get pleth during ambulation. When sats were checked with portable pulse ox, pt satting at 92% and above on 4LO2.    Stairs             Wheelchair Mobility    Modified Rankin (Stroke Patients Only)       Balance Overall balance assessment: Needs assistance   Sitting balance-Leahy Scale: Fair(to good) Sitting balance - Comments: pt sat EOB ~5 minutes, pt reporting dizziness upon sitting up, VSS     Standing balance-Leahy Scale: Poor Standing balance comment: dependent on RW at this time                            Cognition Arousal/Alertness: Awake/alert Behavior During Therapy: WFL for tasks assessed/performed Overall Cognitive Status: Within Functional Limits for tasks assessed                                 General Comments: Pt laughing and joking with PT this session       Exercises General Exercises - Lower Extremity Ankle Circles/Pumps: AROM;Both;5 reps;Seated Quad Sets: AROM;Both;10 reps;Seated    General Comments General comments (skin integrity, edema, etc.): Pt with productive coughing during session       Pertinent Vitals/Pain Pain Assessment: No/denies pain    Home Living                      Prior Function  PT Goals (current goals can now be found in the care plan section) Acute Rehab PT Goals Patient Stated Goal: home PT Goal Formulation: With patient Time For Goal Achievement: 06/22/18 Potential to Achieve Goals: Good Progress towards PT goals: Progressing toward goals    Frequency    Min 3X/week      PT Plan Current plan remains appropriate    Co-evaluation              AM-PAC PT "6 Clicks" Mobility   Outcome Measure  Help needed turning from your back to your side while in a flat bed without using bedrails?: A Little Help needed moving from lying on your back to sitting on the side of a flat bed without using  bedrails?: A Little Help needed moving to and from a bed to a chair (including a wheelchair)?: A Little Help needed standing up from a chair using your arms (e.g., wheelchair or bedside chair)?: A Little Help needed to walk in hospital room?: A Little Help needed climbing 3-5 steps with a railing? : A Lot 6 Click Score: 17    End of Session Equipment Utilized During Treatment: Oxygen;Gait belt Activity Tolerance: Patient limited by fatigue Patient left: in chair;with call bell/phone within reach;with chair alarm set;with nursing/sitter in room Nurse Communication: Mobility status PT Visit Diagnosis: Unsteadiness on feet (R26.81);Muscle weakness (generalized) (M62.81);History of falling (Z91.81)     Time: 4128-7867 PT Time Calculation (min) (ACUTE ONLY): 34 min  Charges:  $Gait Training: 8-22 mins $Therapeutic Activity: 8-22 mins                     Julien Girt, PT Acute Rehabilitation Services Pager [phone]  Office [place] 06/10/2018, 3:24 PM

## 2018-06-10 NOTE — Progress Notes (Signed)
PROGRESS NOTE    Bradley Meadows  OHY:[id number] DOB: [birth year] DOA: 06/07/2018 PCP: Susy Frizzle, MD    Brief Narrative:  78 yo with hx of Sq. Cell lung cancer, COPD, ongoing cigarette smoking, chronic hypoxic respiratory failure on home O2, HTN, CAD admitted 06/07/18 after being found down , O2 sats were 60's, troponins are elevated  Echo reveals moderately reduced LV function with EF 35-40%, cardiology recommends medical management, PT recommends SNF rehab.  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 medical management advised, continued on aspirin, metoprolol, Lipitor -Currently continued on IV heparin -Cardiology had been following. Since signed off, recommending medical management -Will stop heparin gtt today  2)Acute on chronic HFrEF - Repeat Echo from 06/07/2018 with EF of 35 to 94% diastolic dysfunction - patient noted to have combined diastolic and systolic dysfunction CHF exacerbation, continued on IV Lasix 40 mg twice daily -Transition to PO lasix  3) COPD -stable, looking cessation advised, continue bronchodilators, no acute exacerbation at this time -No wheezing on auscultation  4)Rt Lung [facility] Lung Cancer-patient with recent weight loss, follow-up with oncologist post discharge advised....  -Patient noted to be planned for  outpatient PET scan as outpatient for reevaluation of possible lung cancer recurrence soon -Stable at present  5)Chronic hypoxic respiratory failure--- continue to #1 #2 #3 and # 4 above...., at Baseline patient is on 4 L of oxygen via nasal cannula -Stable currently   6)Generalized weakness/Falls -PT recommended SNF -SW following, plan d/c SNF when bed available. Pt is agreeable to rehab  DVT prophylaxis: Heparin gtt Code Status: Full Family Communication: pt in room, family not at bedside Disposition Plan: SNF pending  Consultants:   Cardiology  Procedures:     Antimicrobials: Anti-infectives (From admission, onward)   None      Subjective: No chest pain. Reports increased mucus production. Agreeable to try flutter valve  Objective: Vitals:   06/09/18 0308 06/09/18 1100 06/09/18 1956 06/10/18 0433  BP: 110/60 (!) 124/91  (!) 106/59  Pulse: 94 79  74  Resp: 16     Temp: 98.4 F (36.9 C) 98.6 F (37 C) 98.3 F (36.8 C) 98.8 F (37.1 C)  TempSrc: Oral Oral Oral Oral  SpO2: 97% 97%    Weight: 65.7 kg   63.3 kg  Height:        Intake/Output Summary (Last 24 hours) at 06/10/2018 1124 Last data filed at 06/10/2018 0800 Gross per 24 hour  Intake 478 ml  Output 3275 ml  Net -2797 ml   Filed Weights   06/08/18 0428 06/09/18 0308 06/10/18 0433  Weight: 68.6 kg 65.7 kg 63.3 kg    Examination: General exam: Awake, laying in bed, in nad Respiratory system: Normal respiratory effort, no wheezing, coarse breath sounds Cardiovascular system: regular rate, s1, s2 Gastrointestinal system: Soft, nondistended, positive BS Central nervous system: CN2-12 grossly intact,  strength intact Extremities: Perfused, no clubbing Skin: Normal skin turgor, no notable skin lesions seen Psychiatry: Mood normal // no visual hallucinations   Data Reviewed: I have personally reviewed following labs and imaging studies  CBC: Recent Labs  Lab 06/07/18 0125 06/08/18 0014 06/09/18  0306 06/10/18 0528  WBC 10.9* 7.9 8.6 9.7  NEUTROABS 9.9*  --   --   --   HGB 11.5* 11.2* 11.0* 11.2*  HCT 35.3* 33.2* 32.5* 33.8*  MCV 98.1 92.5 92.6 94.2  PLT 144* 144* 143* 222*   Basic Metabolic Panel: Recent Labs  Lab 06/07/18 0125 06/08/18 0014 06/10/18 0528  NA 131* 132* 133*  K 3.9 3.3* 3.3*  CL 92* 88* 81*  CO2 30 32 41*  GLUCOSE 109* 93 113*  BUN 19 18 14   CREATININE 0.69 0.74 0.70  CALCIUM 8.4* 8.5* 8.3*  MG 1.7  --   --    GFR: Estimated Creatinine Clearance: 68.1 mL/min (by C-G formula based on SCr of 0.7 mg/dL). Liver Function Tests: Recent Labs  Lab 06/07/18 0125 06/08/18 0014  AST 53* 94*  ALT 24 32  ALKPHOS 63 56  BILITOT 1.5* 0.7  PROT 5.8* 4.7*  ALBUMIN 3.5 2.8*   No results for input(s): LIPASE, AMYLASE in the last 168 hours. No results for input(s): AMMONIA in the last 168 hours. Coagulation Profile: Recent Labs  Lab 06/07/18 0408 06/08/18 0014  INR 1.3* 1.3*   Cardiac Enzymes: Recent Labs  Lab 06/07/18 0125 06/07/18 0804 06/07/18 1241 06/07/18 1830 06/08/18 0014  TROPONINI 0.93* 3.26* 2.43* 3.08* 2.49*   BNP (last 3 results) No results for input(s): PROBNP in the last 8760 hours. HbA1C: No results for input(s): HGBA1C in the last 72 hours. CBG: No results for input(s): GLUCAP in the last 168 hours. Lipid Profile: No results for input(s): CHOL, HDL, LDLCALC, TRIG, CHOLHDL, LDLDIRECT in the last 72 hours. Thyroid Function Tests: No results for input(s): TSH, T4TOTAL, FREET4, T3FREE, THYROIDAB in the last 72 hours. Anemia Panel: No results for input(s): VITAMINB12, FOLATE, FERRITIN, TIBC, IRON, RETICCTPCT in the last 72 hours. Sepsis Labs: No results for input(s): PROCALCITON, LATICACIDVEN in the last 168 hours.  Recent Results (from the past 240 hour(s))  SARS Coronavirus 2 (CEPHEID - Performed in [place] hospital lab), Hosp Order     Status: None   Collection Time: 06/07/18  3:21 AM  Result Value Ref Range Status    SARS Coronavirus 2 NEGATIVE NEGATIVE Final    Comment: (NOTE) If result is NEGATIVE SARS-CoV-2 target nucleic acids are NOT DETECTED. The SARS-CoV-2 RNA is generally detectable in upper and lower  respiratory specimens during the acute phase of infection. The lowest  concentration of SARS-CoV-2 viral copies this assay can detect is 250  copies / mL. A negative result does not preclude SARS-CoV-2 infection  and should not be used as the sole basis for treatment or other  patient management decisions.  A negative result may occur with  improper specimen collection / handling, submission of specimen other  than nasopharyngeal swab, presence of viral mutation(s) within the  areas targeted by this assay, and inadequate number of viral copies  (<250 copies / mL). A negative result must be combined with clinical  observations, patient history, and epidemiological information. If result is POSITIVE SARS-CoV-2 target nucleic acids are DETECTED. The SARS-CoV-2 RNA is generally detectable in upper and lower  respiratory specimens dur ing the acute phase of infection.  Positive  results are indicative of active infection  with SARS-CoV-2.  Clinical  correlation with patient history and other diagnostic information is  necessary to determine patient infection status.  Positive results do  not rule out bacterial infection or co-infection with other viruses. If result is PRESUMPTIVE POSTIVE SARS-CoV-2 nucleic acids MAY BE PRESENT.   A presumptive positive result was obtained on the submitted specimen  and confirmed on repeat testing.  While 2019 novel coronavirus  (SARS-CoV-2) nucleic acids may be present in the submitted sample  additional confirmatory testing may be necessary for epidemiological  and / or clinical management purposes  to differentiate between  SARS-CoV-2 and other Sarbecovirus currently known to infect humans.  If clinically indicated additional testing with an alternate test   methodology [phone]) is advised. The SARS-CoV-2 RNA is generally  detectable in upper and lower respiratory sp ecimens during the acute  phase of infection. The expected result is Negative. Fact Sheet for Patients:  StrictlyIdeas.no Fact Sheet for Healthcare Providers: BankingDealers.co.za This test is not yet approved or cleared by the [place] FDA and has been authorized for detection and/or diagnosis of SARS-CoV-2 by FDA under an Emergency Use Authorization (EUA).  This EUA will remain in effect (meaning this test can be used) for the duration of the COVID-19 declaration under Section 564(b)(1) of the Act, 21 U.S.C. section 360bbb-3(b)(1), unless the authorization is terminated or revoked sooner. Performed at [facility], [place] [address] [place], [place] [address]      Radiology Studies: No results found.  Scheduled Meds: . aspirin EC  81 mg Oral Daily  . atorvastatin  40 mg Oral Daily  . enoxaparin (LOVENOX) injection  40 mg Subcutaneous Q24H  . furosemide  80 mg Intravenous Once  . furosemide  40 mg Oral BID  . metoprolol tartrate  12.5 mg Oral BID  . pantoprazole  40 mg Oral Daily  . sucralfate  1 g Oral BID AC   Continuous Infusions:    LOS: 3 days   Marylu Lund, MD [facility] Pager On Amion  If 7PM-7AM, please contact night-coverage 06/10/2018, 11:24 AM

## 2018-06-11 ENCOUNTER — Other Ambulatory Visit: Payer: Medicare Other

## 2018-06-11 ENCOUNTER — Ambulatory Visit: Payer: Medicare Other | Admitting: Hematology & Oncology

## 2018-06-11 LAB — BASIC METABOLIC PANEL
Anion gap: 9 (ref 5–15)
BUN: 17 mg/dL (ref 8–23)
CO2: 42 mmol/L — ABNORMAL HIGH (ref 22–32)
Calcium: 8.6 mg/dL — ABNORMAL LOW (ref 8.9–10.3)
Chloride: 81 mmol/L — ABNORMAL LOW (ref 98–111)
Creatinine, Ser: 0.7 mg/dL (ref 0.61–1.24)
GFR calc Af Amer: >60 mL/min (ref >60)
GFR calc non Af Amer: >60 mL/min (ref >60)
Glucose, Bld: 119 mg/dL — ABNORMAL HIGH (ref 70–99)
Potassium: 3.9 mmol/L (ref 3.5–5.1)
Sodium: 132 mmol/L — ABNORMAL LOW (ref 135–145)

## 2018-06-11 LAB — CBC
HCT: 32.2 % — ABNORMAL LOW (ref 39.0–52.0)
Hemoglobin: 10.7 g/dL — ABNORMAL LOW (ref 13.0–17.0)
MCH: 31.4 pg (ref 26.0–34.0)
MCHC: 33.2 g/dL (ref 30.0–36.0)
MCV: 94.4 fL (ref 80.0–100.0)
Platelets: 144 10*3/uL — ABNORMAL LOW (ref 150–400)
RBC: 3.41 MIL/uL — ABNORMAL LOW (ref 4.22–5.81)
RDW: 13.8 % (ref 11.5–15.5)
WBC: 11.3 10*3/uL — ABNORMAL HIGH (ref 4.0–10.5)
nRBC: 0 % (ref 0.0–0.2)

## 2018-06-11 NOTE — Progress Notes (Signed)
Occupational Therapy Treatment Patient Details Name: Bradley Meadows MRN: [id number] DOB: December 31, 1940 Today's Date: 06/11/2018    History of present illness Bradley Meadows is a 78 y.o. male with medical history significant of CAD, right lung  NCSL stage III, systolic CHF 09%, iron deficiency anemia, GERD, hypertension, active tobacco use, COPD on 4 L nasal cannula, hyperlipidemia was brought to the hospital for evaluation of syncope and hypoxia.   OT comments  Pt. Seen for skilled OT.  Able to complete bed mobility min a and sit/stand, stand pivot transfer to recliner min a.  Requires encouragement to complete tasks.  Note d/c pending for snf next day or so.    Follow Up Recommendations  SNF;Supervision/Assistance - 24 hour    Equipment Recommendations  Other (comment)    Recommendations for Other Services      Precautions / Restrictions Precautions Precautions: Fall Precaution Comments: watch O2, dizziness       Mobility Bed Mobility Overal bed mobility: Needs Assistance Bed Mobility: Supine to Sit     Supine to sit: Min assist;HOB elevated     General bed mobility comments: Min assist for LE management, scooting to EOB; pt with use of bedrail and L elbow to come to sitting.   Transfers Overall transfer level: Needs assistance Equipment used: Rolling walker (2 wheeled) Transfers: Sit to/from [facility] Sit to Stand: Min assist;From elevated surface         General transfer comment: Min assist for power up, hand placement, and steadying upon standing.     Balance                                           ADL either performed or assessed with clinical judgement   ADL Overall ADL's : Needs assistance/impaired                         Toilet Transfer: Minimal assistance;Stand-pivot;RW Toilet Transfer Details (indicate cue type and reason): simulated via transfer to/from recliner; pt practicing taking multiple steps foward and  back using RW         Functional mobility during ADLs: Minimal assistance;Rolling walker General ADL Comments: pt mostly limited due to decreased activity tolerance, weakness     Vision       Perception     Praxis      Cognition Arousal/Alertness: Awake/alert Behavior During Therapy: WFL for tasks assessed/performed Overall Cognitive Status: Within Functional Limits for tasks assessed                                          Exercises     Shoulder Instructions       General Comments      Pertinent Vitals/ Pain       Pain Assessment: No/denies pain  Home Living                                          Prior Functioning/Environment              Frequency  Min 2X/week        Progress Toward Goals  OT Goals(current goals can now be  found in the care plan section)  Progress towards OT goals: Progressing toward goals     Plan      Co-evaluation                 AM-PAC OT "6 Clicks" Daily Activity     Outcome Measure   Help from another person eating meals?: None Help from another person taking care of personal grooming?: A Little Help from another person toileting, which includes using toliet, bedpan, or urinal?: A Lot Help from another person bathing (including washing, rinsing, drying)?: A Lot Help from another person to put on and taking off regular upper body clothing?: A Little Help from another person to put on and taking off regular lower body clothing?: A Lot 6 Click Score: 16    End of Session Equipment Utilized During Treatment: Gait belt;Rolling walker;Oxygen  OT Visit Diagnosis: Muscle weakness (generalized) (M62.81)   Activity Tolerance Patient tolerated treatment well   Patient Left in chair;with call bell/phone within reach;with chair alarm set   Nurse Communication Other (comment)(notified CNA pt. wanting to get washed up)        Time: 2334-3568 OT Time Calculation (min): 11  min  Charges: OT General Charges $OT Visit: 1 Visit OT Treatments $Self Care/Home Management : 8-22 mins  Janice Coffin, COTA/L 06/11/2018, 1:29 PM

## 2018-06-11 NOTE — Progress Notes (Signed)
PROGRESS NOTE    Bradley Meadows  QMG:[id number] DOB: 22-Dec-1940 DOA: 06/07/2018 PCP: Bradley Frizzle, MD    Brief Narrative:  78 yo with hx of Sq. Cell lung cancer, COPD, ongoing cigarette smoking, chronic hypoxic respiratory failure on home O2, HTN, CAD admitted 06/07/18 after being found down , O2 sats were 60's, troponins are elevated  Echo reveals moderately reduced LV function with EF 35-40%, cardiology recommends medical management, PT recommends SNF rehab.  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 medical management advised, continued on aspirin, metoprolol, Lipitor -Currently continued on IV heparin -Cardiology had been following. Since signed off, recommending medical management -Now off heparin gtt. Stable thus far  2)Acute on chronic HFrEF - Repeat Echo from 06/07/2018 with EF of 35 to 32% diastolic dysfunction - patient noted to have combined diastolic and systolic dysfunction CHF exacerbation, continued on IV Lasix 40 mg twice daily -Now on PO lasix. No LE edema this AM  3) COPD -stable, looking cessation advised, continue bronchodilators, no acute exacerbation at this time -No wheezing on auscultation this AM  4)Rt Lung SCC Lung Cancer-patient with recent weight loss, follow-up with oncologist post discharge advised....   -Patient noted to be planned for outpatient PET scan as outpatient for reevaluation of possible lung cancer recurrence soon -Remains stable at this time  5)Chronic hypoxic respiratory failure--- continue to #1 #2 #3 and # 4 above...., at Baseline patient is on 4 L of oxygen via nasal cannula -Currently stable  6)Generalized weakness/Falls -PT recommended SNF -SW following, plan d/c SNF when bed available. Pt is agreeable to rehab  DVT prophylaxis: Heparin gtt Code Status: Full Family Communication: pt in room, family not at bedside Disposition Plan: SNF pending  Consultants:   Cardiology  Procedures:     Antimicrobials: Anti-infectives (From admission, onward)   None      Subjective: Denies chest pain or sob  Objective: Vitals:   06/10/18 1432 06/10/18 2016 06/11/18 0548 06/11/18 1440  BP: 114/74 (!) 101/52 (!) 98/54 (!) 92/53  Pulse: 89 78  79  Resp: 20 17 (!) 22 (!) 22  Temp: 99.7 F (37.6 C) 98.9 F (37.2 C) 98.4 F (36.9 C) 99.1 F (37.3 C)  TempSrc: Oral Oral Oral Oral  SpO2: 92% 98% 93% 94%  Weight:   63.9 kg   Height:        Intake/Output Summary (Last 24 hours) at 06/11/2018 1503 Last data filed at 06/11/2018 1442 Gross per 24 hour  Intake 716 ml  Output 750 ml  Net -34 ml   Filed Weights   06/09/18 0308 06/10/18 0433 06/11/18 0548  Weight: 65.7 kg 63.3 kg 63.9 kg    Examination: General exam: Conversant, in no acute distress Respiratory system: normal chest rise, coarse breath sounds, no audible wheezing Cardiovascular system: regular rhythm, s1-s2 Gastrointestinal system: Nondistended, nontender, pos BS Central nervous  system: No seizures, no tremors Extremities: No cyanosis, no joint deformities Skin: No rashes, no pallor Psychiatry: Affect normal // no auditory hallucinations   Data Reviewed: I have personally reviewed following labs and imaging studies  CBC: Recent Labs  Lab 06/07/18 0125 06/08/18 0014 06/09/18 0306 06/10/18  0528 06/11/18 0209  WBC 10.9* 7.9 8.6 9.7 11.3*  NEUTROABS 9.9*  --   --   --   --   HGB 11.5* 11.2* 11.0* 11.2* 10.7*  HCT 35.3* 33.2* 32.5* 33.8* 32.2*  MCV 98.1 92.5 92.6 94.2 94.4  PLT 144* 144* 143* 149* 720*   Basic Metabolic Panel: Recent Labs  Lab 06/07/18 0125 06/08/18 0014 06/10/18 0528 06/11/18 0209  NA 131* 132* 133* 132*  K 3.9 3.3* 3.3* 3.9  CL 92* 88* 81* 81*  CO2 30 32 41* 42*  GLUCOSE 109* 93 113* 119*  BUN 19 18 14 17   CREATININE 0.69 0.74 0.70 0.70  CALCIUM 8.4* 8.5* 8.3* 8.6*  MG 1.7  --   --   --    GFR: Estimated Creatinine Clearance: 68.8 mL/min (by C-G formula based on SCr of 0.7 mg/dL). Liver Function Tests: Recent Labs  Lab 06/07/18 0125 06/08/18 0014  AST 53* 94*  ALT 24 32  ALKPHOS 63 56  BILITOT 1.5* 0.7  PROT 5.8* 4.7*  ALBUMIN 3.5 2.8*   No results for input(s): LIPASE, AMYLASE in the last 168 hours. No results for input(s): AMMONIA in the last 168 hours. Coagulation Profile: Recent Labs  Lab 06/07/18 0408 06/08/18 0014  INR 1.3* 1.3*   Cardiac Enzymes: Recent Labs  Lab 06/07/18 0125 06/07/18 0804 06/07/18 1241 06/07/18 1830 06/08/18 0014  TROPONINI 0.93* 3.26* 2.43* 3.08* 2.49*   BNP (last 3 results) No results for input(s): PROBNP in the last 8760 hours. HbA1C: No results for input(s): HGBA1C in the last 72 hours. CBG: No results for input(s): GLUCAP in the last 168 hours. Lipid Profile: No results for input(s): CHOL, HDL, LDLCALC, TRIG, CHOLHDL, LDLDIRECT in the last 72 hours. Thyroid Function Tests: No results for input(s): TSH, T4TOTAL, FREET4, T3FREE, THYROIDAB in the last 72 hours. Anemia Panel: No results for input(s): VITAMINB12, FOLATE, FERRITIN, TIBC, IRON, RETICCTPCT in the last 72 hours. Sepsis Labs: No results for input(s): PROCALCITON, LATICACIDVEN in the last 168 hours.  Recent Results (from the past 240 hour(s))  SARS Coronavirus 2 (CEPHEID - Performed in [place] hospital lab), Hosp Order      Status: None   Collection Time: 06/07/18  3:21 AM  Result Value Ref Range Status   SARS Coronavirus 2 NEGATIVE NEGATIVE Final    Comment: (NOTE) If result is NEGATIVE SARS-CoV-2 target nucleic acids are NOT DETECTED. The SARS-CoV-2 RNA is generally detectable in upper and lower  respiratory specimens during the acute phase of infection. The lowest  concentration of SARS-CoV-2 viral copies this assay can detect is 250  copies / mL. A negative result does not preclude SARS-CoV-2 infection  and should not be used as the sole basis for treatment or other  patient management decisions.  A negative result may occur with  improper specimen collection / handling, submission of specimen other  than nasopharyngeal swab, presence of viral mutation(s) within the  areas targeted by this assay, and inadequate number of viral copies  (<250 copies / mL). A negative result must be combined with clinical  observations, patient history, and epidemiological information. If result is POSITIVE SARS-CoV-2 target nucleic acids are DETECTED. The SARS-CoV-2 RNA is generally  detectable in upper and lower  respiratory specimens dur ing the acute phase of infection.  Positive  results are indicative of active infection with SARS-CoV-2.  Clinical  correlation with patient history and other diagnostic information is  necessary to determine patient infection status.  Positive results do  not rule out bacterial infection or co-infection with other viruses. If result is PRESUMPTIVE POSTIVE SARS-CoV-2 nucleic acids MAY BE PRESENT.   A presumptive positive result was obtained on the submitted specimen  and confirmed on repeat testing.  While 2019 novel coronavirus  (SARS-CoV-2) nucleic acids may be present in the submitted sample  additional confirmatory testing may be necessary for epidemiological  and / or clinical management purposes  to differentiate between  SARS-CoV-2 and other Sarbecovirus currently known to  infect humans.  If clinically indicated additional testing with an alternate test  methodology [phone]) is advised. The SARS-CoV-2 RNA is generally  detectable in upper and lower respiratory sp ecimens during the acute  phase of infection. The expected result is Negative. Fact Sheet for Patients:  StrictlyIdeas.no Fact Sheet for Healthcare Providers: BankingDealers.co.za This test is not yet approved or cleared by the [place] FDA and has been authorized for detection and/or diagnosis of SARS-CoV-2 by FDA under an Emergency Use Authorization (EUA).  This EUA will remain in effect (meaning this test can be used) for the duration of the COVID-19 declaration under Section 564(b)(1) of the Act, 21 U.S.C. section 360bbb-3(b)(1), unless the authorization is terminated or revoked sooner. Performed at [facility], [place] [address] [place], [place] [address]      Radiology Studies: No results found.  Scheduled Meds: . aspirin EC  81 mg Oral Daily  . atorvastatin  40 mg Oral Daily  . enoxaparin (LOVENOX) injection  40 mg Subcutaneous Q24H  . furosemide  80 mg Intravenous Once  . furosemide  40 mg Oral BID  . metoprolol tartrate  12.5 mg Oral BID  . pantoprazole  40 mg Oral Daily  . sucralfate  1 g Oral BID AC   Continuous Infusions:    LOS: 4 days   Marylu Lund, MD [facility] Pager On Amion  If 7PM-7AM, please contact night-coverage 06/11/2018, 3:03 PM

## 2018-06-11 NOTE — TOC Progression Note (Addendum)
Transition of Care (TOC) - Progression Note  Marvetta Gibbons RN, BSN Transitions of Care Unit 4E- RN Case Manager [phone]   Patient Details  Name: MATTHEU BRODERSEN MRN: [id number] Date of Birth: [birth year]  Transition [facility]) CM/SW Contact  Dahlia Client, Romeo Rabon, RN Phone Number: 06/11/2018, 10:22 AM  Clinical Narrative:    Per daughter request have sent referrals to multiple facilities in w/s which would be closer to family- [facility] which is available today however they are unable to verify pt's Medicare days (Medicare is in Dark Days with system upgrade) and will have to hold admission to Monday May 11 per conversation with Elie Confer [phone].  MD aware and daughter has been updated. [facility] will go ahead and e-mail daughter the paperwork to be completed so that it is ready for Monday admission. CM will f/u on Monday to confirm with [facility] and get bed assignment.  (fax # to [facility] [phone])  Expected Discharge Plan: [place] Barriers to Discharge: Continued Medical Work up  Expected Discharge Plan and Services Expected Discharge Plan: [place] In-house Referral: Clinical Social Work, [facility] Discharge Planning Services: CM Consult Post Acute Care Choice: [place] Living arrangements for the past 2 months: Single Family Home Expected Discharge Date: (unknown)                                     Social Determinants of Health (SDOH) Interventions    Readmission Risk Interventions No flowsheet data found.

## 2018-06-12 LAB — BASIC METABOLIC PANEL
Anion gap: 7 (ref 5–15)
BUN: 17 mg/dL (ref 8–23)
CO2: 40 mmol/L — ABNORMAL HIGH (ref 22–32)
Calcium: 8.6 mg/dL — ABNORMAL LOW (ref 8.9–10.3)
Chloride: 88 mmol/L — ABNORMAL LOW (ref 98–111)
Creatinine, Ser: 0.65 mg/dL (ref 0.61–1.24)
GFR calc Af Amer: >60 mL/min (ref >60)
GFR calc non Af Amer: >60 mL/min (ref >60)
Glucose, Bld: 104 mg/dL — ABNORMAL HIGH (ref 70–99)
Potassium: 3.9 mmol/L (ref 3.5–5.1)
Sodium: 135 mmol/L (ref 135–145)

## 2018-06-12 LAB — CBC
HCT: 33.5 % — ABNORMAL LOW (ref 39.0–52.0)
Hemoglobin: 11 g/dL — ABNORMAL LOW (ref 13.0–17.0)
MCH: 31.3 pg (ref 26.0–34.0)
MCHC: 32.8 g/dL (ref 30.0–36.0)
MCV: 95.4 fL (ref 80.0–100.0)
Platelets: 154 10*3/uL (ref 150–400)
RBC: 3.51 MIL/uL — ABNORMAL LOW (ref 4.22–5.81)
RDW: 13.8 % (ref 11.5–15.5)
WBC: 9.9 10*3/uL (ref 4.0–10.5)
nRBC: 0 % (ref 0.0–0.2)

## 2018-06-12 NOTE — Progress Notes (Addendum)
Encouraged patient to get into chair today. Patient stated he was "all chaired out" but would let me know if he would get out of bed into chair. Will continue to encourage. Brantlee Penn, Bettina Gavia rN

## 2018-06-12 NOTE — Progress Notes (Signed)
PROGRESS NOTE    Bradley Meadows  NUU:[id number] DOB: [birth year] DOA: 06/07/2018 PCP: Bradley Frizzle, MD    Brief Narrative:  78 yo with hx of Sq. Cell lung cancer, COPD, ongoing cigarette smoking, chronic hypoxic respiratory failure on home O2, HTN, CAD admitted 06/07/18 after being found down , O2 sats were 60's, troponins are elevated  Echo reveals moderately reduced LV function with EF 35-40%, cardiology recommends medical management, PT recommends SNF rehab.  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 medical management advised, continued on aspirin, metoprolol, Lipitor -Currently continued on IV heparin -Cardiology had been following. Since signed off, recommending medical management -Denies chest pain this AM. Of anticoagulation  2)Acute on chronic HFrEF - Repeat Echo from 06/07/2018 with EF of 35 to 34% diastolic dysfunction - patient noted to have combined diastolic and systolic dysfunction CHF exacerbation, continued on IV Lasix 40 mg twice daily -Tolerating PO lasix. No LE edema  3) COPD -stable, looking cessation advised, continue bronchodilators, no acute exacerbation at this time -Remains on baseline O2, no audible wheezing  4)Rt Lung [facility] Lung Cancer-patient with recent weight loss, follow-up with oncologist post discharge  advised....  -Patient noted to be planned for outpatient PET scan as outpatient for reevaluation of possible lung cancer recurrence soon -Currently stable  5)Chronic hypoxic respiratory failure--- continue to #1 #2 #3 and # 4 above...., at Baseline patient is on 4 L of oxygen via nasal cannula -Currently stable  6)Generalized weakness/Falls -PT recommended SNF -SW following, planning on d/c to SNF  DVT prophylaxis: Heparin gtt Code Status: Full Family Communication: pt in room, family not at bedside Disposition Plan: SNF pending  Consultants:   Cardiology  Procedures:     Antimicrobials: Anti-infectives (From admission, onward)   None      Subjective: Asking for two cups of chocolate pudding  Objective: Vitals:   06/11/18 1440 06/11/18 1936 06/12/18 0623 06/12/18 0820  BP: (!) 92/53 109/69 112/83 (!) 111/57  Pulse: 79 73 66 87  Resp: (!) 22 (!) 21 19   Temp: 99.1 F (37.3 C) 98.8 F (37.1 C) 98.3 F (36.8 C)   TempSrc: Oral Oral Oral   SpO2: 94% 100% 95%   Weight:   64.4 kg   Height:        Intake/Output Summary (Last 24 hours) at 06/12/2018 1204 Last data filed at 06/12/2018 0840 Gross per 24 hour  Intake 480 ml  Output 650 ml  Net -170 ml   Filed Weights   06/10/18 0433 06/11/18 0548 06/12/18 0623  Weight: 63.3 kg 63.9 kg 64.4 kg    Examination: General exam: Awake, laying in bed, in nad Respiratory system: Normal respiratory effort, no wheezing Cardiovascular system: regular rate, s1, s2 Gastrointestinal system: Soft, nondistended, positive BS Central nervous system: CN2-12 grossly intact, strength intact Extremities: Perfused, no clubbing Skin: Normal skin turgor, no  notable skin lesions seen Psychiatry: Mood normal // no visual hallucinations   Data Reviewed: I have personally reviewed following labs and imaging studies  CBC: Recent Labs  Lab 06/07/18 0125 06/08/18 0014 06/09/18 0306 06/10/18 0528 06/11/18 0209 06/12/18 0245  WBC  10.9* 7.9 8.6 9.7 11.3* 9.9  NEUTROABS 9.9*  --   --   --   --   --   HGB 11.5* 11.2* 11.0* 11.2* 10.7* 11.0*  HCT 35.3* 33.2* 32.5* 33.8* 32.2* 33.5*  MCV 98.1 92.5 92.6 94.2 94.4 95.4  PLT 144* 144* 143* 149* 144* 244   Basic Metabolic Panel: Recent Labs  Lab 06/07/18 0125 06/08/18 0014 06/10/18 0528 06/11/18 0209 06/12/18 0245  NA 131* 132* 133* 132* 135  K 3.9 3.3* 3.3* 3.9 3.9  CL 92* 88* 81* 81* 88*  CO2 30 32 41* 42* 40*  GLUCOSE 109* 93 113* 119* 104*  BUN 19 18 14 17 17   CREATININE 0.69 0.74 0.70 0.70 0.65  CALCIUM 8.4* 8.5* 8.3* 8.6* 8.6*  MG 1.7  --   --   --   --    GFR: Estimated Creatinine Clearance: 69.3 mL/min (by C-G formula based on SCr of 0.65 mg/dL). Liver Function Tests: Recent Labs  Lab 06/07/18 0125 06/08/18 0014  AST 53* 94*  ALT 24 32  ALKPHOS 63 56  BILITOT 1.5* 0.7  PROT 5.8* 4.7*  ALBUMIN 3.5 2.8*   No results for input(s): LIPASE, AMYLASE in the last 168 hours. No results for input(s): AMMONIA in the last 168 hours. Coagulation Profile: Recent Labs  Lab 06/07/18 0408 06/08/18 0014  INR 1.3* 1.3*   Cardiac Enzymes: Recent Labs  Lab 06/07/18 0125 06/07/18 0804 06/07/18 1241 06/07/18 1830 06/08/18 0014  TROPONINI 0.93* 3.26* 2.43* 3.08* 2.49*   BNP (last 3 results) No results for input(s): PROBNP in the last 8760 hours. HbA1C: No results for input(s): HGBA1C in the last 72 hours. CBG: No results for input(s): GLUCAP in the last 168 hours. Lipid Profile: No results for input(s): CHOL, HDL, LDLCALC, TRIG, CHOLHDL, LDLDIRECT in the last 72 hours. Thyroid Function Tests: No results for input(s): TSH, T4TOTAL, FREET4, T3FREE, THYROIDAB in the last 72 hours. Anemia Panel: No results for input(s): VITAMINB12, FOLATE, FERRITIN, TIBC, IRON, RETICCTPCT in the last 72 hours. Sepsis Labs: No results for input(s): PROCALCITON, LATICACIDVEN in the last 168 hours.  Recent Results (from the past 240 hour(s))  SARS Coronavirus 2  (CEPHEID - Performed in [facility] hospital lab), Hosp Order     Status: None   Collection Time: 06/07/18  3:21 AM  Result Value Ref Range Status   SARS Coronavirus 2 NEGATIVE NEGATIVE Final    Comment: (NOTE) If result is NEGATIVE SARS-CoV-2 target nucleic acids are NOT DETECTED. The SARS-CoV-2 RNA is generally detectable in upper and lower  respiratory specimens during the acute phase of infection. The lowest  concentration of SARS-CoV-2 viral copies this assay can detect is 250  copies / mL. A negative result does not preclude SARS-CoV-2 infection  and should not be used as the sole basis for treatment or other  patient management decisions.  A negative result may occur with  improper specimen collection / handling, submission of specimen other  than nasopharyngeal swab, presence of viral mutation(s) within the  areas targeted by this assay, and inadequate number of viral copies  (<250 copies / mL). A negative result must be combined with clinical  observations, patient history, and epidemiological information. If result is POSITIVE  SARS-CoV-2 target nucleic acids are DETECTED. The SARS-CoV-2 RNA is generally detectable in upper and lower  respiratory specimens dur ing the acute phase of infection.  Positive  results are indicative of active infection with SARS-CoV-2.  Clinical  correlation with patient history and other diagnostic information is  necessary to determine patient infection status.  Positive results do  not rule out bacterial infection or co-infection with other viruses. If result is PRESUMPTIVE POSTIVE SARS-CoV-2 nucleic acids MAY BE PRESENT.   A presumptive positive result was obtained on the submitted specimen  and confirmed on repeat testing.  While 2019 novel coronavirus  (SARS-CoV-2) nucleic acids may be present in the submitted sample  additional confirmatory testing may be necessary for epidemiological  and / or clinical management purposes  to differentiate  between  SARS-CoV-2 and other Sarbecovirus currently known to infect humans.  If clinically indicated additional testing with an alternate test  methodology [phone]) is advised. The SARS-CoV-2 RNA is generally  detectable in upper and lower respiratory sp ecimens during the acute  phase of infection. The expected result is Negative. Fact Sheet for Patients:  StrictlyIdeas.no Fact Sheet for Healthcare Providers: BankingDealers.co.za This test is not yet approved or cleared by the [place] FDA and has been authorized for detection and/or diagnosis of SARS-CoV-2 by FDA under an Emergency Use Authorization (EUA).  This EUA will remain in effect (meaning this test can be used) for the duration of the COVID-19 declaration under Section 564(b)(1) of the Act, 21 U.S.C. section 360bbb-3(b)(1), unless the authorization is terminated or revoked sooner. Performed at [facility], [place] [address] [place], [place] [address]      Radiology Studies: No results found.  Scheduled Meds: . aspirin EC  81 mg Oral Daily  . atorvastatin  40 mg Oral Daily  . enoxaparin (LOVENOX) injection  40 mg Subcutaneous Q24H  . furosemide  80 mg Intravenous Once  . furosemide  40 mg Oral BID  . metoprolol tartrate  12.5 mg Oral BID  . pantoprazole  40 mg Oral Daily  . sucralfate  1 g Oral BID AC   Continuous Infusions:    LOS: 5 days   Marylu Lund, MD [facility] Pager On Amion  If 7PM-7AM, please contact night-coverage 06/12/2018, 12:04 PM

## 2018-06-13 LAB — BASIC METABOLIC PANEL
Anion gap: 11 (ref 5–15)
BUN: 16 mg/dL (ref 8–23)
CO2: 37 mmol/L — ABNORMAL HIGH (ref 22–32)
Calcium: 8.7 mg/dL — ABNORMAL LOW (ref 8.9–10.3)
Chloride: 87 mmol/L — ABNORMAL LOW (ref 98–111)
Creatinine, Ser: 0.67 mg/dL (ref 0.61–1.24)
GFR calc Af Amer: >60 mL/min (ref >60)
GFR calc non Af Amer: >60 mL/min (ref >60)
Glucose, Bld: 101 mg/dL — ABNORMAL HIGH (ref 70–99)
Potassium: 4 mmol/L (ref 3.5–5.1)
Sodium: 135 mmol/L (ref 135–145)

## 2018-06-13 LAB — CBC
HCT: 34.6 % — ABNORMAL LOW (ref 39.0–52.0)
Hemoglobin: 11.2 g/dL — ABNORMAL LOW (ref 13.0–17.0)
MCH: 30.9 pg (ref 26.0–34.0)
MCHC: 32.4 g/dL (ref 30.0–36.0)
MCV: 95.3 fL (ref 80.0–100.0)
Platelets: 162 10*3/uL (ref 150–400)
RBC: 3.63 MIL/uL — ABNORMAL LOW (ref 4.22–5.81)
RDW: 13.7 % (ref 11.5–15.5)
WBC: 8.8 10*3/uL (ref 4.0–10.5)
nRBC: 0 % (ref 0.0–0.2)

## 2018-06-13 NOTE — Progress Notes (Signed)
Patient agreed to get into chair. Patient assist x1 to chair. Patient unsteady to just stand and pivot. Will continue to monitor patient. Raeli Wiens, Bettina Gavia rN

## 2018-06-13 NOTE — Progress Notes (Signed)
PROGRESS NOTE    SHNEUR WHITTENBURG  JYN:[id number] DOB: [birth year] DOA: 06/07/2018 PCP: Susy Frizzle, MD    Brief Narrative:  78 yo with hx of Sq. Cell lung cancer, COPD, ongoing cigarette smoking, chronic hypoxic respiratory failure on home O2, HTN, CAD admitted 06/07/18 after being found down , O2 sats were 60's, troponins are elevated  Echo reveals moderately reduced LV function with EF 35-40%, cardiology recommends medical management, PT recommends SNF rehab.  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 medical management advised, continued on aspirin, metoprolol, Lipitor -Currently continued on IV heparin -Cardiology had been following. Since signed off, recommending medical management -Without chest pain  2)Acute on chronic HFrEF - Repeat Echo from 06/07/2018 with EF of 35 to 86% diastolic dysfunction - patient noted to have combined diastolic and systolic dysfunction CHF exacerbation, continued on IV Lasix 40 mg twice daily -Tolerating PO lasix. Edema resolved  3) COPD -stable, looking cessation advised, continue bronchodilators, no acute exacerbation at this time -Remains on baseline O2, no audible wheezing on exam  4)Rt Lung [facility] Lung Cancer-patient with recent weight loss, follow-up with oncologist post discharge advised....  -Patient  noted to be planned for outpatient PET scan as outpatient for reevaluation of possible lung cancer recurrence soon -Presently stable  5)Chronic hypoxic respiratory failure--- continue to #1 #2 #3 and # 4 above...., at Baseline patient is on 4 L of oxygen via nasal cannula -Presently stable  6)Generalized weakness/Falls -PT recommended SNF -SW following, anticipate d/c to snf in next 24 hrs  DVT prophylaxis: Heparin gtt Code Status: Full Family Communication: pt in room, family not at bedside Disposition Plan: SNF pending  Consultants:   Cardiology  Procedures:     Antimicrobials: Anti-infectives (From admission, onward)   None      Subjective: Without complaints. Eager to be discharged  Objective: Vitals:   06/13/18 0643 06/13/18 0819 06/13/18 0822 06/13/18 1145  BP:  118/72 118/72 (!) 106/56  Pulse: 68 (!) 139 80 (!) 50  Resp: (!) 25 19 19  (!) 35  Temp:  99.6 F (37.6 C)  99 F (37.2 C)  TempSrc:  Oral  Oral  SpO2: (!) 87% 94% 93% 94%  Weight: 66.9 kg     Height:        Intake/Output Summary (Last 24 hours) at 06/13/2018 1415 Last data filed at 06/13/2018 1300 Gross per 24 hour  Intake 400 ml  Output 2200 ml  Net -1800 ml   Filed Weights   06/11/18 0548 06/12/18 0623 06/13/18 0643  Weight: 63.9 kg 64.4 kg 66.9 kg    Examination: General exam: Conversant, in no acute distress Respiratory system: normal chest rise, clear, no audible wheezing Cardiovascular system: regular rhythm, s1-s2 Gastrointestinal system: Nondistended, nontender, pos BS Central nervous system: No seizures, no tremors Extremities: No cyanosis, no joint deformities Skin: No rashes, no pallor Psychiatry:  Affect normal // no auditory hallucinations   Data Reviewed: I have personally reviewed following labs and imaging studies  CBC: Recent Labs  Lab 06/07/18 0125  06/09/18 0306 06/10/18 0528 06/11/18 0209 06/12/18 0245 06/13/18 0419  WBC 10.9*   < > 8.6 9.7 11.3* 9.9 8.8   NEUTROABS 9.9*  --   --   --   --   --   --   HGB 11.5*   < > 11.0* 11.2* 10.7* 11.0* 11.2*  HCT 35.3*   < > 32.5* 33.8* 32.2* 33.5* 34.6*  MCV 98.1   < > 92.6 94.2 94.4 95.4 95.3  PLT 144*   < > 143* 149* 144* 154 162   < > = values in this interval not displayed.   Basic Metabolic Panel: Recent Labs  Lab 06/07/18 0125 06/08/18 0014 06/10/18 0528 06/11/18 0209 06/12/18 0245 06/13/18 0419  NA 131* 132* 133* 132* 135 135  K 3.9 3.3* 3.3* 3.9 3.9 4.0  CL 92* 88* 81* 81* 88* 87*  CO2 30 32 41* 42* 40* 37*  GLUCOSE 109* 93 113* 119* 104* 101*  BUN 19 18 14 17 17 16   CREATININE 0.69 0.74 0.70 0.70 0.65 0.67  CALCIUM 8.4* 8.5* 8.3* 8.6* 8.6* 8.7*  MG 1.7  --   --   --   --   --    GFR: Estimated Creatinine Clearance: 72 mL/min (by C-G formula based on SCr of 0.67 mg/dL). Liver Function Tests: Recent Labs  Lab 06/07/18 0125 06/08/18 0014  AST 53* 94*  ALT 24 32  ALKPHOS 63 56  BILITOT 1.5* 0.7  PROT 5.8* 4.7*  ALBUMIN 3.5 2.8*   No results for input(s): LIPASE, AMYLASE in the last 168 hours. No results for input(s): AMMONIA in the last 168 hours. Coagulation Profile: Recent Labs  Lab 06/07/18 0408 06/08/18 0014  INR 1.3* 1.3*   Cardiac Enzymes: Recent Labs  Lab 06/07/18 0125 06/07/18 0804 06/07/18 1241 06/07/18 1830 06/08/18 0014  TROPONINI 0.93* 3.26* 2.43* 3.08* 2.49*   BNP (last 3 results) No results for input(s): PROBNP in the last 8760 hours. HbA1C: No results for input(s): HGBA1C in the last 72 hours. CBG: No results for input(s): GLUCAP in the last 168 hours. Lipid Profile: No results for input(s): CHOL, HDL, LDLCALC, TRIG, CHOLHDL, LDLDIRECT in the last 72 hours. Thyroid Function Tests: No results for input(s): TSH, T4TOTAL, FREET4, T3FREE, THYROIDAB in the last 72 hours. Anemia Panel: No results for input(s): VITAMINB12, FOLATE, FERRITIN, TIBC, IRON, RETICCTPCT in the last 72 hours. Sepsis Labs: No results for input(s): PROCALCITON,  LATICACIDVEN in the last 168 hours.  Recent Results (from the past 240 hour(s))  SARS Coronavirus 2 (CEPHEID - Performed in [place] hospital lab), Hosp Order     Status: None   Collection Time: 06/07/18  3:21 AM  Result Value Ref Range Status   SARS Coronavirus 2 NEGATIVE NEGATIVE Final    Comment: (NOTE) If result is NEGATIVE SARS-CoV-2 target nucleic acids are NOT DETECTED. The SARS-CoV-2 RNA is generally detectable in upper and lower  respiratory specimens during the acute phase of infection. The lowest  concentration of SARS-CoV-2 viral copies this assay can detect is 250  copies / mL. A negative result does not preclude SARS-CoV-2 infection  and should not be used as the sole basis for treatment or other  patient management decisions.  A negative result may occur with  improper specimen collection / handling, submission of specimen other  than nasopharyngeal  swab, presence of viral mutation(s) within the  areas targeted by this assay, and inadequate number of viral copies  (<250 copies / mL). A negative result must be combined with clinical  observations, patient history, and epidemiological information. If result is POSITIVE SARS-CoV-2 target nucleic acids are DETECTED. The SARS-CoV-2 RNA is generally detectable in upper and lower  respiratory specimens dur ing the acute phase of infection.  Positive  results are indicative of active infection with SARS-CoV-2.  Clinical  correlation with patient history and other diagnostic information is  necessary to determine patient infection status.  Positive results do  not rule out bacterial infection or co-infection with other viruses. If result is PRESUMPTIVE POSTIVE SARS-CoV-2 nucleic acids MAY BE PRESENT.   A presumptive positive result was obtained on the submitted specimen  and confirmed on repeat testing.  While 2019 novel coronavirus  (SARS-CoV-2) nucleic acids may be present in the submitted sample  additional confirmatory  testing may be necessary for epidemiological  and / or clinical management purposes  to differentiate between  SARS-CoV-2 and other Sarbecovirus currently known to infect humans.  If clinically indicated additional testing with an alternate test  methodology [phone]) is advised. The SARS-CoV-2 RNA is generally  detectable in upper and lower respiratory sp ecimens during the acute  phase of infection. The expected result is Negative. Fact Sheet for Patients:  StrictlyIdeas.no Fact Sheet for Healthcare Providers: BankingDealers.co.za This test is not yet approved or cleared by the [place] FDA and has been authorized for detection and/or diagnosis of SARS-CoV-2 by FDA under an Emergency Use Authorization (EUA).  This EUA will remain in effect (meaning this test can be used) for the duration of the COVID-19 declaration under Section 564(b)(1) of the Act, 21 U.S.C. section 360bbb-3(b)(1), unless the authorization is terminated or revoked sooner. Performed at [facility], [place] [address], [place], [place] [address]      Radiology Studies: No results found.  Scheduled Meds: . aspirin EC  81 mg Oral Daily  . atorvastatin  40 mg Oral Daily  . enoxaparin (LOVENOX) injection  40 mg Subcutaneous Q24H  . furosemide  80 mg Intravenous Once  . furosemide  40 mg Oral BID  . metoprolol tartrate  12.5 mg Oral BID  . pantoprazole  40 mg Oral Daily  . sucralfate  1 g Oral BID AC   Continuous Infusions:    LOS: 6 days   Marylu Lund, MD [facility] Pager On Amion  If 7PM-7AM, please contact night-coverage 06/13/2018, 2:15 PM

## 2018-06-14 LAB — BASIC METABOLIC PANEL
Anion gap: 11 (ref 5–15)
BUN: 18 mg/dL (ref 8–23)
CO2: 39 mmol/L — ABNORMAL HIGH (ref 22–32)
Calcium: 8.5 mg/dL — ABNORMAL LOW (ref 8.9–10.3)
Chloride: 85 mmol/L — ABNORMAL LOW (ref 98–111)
Creatinine, Ser: 0.64 mg/dL (ref 0.61–1.24)
GFR calc Af Amer: >60 mL/min (ref >60)
GFR calc non Af Amer: >60 mL/min (ref >60)
Glucose, Bld: 107 mg/dL — ABNORMAL HIGH (ref 70–99)
Potassium: 3.7 mmol/L (ref 3.5–5.1)
Sodium: 135 mmol/L (ref 135–145)

## 2018-06-14 LAB — MAGNESIUM: Magnesium: 1.3 mg/dL — ABNORMAL LOW (ref 1.7–2.4)

## 2018-06-14 MED ORDER — LABETALOL HCL 5 MG/ML IV SOLN
5.0000 mg | INTRAVENOUS | Status: DC | PRN
  Administered 2018-06-14: 08:00:00 5 mg via INTRAVENOUS
  Filled 2018-06-14: qty 4

## 2018-06-14 MED ORDER — POTASSIUM CHLORIDE CRYS ER 20 MEQ PO TBCR
40.0000 meq | EXTENDED_RELEASE_TABLET | Freq: Once | ORAL | Status: AC
  Administered 2018-06-14: 40 meq via ORAL
  Filled 2018-06-14: qty 2

## 2018-06-14 MED ORDER — SODIUM CHLORIDE 0.9 % IV BOLUS: 250.0000 mL | Freq: Once | INTRAVENOUS | Status: DC

## 2018-06-14 MED ORDER — METOPROLOL TARTRATE 25 MG PO TABS
25.0000 mg | ORAL_TABLET | Freq: Two times a day (BID) | ORAL | Status: DC
  Administered 2018-06-14 – 2018-06-15 (×2): 25 mg via ORAL
  Filled 2018-06-14 (×2): qty 1

## 2018-06-14 MED ORDER — METOPROLOL TARTRATE 12.5 MG HALF TABLET: 12.5000 mg | ORAL_TABLET | Freq: Once | ORAL | Status: DC

## 2018-06-14 NOTE — Progress Notes (Signed)
Pt hr has been 140'150's for last 20 min Dr Sherrian Divers paged  awaiting call back for orders, pt was already in fib

## 2018-06-14 NOTE — Progress Notes (Addendum)
PROGRESS NOTE    Bradley Meadows  FOY:[id number] DOB: Mar 18, 1940 DOA: 06/07/2018 PCP: Susy Frizzle, MD    Brief Narrative:  78 yo with hx of Sq. Cell lung cancer, COPD, ongoing cigarette smoking, chronic hypoxic respiratory failure on home O2, HTN, CAD admitted 06/07/18 after being found down , O2 sats were 60's, troponins are elevated  Echo reveals moderately reduced LV function with EF 35-40%, cardiology recommends medical management, PT recommends SNF rehab.  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 medical management advised, continued on aspirin, metoprolol, Lipitor -Currently continued on IV heparin -Cardiology had been following. Since signed off, recommending medical management -Remains without chest pain  2)Acute on chronic HFrEF - Repeat Echo from 06/07/2018 with EF of 35 to 76% diastolic dysfunction - patient noted to have combined diastolic and systolic dysfunction CHF exacerbation, continued on IV Lasix 40 mg twice daily -Had been tolerating PO lasix with edema resolved  3) COPD -stable, looking cessation advised, continue bronchodilators, no acute exacerbation at this time -Remains on baseline O2, no audible wheezing on exam -Stable  4)Rt Lung SCC Lung Cancer-patient with recent weight loss, follow-up with oncologist post  discharge advised....  -Patient noted to be planned for outpatient PET scan as outpatient for reevaluation of possible lung cancer recurrence soon -Currently stable  5)Chronic hypoxic respiratory failure--- continue to #1 #2 #3 and # 4 above...., at Baseline patient is on 4 L of oxygen via nasal cannula -Currently stable  6)Generalized weakness/Falls -PT recommended SNF -SW following, plan SNF in next 24hrs if stable  7)Afib with RVR -HR in the 150's, given IV labetalol with eventual return to rate control -Will increase metoprolol to 25mg  bid -Given concern of fall risk, will continue on ASA alone for now for secondary stroke prevention. Standing balance reported by PT as "Poor"  DVT prophylaxis: Lovenox subQ Code Status: Full Family Communication: pt in room, family not at bedside Disposition Plan: SNF pending  Consultants:   Cardiology  Procedures:     Antimicrobials: Anti-infectives (From admission, onward)   None      Subjective: Without complaints. Eager to be discharged  Objective: Vitals:   06/14/18 0545 06/14/18 0549 06/14/18 0753 06/14/18 1034  BP: 103/64  106/61 105/69  Pulse: 67 (!) 35 (!) 50   Resp: (!) 22 20 (!) 30   Temp: 98.5 F (36.9 C)  99.1 F (37.3 C)   TempSrc: Oral  Oral   SpO2: 91% 94% 90%   Weight:  66.7 kg    Height:        Intake/Output Summary (Last 24 hours) at 06/14/2018 1516 Last data filed at 06/14/2018 0549 Gross per 24 hour  Intake 390 ml  Output 1060 ml  Net -670 ml   Filed Weights   06/12/18 0623 06/13/18 0643 06/14/18 0549  Weight: 64.4 kg 66.9 kg 66.7 kg  Examination: General exam: Awake, laying in bed, in nad Respiratory system: Normal respiratory effort, no wheezing Cardiovascular system: regular rate, s1, s2 Gastrointestinal system: Soft, nondistended, positive BS Central nervous system: CN2-12 grossly intact, strength intact Extremities: Perfused, no clubbing Skin: Normal skin turgor, no notable skin  lesions seen Psychiatry: Mood normal // no visual hallucinations   Data Reviewed: I have personally reviewed following labs and imaging studies  CBC: Recent Labs  Lab 06/09/18 0306 06/10/18 0528 06/11/18 0209 06/12/18 0245 06/13/18 0419  WBC 8.6 9.7 11.3* 9.9 8.8  HGB 11.0* 11.2* 10.7* 11.0* 11.2*  HCT 32.5* 33.8* 32.2* 33.5* 34.6*  MCV 92.6 94.2 94.4 95.4 95.3  PLT 143* 149* 144* 154 086   Basic Metabolic Panel: Recent Labs  Lab 06/10/18 0528 06/11/18 0209 06/12/18 0245 06/13/18 0419 06/14/18 0359  NA 133* 132* 135 135 135  K 3.3* 3.9 3.9 4.0 3.7  CL 81* 81* 88* 87* 85*  CO2 41* 42* 40* 37* 39*  GLUCOSE 113* 119* 104* 101* 107*  BUN 14 17 17 16 18   CREATININE 0.70 0.70 0.65 0.67 0.64  CALCIUM 8.3* 8.6* 8.6* 8.7* 8.5*  MG  --   --   --   --  1.3*   GFR: Estimated Creatinine Clearance: 71.8 mL/min (by C-G formula based on SCr of 0.64 mg/dL). Liver Function Tests: Recent Labs  Lab 06/08/18 0014  AST 94*  ALT 32  ALKPHOS 56  BILITOT 0.7  PROT 4.7*  ALBUMIN 2.8*   No results for input(s): LIPASE, AMYLASE in the last 168 hours. No results for input(s): AMMONIA in the last 168 hours. Coagulation Profile: Recent Labs  Lab 06/08/18 0014  INR 1.3*   Cardiac Enzymes: Recent Labs  Lab 06/07/18 1830 06/08/18 0014  TROPONINI 3.08* 2.49*   BNP (last 3 results) No results for input(s): PROBNP in the last 8760 hours. HbA1C: No results for input(s): HGBA1C in the last 72 hours. CBG: No results for input(s): GLUCAP in the last 168 hours. Lipid Profile: No results for input(s): CHOL, HDL, LDLCALC, TRIG, CHOLHDL, LDLDIRECT in the last 72 hours. Thyroid Function Tests: No results for input(s): TSH, T4TOTAL, FREET4, T3FREE, THYROIDAB in the last 72 hours. Anemia Panel: No results for input(s): VITAMINB12, FOLATE, FERRITIN, TIBC, IRON, RETICCTPCT in the last 72 hours. Sepsis Labs: No results for input(s): PROCALCITON, LATICACIDVEN in the last 168 hours.   Recent Results (from the past 240 hour(s))  SARS Coronavirus 2 (CEPHEID - Performed in [facility] hospital lab), Hosp Order     Status: None   Collection Time: 06/07/18  3:21 AM  Result Value Ref Range Status   SARS Coronavirus 2 NEGATIVE NEGATIVE Final    Comment: (NOTE) If result is NEGATIVE SARS-CoV-2 target nucleic acids are NOT DETECTED. The SARS-CoV-2 RNA is generally detectable in upper and lower  respiratory specimens during the acute phase of infection. The lowest  concentration of SARS-CoV-2 viral copies this assay can detect is 250  copies / mL. A negative result does not preclude SARS-CoV-2 infection  and should not be used as the sole basis for treatment or other  patient management decisions.  A negative result may occur with  improper specimen collection / handling, submission of specimen other  than nasopharyngeal swab, presence of viral mutation(s) within the  areas targeted by this assay, and inadequate number of viral copies  (<250 copies / mL). A negative result must be combined with clinical  observations, patient history, and epidemiological information. If result is POSITIVE  SARS-CoV-2 target nucleic acids are DETECTED. The SARS-CoV-2 RNA is generally detectable in upper and lower  respiratory specimens dur ing the acute phase of infection.  Positive  results are indicative of active infection with SARS-CoV-2.  Clinical  correlation with patient history and other diagnostic information is  necessary to determine patient infection status.  Positive results do  not rule out bacterial infection or co-infection with other viruses. If result is PRESUMPTIVE POSTIVE SARS-CoV-2 nucleic acids MAY BE PRESENT.   A presumptive positive result was obtained on the submitted specimen  and confirmed on repeat testing.  While 2019 novel coronavirus  (SARS-CoV-2) nucleic acids may be present in the submitted sample  additional confirmatory testing may be necessary for  epidemiological  and / or clinical management purposes  to differentiate between  SARS-CoV-2 and other Sarbecovirus currently known to infect humans.  If clinically indicated additional testing with an alternate test  methodology [phone]) is advised. The SARS-CoV-2 RNA is generally  detectable in upper and lower respiratory sp ecimens during the acute  phase of infection. The expected result is Negative. Fact Sheet for Patients:  StrictlyIdeas.no Fact Sheet for Healthcare Providers: BankingDealers.co.za This test is not yet approved or cleared by the [place] FDA and has been authorized for detection and/or diagnosis of SARS-CoV-2 by FDA under an Emergency Use Authorization (EUA).  This EUA will remain in effect (meaning this test can be used) for the duration of the COVID-19 declaration under Section 564(b)(1) of the Act, 21 U.S.C. section 360bbb-3(b)(1), unless the authorization is terminated or revoked sooner. Performed at [facility], [place] [address], [place], [place] [address]      Radiology Studies: No results found.  Scheduled Meds: . aspirin EC  81 mg Oral Daily  . atorvastatin  40 mg Oral Daily  . enoxaparin (LOVENOX) injection  40 mg Subcutaneous Q24H  . furosemide  80 mg Intravenous Once  . furosemide  40 mg Oral BID  . metoprolol tartrate  12.5 mg Oral Once  . metoprolol tartrate  25 mg Oral BID  . pantoprazole  40 mg Oral Daily  . sucralfate  1 g Oral BID AC   Continuous Infusions:    LOS: 7 days   Marylu Lund, MD [facility] Pager On Amion  If 7PM-7AM, please contact night-coverage 06/14/2018, 3:16 PM

## 2018-06-14 NOTE — Progress Notes (Signed)
Physical Therapy Treatment Patient Details Name: Bradley Meadows MRN: [id number] DOB: 1940-12-26 Today's Date: 06/14/2018    History of Present Illness Bradley Meadows is a 78 y.o. male with medical history significant of CAD, right lung  NCSL stage III, systolic CHF 39%, iron deficiency anemia, GERD, hypertension, active tobacco use, COPD on 4 L nasal cannula, hyperlipidemia was brought to the hospital for evaluation of syncope and hypoxia.    PT Comments    With a good bit of encouragement, pt agreed to participate.  Plan was for standing exercises with sitting rest breaks and ambulation into the hall.  Pt was easily fatigued today and was able to only do 1 standing exercise and some in-room ambulation.  Sats on 6L at 92% and EHR 95-105 bpm.   Follow Up Recommendations  SNF;Supervision/Assistance - 24 hour     Equipment Recommendations  Other (comment)    Recommendations for Other Services       Precautions / Restrictions Precautions Precautions: Fall Precaution Comments: watch O2 Restrictions Weight Bearing Restrictions: No    Mobility  Bed Mobility Overal bed mobility: Needs Assistance Bed Mobility: Supine to Sit     Supine to sit: Min guard(lightly raised HOB)     General bed mobility comments: pt able to get up on his elbows/hands without assist and scoot to EOB given extra time.  Transfers Overall transfer level: Needs assistance Equipment used: Rolling walker (2 wheeled) Transfers: Sit to/from Stand Sit to Stand: Min assist         General transfer comment: assist to come forward and boost.  Cues for hand placement  Ambulation/Gait Ambulation/Gait assistance: Min assist Gait Distance (Feet): 15 Feet(x2 to/from the sink around the foot of the bed.) Assistive device: Rolling walker (2 wheeled) Gait Pattern/deviations: Step-through pattern Gait velocity: dec   General Gait Details: Pt is generally unsteady and with flexed posture.  Sats on 6L [place] hovering  around 92%, but 89% at the worst.  EHR in the 90's and lower 100's with quick return to the 80's once back in the bed.   Stairs             Wheelchair Mobility    Modified Rankin (Stroke Patients Only)       Balance Overall balance assessment: Needs assistance   Sitting balance-Leahy Scale: Fair       Standing balance-Leahy Scale: Poor Standing balance comment: dependent on RW at this time                            Cognition Arousal/Alertness: Awake/alert Behavior During Therapy: WFL for tasks assessed/performed Overall Cognitive Status: Within Functional Limits for tasks assessed                                 General Comments: a bit agitated today      Exercises General Exercises - Lower Extremity Hip Flexion/Marching: AROM;Strengthening;Both;10 reps;Standing    General Comments        Pertinent Vitals/Pain Pain Assessment: No/denies pain    Home Living                      Prior Function            PT Goals (current goals can now be found in the care plan section) Acute Rehab PT Goals Patient Stated Goal: home PT Goal Formulation: With  patient Time For Goal Achievement: 06/22/18 Potential to Achieve Goals: Good Progress towards PT goals: Progressing toward goals    Frequency    Min 3X/week      PT Plan Current plan remains appropriate    Co-evaluation              AM-PAC PT "6 Clicks" Mobility   Outcome Measure  Help needed turning from your back to your side while in a flat bed without using bedrails?: A Little Help needed moving from lying on your back to sitting on the side of a flat bed without using bedrails?: A Little Help needed moving to and from a bed to a chair (including a wheelchair)?: A Little Help needed standing up from a chair using your arms (e.g., wheelchair or bedside chair)?: A Little Help needed to walk in hospital room?: A Little Help needed climbing 3-5 steps with a  railing? : A Lot 6 Click Score: 17    End of Session Equipment Utilized During Treatment: Oxygen Activity Tolerance: Patient limited by fatigue Patient left: in bed;with call bell/phone within reach;with bed alarm set Nurse Communication: Mobility status PT Visit Diagnosis: Unsteadiness on feet (R26.81);Muscle weakness (generalized) (M62.81)     Time: 1235-1310 PT Time Calculation (min) (ACUTE ONLY): 35 min  Charges:  $Gait Training: 8-22 mins $Therapeutic Activity: 8-22 mins                     06/14/2018  Donnella Sham, PT Acute Rehabilitation Services [phone]  (pager) [phone]  (office)   Tessie Fass Demarqus Jocson 06/14/2018, 2:00 PM

## 2018-06-14 NOTE — Progress Notes (Signed)
Pt in afib/rvr since approximately 6:40am, pt asymptomatic, MD paged.  Will continue to monitor.

## 2018-06-14 NOTE — Care Management Important Message (Signed)
Important Message  Patient Details  Name: Bradley Meadows MRN: [id number] Date of Birth: [birth year]   Medicare Important Message Given:  Yes    Memory Argue 06/14/2018, 3:58 PM

## 2018-06-14 NOTE — Progress Notes (Signed)
PRN labetolol and daily toprol given, pt's HR 130-150's, BP 106/61. MD made aware.  Will continue to monitor.

## 2018-06-14 NOTE — Patient Care Conference (Signed)
Called and updated patient's daughter, [name]. All questions answered.

## 2018-06-15 DIAGNOSIS — I2699 Other pulmonary embolism without acute cor pulmonale: Secondary | ICD-10-CM | POA: Diagnosis present

## 2018-06-15 DIAGNOSIS — R918 Other nonspecific abnormal finding of lung field: Secondary | ICD-10-CM | POA: Diagnosis not present

## 2018-06-15 DIAGNOSIS — R5381 Other malaise: Secondary | ICD-10-CM | POA: Diagnosis present

## 2018-06-15 DIAGNOSIS — K219 Gastro-esophageal reflux disease without esophagitis: Secondary | ICD-10-CM | POA: Diagnosis present

## 2018-06-15 DIAGNOSIS — I739 Peripheral vascular disease, unspecified: Secondary | ICD-10-CM | POA: Diagnosis not present

## 2018-06-15 DIAGNOSIS — E871 Hypo-osmolality and hyponatremia: Secondary | ICD-10-CM | POA: Diagnosis present

## 2018-06-15 DIAGNOSIS — I10 Essential (primary) hypertension: Secondary | ICD-10-CM | POA: Diagnosis not present

## 2018-06-15 DIAGNOSIS — M19072 Primary osteoarthritis, left ankle and foot: Secondary | ICD-10-CM | POA: Diagnosis not present

## 2018-06-15 DIAGNOSIS — J449 Chronic obstructive pulmonary disease, unspecified: Secondary | ICD-10-CM | POA: Diagnosis not present

## 2018-06-15 DIAGNOSIS — R2681 Unsteadiness on feet: Secondary | ICD-10-CM | POA: Diagnosis not present

## 2018-06-15 DIAGNOSIS — Z20828 Contact with and (suspected) exposure to other viral communicable diseases: Secondary | ICD-10-CM | POA: Diagnosis present

## 2018-06-15 DIAGNOSIS — I4819 Other persistent atrial fibrillation: Secondary | ICD-10-CM | POA: Diagnosis not present

## 2018-06-15 DIAGNOSIS — M79671 Pain in right foot: Secondary | ICD-10-CM | POA: Diagnosis not present

## 2018-06-15 DIAGNOSIS — I252 Old myocardial infarction: Secondary | ICD-10-CM | POA: Diagnosis not present

## 2018-06-15 DIAGNOSIS — W19XXXA Unspecified fall, initial encounter: Secondary | ICD-10-CM | POA: Diagnosis not present

## 2018-06-15 DIAGNOSIS — M6281 Muscle weakness (generalized): Secondary | ICD-10-CM | POA: Diagnosis not present

## 2018-06-15 DIAGNOSIS — D509 Iron deficiency anemia, unspecified: Secondary | ICD-10-CM | POA: Diagnosis not present

## 2018-06-15 DIAGNOSIS — I4891 Unspecified atrial fibrillation: Secondary | ICD-10-CM | POA: Diagnosis not present

## 2018-06-15 DIAGNOSIS — R05 Cough: Secondary | ICD-10-CM | POA: Diagnosis not present

## 2018-06-15 DIAGNOSIS — C799 Secondary malignant neoplasm of unspecified site: Secondary | ICD-10-CM | POA: Diagnosis not present

## 2018-06-15 DIAGNOSIS — I509 Heart failure, unspecified: Secondary | ICD-10-CM | POA: Diagnosis not present

## 2018-06-15 DIAGNOSIS — J441 Chronic obstructive pulmonary disease with (acute) exacerbation: Secondary | ICD-10-CM | POA: Diagnosis present

## 2018-06-15 DIAGNOSIS — L89810 Pressure ulcer of head, unstageable: Secondary | ICD-10-CM | POA: Diagnosis present

## 2018-06-15 DIAGNOSIS — I491 Atrial premature depolarization: Secondary | ICD-10-CM | POA: Diagnosis not present

## 2018-06-15 DIAGNOSIS — Z7982 Long term (current) use of aspirin: Secondary | ICD-10-CM | POA: Diagnosis not present

## 2018-06-15 DIAGNOSIS — Z87891 Personal history of nicotine dependence: Secondary | ICD-10-CM | POA: Diagnosis not present

## 2018-06-15 DIAGNOSIS — J80 Acute respiratory distress syndrome: Secondary | ICD-10-CM | POA: Diagnosis not present

## 2018-06-15 DIAGNOSIS — J9809 Other diseases of bronchus, not elsewhere classified: Secondary | ICD-10-CM | POA: Diagnosis present

## 2018-06-15 DIAGNOSIS — I5023 Acute on chronic systolic (congestive) heart failure: Secondary | ICD-10-CM | POA: Diagnosis not present

## 2018-06-15 DIAGNOSIS — M19071 Primary osteoarthritis, right ankle and foot: Secondary | ICD-10-CM | POA: Diagnosis not present

## 2018-06-15 DIAGNOSIS — Z720 Tobacco use: Secondary | ICD-10-CM | POA: Diagnosis not present

## 2018-06-15 DIAGNOSIS — J9610 Chronic respiratory failure, unspecified whether with hypoxia or hypercapnia: Secondary | ICD-10-CM | POA: Diagnosis not present

## 2018-06-15 DIAGNOSIS — I5022 Chronic systolic (congestive) heart failure: Secondary | ICD-10-CM | POA: Diagnosis present

## 2018-06-15 DIAGNOSIS — B9561 Methicillin susceptible Staphylococcus aureus infection as the cause of diseases classified elsewhere: Secondary | ICD-10-CM | POA: Diagnosis present

## 2018-06-15 DIAGNOSIS — R404 Transient alteration of awareness: Secondary | ICD-10-CM | POA: Diagnosis not present

## 2018-06-15 DIAGNOSIS — Z66 Do not resuscitate: Secondary | ICD-10-CM | POA: Diagnosis present

## 2018-06-15 DIAGNOSIS — I2510 Atherosclerotic heart disease of native coronary artery without angina pectoris: Secondary | ICD-10-CM | POA: Diagnosis present

## 2018-06-15 DIAGNOSIS — I214 Non-ST elevation (NSTEMI) myocardial infarction: Secondary | ICD-10-CM | POA: Diagnosis not present

## 2018-06-15 DIAGNOSIS — L600 Ingrowing nail: Secondary | ICD-10-CM | POA: Diagnosis not present

## 2018-06-15 DIAGNOSIS — I110 Hypertensive heart disease with heart failure: Secondary | ICD-10-CM | POA: Diagnosis present

## 2018-06-15 DIAGNOSIS — R457 State of emotional shock and stress, unspecified: Secondary | ICD-10-CM | POA: Diagnosis not present

## 2018-06-15 DIAGNOSIS — M2550 Pain in unspecified joint: Secondary | ICD-10-CM | POA: Diagnosis not present

## 2018-06-15 DIAGNOSIS — Z79899 Other long term (current) drug therapy: Secondary | ICD-10-CM | POA: Diagnosis not present

## 2018-06-15 DIAGNOSIS — Z9981 Dependence on supplemental oxygen: Secondary | ICD-10-CM | POA: Diagnosis not present

## 2018-06-15 DIAGNOSIS — R278 Other lack of coordination: Secondary | ICD-10-CM | POA: Diagnosis not present

## 2018-06-15 DIAGNOSIS — J9601 Acute respiratory failure with hypoxia: Secondary | ICD-10-CM | POA: Diagnosis not present

## 2018-06-15 DIAGNOSIS — Z9181 History of falling: Secondary | ICD-10-CM | POA: Diagnosis not present

## 2018-06-15 DIAGNOSIS — Z85118 Personal history of other malignant neoplasm of bronchus and lung: Secondary | ICD-10-CM | POA: Diagnosis not present

## 2018-06-15 DIAGNOSIS — M79676 Pain in unspecified toe(s): Secondary | ICD-10-CM | POA: Diagnosis not present

## 2018-06-15 DIAGNOSIS — R55 Syncope and collapse: Secondary | ICD-10-CM | POA: Diagnosis not present

## 2018-06-15 DIAGNOSIS — Z452 Encounter for adjustment and management of vascular access device: Secondary | ICD-10-CM | POA: Diagnosis not present

## 2018-06-15 DIAGNOSIS — R0602 Shortness of breath: Secondary | ICD-10-CM | POA: Diagnosis not present

## 2018-06-15 DIAGNOSIS — F329 Major depressive disorder, single episode, unspecified: Secondary | ICD-10-CM | POA: Diagnosis not present

## 2018-06-15 DIAGNOSIS — R410 Disorientation, unspecified: Secondary | ICD-10-CM | POA: Diagnosis not present

## 2018-06-15 DIAGNOSIS — J9621 Acute and chronic respiratory failure with hypoxia: Secondary | ICD-10-CM | POA: Diagnosis present

## 2018-06-15 DIAGNOSIS — C3490 Malignant neoplasm of unspecified part of unspecified bronchus or lung: Secondary | ICD-10-CM | POA: Diagnosis not present

## 2018-06-15 DIAGNOSIS — Z515 Encounter for palliative care: Secondary | ICD-10-CM | POA: Diagnosis not present

## 2018-06-15 DIAGNOSIS — R069 Unspecified abnormalities of breathing: Secondary | ICD-10-CM | POA: Diagnosis not present

## 2018-06-15 DIAGNOSIS — K3184 Gastroparesis: Secondary | ICD-10-CM | POA: Diagnosis not present

## 2018-06-15 DIAGNOSIS — J189 Pneumonia, unspecified organism: Secondary | ICD-10-CM | POA: Diagnosis not present

## 2018-06-15 DIAGNOSIS — Z7401 Bed confinement status: Secondary | ICD-10-CM | POA: Diagnosis not present

## 2018-06-15 DIAGNOSIS — T80211A Bloodstream infection due to central venous catheter, initial encounter: Secondary | ICD-10-CM | POA: Diagnosis present

## 2018-06-15 DIAGNOSIS — I480 Paroxysmal atrial fibrillation: Secondary | ICD-10-CM | POA: Diagnosis present

## 2018-06-15 DIAGNOSIS — B351 Tinea unguium: Secondary | ICD-10-CM | POA: Diagnosis not present

## 2018-06-15 DIAGNOSIS — M79674 Pain in right toe(s): Secondary | ICD-10-CM | POA: Diagnosis not present

## 2018-06-15 MED ORDER — METOPROLOL TARTRATE 25 MG PO TABS: 25.0000 mg | ORAL_TABLET | Freq: Two times a day (BID) | ORAL | 0 refills | Status: DC

## 2018-06-15 MED ORDER — BISACODYL 5 MG PO TBEC: 5.0000 mg | DELAYED_RELEASE_TABLET | Freq: Every day | ORAL | 0 refills | Status: AC | PRN

## 2018-06-15 MED ORDER — HYDROCODONE-ACETAMINOPHEN 5-325 MG PO TABS: 1.0000 | ORAL_TABLET | Freq: Four times a day (QID) | ORAL | 0 refills | Status: AC | PRN

## 2018-06-15 MED ORDER — SENNOSIDES-DOCUSATE SODIUM 8.6-50 MG PO TABS: 1.0000 | ORAL_TABLET | Freq: Every evening | ORAL | 0 refills | Status: AC | PRN

## 2018-06-15 MED ORDER — FUROSEMIDE 40 MG PO TABS: 40.0000 mg | ORAL_TABLET | Freq: Two times a day (BID) | ORAL | 0 refills | Status: DC

## 2018-06-15 NOTE — TOC Transition Note (Signed)
Transition of Care [facility]) - CM/SW Discharge Note Marvetta Gibbons RN, BSN Transitions of Care Unit 4E- RN Case Manager [phone]   Patient Details  Name: Bradley Meadows MRN: [id number] Date of Birth: 01/22/1941  Transition of Care [facility]) CM/SW Contact:  [name], RN Phone Number: 06/15/2018, 11:30 AM   Clinical Narrative:    Pt stable for transition to STSNF today, have spoken with Elie Confer at [facility] and confirmed they have bed available and are are able to admit pt today. Daughter has completed paperwork with them and d/c summary to be faxed prior to transport. Pt will transport via [facility].  [facility] contacted at [phone] for transport needs.    Final next level of care: [place] Barriers to Discharge: No Barriers Identified   Patient Goals and CMS Choice Patient states their goals for this hospitalization and ongoing recovery are:: "get back to my house" CMS Medicare.gov Compare Post Acute Care list provided to:: Patient Choice offered to / list presented to : Patient, Adult Children  Discharge Placement   Existing PASRR number confirmed : 06/10/18          Patient chooses bed at: [facility]) Patient to be transferred to facility by: [place] Name of family member notified: [name] Patient and family notified of of transfer: 06/15/18  Discharge Plan and Services In-house Referral: Clinical Social Work, [facility] Discharge Planning Services: CM Consult Post Acute Care Choice: [place]          DME Arranged: N/A DME Agency: NA       HH Arranged: NA [place] Agency: NA        Social Determinants of Health ([place]) Interventions     Readmission Risk Interventions Readmission Risk Prevention Plan 06/15/2018  Transportation Screening Complete  PCP or Specialist Appt within 3-5 Days Complete  HRI or [place] Complete  Social Work Consult for [place] Planning/Counseling Complete  Palliative Care Screening  Not Applicable  Medication Review Press photographer) Complete  Some recent data might be hidden

## 2018-06-15 NOTE — Progress Notes (Signed)
Report given to Chasity at [facility]. All questions were answered. All paperwork given to [facility] transporters. IV and telemetry box removed. Pt left with all of his belongings.   Ara Kussmaul BSN, RN

## 2018-06-15 NOTE — Discharge Summary (Signed)
Physician Discharge Summary  Bradley Meadows SWN:[id number] DOB: 1940/10/16 DOA: 06/07/2018  PCP: Susy Frizzle, MD  Admit date: 06/07/2018 Discharge date: 06/15/2018  Admitted From: Home Disposition:  SNF  Recommendations for Outpatient Follow-up:  1. Follow up with PCP in 1-2 weeks 2. Follow up with Oncology as scheduled  Discharge Condition:Improved CODE STATUS:Full Diet recommendation: Heart healthy   Brief/Interim Summary: 78 yo with hx of Sq. Cell lung cancer, COPD, ongoing cigarette smoking,chronic hypoxic respiratory failure on home O2,HTN,CAD admitted 5/4/20after being found down,O2 sats were 60's,troponins are elevated Echo reveals moderately reduced LV function with EF 35-40%,cardiology recommends medical management, PT recommends SNF rehab.  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 Since signed off, recommending medical management -Remains without chest pain  2)Acute on chronic HFrEF - Repeat Echo from 06/07/2018 with EF of 35 to 93% diastolic dysfunction -patient noted to have combined diastolic and systolic dysfunction CHF exacerbation,continued on IV Lasix 40 mg twice  daily -Had been tolerating PO lasix with edema resolved  3)COPD -stable, looking cessation advised,continue bronchodilators, no acute exacerbation at this time -Remains on baseline O2, no audible wheezing on exam -Stable  4)Rt Lung SCC Lung Cancer-patient with recent weight loss, follow-up with oncologist post discharge advised.... -Patient noted to be planned for outpatient PET scan as outpatient for reevaluation ofpossible lung cancer recurrence soon -Currently stable  5)Chronic hypoxic respiratory failure---continue to #1 #2 #3 and # 4above...., atBaseline patient is on 4 L of oxygen via nasal cannula -Currently stable  6)Generalized weakness/Falls -PT recommended SNF -SW following, plan SNF in next 24hrs if stable  7)Afib with RVR -HR in the 150's, given IV labetalol with eventual return to rate control -Increased metoprolol to 25mg  bid -Given concern of fall risk, will continue on ASA alone for now for secondary stroke prevention. Standing balance reported by PT as "Poor" -Discussed with family, who agrees on holding therapeutic anticoagulation given risk of falls  Discharge Instructions   Allergies as of 06/15/2018      Reactions   No Known Allergies       Medication List    STOP taking these medications   vitamin B-6 250 MG tablet     TAKE these medications   aspirin EC 81 MG tablet Take 81 mg by mouth daily.   atorvastatin 40 MG tablet Commonly known as:  LIPITOR TAKE 1 TABLET(40 MG) BY MOUTH DAILY What changed:    how much to take  how to take this  when to take this  additional instructions   bisacodyl 5 MG EC tablet Commonly known as:  DULCOLAX Take 1 tablet (5 mg total) by mouth daily as needed for moderate constipation.   Carafate 1 GM/10ML suspension Generic drug:  sucralfate TAKE 10 MLS BY MOUTH TWICE DAILY What changed:  See the  new instructions.   furosemide 40 MG tablet Commonly known as:  LASIX Take 1 tablet (40 mg  total) by mouth 2 (two) times daily for 30 days.   HYDROcodone-acetaminophen 5-325 MG tablet Commonly known as:  NORCO/VICODIN Take 1 tablet by mouth every 6 (six) hours as needed for severe pain.   Ipratropium-Albuterol 20-100 MCG/ACT Aers respimat Commonly known as:  Combivent Respimat INHALE 1 PUFF BY MOUTH EVERY 6 HOURS AS NEEDED FOR WHEEZING What changed:    how much to take  how to take this  when to take this  reasons to take this  additional instructions   ipratropium-albuterol 0.5-2.5 (3) MG/3ML Soln Commonly known as:  DUONEB USE 1 VIAL VIA NEBULIZER EVERY 6 HOURS AS NEEDED FOR SHORTNESS OF BREATH OR WHEEZING What changed:  See the new instructions.   meclizine 12.5 MG tablet Commonly known as:  ANTIVERT TAKE 1 TABLET(12.5 MG) BY MOUTH THREE TIMES DAILY AS NEEDED FOR DIZZINESS What changed:  See the new instructions.   metoprolol tartrate 25 MG tablet Commonly known as:  LOPRESSOR Take 1 tablet (25 mg total) by mouth 2 (two) times daily for 30 days.   multivitamin tablet Take 1 tablet by mouth daily. Centrum Silver   omeprazole 20 MG capsule Commonly known as:  PRILOSEC Take 1 capsule (20 mg total) by mouth daily.   senna-docusate 8.6-50 MG tablet Commonly known as:  Senokot-S Take 1 tablet by mouth at bedtime as needed for mild constipation.   Stiolto Respimat 2.5-2.5 MCG/ACT Aers Generic drug:  Tiotropium Bromide-Olodaterol INHALE 2 PUFFS INTO THE LUNGS DAILY What changed:  See the new instructions.   Retail buyer transfer bench       Allergies  Allergen Reactions  . No Known Allergies     Consultations:  Cardiology  Procedures/Studies: Ct Head Wo Contrast  Result Date: 06/07/2018 CLINICAL DATA:  78 y/o M; fall with head and neck trauma. History of lung cancer. EXAM: CT HEAD WITHOUT CONTRAST CT CERVICAL SPINE WITHOUT CONTRAST TECHNIQUE: Multidetector CT imaging of the head and cervical spine was performed following the  standard protocol without intravenous contrast. Multiplanar CT image reconstructions of the cervical spine were also generated. COMPARISON:  12/11/2017 PET-CT. FINDINGS: CT HEAD FINDINGS Brain: No evidence of acute infarction, hemorrhage, hydrocephalus, extra-axial collection or mass lesion/mass effect. Few nonspecific white matter hypodensities are compatible with mild chronic microvascular ischemic changes and there is mild volume loss of the brain. Vascular: Calcific atherosclerosis of carotid siphons. Skull: Normal. Negative for fracture or focal lesion. Sinuses/Orbits: No acute finding. Other: Bilateral intra-ocular lens replacement. CT CERVICAL SPINE FINDINGS Alignment: Mild reversal of cervical curvature with apex at C6. C4-5 and C5-6 grade 1 anterolisthesis. Skull base and vertebrae: No acute fracture. No primary bone lesion or focal pathologic process. Soft tissues and spinal canal: No prevertebral fluid or swelling. No visible canal hematoma. Disc levels: Productive changes of the articulation of anterior arch of C1, odontoid, and basion. Right-greater-than-left facet hypertrophy and multilevel discogenic degenerative changes greatest at C5-C7. Uncovertebral and facet hypertrophy contribute to bony neural foraminal stenosis at the bilateral C2-3, right C3-4, right greater than left C4-5, right greater than left C5-6, bilateral C6-7 levels. No high-grade bony spinal canal stenosis. Upper chest: Centrilobular emphysema. Other: Calcific atherosclerosis of carotid bifurcations. IMPRESSION: 1. No acute intracranial abnormality or calvarial fracture. 2. No acute fracture or dislocation of the cervical spine. 3. Mild chronic microvascular ischemic changes and volume loss of the brain. 4. Moderate cervical spondylosis greatest  at C5-C7 levels. 5.  Emphysema (ICD10-J43.9). Electronically Signed   By: Kristine Garbe M.D.   On: 06/07/2018 01:17   Ct Cervical Spine Wo Contrast  Result Date:  06/07/2018 CLINICAL DATA:  78 y/o M; fall with head and neck trauma. History of lung cancer. EXAM: CT HEAD WITHOUT CONTRAST CT CERVICAL SPINE WITHOUT CONTRAST TECHNIQUE: Multidetector CT imaging of the head and cervical spine was performed following the standard protocol without intravenous contrast. Multiplanar CT image reconstructions of the cervical spine were also generated. COMPARISON:  12/11/2017 PET-CT. FINDINGS: CT HEAD FINDINGS Brain: No evidence of acute infarction, hemorrhage, hydrocephalus, extra-axial collection or mass lesion/mass effect. Few nonspecific white matter hypodensities are compatible with mild chronic microvascular ischemic changes and there is mild volume loss of the brain. Vascular: Calcific atherosclerosis of carotid siphons. Skull: Normal. Negative for fracture or focal lesion. Sinuses/Orbits: No acute finding. Other: Bilateral intra-ocular lens replacement. CT CERVICAL SPINE FINDINGS Alignment: Mild reversal of cervical curvature with apex at C6. C4-5 and C5-6 grade 1 anterolisthesis. Skull base and vertebrae: No acute fracture. No primary bone lesion or focal pathologic process. Soft tissues and spinal canal: No prevertebral fluid or swelling. No visible canal hematoma. Disc levels: Productive changes of the articulation of anterior arch of C1, odontoid, and basion. Right-greater-than-left facet hypertrophy and multilevel discogenic degenerative changes greatest at C5-C7. Uncovertebral and facet hypertrophy contribute to bony neural foraminal stenosis at the bilateral C2-3, right C3-4, right greater than left C4-5, right greater than left C5-6, bilateral C6-7 levels. No high-grade bony spinal canal stenosis. Upper chest: Centrilobular emphysema. Other: Calcific atherosclerosis of carotid bifurcations. IMPRESSION: 1. No acute intracranial abnormality or calvarial fracture. 2. No acute fracture or dislocation of the cervical spine. 3. Mild chronic microvascular ischemic changes and  volume loss of the brain. 4. 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 5.  Emphysema (ICD10-J43.9). Electronically Signed   By: Kristine Garbe M.D.   On: 06/07/2018 01:17   Dg Chest Port 1 View  Result Date: 06/07/2018 CLINICAL DATA:  Weakness, syncope EXAM: PORTABLE CHEST 1 VIEW COMPARISON:  PET CT 12/11/2017 FINDINGS: Right Port-A-Cath in place with the tip in the right atrium. Cardiomegaly with vascular congestion. Interstitial prominence likely reflects interstitial edema. Bibasilar atelectasis or infiltrates. No effusions. No acute bony abnormality. IMPRESSION: Cardiomegaly with vascular congestion and probable interstitial edema. Bibasilar atelectasis or infiltrates. Cannot exclude pneumonia particularly in the right lung base. Electronically Signed   By: Rolm Baptise M.D.   On: 06/07/2018 03:52    Subjective: No chest pain  Discharge Exam: Vitals:   06/14/18 1936 06/15/18 0429  BP: 123/63 103/69  Pulse: 85 (!) 42  Resp: (!) 25 (!) 27  Temp: 99 F (37.2 C) 98.5 F (36.9 C)  SpO2: 93% 94%   Vitals:   06/14/18 1700 06/14/18 1712 06/14/18 1936 06/15/18 0429  BP:  112/63 123/63 103/69  Pulse: 76 82 85 (!) 42  Resp: (!) 37 17 (!) 25 (!) 27  Temp:  99.2 F (37.3 C) 99 F (37.2 C) 98.5 F (36.9 C)  TempSrc:  Oral Oral Oral  SpO2: 96% 96% 93% 94%  Weight:    68.8 kg  Height:        General: Pt is alert, awake, not in acute distress Cardiovascular: RRR, S1/S2 +, no rubs, no gallops Respiratory: CTA bilaterally, no wheezing, no rhonchi Abdominal: Soft, NT, ND, bowel sounds + Extremities: no edema, no cyanosis   The results of significant diagnostics from this hospitalization (including imaging,  microbiology, ancillary and laboratory) are listed below for reference.     Microbiology: Recent Results (from the past 240 hour(s))  SARS Coronavirus 2 (CEPHEID - Performed in [facility] hospital lab), Hosp Order     Status: None   Collection Time: 06/07/18   3:21 AM  Result Value Ref Range Status   SARS Coronavirus 2 NEGATIVE NEGATIVE Final    Comment: (NOTE) If result is NEGATIVE SARS-CoV-2 target nucleic acids are NOT DETECTED. The SARS-CoV-2 RNA is generally detectable in upper and lower  respiratory specimens during the acute phase of infection. The lowest  concentration of SARS-CoV-2 viral copies this assay can detect is 250  copies / mL. A negative result does not preclude SARS-CoV-2 infection  and should not be used as the sole basis for treatment or other  patient management decisions.  A negative result may occur with  improper specimen collection / handling, submission of specimen other  than nasopharyngeal swab, presence of viral mutation(s) within the  areas targeted by this assay, and inadequate number of viral copies  (<250 copies / mL). A negative result must be combined with clinical  observations, patient history, and epidemiological information. If result is POSITIVE SARS-CoV-2 target nucleic acids are DETECTED. The SARS-CoV-2 RNA is generally detectable in upper and lower  respiratory specimens dur ing the acute phase of infection.  Positive  results are indicative of active infection with SARS-CoV-2.  Clinical  correlation with patient history and other diagnostic information is  necessary to determine patient infection status.  Positive results do  not rule out bacterial infection or co-infection with other viruses. If result is PRESUMPTIVE POSTIVE SARS-CoV-2 nucleic acids MAY BE PRESENT.   A presumptive positive result was obtained on the submitted specimen  and confirmed on repeat testing.  While 2019 novel coronavirus  (SARS-CoV-2) nucleic acids may be present in the submitted sample  additional confirmatory testing may be necessary for epidemiological  and / or clinical management purposes  to differentiate between  SARS-CoV-2 and other Sarbecovirus currently known to infect humans.  If clinically indicated  additional testing with an alternate test  methodology [phone]) is advised. The SARS-CoV-2 RNA is generally  detectable in upper and lower respiratory sp ecimens during the acute  phase of infection. The expected result is Negative. Fact Sheet for Patients:  StrictlyIdeas.no Fact Sheet for Healthcare Providers: BankingDealers.co.za This test is not yet approved or cleared by the [place] FDA and has been authorized for detection and/or diagnosis of SARS-CoV-2 by FDA under an Emergency Use Authorization (EUA).  This EUA will remain in effect (meaning this test can be used) for the duration of the COVID-19 declaration under Section 564(b)(1) of the Act, 21 U.S.C. section 360bbb-3(b)(1), unless the authorization is terminated or revoked sooner. Performed at [facility], [place] [address] [place], [place] [address]      Labs: BNP (last 3 results) Recent Labs    06/07/18 0125  BNP 017.7*   Basic Metabolic Panel: Recent Labs  Lab 06/10/18 0528 06/11/18 0209 06/12/18 0245 06/13/18 0419 06/14/18 0359  NA 133* 132* 135 135 135  K 3.3* 3.9 3.9 4.0 3.7  CL 81* 81* 88* 87* 85*  CO2 41* 42* 40* 37* 39*  GLUCOSE 113* 119* 104* 101* 107*  BUN 14 17 17 16 18   CREATININE 0.70 0.70 0.65 0.67 0.64  CALCIUM 8.3* 8.6* 8.6* 8.7* 8.5*  MG  --   --   --   --  1.3*  Liver Function Tests: No results for input(s): AST, ALT, ALKPHOS, BILITOT, PROT, ALBUMIN in the last 168 hours. No results for input(s): LIPASE, AMYLASE in the last 168 hours. No results for input(s): AMMONIA in the last 168 hours. CBC: Recent Labs  Lab 06/09/18 0306 06/10/18 0528 06/11/18 0209 06/12/18 0245 06/13/18 0419  WBC 8.6 9.7 11.3* 9.9 8.8  HGB 11.0* 11.2* 10.7* 11.0* 11.2*  HCT 32.5* 33.8* 32.2* 33.5* 34.6*  MCV 92.6 94.2 94.4 95.4 95.3  PLT 143* 149* 144* 154 162   Cardiac Enzymes: No results for input(s): CKTOTAL, CKMB, CKMBINDEX,  TROPONINI in the last 168 hours. BNP: Invalid input(s): POCBNP CBG: No results for input(s): GLUCAP in the last 168 hours. D-Dimer No results for input(s): DDIMER in the last 72 hours. Hgb A1c No results for input(s): HGBA1C in the last 72 hours. Lipid Profile No results for input(s): CHOL, HDL, LDLCALC, TRIG, CHOLHDL, LDLDIRECT in the last 72 hours. Thyroid function studies No results for input(s): TSH, T4TOTAL, T3FREE, THYROIDAB in the last 72 hours.  Invalid input(s): FREET3 Anemia work up No results for input(s): VITAMINB12, FOLATE, FERRITIN, TIBC, IRON, RETICCTPCT in the last 72 hours. Urinalysis    Component Value Date/Time   COLORURINE YELLOW 06/07/2018 0735   APPEARANCEUR CLEAR 06/07/2018 0735   LABSPEC 1.008 06/07/2018 0735   PHURINE 6.0 06/07/2018 0735   GLUCOSEU NEGATIVE 06/07/2018 0735   HGBUR SMALL (A) 06/07/2018 0735   BILIRUBINUR NEGATIVE 06/07/2018 0735   BILIRUBINUR NEG 07/05/2013 1530   KETONESUR NEGATIVE 06/07/2018 0735   PROTEINUR NEGATIVE 06/07/2018 0735   UROBILINOGEN 0.2 07/05/2013 1530   UROBILINOGEN 1.0 09/21/2008 1311   NITRITE NEGATIVE 06/07/2018 0735   LEUKOCYTESUR NEGATIVE 06/07/2018 0735   Sepsis Labs Invalid input(s): PROCALCITONIN,  WBC,  LACTICIDVEN Microbiology Recent Results (from the past 240 hour(s))  SARS Coronavirus 2 (CEPHEID - Performed in [facility] hospital lab), Hosp Order     Status: None   Collection Time: 06/07/18  3:21 AM  Result Value Ref Range Status   SARS Coronavirus 2 NEGATIVE NEGATIVE Final    Comment: (NOTE) If result is NEGATIVE SARS-CoV-2 target nucleic acids are NOT DETECTED. The SARS-CoV-2 RNA is generally detectable in upper and lower  respiratory specimens during the acute phase of infection. The lowest  concentration of SARS-CoV-2 viral copies this assay can detect is 250  copies / mL. A negative result does not preclude SARS-CoV-2 infection  and should not be used as the sole basis for treatment or other   patient management decisions.  A negative result may occur with  improper specimen collection / handling, submission of specimen other  than nasopharyngeal swab, presence of viral mutation(s) within the  areas targeted by this assay, and inadequate number of viral copies  (<250 copies / mL). A negative result must be combined with clinical  observations, patient history, and epidemiological information. If result is POSITIVE SARS-CoV-2 target nucleic acids are DETECTED. The SARS-CoV-2 RNA is generally detectable in upper and lower  respiratory specimens dur ing the acute phase of infection.  Positive  results are indicative of active infection with SARS-CoV-2.  Clinical  correlation with patient history and other diagnostic information is  necessary to determine patient infection status.  Positive results do  not rule out bacterial infection or co-infection with other viruses. If result is PRESUMPTIVE POSTIVE SARS-CoV-2 nucleic acids MAY BE PRESENT.   A presumptive positive result was obtained on the submitted specimen  and confirmed on repeat testing.  While 2019 novel  coronavirus  (SARS-CoV-2) nucleic acids may be present in the submitted sample  additional confirmatory testing may be necessary for epidemiological  and / or clinical management purposes  to differentiate between  SARS-CoV-2 and other Sarbecovirus currently known to infect humans.  If clinically indicated additional testing with an alternate test  methodology [phone]) is advised. The SARS-CoV-2 RNA is generally  detectable in upper and lower respiratory sp ecimens during the acute  phase of infection. The expected result is Negative. Fact Sheet for Patients:  StrictlyIdeas.no Fact Sheet for Healthcare Providers: BankingDealers.co.za This test is not yet approved or cleared by the [place] FDA and has been authorized for detection and/or diagnosis of SARS-CoV-2  by FDA under an Emergency Use Authorization (EUA).  This EUA will remain in effect (meaning this test can be used) for the duration of the COVID-19 declaration under Section 564(b)(1) of the Act, 21 U.S.C. section 360bbb-3(b)(1), unless the authorization is terminated or revoked sooner. Performed at [facility], [place] [address] [place], [place] [address]    Time spent: 30 min  SIGNED:   Marylu Lund, MD  [facility] 06/15/2018, 9:17 AM  If 7PM-7AM, please contact night-coverage

## 2018-06-15 NOTE — Progress Notes (Addendum)
Pt has bed available today at [facility] [place] to be transported via [place].   Room # [phone] Number for RN to call report- [phone] or [phone]  Daughter [name] has been notified of transfer to rehab today, d/c summary has been faxed to [facility] via epic to Neoma Laming at [phone]

## 2018-06-17 DIAGNOSIS — I5022 Chronic systolic (congestive) heart failure: Secondary | ICD-10-CM | POA: Diagnosis not present

## 2018-06-17 DIAGNOSIS — D509 Iron deficiency anemia, unspecified: Secondary | ICD-10-CM | POA: Diagnosis not present

## 2018-06-17 DIAGNOSIS — I2510 Atherosclerotic heart disease of native coronary artery without angina pectoris: Secondary | ICD-10-CM | POA: Diagnosis not present

## 2018-06-17 DIAGNOSIS — I214 Non-ST elevation (NSTEMI) myocardial infarction: Secondary | ICD-10-CM | POA: Diagnosis not present

## 2018-06-17 DIAGNOSIS — F329 Major depressive disorder, single episode, unspecified: Secondary | ICD-10-CM | POA: Diagnosis not present

## 2018-06-17 DIAGNOSIS — C3490 Malignant neoplasm of unspecified part of unspecified bronchus or lung: Secondary | ICD-10-CM | POA: Diagnosis not present

## 2018-06-17 DIAGNOSIS — I10 Essential (primary) hypertension: Secondary | ICD-10-CM | POA: Diagnosis not present

## 2018-06-21 DIAGNOSIS — I739 Peripheral vascular disease, unspecified: Secondary | ICD-10-CM | POA: Diagnosis not present

## 2018-06-21 DIAGNOSIS — M79676 Pain in unspecified toe(s): Secondary | ICD-10-CM | POA: Diagnosis not present

## 2018-06-21 DIAGNOSIS — B351 Tinea unguium: Secondary | ICD-10-CM | POA: Diagnosis not present

## 2018-06-22 DIAGNOSIS — I5022 Chronic systolic (congestive) heart failure: Secondary | ICD-10-CM | POA: Diagnosis not present

## 2018-06-22 DIAGNOSIS — M79674 Pain in right toe(s): Secondary | ICD-10-CM | POA: Diagnosis not present

## 2018-06-22 DIAGNOSIS — C3490 Malignant neoplasm of unspecified part of unspecified bronchus or lung: Secondary | ICD-10-CM | POA: Diagnosis not present

## 2018-06-22 DIAGNOSIS — I10 Essential (primary) hypertension: Secondary | ICD-10-CM | POA: Diagnosis not present

## 2018-06-22 DIAGNOSIS — D509 Iron deficiency anemia, unspecified: Secondary | ICD-10-CM | POA: Diagnosis not present

## 2018-06-22 DIAGNOSIS — J449 Chronic obstructive pulmonary disease, unspecified: Secondary | ICD-10-CM | POA: Diagnosis not present

## 2018-06-24 DIAGNOSIS — M19072 Primary osteoarthritis, left ankle and foot: Secondary | ICD-10-CM | POA: Diagnosis not present

## 2018-06-24 DIAGNOSIS — M79671 Pain in right foot: Secondary | ICD-10-CM | POA: Diagnosis not present

## 2018-06-24 DIAGNOSIS — L600 Ingrowing nail: Secondary | ICD-10-CM | POA: Diagnosis not present

## 2018-06-24 DIAGNOSIS — M19071 Primary osteoarthritis, right ankle and foot: Secondary | ICD-10-CM | POA: Diagnosis not present

## 2018-06-24 DIAGNOSIS — B351 Tinea unguium: Secondary | ICD-10-CM | POA: Diagnosis not present

## 2018-06-24 DIAGNOSIS — I739 Peripheral vascular disease, unspecified: Secondary | ICD-10-CM | POA: Diagnosis not present

## 2018-06-29 DIAGNOSIS — I4819 Other persistent atrial fibrillation: Secondary | ICD-10-CM | POA: Diagnosis not present

## 2018-06-29 DIAGNOSIS — C3490 Malignant neoplasm of unspecified part of unspecified bronchus or lung: Secondary | ICD-10-CM | POA: Diagnosis not present

## 2018-06-29 DIAGNOSIS — I5022 Chronic systolic (congestive) heart failure: Secondary | ICD-10-CM | POA: Diagnosis not present

## 2018-06-29 DIAGNOSIS — J449 Chronic obstructive pulmonary disease, unspecified: Secondary | ICD-10-CM | POA: Diagnosis not present

## 2018-06-29 DIAGNOSIS — M79674 Pain in right toe(s): Secondary | ICD-10-CM | POA: Diagnosis not present

## 2018-06-29 DIAGNOSIS — D509 Iron deficiency anemia, unspecified: Secondary | ICD-10-CM | POA: Diagnosis not present

## 2018-06-29 DIAGNOSIS — I10 Essential (primary) hypertension: Secondary | ICD-10-CM | POA: Diagnosis not present

## 2018-06-30 ENCOUNTER — Telehealth: Payer: Self-pay | Admitting: *Deleted

## 2018-06-30 NOTE — Telephone Encounter (Signed)
I spoke with Bradley Meadows daughter, stated he's in rehab and once they get him settle she will call to schedule his follow up visit.

## 2018-07-01 DIAGNOSIS — I2699 Other pulmonary embolism without acute cor pulmonale: Secondary | ICD-10-CM | POA: Diagnosis present

## 2018-07-01 DIAGNOSIS — L89810 Pressure ulcer of head, unstageable: Secondary | ICD-10-CM | POA: Diagnosis present

## 2018-07-01 DIAGNOSIS — I2510 Atherosclerotic heart disease of native coronary artery without angina pectoris: Secondary | ICD-10-CM | POA: Diagnosis present

## 2018-07-01 DIAGNOSIS — T80211A Bloodstream infection due to central venous catheter, initial encounter: Secondary | ICD-10-CM | POA: Diagnosis not present

## 2018-07-01 DIAGNOSIS — J9601 Acute respiratory failure with hypoxia: Secondary | ICD-10-CM | POA: Diagnosis not present

## 2018-07-01 DIAGNOSIS — Z87891 Personal history of nicotine dependence: Secondary | ICD-10-CM | POA: Diagnosis not present

## 2018-07-01 DIAGNOSIS — Z66 Do not resuscitate: Secondary | ICD-10-CM | POA: Diagnosis present

## 2018-07-01 DIAGNOSIS — I214 Non-ST elevation (NSTEMI) myocardial infarction: Secondary | ICD-10-CM | POA: Diagnosis not present

## 2018-07-01 DIAGNOSIS — Z452 Encounter for adjustment and management of vascular access device: Secondary | ICD-10-CM | POA: Diagnosis not present

## 2018-07-01 DIAGNOSIS — I519 Heart disease, unspecified: Secondary | ICD-10-CM | POA: Diagnosis not present

## 2018-07-01 DIAGNOSIS — I4891 Unspecified atrial fibrillation: Secondary | ICD-10-CM | POA: Diagnosis not present

## 2018-07-01 DIAGNOSIS — R2689 Other abnormalities of gait and mobility: Secondary | ICD-10-CM | POA: Diagnosis not present

## 2018-07-01 DIAGNOSIS — R2681 Unsteadiness on feet: Secondary | ICD-10-CM | POA: Diagnosis not present

## 2018-07-01 DIAGNOSIS — J449 Chronic obstructive pulmonary disease, unspecified: Secondary | ICD-10-CM | POA: Diagnosis not present

## 2018-07-01 DIAGNOSIS — I491 Atrial premature depolarization: Secondary | ICD-10-CM | POA: Diagnosis not present

## 2018-07-01 DIAGNOSIS — J441 Chronic obstructive pulmonary disease with (acute) exacerbation: Secondary | ICD-10-CM | POA: Diagnosis present

## 2018-07-01 DIAGNOSIS — I10 Essential (primary) hypertension: Secondary | ICD-10-CM | POA: Diagnosis not present

## 2018-07-01 DIAGNOSIS — R069 Unspecified abnormalities of breathing: Secondary | ICD-10-CM | POA: Diagnosis not present

## 2018-07-01 DIAGNOSIS — I480 Paroxysmal atrial fibrillation: Secondary | ICD-10-CM | POA: Diagnosis present

## 2018-07-01 DIAGNOSIS — I5022 Chronic systolic (congestive) heart failure: Secondary | ICD-10-CM | POA: Diagnosis present

## 2018-07-01 DIAGNOSIS — I509 Heart failure, unspecified: Secondary | ICD-10-CM | POA: Diagnosis not present

## 2018-07-01 DIAGNOSIS — B957 Other staphylococcus as the cause of diseases classified elsewhere: Secondary | ICD-10-CM | POA: Diagnosis not present

## 2018-07-01 DIAGNOSIS — I340 Nonrheumatic mitral (valve) insufficiency: Secondary | ICD-10-CM | POA: Diagnosis not present

## 2018-07-01 DIAGNOSIS — T80219D Unspecified infection due to central venous catheter, subsequent encounter: Secondary | ICD-10-CM | POA: Diagnosis not present

## 2018-07-01 DIAGNOSIS — I252 Old myocardial infarction: Secondary | ICD-10-CM | POA: Diagnosis not present

## 2018-07-01 DIAGNOSIS — J9809 Other diseases of bronchus, not elsewhere classified: Secondary | ICD-10-CM | POA: Diagnosis present

## 2018-07-01 DIAGNOSIS — I110 Hypertensive heart disease with heart failure: Secondary | ICD-10-CM | POA: Diagnosis not present

## 2018-07-01 DIAGNOSIS — R05 Cough: Secondary | ICD-10-CM | POA: Diagnosis not present

## 2018-07-01 DIAGNOSIS — M6281 Muscle weakness (generalized): Secondary | ICD-10-CM | POA: Diagnosis not present

## 2018-07-01 DIAGNOSIS — J9611 Chronic respiratory failure with hypoxia: Secondary | ICD-10-CM | POA: Diagnosis not present

## 2018-07-01 DIAGNOSIS — J9621 Acute and chronic respiratory failure with hypoxia: Secondary | ICD-10-CM | POA: Diagnosis not present

## 2018-07-01 DIAGNOSIS — Z515 Encounter for palliative care: Secondary | ICD-10-CM | POA: Diagnosis not present

## 2018-07-01 DIAGNOSIS — Z79899 Other long term (current) drug therapy: Secondary | ICD-10-CM | POA: Diagnosis not present

## 2018-07-01 DIAGNOSIS — Z0189 Encounter for other specified special examinations: Secondary | ICD-10-CM | POA: Diagnosis not present

## 2018-07-01 DIAGNOSIS — L89620 Pressure ulcer of left heel, unstageable: Secondary | ICD-10-CM | POA: Diagnosis not present

## 2018-07-01 DIAGNOSIS — R404 Transient alteration of awareness: Secondary | ICD-10-CM | POA: Diagnosis not present

## 2018-07-01 DIAGNOSIS — R410 Disorientation, unspecified: Secondary | ICD-10-CM | POA: Diagnosis not present

## 2018-07-01 DIAGNOSIS — I4819 Other persistent atrial fibrillation: Secondary | ICD-10-CM | POA: Diagnosis not present

## 2018-07-01 DIAGNOSIS — Z9181 History of falling: Secondary | ICD-10-CM | POA: Diagnosis not present

## 2018-07-01 DIAGNOSIS — J9600 Acute respiratory failure, unspecified whether with hypoxia or hypercapnia: Secondary | ICD-10-CM | POA: Diagnosis not present

## 2018-07-01 DIAGNOSIS — R918 Other nonspecific abnormal finding of lung field: Secondary | ICD-10-CM | POA: Diagnosis not present

## 2018-07-01 DIAGNOSIS — Z20828 Contact with and (suspected) exposure to other viral communicable diseases: Secondary | ICD-10-CM | POA: Diagnosis present

## 2018-07-01 DIAGNOSIS — K219 Gastro-esophageal reflux disease without esophagitis: Secondary | ICD-10-CM | POA: Diagnosis present

## 2018-07-01 DIAGNOSIS — E871 Hypo-osmolality and hyponatremia: Secondary | ICD-10-CM | POA: Diagnosis present

## 2018-07-01 DIAGNOSIS — R5381 Other malaise: Secondary | ICD-10-CM | POA: Diagnosis present

## 2018-07-01 DIAGNOSIS — Z7982 Long term (current) use of aspirin: Secondary | ICD-10-CM | POA: Diagnosis not present

## 2018-07-01 DIAGNOSIS — Z85118 Personal history of other malignant neoplasm of bronchus and lung: Secondary | ICD-10-CM | POA: Diagnosis not present

## 2018-07-01 DIAGNOSIS — J189 Pneumonia, unspecified organism: Secondary | ICD-10-CM | POA: Diagnosis not present

## 2018-07-01 DIAGNOSIS — R0602 Shortness of breath: Secondary | ICD-10-CM | POA: Diagnosis not present

## 2018-07-01 DIAGNOSIS — R7881 Bacteremia: Secondary | ICD-10-CM | POA: Diagnosis not present

## 2018-07-01 DIAGNOSIS — R278 Other lack of coordination: Secondary | ICD-10-CM | POA: Diagnosis not present

## 2018-07-01 DIAGNOSIS — Z9981 Dependence on supplemental oxygen: Secondary | ICD-10-CM | POA: Diagnosis not present

## 2018-07-01 DIAGNOSIS — B9561 Methicillin susceptible Staphylococcus aureus infection as the cause of diseases classified elsewhere: Secondary | ICD-10-CM | POA: Diagnosis present

## 2018-07-01 DIAGNOSIS — J80 Acute respiratory distress syndrome: Secondary | ICD-10-CM | POA: Diagnosis not present

## 2018-07-01 DIAGNOSIS — R1312 Dysphagia, oropharyngeal phase: Secondary | ICD-10-CM | POA: Diagnosis not present

## 2018-07-07 DIAGNOSIS — I2510 Atherosclerotic heart disease of native coronary artery without angina pectoris: Secondary | ICD-10-CM | POA: Diagnosis not present

## 2018-07-07 DIAGNOSIS — J449 Chronic obstructive pulmonary disease, unspecified: Secondary | ICD-10-CM | POA: Diagnosis not present

## 2018-07-07 DIAGNOSIS — T80219D Unspecified infection due to central venous catheter, subsequent encounter: Secondary | ICD-10-CM | POA: Diagnosis not present

## 2018-07-07 DIAGNOSIS — J189 Pneumonia, unspecified organism: Secondary | ICD-10-CM | POA: Diagnosis not present

## 2018-07-07 DIAGNOSIS — R1312 Dysphagia, oropharyngeal phase: Secondary | ICD-10-CM | POA: Diagnosis not present

## 2018-07-07 DIAGNOSIS — L89620 Pressure ulcer of left heel, unstageable: Secondary | ICD-10-CM | POA: Diagnosis not present

## 2018-07-07 DIAGNOSIS — J9611 Chronic respiratory failure with hypoxia: Secondary | ICD-10-CM | POA: Diagnosis not present

## 2018-07-07 DIAGNOSIS — R2681 Unsteadiness on feet: Secondary | ICD-10-CM | POA: Diagnosis not present

## 2018-07-07 DIAGNOSIS — B957 Other staphylococcus as the cause of diseases classified elsewhere: Secondary | ICD-10-CM | POA: Diagnosis not present

## 2018-07-07 DIAGNOSIS — I2699 Other pulmonary embolism without acute cor pulmonale: Secondary | ICD-10-CM | POA: Diagnosis not present

## 2018-07-07 DIAGNOSIS — J9600 Acute respiratory failure, unspecified whether with hypoxia or hypercapnia: Secondary | ICD-10-CM | POA: Diagnosis not present

## 2018-07-07 DIAGNOSIS — I5022 Chronic systolic (congestive) heart failure: Secondary | ICD-10-CM | POA: Diagnosis not present

## 2018-07-07 DIAGNOSIS — I110 Hypertensive heart disease with heart failure: Secondary | ICD-10-CM | POA: Diagnosis not present

## 2018-07-07 DIAGNOSIS — Z9981 Dependence on supplemental oxygen: Secondary | ICD-10-CM | POA: Diagnosis not present

## 2018-07-07 DIAGNOSIS — I10 Essential (primary) hypertension: Secondary | ICD-10-CM | POA: Diagnosis not present

## 2018-07-07 DIAGNOSIS — M6281 Muscle weakness (generalized): Secondary | ICD-10-CM | POA: Diagnosis not present

## 2018-07-07 DIAGNOSIS — R5381 Other malaise: Secondary | ICD-10-CM | POA: Diagnosis not present

## 2018-07-07 DIAGNOSIS — J9621 Acute and chronic respiratory failure with hypoxia: Secondary | ICD-10-CM | POA: Diagnosis not present

## 2018-07-07 DIAGNOSIS — R278 Other lack of coordination: Secondary | ICD-10-CM | POA: Diagnosis not present

## 2018-07-07 DIAGNOSIS — E871 Hypo-osmolality and hyponatremia: Secondary | ICD-10-CM | POA: Diagnosis not present

## 2018-07-07 DIAGNOSIS — J984 Other disorders of lung: Secondary | ICD-10-CM | POA: Diagnosis not present

## 2018-07-07 DIAGNOSIS — R7881 Bacteremia: Secondary | ICD-10-CM | POA: Diagnosis not present

## 2018-07-07 DIAGNOSIS — E441 Mild protein-calorie malnutrition: Secondary | ICD-10-CM | POA: Diagnosis not present

## 2018-07-07 DIAGNOSIS — J9601 Acute respiratory failure with hypoxia: Secondary | ICD-10-CM | POA: Diagnosis not present

## 2018-07-07 DIAGNOSIS — I480 Paroxysmal atrial fibrillation: Secondary | ICD-10-CM | POA: Diagnosis not present

## 2018-07-07 DIAGNOSIS — Z452 Encounter for adjustment and management of vascular access device: Secondary | ICD-10-CM | POA: Diagnosis not present

## 2018-07-07 DIAGNOSIS — R2689 Other abnormalities of gait and mobility: Secondary | ICD-10-CM | POA: Diagnosis not present

## 2018-07-07 DIAGNOSIS — J441 Chronic obstructive pulmonary disease with (acute) exacerbation: Secondary | ICD-10-CM | POA: Diagnosis not present

## 2018-07-08 DIAGNOSIS — I2699 Other pulmonary embolism without acute cor pulmonale: Secondary | ICD-10-CM | POA: Diagnosis not present

## 2018-07-08 DIAGNOSIS — I5022 Chronic systolic (congestive) heart failure: Secondary | ICD-10-CM | POA: Diagnosis not present

## 2018-07-08 DIAGNOSIS — R7881 Bacteremia: Secondary | ICD-10-CM | POA: Diagnosis not present

## 2018-07-08 DIAGNOSIS — J441 Chronic obstructive pulmonary disease with (acute) exacerbation: Secondary | ICD-10-CM | POA: Diagnosis not present

## 2018-07-09 DIAGNOSIS — I5022 Chronic systolic (congestive) heart failure: Secondary | ICD-10-CM | POA: Diagnosis not present

## 2018-07-09 DIAGNOSIS — I2699 Other pulmonary embolism without acute cor pulmonale: Secondary | ICD-10-CM | POA: Diagnosis not present

## 2018-07-09 DIAGNOSIS — J9611 Chronic respiratory failure with hypoxia: Secondary | ICD-10-CM | POA: Diagnosis not present

## 2018-07-09 DIAGNOSIS — J441 Chronic obstructive pulmonary disease with (acute) exacerbation: Secondary | ICD-10-CM | POA: Diagnosis not present

## 2018-07-16 ENCOUNTER — Ambulatory Visit (HOSPITAL_COMMUNITY): Payer: Medicare Other

## 2018-07-22 DIAGNOSIS — E441 Mild protein-calorie malnutrition: Secondary | ICD-10-CM | POA: Diagnosis not present

## 2018-07-22 DIAGNOSIS — R7881 Bacteremia: Secondary | ICD-10-CM | POA: Diagnosis not present

## 2018-07-22 DIAGNOSIS — I5022 Chronic systolic (congestive) heart failure: Secondary | ICD-10-CM | POA: Diagnosis not present

## 2018-07-22 DIAGNOSIS — J441 Chronic obstructive pulmonary disease with (acute) exacerbation: Secondary | ICD-10-CM | POA: Diagnosis not present

## 2018-07-23 ENCOUNTER — Ambulatory Visit: Payer: Medicare Other | Admitting: Hematology & Oncology

## 2018-07-23 ENCOUNTER — Other Ambulatory Visit: Payer: Medicare Other

## 2018-07-23 DIAGNOSIS — I480 Paroxysmal atrial fibrillation: Secondary | ICD-10-CM | POA: Diagnosis not present

## 2018-07-23 DIAGNOSIS — E871 Hypo-osmolality and hyponatremia: Secondary | ICD-10-CM | POA: Diagnosis not present

## 2018-07-23 DIAGNOSIS — I2699 Other pulmonary embolism without acute cor pulmonale: Secondary | ICD-10-CM | POA: Diagnosis not present

## 2018-07-23 DIAGNOSIS — I5022 Chronic systolic (congestive) heart failure: Secondary | ICD-10-CM | POA: Diagnosis not present

## 2018-07-23 DIAGNOSIS — J984 Other disorders of lung: Secondary | ICD-10-CM | POA: Diagnosis not present

## 2018-07-27 DIAGNOSIS — I5022 Chronic systolic (congestive) heart failure: Secondary | ICD-10-CM | POA: Diagnosis not present

## 2018-07-27 DIAGNOSIS — J189 Pneumonia, unspecified organism: Secondary | ICD-10-CM | POA: Diagnosis not present

## 2018-07-27 DIAGNOSIS — J449 Chronic obstructive pulmonary disease, unspecified: Secondary | ICD-10-CM | POA: Diagnosis not present

## 2018-07-27 DIAGNOSIS — E871 Hypo-osmolality and hyponatremia: Secondary | ICD-10-CM | POA: Diagnosis not present

## 2018-07-29 DIAGNOSIS — E871 Hypo-osmolality and hyponatremia: Secondary | ICD-10-CM | POA: Diagnosis not present

## 2018-07-29 DIAGNOSIS — J189 Pneumonia, unspecified organism: Secondary | ICD-10-CM | POA: Diagnosis not present

## 2018-07-29 DIAGNOSIS — J449 Chronic obstructive pulmonary disease, unspecified: Secondary | ICD-10-CM | POA: Diagnosis not present

## 2018-07-29 DIAGNOSIS — I5022 Chronic systolic (congestive) heart failure: Secondary | ICD-10-CM | POA: Diagnosis not present

## 2018-07-31 DIAGNOSIS — E441 Mild protein-calorie malnutrition: Secondary | ICD-10-CM | POA: Diagnosis not present

## 2018-07-31 DIAGNOSIS — I480 Paroxysmal atrial fibrillation: Secondary | ICD-10-CM | POA: Diagnosis not present

## 2018-07-31 DIAGNOSIS — Z9981 Dependence on supplemental oxygen: Secondary | ICD-10-CM | POA: Diagnosis not present

## 2018-07-31 DIAGNOSIS — I2699 Other pulmonary embolism without acute cor pulmonale: Secondary | ICD-10-CM | POA: Diagnosis not present

## 2018-07-31 DIAGNOSIS — Z96653 Presence of artificial knee joint, bilateral: Secondary | ICD-10-CM | POA: Diagnosis not present

## 2018-07-31 DIAGNOSIS — I2510 Atherosclerotic heart disease of native coronary artery without angina pectoris: Secondary | ICD-10-CM | POA: Diagnosis not present

## 2018-07-31 DIAGNOSIS — I5022 Chronic systolic (congestive) heart failure: Secondary | ICD-10-CM | POA: Diagnosis not present

## 2018-07-31 DIAGNOSIS — Z87891 Personal history of nicotine dependence: Secondary | ICD-10-CM | POA: Diagnosis not present

## 2018-07-31 DIAGNOSIS — J9621 Acute and chronic respiratory failure with hypoxia: Secondary | ICD-10-CM | POA: Diagnosis not present

## 2018-07-31 DIAGNOSIS — Z7901 Long term (current) use of anticoagulants: Secondary | ICD-10-CM | POA: Diagnosis not present

## 2018-07-31 DIAGNOSIS — J441 Chronic obstructive pulmonary disease with (acute) exacerbation: Secondary | ICD-10-CM | POA: Diagnosis not present

## 2018-07-31 DIAGNOSIS — Z85118 Personal history of other malignant neoplasm of bronchus and lung: Secondary | ICD-10-CM | POA: Diagnosis not present

## 2018-07-31 DIAGNOSIS — F4321 Adjustment disorder with depressed mood: Secondary | ICD-10-CM | POA: Diagnosis not present

## 2018-07-31 DIAGNOSIS — L89620 Pressure ulcer of left heel, unstageable: Secondary | ICD-10-CM | POA: Diagnosis not present

## 2018-07-31 DIAGNOSIS — B957 Other staphylococcus as the cause of diseases classified elsewhere: Secondary | ICD-10-CM | POA: Diagnosis not present

## 2018-07-31 DIAGNOSIS — Z9181 History of falling: Secondary | ICD-10-CM | POA: Diagnosis not present

## 2018-07-31 DIAGNOSIS — I110 Hypertensive heart disease with heart failure: Secondary | ICD-10-CM | POA: Diagnosis not present

## 2018-07-31 DIAGNOSIS — Z602 Problems related to living alone: Secondary | ICD-10-CM | POA: Diagnosis not present

## 2018-07-31 DIAGNOSIS — Z7982 Long term (current) use of aspirin: Secondary | ICD-10-CM | POA: Diagnosis not present

## 2018-07-31 DIAGNOSIS — I252 Old myocardial infarction: Secondary | ICD-10-CM | POA: Diagnosis not present

## 2018-08-02 DIAGNOSIS — I5022 Chronic systolic (congestive) heart failure: Secondary | ICD-10-CM | POA: Diagnosis not present

## 2018-08-02 DIAGNOSIS — L89620 Pressure ulcer of left heel, unstageable: Secondary | ICD-10-CM | POA: Diagnosis not present

## 2018-08-02 DIAGNOSIS — J9621 Acute and chronic respiratory failure with hypoxia: Secondary | ICD-10-CM | POA: Diagnosis not present

## 2018-08-02 DIAGNOSIS — J441 Chronic obstructive pulmonary disease with (acute) exacerbation: Secondary | ICD-10-CM | POA: Diagnosis not present

## 2018-08-02 DIAGNOSIS — I110 Hypertensive heart disease with heart failure: Secondary | ICD-10-CM | POA: Diagnosis not present

## 2018-08-02 DIAGNOSIS — B957 Other staphylococcus as the cause of diseases classified elsewhere: Secondary | ICD-10-CM | POA: Diagnosis not present

## 2018-08-04 DIAGNOSIS — L89620 Pressure ulcer of left heel, unstageable: Secondary | ICD-10-CM | POA: Diagnosis not present

## 2018-08-04 DIAGNOSIS — I5022 Chronic systolic (congestive) heart failure: Secondary | ICD-10-CM | POA: Diagnosis not present

## 2018-08-04 DIAGNOSIS — J441 Chronic obstructive pulmonary disease with (acute) exacerbation: Secondary | ICD-10-CM | POA: Diagnosis not present

## 2018-08-04 DIAGNOSIS — I110 Hypertensive heart disease with heart failure: Secondary | ICD-10-CM | POA: Diagnosis not present

## 2018-08-04 DIAGNOSIS — B957 Other staphylococcus as the cause of diseases classified elsewhere: Secondary | ICD-10-CM | POA: Diagnosis not present

## 2018-08-04 DIAGNOSIS — J9621 Acute and chronic respiratory failure with hypoxia: Secondary | ICD-10-CM | POA: Diagnosis not present

## 2018-08-05 DIAGNOSIS — J9621 Acute and chronic respiratory failure with hypoxia: Secondary | ICD-10-CM | POA: Diagnosis not present

## 2018-08-05 DIAGNOSIS — B957 Other staphylococcus as the cause of diseases classified elsewhere: Secondary | ICD-10-CM | POA: Diagnosis not present

## 2018-08-05 DIAGNOSIS — J441 Chronic obstructive pulmonary disease with (acute) exacerbation: Secondary | ICD-10-CM | POA: Diagnosis not present

## 2018-08-05 DIAGNOSIS — I5022 Chronic systolic (congestive) heart failure: Secondary | ICD-10-CM | POA: Diagnosis not present

## 2018-08-05 DIAGNOSIS — L89620 Pressure ulcer of left heel, unstageable: Secondary | ICD-10-CM | POA: Diagnosis not present

## 2018-08-05 DIAGNOSIS — I110 Hypertensive heart disease with heart failure: Secondary | ICD-10-CM | POA: Diagnosis not present

## 2018-08-09 DIAGNOSIS — J441 Chronic obstructive pulmonary disease with (acute) exacerbation: Secondary | ICD-10-CM | POA: Diagnosis not present

## 2018-08-09 DIAGNOSIS — L89620 Pressure ulcer of left heel, unstageable: Secondary | ICD-10-CM | POA: Diagnosis not present

## 2018-08-09 DIAGNOSIS — J9621 Acute and chronic respiratory failure with hypoxia: Secondary | ICD-10-CM | POA: Diagnosis not present

## 2018-08-09 DIAGNOSIS — B957 Other staphylococcus as the cause of diseases classified elsewhere: Secondary | ICD-10-CM | POA: Diagnosis not present

## 2018-08-09 DIAGNOSIS — I110 Hypertensive heart disease with heart failure: Secondary | ICD-10-CM | POA: Diagnosis not present

## 2018-08-09 DIAGNOSIS — I5022 Chronic systolic (congestive) heart failure: Secondary | ICD-10-CM | POA: Diagnosis not present

## 2018-08-10 DIAGNOSIS — B957 Other staphylococcus as the cause of diseases classified elsewhere: Secondary | ICD-10-CM | POA: Diagnosis not present

## 2018-08-10 DIAGNOSIS — I5022 Chronic systolic (congestive) heart failure: Secondary | ICD-10-CM | POA: Diagnosis not present

## 2018-08-10 DIAGNOSIS — J9621 Acute and chronic respiratory failure with hypoxia: Secondary | ICD-10-CM | POA: Diagnosis not present

## 2018-08-10 DIAGNOSIS — I110 Hypertensive heart disease with heart failure: Secondary | ICD-10-CM | POA: Diagnosis not present

## 2018-08-10 DIAGNOSIS — L89620 Pressure ulcer of left heel, unstageable: Secondary | ICD-10-CM | POA: Diagnosis not present

## 2018-08-10 DIAGNOSIS — J441 Chronic obstructive pulmonary disease with (acute) exacerbation: Secondary | ICD-10-CM | POA: Diagnosis not present

## 2018-08-11 DIAGNOSIS — B957 Other staphylococcus as the cause of diseases classified elsewhere: Secondary | ICD-10-CM | POA: Diagnosis not present

## 2018-08-11 DIAGNOSIS — L89620 Pressure ulcer of left heel, unstageable: Secondary | ICD-10-CM | POA: Diagnosis not present

## 2018-08-11 DIAGNOSIS — J441 Chronic obstructive pulmonary disease with (acute) exacerbation: Secondary | ICD-10-CM | POA: Diagnosis not present

## 2018-08-11 DIAGNOSIS — I5022 Chronic systolic (congestive) heart failure: Secondary | ICD-10-CM | POA: Diagnosis not present

## 2018-08-11 DIAGNOSIS — I110 Hypertensive heart disease with heart failure: Secondary | ICD-10-CM | POA: Diagnosis not present

## 2018-08-11 DIAGNOSIS — J9621 Acute and chronic respiratory failure with hypoxia: Secondary | ICD-10-CM | POA: Diagnosis not present

## 2018-08-13 ENCOUNTER — Ambulatory Visit (INDEPENDENT_AMBULATORY_CARE_PROVIDER_SITE_OTHER): Payer: Medicare Other | Admitting: Family Medicine

## 2018-08-13 ENCOUNTER — Other Ambulatory Visit: Payer: Self-pay

## 2018-08-13 ENCOUNTER — Inpatient Hospital Stay: Payer: Medicare Other | Admitting: Family Medicine

## 2018-08-13 ENCOUNTER — Encounter: Payer: Self-pay | Admitting: Family Medicine

## 2018-08-13 VITALS — BP 100/70 | HR 78 | Temp 99.0°F | Resp 20 | Ht 72.0 in | Wt 154.0 lb

## 2018-08-13 DIAGNOSIS — I480 Paroxysmal atrial fibrillation: Secondary | ICD-10-CM | POA: Diagnosis not present

## 2018-08-13 DIAGNOSIS — M7989 Other specified soft tissue disorders: Secondary | ICD-10-CM

## 2018-08-13 DIAGNOSIS — I5022 Chronic systolic (congestive) heart failure: Secondary | ICD-10-CM | POA: Diagnosis not present

## 2018-08-13 DIAGNOSIS — Z09 Encounter for follow-up examination after completed treatment for conditions other than malignant neoplasm: Secondary | ICD-10-CM

## 2018-08-13 DIAGNOSIS — J449 Chronic obstructive pulmonary disease, unspecified: Secondary | ICD-10-CM

## 2018-08-13 DIAGNOSIS — I10 Essential (primary) hypertension: Secondary | ICD-10-CM | POA: Diagnosis not present

## 2018-08-13 DIAGNOSIS — Z8711 Personal history of peptic ulcer disease: Secondary | ICD-10-CM | POA: Diagnosis not present

## 2018-08-13 MED ORDER — CARAFATE 1 GM/10ML PO SUSP: ORAL | 6 refills | Status: AC

## 2018-08-13 MED ORDER — FUROSEMIDE 40 MG PO TABS: 40.0000 mg | ORAL_TABLET | Freq: Two times a day (BID) | ORAL | 5 refills | Status: DC

## 2018-08-13 MED ORDER — OMEPRAZOLE 20 MG PO CPDR: 20.0000 mg | DELAYED_RELEASE_CAPSULE | Freq: Every day | ORAL | 3 refills | Status: AC

## 2018-08-13 MED ORDER — COMBIVENT RESPIMAT 20-100 MCG/ACT IN AERS: INHALATION_SPRAY | RESPIRATORY_TRACT | 5 refills | Status: AC

## 2018-08-13 MED ORDER — METOPROLOL TARTRATE 25 MG PO TABS: 25.0000 mg | ORAL_TABLET | Freq: Two times a day (BID) | ORAL | 5 refills | Status: DC

## 2018-08-13 MED ORDER — ATORVASTATIN CALCIUM 40 MG PO TABS: ORAL_TABLET | ORAL | 2 refills | Status: AC

## 2018-08-13 MED ORDER — RIVAROXABAN 20 MG PO TABS: 20.0000 mg | ORAL_TABLET | Freq: Every day | ORAL | 3 refills | Status: AC

## 2018-08-13 MED ORDER — METOPROLOL TARTRATE 25 MG PO TABS: 25.0000 mg | ORAL_TABLET | Freq: Two times a day (BID) | ORAL | 5 refills | Status: AC

## 2018-08-13 NOTE — Addendum Note (Signed)
Addended by: Shary Decamp B on: 08/13/2018 03:43 PM   Modules accepted: Orders
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
## 2018-08-13 NOTE — Addendum Note (Signed)
Addended by: Shary Decamp B on: 08/13/2018 03:46 PM   Modules accepted: Orders

## 2018-08-14 LAB — CBC WITH DIFFERENTIAL/PLATELET
Absolute Monocytes: 633 cells/uL (ref 200–950)
Basophils Absolute: 29 cells/uL (ref 0–200)
Basophils Relative: 0.5 %
Eosinophils Absolute: 108 cells/uL (ref 15–500)
Eosinophils Relative: 1.9 %
HCT: 32.1 % — ABNORMAL LOW (ref 38.5–50.0)
Hemoglobin: 10.9 g/dL — ABNORMAL LOW (ref 13.2–17.1)
Lymphs Abs: 661 cells/uL — ABNORMAL LOW (ref 850–3900)
MCH: 31.9 pg (ref 27.0–33.0)
MCHC: 34 g/dL (ref 32.0–36.0)
MCV: 93.9 fL (ref 80.0–100.0)
MPV: 10.2 fL (ref 7.5–12.5)
Monocytes Relative: 11.1 %
Neutro Abs: 4269 cells/uL (ref 1500–7800)
Neutrophils Relative %: 74.9 %
Platelets: 215 10*3/uL (ref 140–400)
RBC: 3.42 10*6/uL — ABNORMAL LOW (ref 4.20–5.80)
RDW: 13.9 % (ref 11.0–15.0)
Total Lymphocyte: 11.6 %
WBC: 5.7 10*3/uL (ref 3.8–10.8)

## 2018-08-14 LAB — COMPLETE METABOLIC PANEL WITH GFR
AG Ratio: 1.6 (calc) (ref 1.0–2.5)
ALT: 9 U/L (ref 9–46)
AST: 12 U/L (ref 10–35)
Albumin: 3.5 g/dL — ABNORMAL LOW (ref 3.6–5.1)
Alkaline phosphatase (APISO): 67 U/L (ref 35–144)
BUN/Creatinine Ratio: 23 (calc) — ABNORMAL HIGH (ref 6–22)
BUN: 12 mg/dL (ref 7–25)
CO2: 31 mmol/L (ref 20–32)
Calcium: 9 mg/dL (ref 8.6–10.3)
Chloride: 93 mmol/L — ABNORMAL LOW (ref 98–110)
Creat: 0.53 mg/dL — ABNORMAL LOW (ref 0.70–1.18)
GFR, Est African American: 117 mL/min/{1.73_m2} (ref >=60)
GFR, Est Non African American: 101 mL/min/{1.73_m2} (ref >=60)
Globulin: 2.2 g/dL (calc) (ref 1.9–3.7)
Glucose, Bld: 98 mg/dL (ref 65–99)
Potassium: 5 mmol/L (ref 3.5–5.3)
Sodium: 129 mmol/L — ABNORMAL LOW (ref 135–146)
Total Bilirubin: 0.5 mg/dL (ref 0.2–1.2)
Total Protein: 5.7 g/dL — ABNORMAL LOW (ref 6.1–8.1)

## 2018-08-17 DIAGNOSIS — B957 Other staphylococcus as the cause of diseases classified elsewhere: Secondary | ICD-10-CM | POA: Diagnosis not present

## 2018-08-17 DIAGNOSIS — L89620 Pressure ulcer of left heel, unstageable: Secondary | ICD-10-CM | POA: Diagnosis not present

## 2018-08-17 DIAGNOSIS — I5022 Chronic systolic (congestive) heart failure: Secondary | ICD-10-CM | POA: Diagnosis not present

## 2018-08-17 DIAGNOSIS — I110 Hypertensive heart disease with heart failure: Secondary | ICD-10-CM | POA: Diagnosis not present

## 2018-08-17 DIAGNOSIS — J441 Chronic obstructive pulmonary disease with (acute) exacerbation: Secondary | ICD-10-CM | POA: Diagnosis not present

## 2018-08-17 DIAGNOSIS — J9621 Acute and chronic respiratory failure with hypoxia: Secondary | ICD-10-CM | POA: Diagnosis not present

## 2018-08-18 ENCOUNTER — Other Ambulatory Visit: Payer: Self-pay | Admitting: Family Medicine

## 2018-08-18 DIAGNOSIS — J9621 Acute and chronic respiratory failure with hypoxia: Secondary | ICD-10-CM | POA: Diagnosis not present

## 2018-08-18 DIAGNOSIS — L89620 Pressure ulcer of left heel, unstageable: Secondary | ICD-10-CM | POA: Diagnosis not present

## 2018-08-18 DIAGNOSIS — I110 Hypertensive heart disease with heart failure: Secondary | ICD-10-CM | POA: Diagnosis not present

## 2018-08-18 DIAGNOSIS — B957 Other staphylococcus as the cause of diseases classified elsewhere: Secondary | ICD-10-CM | POA: Diagnosis not present

## 2018-08-18 DIAGNOSIS — I5022 Chronic systolic (congestive) heart failure: Secondary | ICD-10-CM | POA: Diagnosis not present

## 2018-08-18 DIAGNOSIS — J441 Chronic obstructive pulmonary disease with (acute) exacerbation: Secondary | ICD-10-CM | POA: Diagnosis not present

## 2018-08-18 DIAGNOSIS — E871 Hypo-osmolality and hyponatremia: Secondary | ICD-10-CM

## 2018-08-19 DIAGNOSIS — B957 Other staphylococcus as the cause of diseases classified elsewhere: Secondary | ICD-10-CM | POA: Diagnosis not present

## 2018-08-19 DIAGNOSIS — I5022 Chronic systolic (congestive) heart failure: Secondary | ICD-10-CM | POA: Diagnosis not present

## 2018-08-19 DIAGNOSIS — L89620 Pressure ulcer of left heel, unstageable: Secondary | ICD-10-CM | POA: Diagnosis not present

## 2018-08-19 DIAGNOSIS — J441 Chronic obstructive pulmonary disease with (acute) exacerbation: Secondary | ICD-10-CM | POA: Diagnosis not present

## 2018-08-19 DIAGNOSIS — I110 Hypertensive heart disease with heart failure: Secondary | ICD-10-CM | POA: Diagnosis not present

## 2018-08-19 DIAGNOSIS — J9621 Acute and chronic respiratory failure with hypoxia: Secondary | ICD-10-CM | POA: Diagnosis not present

## 2018-08-23 ENCOUNTER — Encounter: Payer: Self-pay | Admitting: Family Medicine

## 2018-08-23 ENCOUNTER — Ambulatory Visit (INDEPENDENT_AMBULATORY_CARE_PROVIDER_SITE_OTHER): Payer: Medicare Other | Admitting: Family Medicine

## 2018-08-23 ENCOUNTER — Other Ambulatory Visit: Payer: Self-pay

## 2018-08-23 VITALS — BP 102/50 | HR 72 | Temp 98.8°F | Resp 22 | Ht 72.0 in

## 2018-08-23 DIAGNOSIS — I5022 Chronic systolic (congestive) heart failure: Secondary | ICD-10-CM | POA: Diagnosis not present

## 2018-08-23 DIAGNOSIS — J449 Chronic obstructive pulmonary disease, unspecified: Secondary | ICD-10-CM | POA: Diagnosis not present

## 2018-08-23 DIAGNOSIS — E871 Hypo-osmolality and hyponatremia: Secondary | ICD-10-CM

## 2018-08-23 MED ORDER — FUROSEMIDE 40 MG PO TABS: 40.0000 mg | ORAL_TABLET | Freq: Every day | ORAL | 3 refills | Status: AC

## 2018-08-23 MED ORDER — PREDNISONE 20 MG PO TABS: ORAL_TABLET | ORAL | 0 refills | Status: DC

## 2018-08-23 NOTE — Progress Notes (Signed)
Subjective:    Patient ID: Bradley Meadows, male    DOB: [birth year], 78 y.o.   MRN: [id number]  HPI  Recently admitted to the hospital.  I have copied the DC summary below for my reference: Admit date: 06/07/2018 Discharge date: 06/15/2018  Admitted From: Home Disposition:  SNF  Recommendations for Outpatient Follow-up:  1. Follow up with PCP in 1-2 weeks 2. Follow up with Oncology as scheduled  Discharge Condition:Improved CODE STATUS:Full Diet recommendation: Heart healthy   Brief/Interim Summary: 78 yo with hx of Sq. Cell lung cancer, COPD, ongoing cigarette smoking,chronic hypoxic respiratory failure on home O2,HTN,CAD admitted 5/4/20after being found down,O2 sats were 60's,troponins are elevated Echo reveals moderately reduced LV function with EF 35-40%,cardiology recommends medical management, PT recommends SNF rehab.  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 Since signed off, recommending medical management -Remains without chest pain  2)Acute on chronic HFrEF - Repeat Echo from 06/07/2018 with EF of 35 to 25% diastolic dysfunction -patient noted to have combined diastolic and  systolic dysfunction CHF exacerbation,continued on IV Lasix 40 mg twice daily -Had been tolerating PO lasix with edema resolved  3)COPD -stable, looking cessation advised,continue bronchodilators, no acute exacerbation at this time -Remains on baseline O2, no audible wheezing on exam -Stable  4)Rt Lung [facility] Lung Cancer-patient with recent weight loss, follow-up with oncologist post discharge advised.... -Patient noted to be planned for outpatient PET scan as outpatient for reevaluation ofpossible lung cancer recurrence soon -Currently stable  5)Chronic hypoxic respiratory failure---continue to #1 #2 #3 and # 4above...., atBaseline patient is on 4 L of oxygen via nasal cannula -Currentlystable  6)Generalized weakness/Falls -PT recommended SNF -SW following,plan SNF in next 24hrs if stable  7)Afib with RVR -HR in the 150's, given IV labetalol with eventual return to rate control -Increased metoprolol to 25mg  bid -Given concern of fall risk, will continue on ASA alone for now for secondary stroke prevention. Standing balance reported by PT as "Poor" -Discussed with family, who agrees on holding therapeutic anticoagulation given risk of falls   08/13/18 Patient is here today for follow-up.  Patient is requesting a portable oxygen concentrator.  At the present time he has small oxygen tanks the last 1 hour.  This does not allow him sufficient time to travel all the way to [facility] and see his oncologist.  Furthermore he has a difficult time caring these tanks back and forth around his home or out in public.  He would like something smaller that is more portable.  Specifically he is inquiring as to the liquid oxygen system.  I explained to the patient I am uncertain if his insurance will cover it but we can certainly inquire with his DME.  Since I last saw the patient and he was discharged from the hospital he has  been started on Xarelto 20 mg a day I presume for atrial  fibrillation and stroke prevention.  He is also taking aspirin in addition to this.  Performed a stand up and go test in my office today.  Holding onto the cabinet he is able to pull himself to a stand.  However he is somewhat wobbly on his feet as he stands and has to walk using a cane or a walker.  I am concerned about increased fall risk and the bleeding associated with that.  We discussed this today.  Although Xarelto is superior for stroke there is an increased risk of bleeding.  I question if the combination of the aspirin and the Xarelto puts the patient at increased risk and that the risk outweighs the benefit.  Therefore I recommended stopping the aspirin if he is going continue on the Xarelto.  Patient is not wearing a fall pendent.  His son is currently staying with him however this is not the long-term plan.  He denies any chest pain.  He is in normal sinus rhythm today.  He is wheezing on examination however there is no crackles or rails to suggest pulmonary edema.  There is no swelling in his legs.  At that time, my plan was: Patient is currently on Xarelto.  Given his high fall risk, I have asked the patient to stop aspirin if he is going to continue Xarelto even though the patient has had a non-STEMI recently.  I believe most likely the STEMI was due to cardiac strain.  The patient fell at home without wearing his oxygen.  He was laying in the floor for over an hour with oxygen saturations in the 50s and 60s.  I believe this likely caused the strain that led to his non-STEMI.  Therefore I have recommended that the patient wear oxygen 24/7.  I recommended that he wear a fall pendent.  I will check a CBC to monitor for anemia since starting Xarelto.  I will check his renal function and potassium to ensure that there is no evidence of renal insufficiency on his diuretic.  He does not appear to be fluid overloaded on exam today.  He is in normal sinus rhythm today and his heart rate is  controlled.  08/23/18 Patient was not actually taking his Lasix at his last visit.  He was also found to be hyponatremic with a sodium level of 129.  After fully finally reach the patient, on Friday, the patient stated that his legs were swelling.  We had the patient take an additional dose of Lasix 40 mg a day and then follow-up today.  Today on exam, his right leg is mildly swollen.  There is trace pitting edema in both legs right greater than left.  He also reports increasing shortness of breath.  He has faint bibasilar crackles.  He has diminished airflow in both lungs.  He has expiratory wheezing.  He is on 4 L via nasal cannula at home and oxygen saturations are 92% which is down from his baseline.  He denies any purulent sputum however his family member states that when he cough, they can hear "congestion." Past Medical History:  Diagnosis Date   Arthritis    Asthma    CHF (congestive heart failure) (HCC)    ef=45-50%   COPD (chronic obstructive pulmonary disease) ([place])    home O2   Coronary artery disease    40-50% mLAD, 40% pRCA, 30% mRCA, occluded CX with retrograde filling 06/01/06  cath   GERD (gastroesophageal reflux disease)    ulcers   Goals of care, counseling/discussion 04/07/2016   History of external beam radiation therapy 04/24/16-06/04/16   chest 60 Gy in 30 fractions   Hyperlipidemia    Hypertension    Lung cancer, primary, 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, right ([place]) 04/07/2016   PVC's (premature ventricular contractions)    2017   Squamous cell lung cancer, right ([place]) 04/07/2016   Past Surgical History:  Procedure Laterality Date   ESOPHAGOGASTRODUODENOSCOPY (EGD) WITH PROPOFOL N/A 02/26/2016   Procedure: ESOPHAGOGASTRODUODENOSCOPY (EGD) WITH PROPOFOL;  Surgeon: Irene Shipper, MD;  Location: [facility];  Service: Endoscopy;  Laterality: N/A;   EXPLORATORY LAPAROTOMY     for ulcer  "washed me out with a hose pipe"   EYE SURGERY     cataracts    HERNIA REPAIR     INGUINAL HERNIA REPAIR Left 09/26/2016   Procedure: HERNIA REPAIR INGUINAL LEFT;  Surgeon: Jackolyn Confer, MD;  Location: [facility];  Service: General;  Laterality: Left;  With MESH   INGUINAL HERNIA REPAIR Right 02/26/2017   Procedure: HERNIA REPAIR INGUINAL INCARCERATED WITH MESH;  Surgeon: Excell Seltzer, MD;  Location: [facility];  Service: General;  Laterality: Right;   INSERTION OF MESH Right 02/26/2017   Procedure: INSERTION OF MESH;  Surgeon: Excell Seltzer, MD;  Location: [facility];  Service: General;  Laterality: Right;   IR GENERIC HISTORICAL  04/10/2016   IR US GUIDE VASC ACCESS RIGHT 04/10/2016 Markus Daft, MD [facility]   IR GENERIC HISTORICAL  04/10/2016   IR FLUORO GUIDE PORT INSERTION RIGHT 04/10/2016 Markus Daft, MD [facility]   IR GENERIC HISTORICAL  04/10/2016   IR FLUORO RM 30-60 MIN 04/10/2016 Markus Daft, MD [facility]   IR [place] HISTORICAL  04/18/2016   IR FLUORO RM 30-60 MIN 04/18/2016 Greggory Keen, MD [facility]   JOINT REPLACEMENT     knee both   PORTACATH PLACEMENT Right 04/10/2016   VIDEO BRONCHOSCOPY WITH ENDOBRONCHIAL ULTRASOUND N/A 03/26/2016   Procedure: VIDEO BRONCHOSCOPY WITH ENDOBRONCHIAL ULTRASOUND;  Surgeon: Collene Gobble, MD;  Location: [facility];  Service: Thoracic;  Laterality: N/A;   Current Outpatient Medications on File Prior to Visit  Medication Sig Dispense Refill   atorvastatin (LIPITOR) 40 MG tablet TAKE 1 TABLET(40 MG) BY MOUTH DAILY 90 tablet 2   bisacodyl (DULCOLAX) 5 MG EC tablet Take 1 tablet (5 mg total) by mouth daily as needed for moderate constipation. 30 tablet 0   CARAFATE 1 GM/10ML suspension TAKE 10 MLS BY MOUTH TWICE DAILY 420 mL 6   furosemide (LASIX) 40 MG tablet Take 1 tablet (40 mg total) by mouth 2 (two) times daily. (Patient taking differently: Take 40 mg by mouth daily. PRN swelling) 60 tablet 5   HYDROcodone-acetaminophen (NORCO/VICODIN) 5-325 MG tablet Take 1 tablet by mouth every 6 (six) hours as  needed for severe pain. 5 tablet 0   Ipratropium-Albuterol (COMBIVENT RESPIMAT) 20-100 MCG/ACT AERS respimat INHALE 1 PUFF BY MOUTH EVERY 6 HOURS AS NEEDED FOR WHEEZING 4 g 5   ipratropium-albuterol (DUONEB) 0.5-2.5 (3) MG/3ML SOLN USE 1 VIAL VIA NEBULIZER EVERY 6 HOURS AS NEEDED FOR SHORTNESS OF BREATH OR WHEEZING (Patient taking differently: Inhale 3 mLs into the lungs every 6 (six) hours as needed (SOB, wheezing). ) 1080 mL 0   meclizine (ANTIVERT) 12.5 MG tablet TAKE 1 TABLET(12.5 MG) BY MOUTH THREE TIMES DAILY AS NEEDED FOR DIZZINESS (Patient taking differently: Take 12.5 mg by  mouth 3 (three) times daily as needed for dizziness. ) 60 tablet 0   metoprolol tartrate (LOPRESSOR) 25 MG tablet Take 1 tablet (25 mg total) by mouth 2 (two) times daily. 60 tablet 5   Misc. Devices (TRANSFER BENCH) MISC Shower transfer bench 1 each 0   Multiple Vitamin (MULTIVITAMIN) tablet Take 1 tablet by mouth daily. Centrum Silver     omeprazole (PRILOSEC) 20 MG capsule Take 1 capsule (20 mg total) by mouth daily. 90 capsule 3   rivaroxaban (XARELTO) 20 MG TABS tablet Take 1 tablet (20 mg total) by mouth daily. 90 tablet 3   senna-docusate (SENOKOT-S) 8.6-50 MG tablet Take 1 tablet by mouth at bedtime as needed for mild constipation. 30 tablet 0   STIOLTO RESPIMAT 2.5-2.5 MCG/ACT AERS INHALE 2 PUFFS INTO THE LUNGS DAILY (Patient taking differently: Inhale 2 puffs into the lungs daily. ) 4 g 5   No current facility-administered medications on file prior to visit.    Allergies  Allergen Reactions   No Known Allergies    Social History   Socioeconomic History   Marital status: Divorced    Spouse name: Not on file   Number of children: 3   Years of education: 9   Highest education level: Not on file  Occupational History   Occupation: Retired- Agricultural consultant: [employer] strain: Not on file   Food insecurity    Worry: Not on file    Inability:  Not on Lexicographer needs    Medical: Not on file    Non-medical: Not on file  Tobacco Use   Smoking status: Former Smoker    Packs/day: 1.50    Years: 30.00    Pack years: 45.00    Types: Cigarettes    Quit date: 09/29/2015    Years since quitting: 2.9   Smokeless tobacco: Never Used  Substance and Sexual Activity   Alcohol use: No   Drug use: No   Sexual activity: Not Currently  Lifestyle   Physical activity    Days per week: Not on file    Minutes per session: Not on file   Stress: Not on file  Relationships   Social connections    Talks on phone: Not on file    Gets together: Not on file    Attends religious service: Not on file    Active member of club or organization: Not on file    Attends meetings of clubs or organizations: Not on file    Relationship status: Not on file   Intimate partner violence    Fear of current or ex partner: Not on file    Emotionally abused: Not on file    Physically abused: Not on file    Forced sexual activity: Not on file  Other Topics Concern   Not on file  Social History Narrative   Grew up in [place] area.    Left school after 9th grade. No father and had to work.    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       Health Care POA:    Emergency Contact: sister, Hector Brunswick (h) [phone]   End of Life Plan:    Who lives with you: self   Any pets: Lorrin Goodell   Diet: Pt limits sugars and starches and focuses on vegetables an protein. Pt has lost 50 lbs over several years.   Exercise: Pt has not regular exercise routine but still  does Architect, gardening, and walks dog daily.   Seatbelts: Pt reports wearing seatbelt when in vehicle.   Sun Exposure/Protection: Pt does not use sun protectin.   Hobbies: Racing, TV, gardening, Architect          Review of Systems  All other systems reviewed and are negative.      Objective:   Physical Exam Vitals signs reviewed.  Constitutional:       General: He is not in acute distress.    Appearance: He is cachectic. He is not ill-appearing or toxic-appearing.  Cardiovascular:     Rate and Rhythm: Normal rate and regular rhythm.  Pulmonary:     Effort: Pulmonary effort is normal. No respiratory distress.     Breath sounds: Wheezing and rales present. No rhonchi.  Abdominal:     General: Abdomen is flat. Bowel sounds are normal. There is no distension.     Palpations: Abdomen is soft. There is no mass.     Tenderness: There is no abdominal tenderness.     Hernia: No hernia is present.  Musculoskeletal:     Right lower leg: Edema present.     Left lower leg: Edema present.  Neurological:     Mental Status: He is alert.           Assessment & Plan:  1. Low sodium levels Recheck BMP.  If sodium is less than 130 we may need to institute fluid restriction less than 1200 cc/day for SIADH - BASIC METABOLIC PANEL WITH GFR  2. COPD mixed type [facility]) Patient appears to be having a mild COPD exacerbation with bronchospasm.  I will start a prednisone taper pack.  3. Chronic systolic heart failure ([place]) Patient appears to be fluid overloaded today.  I recommended that he resume Lasix 40 mg every day as a baseline dose.

## 2018-08-24 ENCOUNTER — Other Ambulatory Visit: Payer: Self-pay | Admitting: Family Medicine

## 2018-08-24 DIAGNOSIS — J9621 Acute and chronic respiratory failure with hypoxia: Secondary | ICD-10-CM | POA: Diagnosis not present

## 2018-08-24 DIAGNOSIS — L89620 Pressure ulcer of left heel, unstageable: Secondary | ICD-10-CM | POA: Diagnosis not present

## 2018-08-24 DIAGNOSIS — I110 Hypertensive heart disease with heart failure: Secondary | ICD-10-CM | POA: Diagnosis not present

## 2018-08-24 DIAGNOSIS — I480 Paroxysmal atrial fibrillation: Secondary | ICD-10-CM

## 2018-08-24 DIAGNOSIS — I5022 Chronic systolic (congestive) heart failure: Secondary | ICD-10-CM | POA: Diagnosis not present

## 2018-08-24 DIAGNOSIS — J441 Chronic obstructive pulmonary disease with (acute) exacerbation: Secondary | ICD-10-CM | POA: Diagnosis not present

## 2018-08-24 DIAGNOSIS — B957 Other staphylococcus as the cause of diseases classified elsewhere: Secondary | ICD-10-CM | POA: Diagnosis not present

## 2018-08-24 DIAGNOSIS — E871 Hypo-osmolality and hyponatremia: Secondary | ICD-10-CM

## 2018-08-24 LAB — BASIC METABOLIC PANEL WITH GFR
BUN/Creatinine Ratio: 40 (calc) — ABNORMAL HIGH (ref 6–22)
BUN: 19 mg/dL (ref 7–25)
CO2: 31 mmol/L (ref 20–32)
Calcium: 8.6 mg/dL (ref 8.6–10.3)
Chloride: 93 mmol/L — ABNORMAL LOW (ref 98–110)
Creat: 0.48 mg/dL — ABNORMAL LOW (ref 0.70–1.18)
GFR, Est African American: 122 mL/min/{1.73_m2} (ref >=60)
GFR, Est Non African American: 106 mL/min/{1.73_m2} (ref >=60)
Glucose, Bld: 97 mg/dL (ref 65–99)
Potassium: 4.8 mmol/L (ref 3.5–5.3)
Sodium: 131 mmol/L — ABNORMAL LOW (ref 135–146)

## 2018-08-30 ENCOUNTER — Other Ambulatory Visit: Payer: Self-pay | Admitting: Family Medicine

## 2018-08-30 DIAGNOSIS — Z602 Problems related to living alone: Secondary | ICD-10-CM | POA: Diagnosis not present

## 2018-08-30 DIAGNOSIS — J9621 Acute and chronic respiratory failure with hypoxia: Secondary | ICD-10-CM | POA: Diagnosis not present

## 2018-08-30 DIAGNOSIS — Z96653 Presence of artificial knee joint, bilateral: Secondary | ICD-10-CM | POA: Diagnosis not present

## 2018-08-30 DIAGNOSIS — J441 Chronic obstructive pulmonary disease with (acute) exacerbation: Secondary | ICD-10-CM | POA: Diagnosis not present

## 2018-08-30 DIAGNOSIS — I252 Old myocardial infarction: Secondary | ICD-10-CM | POA: Diagnosis not present

## 2018-08-30 DIAGNOSIS — I2510 Atherosclerotic heart disease of native coronary artery without angina pectoris: Secondary | ICD-10-CM | POA: Diagnosis not present

## 2018-08-30 DIAGNOSIS — F4321 Adjustment disorder with depressed mood: Secondary | ICD-10-CM | POA: Diagnosis not present

## 2018-08-30 DIAGNOSIS — Z7982 Long term (current) use of aspirin: Secondary | ICD-10-CM | POA: Diagnosis not present

## 2018-08-30 DIAGNOSIS — Z87891 Personal history of nicotine dependence: Secondary | ICD-10-CM | POA: Diagnosis not present

## 2018-08-30 DIAGNOSIS — B957 Other staphylococcus as the cause of diseases classified elsewhere: Secondary | ICD-10-CM | POA: Diagnosis not present

## 2018-08-30 DIAGNOSIS — I480 Paroxysmal atrial fibrillation: Secondary | ICD-10-CM | POA: Diagnosis not present

## 2018-08-30 DIAGNOSIS — I110 Hypertensive heart disease with heart failure: Secondary | ICD-10-CM | POA: Diagnosis not present

## 2018-08-30 DIAGNOSIS — Z7901 Long term (current) use of anticoagulants: Secondary | ICD-10-CM | POA: Diagnosis not present

## 2018-08-30 DIAGNOSIS — I5022 Chronic systolic (congestive) heart failure: Secondary | ICD-10-CM | POA: Diagnosis not present

## 2018-08-30 DIAGNOSIS — Z9981 Dependence on supplemental oxygen: Secondary | ICD-10-CM | POA: Diagnosis not present

## 2018-08-30 DIAGNOSIS — L89620 Pressure ulcer of left heel, unstageable: Secondary | ICD-10-CM | POA: Diagnosis not present

## 2018-08-30 DIAGNOSIS — I2699 Other pulmonary embolism without acute cor pulmonale: Secondary | ICD-10-CM | POA: Diagnosis not present

## 2018-08-30 DIAGNOSIS — E441 Mild protein-calorie malnutrition: Secondary | ICD-10-CM | POA: Diagnosis not present

## 2018-08-30 DIAGNOSIS — Z9181 History of falling: Secondary | ICD-10-CM | POA: Diagnosis not present

## 2018-08-30 DIAGNOSIS — Z85118 Personal history of other malignant neoplasm of bronchus and lung: Secondary | ICD-10-CM | POA: Diagnosis not present

## 2018-08-31 ENCOUNTER — Ambulatory Visit (INDEPENDENT_AMBULATORY_CARE_PROVIDER_SITE_OTHER): Payer: Medicare Other | Admitting: Family Medicine

## 2018-08-31 ENCOUNTER — Encounter: Payer: Self-pay | Admitting: Family Medicine

## 2018-08-31 ENCOUNTER — Other Ambulatory Visit: Payer: Self-pay

## 2018-08-31 VITALS — BP 120/60 | HR 70 | Temp 99.5°F | Resp 20 | Ht 72.0 in | Wt 153.0 lb

## 2018-08-31 DIAGNOSIS — E871 Hypo-osmolality and hyponatremia: Secondary | ICD-10-CM

## 2018-08-31 DIAGNOSIS — I5022 Chronic systolic (congestive) heart failure: Secondary | ICD-10-CM | POA: Diagnosis not present

## 2018-08-31 DIAGNOSIS — J449 Chronic obstructive pulmonary disease, unspecified: Secondary | ICD-10-CM | POA: Diagnosis not present

## 2018-08-31 DIAGNOSIS — I480 Paroxysmal atrial fibrillation: Secondary | ICD-10-CM

## 2018-08-31 NOTE — Progress Notes (Signed)
Subjective:    Patient ID: Bradley Meadows, male    DOB: 1940-05-01, 78 y.o.   MRN: [id number]  HPI  Recently admitted to the hospital.  I have copied the DC summary below for my reference: Admit date: 06/07/2018 Discharge date: 06/15/2018  Admitted From: Home Disposition:  SNF  Recommendations for Outpatient Follow-up:  1. Follow up with PCP in 1-2 weeks 2. Follow up with Oncology as scheduled  Discharge Condition:Improved CODE STATUS:Full Diet recommendation: Heart healthy   Brief/Interim Summary: 78 yo with hx of Sq. Cell lung cancer, COPD, ongoing cigarette smoking,chronic hypoxic respiratory failure on home O2,HTN,CAD admitted 5/4/20after being found down,O2 sats were 60's,troponins are elevated Echo reveals moderately reduced LV function with EF 35-40%,cardiology recommends medical management, PT recommends SNF rehab.  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 Since signed off, recommending medical management -Remains without chest pain  2)Acute on chronic HFrEF - Repeat Echo from 06/07/2018 with EF of 35 to 97% diastolic dysfunction -patient noted to have combined diastolic and  systolic dysfunction CHF exacerbation,continued on IV Lasix 40 mg twice daily -Had been tolerating PO lasix with edema resolved  3)COPD -stable, looking cessation advised,continue bronchodilators, no acute exacerbation at this time -Remains on baseline O2, no audible wheezing on exam -Stable  4)Rt Lung [facility] Lung Cancer-patient with recent weight loss, follow-up with oncologist post discharge advised.... -Patient noted to be planned for outpatient PET scan as outpatient for reevaluation ofpossible lung cancer recurrence soon -Currently stable  5)Chronic hypoxic respiratory failure---continue to #1 #2 #3 and # 4above...., atBaseline patient is on 4 L of oxygen via nasal cannula -Currentlystable  6)Generalized weakness/Falls -PT recommended SNF -SW following,plan SNF in next 24hrs if stable  7)Afib with RVR -HR in the 150's, given IV labetalol with eventual return to rate control -Increased metoprolol to 25mg  bid -Given concern of fall risk, will continue on ASA alone for now for secondary stroke prevention. Standing balance reported by PT as "Poor" -Discussed with family, who agrees on holding therapeutic anticoagulation given risk of falls   08/13/18 Patient is here today for follow-up.  Patient is requesting a portable oxygen concentrator.  At the present time he has small oxygen tanks the last 1 hour.  This does not allow him sufficient time to travel all the way to [facility] and see his oncologist.  Furthermore he has a difficult time caring these tanks back and forth around his home or out in public.  He would like something smaller that is more portable.  Specifically he is inquiring as to the liquid oxygen system.  I explained to the patient I am uncertain if his insurance will cover it but we can certainly inquire with his DME.  Since I last saw the patient and he was discharged from the hospital he has  been started on Xarelto 20 mg a day I presume for atrial  fibrillation and stroke prevention.  He is also taking aspirin in addition to this.  Performed a stand up and go test in my office today.  Holding onto the cabinet he is able to pull himself to a stand.  However he is somewhat wobbly on his feet as he stands and has to walk using a cane or a walker.  I am concerned about increased fall risk and the bleeding associated with that.  We discussed this today.  Although Xarelto is superior for stroke there is an increased risk of bleeding.  I question if the combination of the aspirin and the Xarelto puts the patient at increased risk and that the risk outweighs the benefit.  Therefore I recommended stopping the aspirin if he is going continue on the Xarelto.  Patient is not wearing a fall pendent.  His son is currently staying with him however this is not the long-term plan.  He denies any chest pain.  He is in normal sinus rhythm today.  He is wheezing on examination however there is no crackles or rails to suggest pulmonary edema.  There is no swelling in his legs.  At that time, my plan was: Patient is currently on Xarelto.  Given his high fall risk, I have asked the patient to stop aspirin if he is going to continue Xarelto even though the patient has had a non-STEMI recently.  I believe most likely the STEMI was due to cardiac strain.  The patient fell at home without wearing his oxygen.  He was laying in the floor for over an hour with oxygen saturations in the 50s and 60s.  I believe this likely caused the strain that led to his non-STEMI.  Therefore I have recommended that the patient wear oxygen 24/7.  I recommended that he wear a fall pendent.  I will check a CBC to monitor for anemia since starting Xarelto.  I will check his renal function and potassium to ensure that there is no evidence of renal insufficiency on his diuretic.  He does not appear to be fluid overloaded on exam today.  He is in normal sinus rhythm today and his heart rate is  controlled.  08/23/18 Patient was not actually taking his Lasix at his last visit.  He was also found to be hyponatremic with a sodium level of 129.  After fully finally reach the patient, on Friday, the patient stated that his legs were swelling.  We had the patient take an additional dose of Lasix 40 mg a day and then follow-up today.  Today on exam, his right leg is mildly swollen.  There is trace pitting edema in both legs right greater than left.  He also reports increasing shortness of breath.  He has faint bibasilar crackles.  He has diminished airflow in both lungs.  He has expiratory wheezing.  He is on 4 L via nasal cannula at home and oxygen saturations are 92% which is down from his baseline.  He denies any purulent sputum however his family member states that when he cough, they can hear "congestion."  At that time, my plan was: 1. Low sodium levels Recheck BMP.  If sodium is less than 130 we may need to institute fluid restriction less than 1200 cc/day for SIADH - BASIC METABOLIC PANEL WITH GFR  2. COPD mixed type [facility]) Patient appears to be having a mild COPD exacerbation with bronchospasm.  I  will start a prednisone taper pack.  3. Chronic systolic heart failure ([place]) Patient appears to be fluid overloaded today.  I recommended that he resume Lasix 40 mg every day as a baseline dose.  08/31/18 Wt Readings from Last 3 Encounters:  08/31/18 153 lb (69.4 kg)  08/13/18 154 lb (69.9 kg)  06/15/18 151 lb 10.8 oz (68.8 kg)   At the patient's last visit I started him on Lasix 40 mg every day.  His sodium was 131.  Therefore I asked the patient come back in today to check his volume status to avoid dehydration, to also measure his sodium level to avoid worsening hyponatremia, and to monitor his potassium.  However patient misunderstood and has been inadvertently taking 40 mg of Lasix twice a day.  He reports increased urinary frequency.  Is keeping him awake at night.  The edema in both his  legs has completely resolved.  There is no pitting edema in his legs.  His lungs sound much clear and are back to his baseline.  There is no expiratory wheezing.  There are no rhonchorous breath sound or rails.  He still has diminished airflow bilaterally due to his severe oxygen dependent COPD however compared to his previous exam, his lungs sound much better.  Overall he seems to be doing well. 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, right ([place]) 04/07/2016   PVC's (premature ventricular contractions)    2017   Squamous cell lung cancer, right ([place]) 04/07/2016   Past Surgical History:  Procedure Laterality Date   ESOPHAGOGASTRODUODENOSCOPY (EGD) WITH PROPOFOL N/A 02/26/2016   Procedure: ESOPHAGOGASTRODUODENOSCOPY (EGD) WITH PROPOFOL;  Surgeon: Irene Shipper, MD;  Location: [facility];  Service: Endoscopy;  Laterality: N/A;   EXPLORATORY LAPAROTOMY     for ulcer  "washed me out with a hose pipe"   EYE SURGERY     cataracts   HERNIA REPAIR     INGUINAL HERNIA REPAIR Left 09/26/2016   Procedure: HERNIA REPAIR INGUINAL LEFT;  Surgeon: Jackolyn Confer, MD;  Location: [facility];  Service: General;  Laterality: Left;  With MESH   INGUINAL HERNIA REPAIR Right 02/26/2017   Procedure: HERNIA REPAIR INGUINAL INCARCERATED WITH MESH;  Surgeon: Excell Seltzer, MD;  Location: [facility];  Service: General;  Laterality: Right;   INSERTION OF MESH Right  02/26/2017   Procedure: INSERTION OF MESH;  Surgeon: Excell Seltzer, MD;  Location: [facility];  Service: General;  Laterality: Right;   IR GENERIC HISTORICAL  04/10/2016   IR US GUIDE VASC ACCESS RIGHT 04/10/2016 Markus Daft, MD [facility]   IR GENERIC HISTORICAL  04/10/2016   IR FLUORO GUIDE PORT INSERTION RIGHT 04/10/2016 Markus Daft, MD [facility]   IR GENERIC HISTORICAL  04/10/2016   IR FLUORO RM 30-60 MIN 04/10/2016 Markus Daft, MD [facility]   IR GENERIC HISTORICAL  04/18/2016   IR FLUORO RM  30-60 MIN 04/18/2016 Greggory Keen, MD [facility]   JOINT REPLACEMENT     knee both   PORTACATH PLACEMENT Right 04/10/2016   VIDEO BRONCHOSCOPY WITH ENDOBRONCHIAL ULTRASOUND N/A 03/26/2016   Procedure: VIDEO BRONCHOSCOPY WITH ENDOBRONCHIAL ULTRASOUND;  Surgeon: Collene Gobble, MD;  Location: [facility];  Service: Thoracic;  Laterality: N/A;   Current Outpatient Medications on File Prior to Visit  Medication Sig Dispense Refill   atorvastatin (LIPITOR) 40 MG tablet TAKE 1 TABLET(40 MG) BY MOUTH DAILY 90 tablet 2   bisacodyl (DULCOLAX) 5 MG EC tablet Take 1 tablet (5 mg total) by mouth daily as needed for moderate constipation. 30 tablet 0   CARAFATE 1 GM/10ML suspension TAKE 10 MLS BY MOUTH TWICE DAILY 420 mL 6   furosemide (LASIX) 40 MG tablet Take 1 tablet (40 mg total) by mouth daily. 30 tablet 3   HYDROcodone-acetaminophen (NORCO/VICODIN) 5-325 MG tablet Take 1 tablet by mouth every 6 (six) hours as needed for severe pain. 5 tablet 0   Ipratropium-Albuterol (COMBIVENT RESPIMAT) 20-100 MCG/ACT AERS respimat INHALE 1 PUFF BY MOUTH EVERY 6 HOURS AS NEEDED FOR WHEEZING 4 g 5   ipratropium-albuterol (DUONEB) 0.5-2.5 (3) MG/3ML SOLN USE 1 VIAL VIA NEBULIZER EVERY 6 HOURS AS NEEDED FOR SHORTNESS OF BREATH OR WHEEZING (Patient taking differently: Inhale 3 mLs into the lungs every 6 (six) hours as needed (SOB, wheezing). ) 1080 mL 0   meclizine (ANTIVERT) 12.5 MG tablet TAKE 1 TABLET(12.5 MG) BY MOUTH  THREE TIMES DAILY AS NEEDED FOR DIZZINESS (Patient taking differently: Take 12.5 mg by mouth 3 (three) times daily as needed for dizziness. ) 60 tablet 0   metoprolol tartrate (LOPRESSOR) 25 MG tablet Take 1 tablet (25 mg total) by mouth 2 (two) times daily. 60 tablet 5   Misc. Devices (TRANSFER BENCH) MISC Shower transfer bench 1 each 0   Multiple Vitamin (MULTIVITAMIN) tablet Take 1 tablet by mouth daily. Centrum Silver     omeprazole (PRILOSEC) 20 MG capsule Take 1 capsule (20 mg total) by mouth daily. 90 capsule 3   predniSONE (DELTASONE) 20 MG tablet 3 tabs poqday 1-2, 2 tabs poqday 3-4, 1 tab poqday 5-6 12 tablet 0   rivaroxaban (XARELTO) 20 MG TABS tablet Take 1 tablet (20 mg total) by mouth daily. 90 tablet 3   senna-docusate (SENOKOT-S) 8.6-50 MG tablet Take 1 tablet by mouth at bedtime as needed for mild constipation. 30 tablet 0   STIOLTO RESPIMAT 2.5-2.5 MCG/ACT AERS INHALE 2 PUFFS INTO THE LUNGS DAILY (Patient taking differently: Inhale 2 puffs into the lungs daily. ) 4 g 5   No current facility-administered medications on file prior to visit.    Allergies  Allergen Reactions   No Known Allergies    Social History   Socioeconomic History   Marital status: Divorced    Spouse name: Not on file   Number of children: 3   Years of education: 9   Highest education level: Not on file  Occupational History   Occupation: Retired- Agricultural consultant: [place] resource strain: Not on file   Food insecurity    Worry: Not on file    Inability: Not on Lexicographer needs    Medical: Not on file    Non-medical: Not on file  Tobacco Use   Smoking status: Former Smoker    Packs/day: 1.50    Years: 30.00    Pack years: 45.00    Types:  Cigarettes    Quit date: 09/29/2015    Years since quitting: 2.9   Smokeless tobacco: Never Used  Substance and Sexual Activity   Alcohol use: No   Drug use: No   Sexual activity: Not  Currently  Lifestyle   Physical activity    Days per week: Not on file    Minutes per session: Not on file   Stress: Not on file  Relationships   Social connections    Talks on phone: Not on file    Gets together: Not on file    Attends religious service: Not on file    Active member of club or organization: Not on file    Attends meetings of clubs or organizations: Not on file    Relationship status: Not on file   Intimate partner violence    Fear of current or ex partner: Not on file    Emotionally abused: Not on file    Physically abused: Not on file    Forced sexual activity: Not on file  Other Topics Concern   Not on file  Social History Narrative   Grew up in [place] area.    Left school after 9th grade. No father and had to work.    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       Health Care POA:    Emergency Contact: sister, Hector Brunswick (h) [phone]   End of Life Plan:    Who lives with you: self   Any pets: Lorrin Goodell   Diet: Pt limits sugars and starches and focuses on vegetables an protein. Pt has lost 50 lbs over several years.   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   Seatbelts: Pt reports wearing seatbelt when in vehicle.   Sun Exposure/Protection: Pt does not use sun protectin.   Hobbies: Racing, TV, gardening, Architect          Review of Systems  All other systems reviewed and are negative.      Objective:   Physical Exam Vitals signs reviewed.  Constitutional:      General: He is not in acute distress.    Appearance: He is cachectic. He is not ill-appearing or toxic-appearing.  Cardiovascular:     Rate and Rhythm: Normal rate and regular rhythm.  Pulmonary:     Effort: Pulmonary effort is normal. No respiratory distress.     Breath sounds: No wheezing, rhonchi or rales.  Abdominal:     General: Abdomen is flat. Bowel sounds are normal. There is no  distension.     Palpations: Abdomen is soft. There is no mass.     Tenderness: There is no abdominal tenderness.     Hernia: No hernia is present.  Musculoskeletal:     Right lower leg: No edema.     Left lower leg: No edema.  Neurological:     Mental Status: He is alert.           Assessment & Plan:  The primary encounter diagnosis was Low sodium levels. Diagnoses of Paroxysmal atrial fibrillation ([place]), COPD mixed type ([place]), and Chronic systolic heart failure ([place]) were also pertinent to this visit. Patient appears euvolemic.  However I am concerned about dehydration.  I recommended reducing his Lasix to 40 mg once a day.  I will check a BMP to monitor his sodium, potassium, and to check the patient for prerenal azotemia.  If his lab work is normal, I will  maintain the patient on Lasix 40 mg a day to maintain euvolemia.  His wheezing has improved having completed the prednisone.  There is no apparent indication today for any antibiotics or additional steroids.  If lab work is normal, I have recommended that the patient follow-up as needed in 3 months or sooner if problems arise.

## 2018-09-01 DIAGNOSIS — I110 Hypertensive heart disease with heart failure: Secondary | ICD-10-CM | POA: Diagnosis not present

## 2018-09-01 DIAGNOSIS — J9621 Acute and chronic respiratory failure with hypoxia: Secondary | ICD-10-CM | POA: Diagnosis not present

## 2018-09-01 DIAGNOSIS — J441 Chronic obstructive pulmonary disease with (acute) exacerbation: Secondary | ICD-10-CM | POA: Diagnosis not present

## 2018-09-01 DIAGNOSIS — I5022 Chronic systolic (congestive) heart failure: Secondary | ICD-10-CM | POA: Diagnosis not present

## 2018-09-01 DIAGNOSIS — B957 Other staphylococcus as the cause of diseases classified elsewhere: Secondary | ICD-10-CM | POA: Diagnosis not present

## 2018-09-01 DIAGNOSIS — L89620 Pressure ulcer of left heel, unstageable: Secondary | ICD-10-CM | POA: Diagnosis not present

## 2018-09-01 LAB — BASIC METABOLIC PANEL WITH GFR
BUN/Creatinine Ratio: 49 (calc) — ABNORMAL HIGH (ref 6–22)
BUN: 30 mg/dL — ABNORMAL HIGH (ref 7–25)
CO2: 29 mmol/L (ref 20–32)
Calcium: 8.9 mg/dL (ref 8.6–10.3)
Chloride: 94 mmol/L — ABNORMAL LOW (ref 98–110)
Creat: 0.61 mg/dL — ABNORMAL LOW (ref 0.70–1.18)
GFR, Est African American: 111 mL/min/{1.73_m2} (ref >=60)
GFR, Est Non African American: 96 mL/min/{1.73_m2} (ref >=60)
Glucose, Bld: 113 mg/dL — ABNORMAL HIGH (ref 65–99)
Potassium: 3.8 mmol/L (ref 3.5–5.3)
Sodium: 134 mmol/L — ABNORMAL LOW (ref 135–146)

## 2018-09-01 LAB — CBC WITH DIFFERENTIAL/PLATELET
Absolute Monocytes: 869 cells/uL (ref 200–950)
Basophils Absolute: 60 cells/uL (ref 0–200)
Basophils Relative: 0.7 %
Eosinophils Absolute: 163 cells/uL (ref 15–500)
Eosinophils Relative: 1.9 %
HCT: 33.7 % — ABNORMAL LOW (ref 38.5–50.0)
Hemoglobin: 11.3 g/dL — ABNORMAL LOW (ref 13.2–17.1)
Lymphs Abs: 679 cells/uL — ABNORMAL LOW (ref 850–3900)
MCH: 32.2 pg (ref 27.0–33.0)
MCHC: 33.5 g/dL (ref 32.0–36.0)
MCV: 96 fL (ref 80.0–100.0)
MPV: 10.6 fL (ref 7.5–12.5)
Monocytes Relative: 10.1 %
Neutro Abs: 6828 cells/uL (ref 1500–7800)
Neutrophils Relative %: 79.4 %
Platelets: 221 10*3/uL (ref 140–400)
RBC: 3.51 10*6/uL — ABNORMAL LOW (ref 4.20–5.80)
RDW: 13.8 % (ref 11.0–15.0)
Total Lymphocyte: 7.9 %
WBC: 8.6 10*3/uL (ref 3.8–10.8)

## 2018-09-03 DIAGNOSIS — L89620 Pressure ulcer of left heel, unstageable: Secondary | ICD-10-CM | POA: Diagnosis not present

## 2018-09-03 DIAGNOSIS — I5022 Chronic systolic (congestive) heart failure: Secondary | ICD-10-CM | POA: Diagnosis not present

## 2018-09-03 DIAGNOSIS — I110 Hypertensive heart disease with heart failure: Secondary | ICD-10-CM | POA: Diagnosis not present

## 2018-09-03 DIAGNOSIS — J9621 Acute and chronic respiratory failure with hypoxia: Secondary | ICD-10-CM | POA: Diagnosis not present

## 2018-09-03 DIAGNOSIS — B957 Other staphylococcus as the cause of diseases classified elsewhere: Secondary | ICD-10-CM | POA: Diagnosis not present

## 2018-09-03 DIAGNOSIS — J441 Chronic obstructive pulmonary disease with (acute) exacerbation: Secondary | ICD-10-CM | POA: Diagnosis not present

## 2018-09-07 ENCOUNTER — Other Ambulatory Visit: Payer: Self-pay

## 2018-09-07 DIAGNOSIS — K4030 Unilateral inguinal hernia, with obstruction, without gangrene, not specified as recurrent: Secondary | ICD-10-CM

## 2018-09-07 MED ORDER — HYDROCODONE-ACETAMINOPHEN 5-325 MG PO TABS: 1.0000 | ORAL_TABLET | Freq: Four times a day (QID) | ORAL | 0 refills | Status: AC | PRN

## 2018-09-07 NOTE — Telephone Encounter (Signed)
Patient is requesting a refill on Hydrocodone   LOV: 08/31/18  LRF:  05/20/18 From [place].

## 2018-09-07 NOTE — Telephone Encounter (Signed)
Requested Prescriptions   Pending Prescriptions Disp Refills  . HYDROcodone-acetaminophen (NORCO/VICODIN) 5-325 MG tablet 5 tablet 0    Sig: Take 1 tablet by mouth every 6 (six) hours as needed for severe pain.    Last OV 08/31/2018  Last written 06/15/2018

## 2018-09-09 DIAGNOSIS — L89620 Pressure ulcer of left heel, unstageable: Secondary | ICD-10-CM | POA: Diagnosis not present

## 2018-09-09 DIAGNOSIS — J9621 Acute and chronic respiratory failure with hypoxia: Secondary | ICD-10-CM | POA: Diagnosis not present

## 2018-09-09 DIAGNOSIS — B957 Other staphylococcus as the cause of diseases classified elsewhere: Secondary | ICD-10-CM | POA: Diagnosis not present

## 2018-09-09 DIAGNOSIS — J441 Chronic obstructive pulmonary disease with (acute) exacerbation: Secondary | ICD-10-CM | POA: Diagnosis not present

## 2018-09-09 DIAGNOSIS — I110 Hypertensive heart disease with heart failure: Secondary | ICD-10-CM | POA: Diagnosis not present

## 2018-09-09 DIAGNOSIS — I5022 Chronic systolic (congestive) heart failure: Secondary | ICD-10-CM | POA: Diagnosis not present

## 2018-09-14 DIAGNOSIS — J441 Chronic obstructive pulmonary disease with (acute) exacerbation: Secondary | ICD-10-CM | POA: Diagnosis not present

## 2018-09-14 DIAGNOSIS — B957 Other staphylococcus as the cause of diseases classified elsewhere: Secondary | ICD-10-CM | POA: Diagnosis not present

## 2018-09-14 DIAGNOSIS — L89620 Pressure ulcer of left heel, unstageable: Secondary | ICD-10-CM | POA: Diagnosis not present

## 2018-09-14 DIAGNOSIS — I5022 Chronic systolic (congestive) heart failure: Secondary | ICD-10-CM | POA: Diagnosis not present

## 2018-09-14 DIAGNOSIS — J9621 Acute and chronic respiratory failure with hypoxia: Secondary | ICD-10-CM | POA: Diagnosis not present

## 2018-09-14 DIAGNOSIS — I110 Hypertensive heart disease with heart failure: Secondary | ICD-10-CM | POA: Diagnosis not present

## 2018-09-15 DIAGNOSIS — J441 Chronic obstructive pulmonary disease with (acute) exacerbation: Secondary | ICD-10-CM | POA: Diagnosis not present

## 2018-09-15 DIAGNOSIS — L89620 Pressure ulcer of left heel, unstageable: Secondary | ICD-10-CM | POA: Diagnosis not present

## 2018-09-15 DIAGNOSIS — I110 Hypertensive heart disease with heart failure: Secondary | ICD-10-CM | POA: Diagnosis not present

## 2018-09-15 DIAGNOSIS — I5022 Chronic systolic (congestive) heart failure: Secondary | ICD-10-CM | POA: Diagnosis not present

## 2018-09-15 DIAGNOSIS — J9621 Acute and chronic respiratory failure with hypoxia: Secondary | ICD-10-CM | POA: Diagnosis not present

## 2018-09-15 DIAGNOSIS — B957 Other staphylococcus as the cause of diseases classified elsewhere: Secondary | ICD-10-CM | POA: Diagnosis not present

## 2018-09-28 DIAGNOSIS — I5022 Chronic systolic (congestive) heart failure: Secondary | ICD-10-CM | POA: Diagnosis not present

## 2018-09-28 DIAGNOSIS — L89620 Pressure ulcer of left heel, unstageable: Secondary | ICD-10-CM | POA: Diagnosis not present

## 2018-09-28 DIAGNOSIS — J441 Chronic obstructive pulmonary disease with (acute) exacerbation: Secondary | ICD-10-CM | POA: Diagnosis not present

## 2018-09-28 DIAGNOSIS — B957 Other staphylococcus as the cause of diseases classified elsewhere: Secondary | ICD-10-CM | POA: Diagnosis not present

## 2018-09-28 DIAGNOSIS — J9621 Acute and chronic respiratory failure with hypoxia: Secondary | ICD-10-CM | POA: Diagnosis not present

## 2018-09-28 DIAGNOSIS — I110 Hypertensive heart disease with heart failure: Secondary | ICD-10-CM | POA: Diagnosis not present

## 2018-10-20 ENCOUNTER — Other Ambulatory Visit: Payer: Self-pay

## 2018-10-20 ENCOUNTER — Ambulatory Visit (INDEPENDENT_AMBULATORY_CARE_PROVIDER_SITE_OTHER): Payer: Medicare Other | Admitting: Family Medicine

## 2018-10-20 DIAGNOSIS — Z23 Encounter for immunization: Secondary | ICD-10-CM

## 2019-01-04 DEATH — deceased

## 2019-03-28 IMAGING — CT NM PET TUM IMG RESTAG (PS) SKULL BASE T - THIGH
1 of 8 series · 2 of 25 positions shown · non-contrast
Comparison: None.

CLINICAL DATA: Subsequent treatment strategy for squamous cell
carcinoma of the lung..

EXAM:
NUCLEAR MEDICINE PET SKULL BASE TO THIGH
TECHNIQUE: 8.2 mCi F-18 FDG was injected intravenously. Full-ring PET imaging
was performed from the skull base to thigh after the radiotracer. CT
data was obtained and used for attenuation correction and anatomic
localization.
Fasting blood glucose: 97 mg/dl

[Series 2: ct sk_thigh 5.0 b31f · axial · 5.0mm · 0.98mm/px · z∈[-950,-70]mm · 2 of 221 slices shown]
[im 1/221  brain]
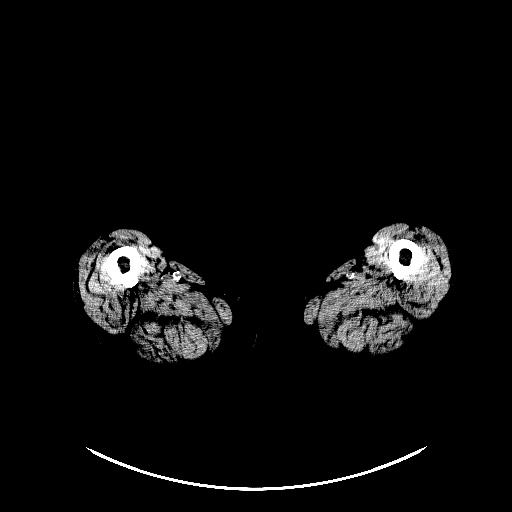
[im 221/221  brain]
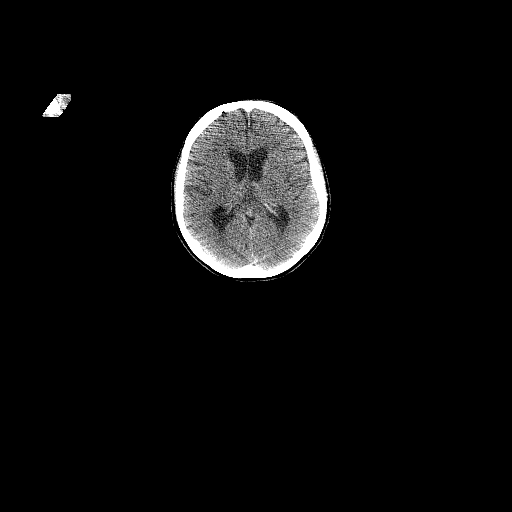

[2 of 25 positions shown; findings below may reference images not displayed]

FINDINGS: Mediastinal blood pool activity: SUV max

NECK: No hypermetabolic lymph nodes in the neck.

Incidental CT findings: none

CHEST: No hypermetabolic mediastinal or hilar nodes. No suspicious
pulmonary nodules on the CT scan.

Interval decrease in size of pulmonary opacities. 4 mm LEFT upper
lobe nodule decreased from 10 mm on PET-CT 03/18/2017. Near complete
resolution of the ill-defined nodular opacity in the superior
segment LEFT lower lobe. Improved ground-glass nodularity in the
RIGHT upper lobe. No residual hypermetabolic activity.

Incidental CT findings: Coronary artery calcification and aortic
atherosclerotic calcification. Fluid-filled esophagus Port in the
anterior chest wall with tip in distal SVC.

ABDOMEN/PELVIS: No abnormal hypermetabolic activity within the
liver, pancreas, adrenal glands, or spleen. No hypermetabolic lymph
nodes in the abdomen or pelvis.

Incidental CT findings: Gallstone. Atherosclerotic calcification of
the aorta.. Prostate normal.

SKELETON: No focal hypermetabolic activity to suggest skeletal
metastasis.

Incidental CT findings: none
IMPRESSION: 1. Improvement in bilateral pulmonary nodules and opacities
consistent with improved pulmonary infectious or inflammatory
process.
2. No evidence of lung cancer recurrence or metastasis.

## 2020-06-21 ENCOUNTER — Telehealth: Payer: Self-pay | Admitting: Family Medicine

## 2020-06-21 NOTE — Telephone Encounter (Signed)
No answer unable to leave a message for patient to call back and schedule Medicare Annual Wellness Visit (AWV) in office.   If not able to come in office, please offer to do virtually or by telephone.   Last AWV: 09/12/2011  Please schedule at anytime with [facility]-Nurse Health Advisor.  If any questions, please contact me at [phone]
